# Patient Record
Sex: Female | Born: 1945 | Race: Black or African American | Hispanic: No | Marital: Married | Smoking: Former smoker
Health system: Southern US, Community
[De-identification: ages and names within clinical notes are randomized; demographics above are authoritative.]

## PROBLEM LIST (undated history)

## (undated) DIAGNOSIS — R17 Unspecified jaundice: Secondary | ICD-10-CM

## (undated) DIAGNOSIS — C250 Malignant neoplasm of head of pancreas: Secondary | ICD-10-CM

## (undated) DIAGNOSIS — C801 Malignant (primary) neoplasm, unspecified: Secondary | ICD-10-CM

## (undated) DIAGNOSIS — I10 Essential (primary) hypertension: Secondary | ICD-10-CM

## (undated) DIAGNOSIS — K219 Gastro-esophageal reflux disease without esophagitis: Secondary | ICD-10-CM

## (undated) DIAGNOSIS — D6481 Anemia due to antineoplastic chemotherapy: Secondary | ICD-10-CM

## (undated) DIAGNOSIS — E039 Hypothyroidism, unspecified: Secondary | ICD-10-CM

## (undated) DIAGNOSIS — Z972 Presence of dental prosthetic device (complete) (partial): Secondary | ICD-10-CM

## (undated) DIAGNOSIS — K831 Obstruction of bile duct: Secondary | ICD-10-CM

## (undated) DIAGNOSIS — Z4659 Encounter for fitting and adjustment of other gastrointestinal appliance and device: Secondary | ICD-10-CM

## (undated) HISTORY — DX: Obstruction of bile duct: K83.1

## (undated) HISTORY — DX: Malignant neoplasm of head of pancreas: C25.0

## (undated) HISTORY — DX: Unspecified jaundice: R17

## (undated) HISTORY — PX: CHOLECYSTECTOMY: SHX55

## (undated) HISTORY — DX: Malignant (primary) neoplasm, unspecified: C80.1

## (undated) HISTORY — DX: Gastro-esophageal reflux disease without esophagitis: K21.9

## (undated) HISTORY — DX: Encounter for fitting and adjustment of other gastrointestinal appliance and device: Z46.59

---
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
## 2008-02-10 ENCOUNTER — Ambulatory Visit: Payer: Self-pay | Admitting: Oral Surgery

## 2008-04-03 ENCOUNTER — Ambulatory Visit: Payer: Self-pay | Admitting: Internal Medicine

## 2008-11-17 ENCOUNTER — Emergency Department: Payer: Self-pay | Admitting: Unknown Physician Specialty

## 2008-11-23 ENCOUNTER — Ambulatory Visit: Payer: Self-pay | Admitting: Internal Medicine

## 2009-01-04 ENCOUNTER — Ambulatory Visit: Payer: Self-pay | Admitting: Urology

## 2009-01-08 ENCOUNTER — Ambulatory Visit: Payer: Self-pay | Admitting: Internal Medicine

## 2009-04-02 ENCOUNTER — Ambulatory Visit: Payer: Self-pay | Admitting: Gastroenterology

## 2009-04-13 ENCOUNTER — Ambulatory Visit: Payer: Self-pay | Admitting: Internal Medicine

## 2010-05-29 ENCOUNTER — Ambulatory Visit: Payer: Self-pay | Admitting: Internal Medicine

## 2010-12-06 ENCOUNTER — Ambulatory Visit: Payer: Self-pay | Admitting: Gastroenterology

## 2010-12-10 LAB — PATHOLOGY REPORT

## 2011-02-03 ENCOUNTER — Ambulatory Visit: Payer: Self-pay | Admitting: Internal Medicine

## 2011-06-18 ENCOUNTER — Ambulatory Visit: Payer: Self-pay | Admitting: Internal Medicine

## 2012-06-22 ENCOUNTER — Ambulatory Visit: Payer: Self-pay | Admitting: Internal Medicine

## 2013-06-27 ENCOUNTER — Ambulatory Visit: Payer: Self-pay

## 2014-07-14 ENCOUNTER — Ambulatory Visit: Payer: Self-pay | Admitting: Internal Medicine

## 2014-11-03 DIAGNOSIS — Z6834 Body mass index (BMI) 34.0-34.9, adult: Secondary | ICD-10-CM | POA: Diagnosis not present

## 2014-11-03 DIAGNOSIS — I10 Essential (primary) hypertension: Secondary | ICD-10-CM | POA: Diagnosis not present

## 2014-11-03 DIAGNOSIS — E6609 Other obesity due to excess calories: Secondary | ICD-10-CM | POA: Diagnosis not present

## 2014-12-01 DIAGNOSIS — Z6834 Body mass index (BMI) 34.0-34.9, adult: Secondary | ICD-10-CM | POA: Diagnosis not present

## 2014-12-01 DIAGNOSIS — E6609 Other obesity due to excess calories: Secondary | ICD-10-CM | POA: Diagnosis not present

## 2014-12-01 DIAGNOSIS — I10 Essential (primary) hypertension: Secondary | ICD-10-CM | POA: Diagnosis not present

## 2015-05-02 DIAGNOSIS — I10 Essential (primary) hypertension: Secondary | ICD-10-CM | POA: Diagnosis not present

## 2015-05-02 DIAGNOSIS — Z0001 Encounter for general adult medical examination with abnormal findings: Secondary | ICD-10-CM | POA: Diagnosis not present

## 2015-05-02 DIAGNOSIS — K219 Gastro-esophageal reflux disease without esophagitis: Secondary | ICD-10-CM | POA: Diagnosis not present

## 2015-05-02 DIAGNOSIS — E039 Hypothyroidism, unspecified: Secondary | ICD-10-CM | POA: Diagnosis not present

## 2015-05-25 DIAGNOSIS — I10 Essential (primary) hypertension: Secondary | ICD-10-CM | POA: Diagnosis not present

## 2015-05-25 DIAGNOSIS — Z0001 Encounter for general adult medical examination with abnormal findings: Secondary | ICD-10-CM | POA: Diagnosis not present

## 2015-05-25 DIAGNOSIS — E782 Mixed hyperlipidemia: Secondary | ICD-10-CM | POA: Diagnosis not present

## 2015-05-25 DIAGNOSIS — E559 Vitamin D deficiency, unspecified: Secondary | ICD-10-CM | POA: Diagnosis not present

## 2015-06-25 DIAGNOSIS — E890 Postprocedural hypothyroidism: Secondary | ICD-10-CM | POA: Diagnosis not present

## 2015-06-25 DIAGNOSIS — E0500 Thyrotoxicosis with diffuse goiter without thyrotoxic crisis or storm: Secondary | ICD-10-CM | POA: Diagnosis not present

## 2015-06-25 DIAGNOSIS — I10 Essential (primary) hypertension: Secondary | ICD-10-CM | POA: Diagnosis not present

## 2015-07-27 DIAGNOSIS — Z1231 Encounter for screening mammogram for malignant neoplasm of breast: Secondary | ICD-10-CM | POA: Diagnosis not present

## 2015-07-27 DIAGNOSIS — E2839 Other primary ovarian failure: Secondary | ICD-10-CM | POA: Diagnosis not present

## 2015-10-19 DIAGNOSIS — E0521 Thyrotoxicosis with toxic multinodular goiter with thyrotoxic crisis or storm: Secondary | ICD-10-CM | POA: Diagnosis not present

## 2015-10-19 DIAGNOSIS — E890 Postprocedural hypothyroidism: Secondary | ICD-10-CM | POA: Diagnosis not present

## 2015-10-26 DIAGNOSIS — E039 Hypothyroidism, unspecified: Secondary | ICD-10-CM | POA: Diagnosis not present

## 2015-10-26 DIAGNOSIS — E0521 Thyrotoxicosis with toxic multinodular goiter with thyrotoxic crisis or storm: Secondary | ICD-10-CM | POA: Diagnosis not present

## 2015-10-26 DIAGNOSIS — K219 Gastro-esophageal reflux disease without esophagitis: Secondary | ICD-10-CM | POA: Diagnosis not present

## 2015-10-26 DIAGNOSIS — I10 Essential (primary) hypertension: Secondary | ICD-10-CM | POA: Diagnosis not present

## 2015-10-26 DIAGNOSIS — D34 Benign neoplasm of thyroid gland: Secondary | ICD-10-CM | POA: Diagnosis not present

## 2015-10-26 DIAGNOSIS — E0500 Thyrotoxicosis with diffuse goiter without thyrotoxic crisis or storm: Secondary | ICD-10-CM | POA: Diagnosis not present

## 2015-10-26 DIAGNOSIS — Z0001 Encounter for general adult medical examination with abnormal findings: Secondary | ICD-10-CM | POA: Diagnosis not present

## 2016-04-05 DIAGNOSIS — H2513 Age-related nuclear cataract, bilateral: Secondary | ICD-10-CM | POA: Diagnosis not present

## 2016-04-05 DIAGNOSIS — H43391 Other vitreous opacities, right eye: Secondary | ICD-10-CM | POA: Diagnosis not present

## 2016-04-05 DIAGNOSIS — H524 Presbyopia: Secondary | ICD-10-CM | POA: Diagnosis not present

## 2016-04-05 DIAGNOSIS — H43812 Vitreous degeneration, left eye: Secondary | ICD-10-CM | POA: Diagnosis not present

## 2016-06-06 DIAGNOSIS — E039 Hypothyroidism, unspecified: Secondary | ICD-10-CM | POA: Diagnosis not present

## 2016-06-06 DIAGNOSIS — I10 Essential (primary) hypertension: Secondary | ICD-10-CM | POA: Diagnosis not present

## 2016-06-06 DIAGNOSIS — K219 Gastro-esophageal reflux disease without esophagitis: Secondary | ICD-10-CM | POA: Diagnosis not present

## 2016-06-10 ENCOUNTER — Other Ambulatory Visit: Payer: Self-pay | Admitting: Nurse Practitioner

## 2016-06-10 DIAGNOSIS — Z1231 Encounter for screening mammogram for malignant neoplasm of breast: Secondary | ICD-10-CM

## 2016-07-01 DIAGNOSIS — E559 Vitamin D deficiency, unspecified: Secondary | ICD-10-CM | POA: Diagnosis not present

## 2016-07-01 DIAGNOSIS — I10 Essential (primary) hypertension: Secondary | ICD-10-CM | POA: Diagnosis not present

## 2016-07-01 DIAGNOSIS — Z0001 Encounter for general adult medical examination with abnormal findings: Secondary | ICD-10-CM | POA: Diagnosis not present

## 2016-07-29 ENCOUNTER — Ambulatory Visit
Admission: RE | Admit: 2016-07-29 | Discharge: 2016-07-29 | Disposition: A | Discharge status: Home / Self Care | Payer: Medicare Other | Source: Ambulatory Visit | Attending: Nurse Practitioner | Admitting: Nurse Practitioner

## 2016-07-29 ENCOUNTER — Encounter: Payer: Self-pay | Admitting: Radiology

## 2016-07-29 DIAGNOSIS — N6489 Other specified disorders of breast: Secondary | ICD-10-CM | POA: Insufficient documentation

## 2016-07-29 DIAGNOSIS — Z1231 Encounter for screening mammogram for malignant neoplasm of breast: Secondary | ICD-10-CM

## 2016-08-13 DIAGNOSIS — Z86010 Personal history of colonic polyps: Secondary | ICD-10-CM | POA: Diagnosis not present

## 2016-10-17 DIAGNOSIS — I10 Essential (primary) hypertension: Secondary | ICD-10-CM | POA: Diagnosis not present

## 2016-10-17 DIAGNOSIS — E0521 Thyrotoxicosis with toxic multinodular goiter with thyrotoxic crisis or storm: Secondary | ICD-10-CM | POA: Diagnosis not present

## 2016-10-17 DIAGNOSIS — E559 Vitamin D deficiency, unspecified: Secondary | ICD-10-CM | POA: Diagnosis not present

## 2016-10-17 DIAGNOSIS — D34 Benign neoplasm of thyroid gland: Secondary | ICD-10-CM | POA: Diagnosis not present

## 2016-10-17 DIAGNOSIS — E042 Nontoxic multinodular goiter: Secondary | ICD-10-CM | POA: Diagnosis not present

## 2016-10-17 DIAGNOSIS — E890 Postprocedural hypothyroidism: Secondary | ICD-10-CM | POA: Diagnosis not present

## 2016-10-30 DIAGNOSIS — D34 Benign neoplasm of thyroid gland: Secondary | ICD-10-CM | POA: Diagnosis not present

## 2016-10-30 DIAGNOSIS — I10 Essential (primary) hypertension: Secondary | ICD-10-CM | POA: Diagnosis not present

## 2016-10-30 DIAGNOSIS — E890 Postprocedural hypothyroidism: Secondary | ICD-10-CM | POA: Diagnosis not present

## 2016-11-20 DIAGNOSIS — E039 Hypothyroidism, unspecified: Secondary | ICD-10-CM | POA: Diagnosis not present

## 2016-11-20 DIAGNOSIS — N390 Urinary tract infection, site not specified: Secondary | ICD-10-CM | POA: Diagnosis not present

## 2016-11-20 DIAGNOSIS — I10 Essential (primary) hypertension: Secondary | ICD-10-CM | POA: Diagnosis not present

## 2016-11-20 DIAGNOSIS — Z0001 Encounter for general adult medical examination with abnormal findings: Secondary | ICD-10-CM | POA: Diagnosis not present

## 2016-11-20 DIAGNOSIS — K219 Gastro-esophageal reflux disease without esophagitis: Secondary | ICD-10-CM | POA: Diagnosis not present

## 2016-12-01 ENCOUNTER — Encounter: Payer: Self-pay | Admitting: *Deleted

## 2016-12-02 ENCOUNTER — Ambulatory Visit
Admission: RE | Admit: 2016-12-02 | Discharge: 2016-12-02 | Disposition: A | Discharge status: Home / Self Care | Payer: Medicare Other | Source: Ambulatory Visit | Attending: Gastroenterology | Admitting: Gastroenterology

## 2016-12-02 ENCOUNTER — Encounter: Payer: Self-pay | Admitting: *Deleted

## 2016-12-02 ENCOUNTER — Ambulatory Visit: Payer: Medicare Other | Admitting: Anesthesiology

## 2016-12-02 ENCOUNTER — Encounter
Admission: RE | Disposition: A | Discharge status: Home / Self Care | Payer: Self-pay | Source: Ambulatory Visit | Attending: Gastroenterology

## 2016-12-02 DIAGNOSIS — D123 Benign neoplasm of transverse colon: Secondary | ICD-10-CM | POA: Diagnosis not present

## 2016-12-02 DIAGNOSIS — E039 Hypothyroidism, unspecified: Secondary | ICD-10-CM | POA: Diagnosis not present

## 2016-12-02 DIAGNOSIS — K640 First degree hemorrhoids: Secondary | ICD-10-CM | POA: Diagnosis not present

## 2016-12-02 DIAGNOSIS — Z7982 Long term (current) use of aspirin: Secondary | ICD-10-CM | POA: Diagnosis not present

## 2016-12-02 DIAGNOSIS — D124 Benign neoplasm of descending colon: Secondary | ICD-10-CM | POA: Diagnosis not present

## 2016-12-02 DIAGNOSIS — Z1211 Encounter for screening for malignant neoplasm of colon: Secondary | ICD-10-CM | POA: Insufficient documentation

## 2016-12-02 DIAGNOSIS — K5730 Diverticulosis of large intestine without perforation or abscess without bleeding: Secondary | ICD-10-CM | POA: Insufficient documentation

## 2016-12-02 DIAGNOSIS — Z79899 Other long term (current) drug therapy: Secondary | ICD-10-CM | POA: Insufficient documentation

## 2016-12-02 DIAGNOSIS — Z86010 Personal history of colonic polyps: Secondary | ICD-10-CM | POA: Diagnosis not present

## 2016-12-02 DIAGNOSIS — I10 Essential (primary) hypertension: Secondary | ICD-10-CM | POA: Diagnosis not present

## 2016-12-02 DIAGNOSIS — K648 Other hemorrhoids: Secondary | ICD-10-CM | POA: Diagnosis not present

## 2016-12-02 DIAGNOSIS — K635 Polyp of colon: Secondary | ICD-10-CM | POA: Diagnosis not present

## 2016-12-02 DIAGNOSIS — K5790 Diverticulosis of intestine, part unspecified, without perforation or abscess without bleeding: Secondary | ICD-10-CM | POA: Diagnosis not present

## 2016-12-02 HISTORY — DX: Hypothyroidism, unspecified: E03.9

## 2016-12-02 HISTORY — PX: COLONOSCOPY: SHX5424

## 2016-12-02 HISTORY — DX: Essential (primary) hypertension: I10

## 2016-12-02 SURGERY — COLONOSCOPY
Anesthesia: General

## 2016-12-02 MED ORDER — SODIUM CHLORIDE 0.9 % IV SOLN
INTRAVENOUS | Status: DC
  Administered 2016-12-02: 09:00:00 via INTRAVENOUS
  Administered 2016-12-02: 1000 mL via INTRAVENOUS

## 2016-12-02 MED ORDER — PROPOFOL 500 MG/50ML IV EMUL
INTRAVENOUS | Status: AC
  Filled 2016-12-02: qty 50

## 2016-12-02 MED ORDER — MIDAZOLAM HCL 5 MG/5ML IJ SOLN
INTRAMUSCULAR | Status: DC | PRN
  Administered 2016-12-02: 1 mg via INTRAVENOUS

## 2016-12-02 MED ORDER — PROPOFOL 500 MG/50ML IV EMUL
INTRAVENOUS | Status: DC | PRN
  Administered 2016-12-02: 140 ug/kg/min via INTRAVENOUS

## 2016-12-02 MED ORDER — PROPOFOL 10 MG/ML IV BOLUS
INTRAVENOUS | Status: AC
  Filled 2016-12-02: qty 20

## 2016-12-02 MED ORDER — FENTANYL CITRATE (PF) 100 MCG/2ML IJ SOLN
INTRAMUSCULAR | Status: AC
  Filled 2016-12-02: qty 2

## 2016-12-02 MED ORDER — FENTANYL CITRATE (PF) 100 MCG/2ML IJ SOLN
INTRAMUSCULAR | Status: DC | PRN
  Administered 2016-12-02: 50 ug via INTRAVENOUS

## 2016-12-02 MED ORDER — SODIUM CHLORIDE 0.9 % IV SOLN: INTRAVENOUS | Status: DC

## 2016-12-02 MED ORDER — LIDOCAINE 2% (20 MG/ML) 5 ML SYRINGE
INTRAMUSCULAR | Status: DC | PRN
  Administered 2016-12-02: 40 mg via INTRAVENOUS

## 2016-12-02 MED ORDER — PROPOFOL 10 MG/ML IV BOLUS
INTRAVENOUS | Status: DC | PRN
  Administered 2016-12-02: 100 mg via INTRAVENOUS

## 2016-12-02 MED ORDER — MIDAZOLAM HCL 2 MG/2ML IJ SOLN
INTRAMUSCULAR | Status: AC
  Filled 2016-12-02: qty 2

## 2016-12-02 NOTE — Op Note (Signed)
[facility] Gastroenterology Patient Name: Lynn Taylor Procedure Date: 12/02/2016 9:37 AM MRN: [id number] Account #: [id number] Date of Birth: [birth year] Admit Type: Outpatient Age: 71 Room: [facility] ENDO ROOM 3 Gender: Female Note Status: Finalized Procedure:            Colonoscopy Indications:          Personal history of colonic polyps Providers:            Lollie Sails, MD Referring MD:         Lavera Guise, MD (Referring MD) Medicines:            Monitored Anesthesia Care Complications:        No immediate complications. Procedure:            Pre-Anesthesia Assessment:                       - ASA Grade Assessment: III - A patient with severe                        systemic disease.                       After obtaining informed consent, the colonoscope was                        passed under direct vision. Throughout the procedure,                        the patient's blood pressure, pulse, and oxygen                        saturations were monitored continuously. The Olympus                        PCF-H180AL colonoscope ( S#: [id number] ) was introduced                        through the anus and advanced to the the cecum,                        identified by appendiceal orifice and ileocecal valve.                        The colonoscopy was performed without difficulty. The                        patient tolerated the procedure well. The quality of                        the bowel preparation was good. Findings:      A 2 mm polyp was found in the splenic flexure. The polyp was sessile.       The polyp was removed with a cold biopsy forceps. Resection and       retrieval were complete.      A 5 mm polyp was found in the descending colon. The polyp was sessile.       The polyp was removed with a cold snare. Resection and retrieval were       complete.      Multiple small-mouthed diverticula were found in the sigmoid colon,  descending colon,  transverse colon and ascending colon.      Non-bleeding internal hemorrhoids were found during retroflexion. The       hemorrhoids were small and Grade I (internal hemorrhoids that do not       prolapse).      The digital rectal exam was normal. Impression:           - One 2 mm polyp at the splenic flexure, removed with a                        cold biopsy forceps. Resected and retrieved.                       - One 5 mm polyp in the descending colon, removed with                        a cold snare. Resected and retrieved.                       - Diverticulosis in the sigmoid colon, in the                        descending colon, in the transverse colon and in the                        ascending colon.                       - Non-bleeding internal hemorrhoids. Recommendation:       - Await pathology results.                       - Telephone GI clinic for pathology results in 1 week. Procedure Code(s):    --- Professional ---                       [phone], Colonoscopy, flexible; with removal of tumor(s),                        polyp(s), or other lesion(s) by snare technique                       45380, 107, Colonoscopy, flexible; with biopsy, single                        or multiple Diagnosis Code(s):    --- Professional ---                       D12.3, Benign neoplasm of transverse colon (hepatic                        flexure or splenic flexure)                       D12.4, Benign neoplasm of descending colon                       K64.0, First degree hemorrhoids                       Z86.010, Personal history of colonic polyps  K57.30, Diverticulosis of large intestine without                        perforation or abscess without bleeding CPT copyright 2016 American Medical Association. All rights reserved. The codes documented in this report are preliminary and upon coder review may  be revised to meet current compliance requirements. Lollie Sails,  MD 12/02/2016 10:18:07 AM This report has been signed electronically. Number of Addenda: 0 Note Initiated On: 12/02/2016 9:37 AM Scope Withdrawal Time: 0 hours 14 minutes 30 seconds  Total Procedure Duration: 0 hours 24 minutes 32 seconds       [facility]

## 2016-12-02 NOTE — Anesthesia Preprocedure Evaluation (Signed)
Anesthesia Evaluation  Patient identified by MRN, date of birth, ID band Patient awake    Reviewed: Allergy & Precautions, H&P , NPO status , Patient's Chart, lab work & pertinent test results  History of Anesthesia Complications Negative for: history of anesthetic complications  Airway Mallampati: III  TM Distance: >3 FB Neck ROM: limited    Dental  (+) Poor Dentition, Missing, Upper Dentures, Lower Dentures   Pulmonary neg pulmonary ROS, neg shortness of breath,           Cardiovascular Exercise Tolerance: Good hypertension, (-) angina(-) Past MI and (-) DOE      Neuro/Psych negative neurological ROS  negative psych ROS   GI/Hepatic negative GI ROS, Neg liver ROS, neg GERD  ,  Endo/Other  Hypothyroidism   Renal/GU negative Renal ROS  negative genitourinary   Musculoskeletal   Abdominal   Peds  Hematology negative hematology ROS (+)   Anesthesia Other Findings Past Medical History: No date: Hypertension No date: Hypothyroidism  Past Surgical History: No date: CHOLECYSTECTOMY  BMI    Body Mass Index:  33.30 kg/m      Reproductive/Obstetrics negative OB ROS                             Anesthesia Physical Anesthesia Plan  ASA: III  Anesthesia Plan: General   Post-op Pain Management:    Induction: Intravenous  PONV Risk Score and Plan: 3 and Ondansetron, Dexamethasone, Propofol and Midazolam  Airway Management Planned: Natural Airway and Nasal Cannula  Additional Equipment:   Intra-op Plan:   Post-operative Plan:   Informed Consent: I have reviewed the patients History and Physical, chart, labs and discussed the procedure including the risks, benefits and alternatives for the proposed anesthesia with the patient or authorized representative who has indicated his/her understanding and acceptance.   Dental Advisory Given  Plan Discussed with: Anesthesiologist, CRNA  and Surgeon  Anesthesia Plan Comments: (Patient consented for risks of anesthesia including but not limited to:  - adverse reactions to medications - damage to teeth, lips or other oral mucosa - sore throat or hoarseness - Damage to heart, brain, lungs or loss of life  Patient voiced understanding.)        Anesthesia Quick Evaluation

## 2016-12-02 NOTE — H&P (Signed)
Outpatient short stay form Pre-procedure 12/02/2016 8:34 AM Lollie Sails MD  Primary Physician: Dr Clayborn Bigness  Reason for visit:  Colonoscopy  History of present illness:  Patient is a 71 year old female presenting today as above. She has personal history of adenomatous colon polyps with her last procedure being done about 5 years ago. She does take a daily aspirin 81 mg however has held that for about a week. She takes no other aspirin products or blood thinning agents. She tolerated her prep although she did throw up a little bit of it at the end of it. She states she is going clear this morning.    Current Facility-Administered Medications:  .  0.9 %  sodium chloride infusion, , Intravenous, Continuous, Lollie Sails, MD .  0.9 %  sodium chloride infusion, , Intravenous, Continuous, Lollie Sails, MD  Prescriptions Prior to Admission  Medication Sig Dispense Refill Last Dose  . aspirin EC 81 MG tablet Take 81 mg by mouth daily.   Past Week at Unknown time  . Cholecalciferol 1000 units capsule Take 1,000 Units by mouth daily.     . hydrochlorothiazide (MICROZIDE) 12.5 MG capsule Take 12.5 mg by mouth daily.   12/02/2016 at 0700  . levothyroxine (SYNTHROID, LEVOTHROID) 100 MCG tablet Take 100 mcg by mouth daily before breakfast.   12/02/2016 at 0700  . losartan (COZAAR) 25 MG tablet Take 25 mg by mouth daily.   12/02/2016 at 0700  . omeprazole (PRILOSEC) 20 MG capsule Take 20 mg by mouth daily.        No Known Allergies   Past Medical History:  Diagnosis Date  . Hypertension   . Hypothyroidism     Review of systems:      Physical Exam    Heart and lungs: Regular rate and rhythm without rub or gallop, lungs are bilaterally clear.    HEENT: Normocephalic atraumatic eyes are anicteric    Other:     Pertinant exam for procedure: Soft nontender nondistended bowel sounds positive normoactive.    Planned proceedures: Colonoscopy and indicated procedures. I have  discussed the risks benefits and complications of procedures to include not limited to bleeding, infection, perforation and the risk of sedation and the patient wishes to proceed.    Lollie Sails, MD Gastroenterology 12/02/2016  8:34 AM

## 2016-12-02 NOTE — Anesthesia Post-op Follow-up Note (Cosign Needed)
Anesthesia QCDR form completed.        

## 2016-12-02 NOTE — Transfer of Care (Signed)
Immediate Anesthesia Transfer of Care Note  Patient: Lynn Taylor  Procedure(s) Performed: Procedure(s): COLONOSCOPY (N/A)  Patient Location: PACU and Endoscopy Unit  Anesthesia Type:General  Level of Consciousness: sedated  Airway & Oxygen Therapy: Patient Spontanous Breathing and Patient connected to nasal cannula oxygen  Post-op Assessment: Report given to RN and Post -op Vital signs reviewed and stable  Post vital signs: Reviewed and stable  Last Vitals:  Vitals:   12/02/16 0813  BP: 137/60  Pulse: 72  Resp: 18  Temp: 36.3 C    Last Pain:  Vitals:   12/02/16 0813  TempSrc: Tympanic         Complications: No apparent anesthesia complications

## 2016-12-03 ENCOUNTER — Encounter: Payer: Self-pay | Admitting: Gastroenterology

## 2016-12-03 LAB — SURGICAL PATHOLOGY

## 2016-12-03 NOTE — Anesthesia Postprocedure Evaluation (Signed)
Anesthesia Post Note  Patient: Lynn Taylor  Procedure(s) Performed: Procedure(s) (LRB): COLONOSCOPY (N/A)  Patient location during evaluation: Endoscopy Anesthesia Type: General Level of consciousness: awake and alert Pain management: pain level controlled Vital Signs Assessment: post-procedure vital signs reviewed and stable Respiratory status: spontaneous breathing, nonlabored ventilation, respiratory function stable and patient connected to nasal cannula oxygen Cardiovascular status: blood pressure returned to baseline and stable Postop Assessment: no signs of nausea or vomiting Anesthetic complications: no     Last Vitals:  Vitals:   12/02/16 1042 12/02/16 1050  BP: 111/74 125/68  Pulse: 70 67  Resp: 14 12  Temp:      Last Pain:  Vitals:   12/03/16 0746  TempSrc:   PainSc: 0-No pain                 Precious Haws Amato [place]

## 2017-05-20 ENCOUNTER — Ambulatory Visit (INDEPENDENT_AMBULATORY_CARE_PROVIDER_SITE_OTHER): Payer: Medicare Other | Admitting: Nurse Practitioner

## 2017-05-20 ENCOUNTER — Encounter: Payer: Self-pay | Admitting: Nurse Practitioner

## 2017-05-20 VITALS — BP 132/80 | HR 64 | Resp 16 | Ht 63.0 in | Wt 190.2 lb

## 2017-05-20 DIAGNOSIS — E039 Hypothyroidism, unspecified: Secondary | ICD-10-CM | POA: Diagnosis not present

## 2017-05-20 DIAGNOSIS — I10 Essential (primary) hypertension: Secondary | ICD-10-CM

## 2017-05-20 DIAGNOSIS — Z1159 Encounter for screening for other viral diseases: Secondary | ICD-10-CM

## 2017-05-20 DIAGNOSIS — Z1231 Encounter for screening mammogram for malignant neoplasm of breast: Secondary | ICD-10-CM

## 2017-05-20 DIAGNOSIS — E559 Vitamin D deficiency, unspecified: Secondary | ICD-10-CM | POA: Insufficient documentation

## 2017-05-20 DIAGNOSIS — Z1239 Encounter for other screening for malignant neoplasm of breast: Secondary | ICD-10-CM

## 2017-05-20 MED ORDER — LOSARTAN POTASSIUM-HCTZ 50-12.5 MG PO TABS: 1.0000 | ORAL_TABLET | Freq: Every day | ORAL | 5 refills | Status: DC

## 2017-05-20 MED ORDER — LEVOTHYROXINE SODIUM 100 MCG PO TABS: 100.0000 ug | ORAL_TABLET | Freq: Every day | ORAL | 5 refills | Status: DC

## 2017-05-20 MED ORDER — LOSARTAN POTASSIUM 25 MG PO TABS: 25.0000 mg | ORAL_TABLET | Freq: Every day | ORAL | 5 refills | Status: DC

## 2017-05-20 NOTE — Progress Notes (Signed)
Subjective:     Patient ID: Lynn Taylor, female   DOB: 06-19-1945, 71 y.o.   MRN: [id number]    Assessment: See encounter diagnosis Discussion: normal blood pressure Cardiovascular risk factors: advanced age (older than 67 for men, 67 for women)   Plan: Continue current medications. Patient Education: Reviewed risks of hypertension and principles of  treatment. Counseling time: counseling time more than 50% of visit: 15 minutes.      Review of Systems  Constitutional: Negative for activity change, appetite change, fatigue, fever and unexpected weight change.  HENT: Negative.   Respiratory: Negative.   Cardiovascular: Negative.   Gastrointestinal: Negative.   Endocrine: Negative.   Musculoskeletal: Negative.   Skin: Negative.   Allergic/Immunologic: Negative.   Neurological: Negative.   Hematological: Negative.   Psychiatric/Behavioral: Negative.        Objective:   Physical Exam  Constitutional: She is oriented to person, place, and time. She appears well-developed.  HENT:  Head: Normocephalic and atraumatic.  Neck: Neck supple. No tracheal deviation present. No thyromegaly present.  Cardiovascular: Normal rate and regular rhythm.  Pulmonary/Chest: Breath sounds normal. No respiratory distress. She has no wheezes. She has no rales. She exhibits no tenderness.  Abdominal: Soft. Bowel sounds are normal.  Musculoskeletal: Normal range of motion.  Neurological: She is alert and oriented to person, place, and time.  Skin: Skin is warm and dry.  Psychiatric: She has a normal mood and affect.       Assessment:     Hypertension Hypothyroid Vitamin d deficiency    Plan:   Continue meds as prescribed Check routine, fasting labs, include Vitamin d, and Hep C screening Mammogram

## 2017-06-09 DIAGNOSIS — Z9221 Personal history of antineoplastic chemotherapy: Secondary | ICD-10-CM

## 2017-06-09 HISTORY — DX: Personal history of antineoplastic chemotherapy: Z92.21

## 2017-07-01 ENCOUNTER — Other Ambulatory Visit: Payer: Self-pay

## 2017-07-01 DIAGNOSIS — I10 Essential (primary) hypertension: Secondary | ICD-10-CM

## 2017-07-01 MED ORDER — LOSARTAN POTASSIUM-HCTZ 50-12.5 MG PO TABS: 1.0000 | ORAL_TABLET | Freq: Every day | ORAL | 1 refills | Status: DC

## 2017-07-15 ENCOUNTER — Other Ambulatory Visit: Payer: Self-pay | Admitting: Internal Medicine

## 2017-07-15 DIAGNOSIS — Z1231 Encounter for screening mammogram for malignant neoplasm of breast: Secondary | ICD-10-CM

## 2017-07-31 ENCOUNTER — Ambulatory Visit
Admission: RE | Admit: 2017-07-31 | Discharge: 2017-07-31 | Disposition: A | Discharge status: Home / Self Care | Payer: Medicare Other | Source: Ambulatory Visit | Attending: Internal Medicine | Admitting: Internal Medicine

## 2017-07-31 DIAGNOSIS — Z1231 Encounter for screening mammogram for malignant neoplasm of breast: Secondary | ICD-10-CM | POA: Insufficient documentation

## 2017-07-31 DIAGNOSIS — R928 Other abnormal and inconclusive findings on diagnostic imaging of breast: Secondary | ICD-10-CM | POA: Insufficient documentation

## 2017-08-06 ENCOUNTER — Other Ambulatory Visit: Payer: Self-pay | Admitting: Internal Medicine

## 2017-08-06 DIAGNOSIS — R928 Other abnormal and inconclusive findings on diagnostic imaging of breast: Secondary | ICD-10-CM

## 2017-08-06 DIAGNOSIS — N6310 Unspecified lump in the right breast, unspecified quadrant: Secondary | ICD-10-CM

## 2017-08-10 ENCOUNTER — Ambulatory Visit
Admission: RE | Admit: 2017-08-10 | Discharge: 2017-08-10 | Disposition: A | Discharge status: Home / Self Care | Payer: Medicare Other | Source: Ambulatory Visit | Attending: Internal Medicine | Admitting: Internal Medicine

## 2017-08-10 DIAGNOSIS — R928 Other abnormal and inconclusive findings on diagnostic imaging of breast: Secondary | ICD-10-CM | POA: Insufficient documentation

## 2017-08-10 DIAGNOSIS — N6001 Solitary cyst of right breast: Secondary | ICD-10-CM | POA: Diagnosis not present

## 2017-08-10 DIAGNOSIS — N6310 Unspecified lump in the right breast, unspecified quadrant: Secondary | ICD-10-CM | POA: Diagnosis not present

## 2017-10-26 DIAGNOSIS — E890 Postprocedural hypothyroidism: Secondary | ICD-10-CM | POA: Diagnosis not present

## 2017-10-26 DIAGNOSIS — E785 Hyperlipidemia, unspecified: Secondary | ICD-10-CM | POA: Diagnosis not present

## 2017-10-26 DIAGNOSIS — E559 Vitamin D deficiency, unspecified: Secondary | ICD-10-CM | POA: Diagnosis not present

## 2017-10-26 DIAGNOSIS — E0521 Thyrotoxicosis with toxic multinodular goiter with thyrotoxic crisis or storm: Secondary | ICD-10-CM | POA: Diagnosis not present

## 2017-10-26 DIAGNOSIS — I10 Essential (primary) hypertension: Secondary | ICD-10-CM | POA: Diagnosis not present

## 2017-10-26 DIAGNOSIS — E042 Nontoxic multinodular goiter: Secondary | ICD-10-CM | POA: Diagnosis not present

## 2017-11-03 DIAGNOSIS — E890 Postprocedural hypothyroidism: Secondary | ICD-10-CM | POA: Diagnosis not present

## 2017-11-03 DIAGNOSIS — E559 Vitamin D deficiency, unspecified: Secondary | ICD-10-CM | POA: Diagnosis not present

## 2017-11-03 DIAGNOSIS — I10 Essential (primary) hypertension: Secondary | ICD-10-CM | POA: Diagnosis not present

## 2017-11-03 DIAGNOSIS — E785 Hyperlipidemia, unspecified: Secondary | ICD-10-CM | POA: Diagnosis not present

## 2017-11-12 ENCOUNTER — Other Ambulatory Visit: Payer: Self-pay | Admitting: Nurse Practitioner

## 2017-11-12 DIAGNOSIS — Z0001 Encounter for general adult medical examination with abnormal findings: Secondary | ICD-10-CM | POA: Diagnosis not present

## 2017-11-12 DIAGNOSIS — I10 Essential (primary) hypertension: Secondary | ICD-10-CM | POA: Diagnosis not present

## 2017-11-12 DIAGNOSIS — E559 Vitamin D deficiency, unspecified: Secondary | ICD-10-CM | POA: Diagnosis not present

## 2017-11-12 DIAGNOSIS — E782 Mixed hyperlipidemia: Secondary | ICD-10-CM | POA: Diagnosis not present

## 2017-11-13 LAB — COMPREHENSIVE METABOLIC PANEL
ALT: 10 IU/L (ref 0–32)
AST: 12 IU/L (ref 0–40)
Albumin/Globulin Ratio: 1.4 (ref 1.2–2.2)
Albumin: 4.5 g/dL (ref 3.5–4.8)
Alkaline Phosphatase: 48 IU/L (ref 39–117)
BUN/Creatinine Ratio: 26 (ref 12–28)
BUN: 25 mg/dL (ref 8–27)
Bilirubin Total: 0.6 mg/dL (ref 0.0–1.2)
CO2: 25 mmol/L (ref 20–29)
Calcium: 10 mg/dL (ref 8.7–10.3)
Chloride: 97 mmol/L (ref 96–106)
Creatinine, Ser: 0.95 mg/dL (ref 0.57–1.00)
GFR calc Af Amer: 70 mL/min/{1.73_m2} (ref >59)
GFR calc non Af Amer: 60 mL/min/{1.73_m2} (ref >59)
Globulin, Total: 3.2 g/dL (ref 1.5–4.5)
Glucose: 97 mg/dL (ref 65–99)
Potassium: 3.2 mmol/L — ABNORMAL LOW (ref 3.5–5.2)
Sodium: 137 mmol/L (ref 134–144)
Total Protein: 7.7 g/dL (ref 6.0–8.5)

## 2017-11-13 LAB — CBC
Hematocrit: 38.5 % (ref 34.0–46.6)
Hemoglobin: 13 g/dL (ref 11.1–15.9)
MCH: 28.4 pg (ref 26.6–33.0)
MCHC: 33.8 g/dL (ref 31.5–35.7)
MCV: 84 fL (ref 79–97)
Platelets: 228 10*3/uL (ref 150–450)
RBC: 4.57 x10E6/uL (ref 3.77–5.28)
RDW: 14.9 % (ref 12.3–15.4)
WBC: 6.8 10*3/uL (ref 3.4–10.8)

## 2017-11-13 LAB — TSH: TSH: 1.62 u[IU]/mL (ref 0.450–4.500)

## 2017-11-13 LAB — HEPATITIS C ANTIBODY (REFLEX): HCV Ab: <0.1 s/co ratio (ref 0.0–0.9)

## 2017-11-13 LAB — VITAMIN D 25 HYDROXY (VIT D DEFICIENCY, FRACTURES): Vit D, 25-Hydroxy: 40.4 ng/mL (ref 30.0–100.0)

## 2017-11-13 LAB — LIPID PANEL W/O CHOL/HDL RATIO
Cholesterol, Total: 205 mg/dL — ABNORMAL HIGH (ref 100–199)
HDL: 75 mg/dL (ref >39)
LDL Calculated: 114 mg/dL — ABNORMAL HIGH (ref 0–99)
Triglycerides: 81 mg/dL (ref 0–149)
VLDL Cholesterol Cal: 16 mg/dL (ref 5–40)

## 2017-11-13 LAB — HCV COMMENT:

## 2017-11-13 LAB — T4, FREE: Free T4: 1.47 ng/dL (ref 0.82–1.77)

## 2017-11-19 ENCOUNTER — Ambulatory Visit (INDEPENDENT_AMBULATORY_CARE_PROVIDER_SITE_OTHER): Payer: Medicare Other | Admitting: Nurse Practitioner

## 2017-11-19 ENCOUNTER — Encounter: Payer: Self-pay | Admitting: Nurse Practitioner

## 2017-11-19 VITALS — BP 136/67 | HR 68 | Resp 16 | Ht 63.3 in | Wt 194.0 lb

## 2017-11-19 DIAGNOSIS — E039 Hypothyroidism, unspecified: Secondary | ICD-10-CM | POA: Diagnosis not present

## 2017-11-19 DIAGNOSIS — I10 Essential (primary) hypertension: Secondary | ICD-10-CM

## 2017-11-19 MED ORDER — LEVOTHYROXINE SODIUM 100 MCG PO TABS: 100.0000 ug | ORAL_TABLET | Freq: Every day | ORAL | 4 refills | Status: DC

## 2017-11-19 MED ORDER — LOSARTAN POTASSIUM-HCTZ 50-12.5 MG PO TABS: 1.0000 | ORAL_TABLET | Freq: Every day | ORAL | 4 refills | Status: DC

## 2017-11-19 MED ORDER — LOSARTAN POTASSIUM 25 MG PO TABS: 25.0000 mg | ORAL_TABLET | Freq: Every day | ORAL | 4 refills | Status: DC

## 2017-11-19 NOTE — Progress Notes (Signed)
[facility] [place], [place] [address]  Internal MEDICINE  Office Visit Note  Patient Name: Lynn Taylor  [id number]  [id number]  Date of Service: 11/19/2017  Chief Complaint  Patient presents with  . Hypertension    5month follow up    Hypertension  This is a chronic problem. The current episode started more than 1 year ago. The problem is unchanged. The problem is controlled. Pertinent negatives include no chest pain, headaches, neck pain, palpitations or shortness of breath. Agents associated with hypertension include thyroid hormones. Risk factors for coronary artery disease include post-menopausal state. Past treatments include ACE inhibitors and diuretics. There are no compliance problems.     Pt is here for routine follow up.    Current Medication: Outpatient Encounter Medications as of 11/19/2017  Medication Sig  . aspirin EC 81 MG tablet Take 81 mg by mouth daily.  . Cholecalciferol 1000 units capsule Take 1,000 Units by mouth daily.  Marland Kitchen levothyroxine (SYNTHROID, LEVOTHROID) 100 MCG tablet Take 1 tablet (100 mcg total) by mouth daily before breakfast.  . losartan (COZAAR) 25 MG tablet Take 1 tablet (25 mg total) by mouth daily.  Marland Kitchen losartan-hydrochlorothiazide (HYZAAR) 50-12.5 MG tablet Take 1 tablet by mouth daily.  . [DISCONTINUED] levothyroxine (SYNTHROID, LEVOTHROID) 100 MCG tablet Take 1 tablet (100 mcg total) by mouth daily before breakfast.  . [DISCONTINUED] losartan (COZAAR) 25 MG tablet Take 1 tablet (25 mg total) by mouth daily.  . [DISCONTINUED] losartan-hydrochlorothiazide (HYZAAR) 50-12.5 MG tablet Take 1 tablet by mouth daily.  Marland Kitchen omeprazole (PRILOSEC) 20 MG capsule Take 20 mg by mouth daily.  . [DISCONTINUED] hydrochlorothiazide (MICROZIDE) 12.5 MG capsule Take 12.5 mg by mouth daily.   No facility-administered encounter medications on file as of 11/19/2017.     Surgical History: Past Surgical History:  Procedure Laterality Date   . CHOLECYSTECTOMY    . COLONOSCOPY N/A 12/02/2016   Procedure: COLONOSCOPY;  Surgeon: Lollie Sails, MD;  Location: [facility] ENDOSCOPY;  Service: Endoscopy;  Laterality: N/A;    Medical History: Past Medical History:  Diagnosis Date  . GERD (gastroesophageal reflux disease)   . Hypertension   . Hypothyroidism     Family History: Family History  Problem Relation Age of Onset  . Diabetes Mellitus I Other   . Alcoholism Other   . Hypertension Other   . Hyperlipidemia Other   . Coronary artery disease Other   . Stroke Other   . Osteoarthritis Other   . Migraines Other   . Breast cancer Neg Hx     Social History   Socioeconomic History  . Marital status: Married    Spouse name: Not on file  . Number of children: Not on file  . Years of education: Not on file  . Highest education level: Not on file  Occupational History  . Not on file  Social Needs  . Financial resource strain: Not on file  . Food insecurity:    Worry: Not on file    Inability: Not on file  . Transportation needs:    Medical: Not on file    Non-medical: Not on file  Tobacco Use  . Smoking status: Never Smoker  . Smokeless tobacco: Never Used  Substance and Sexual Activity  . Alcohol use: Yes    Comment: social drinker  . Drug use: No  . Sexual activity: Not on file  Lifestyle  . Physical activity:    Days per week: Not on file  Minutes per session: Not on file  . Stress: Not on file  Relationships  . Social connections:    Talks on phone: Not on file    Gets together: Not on file    Attends religious service: Not on file    Active member of club or organization: Not on file    Attends meetings of clubs or organizations: Not on file    Relationship status: Not on file  . Intimate partner violence:    Fear of current or ex partner: Not on file    Emotionally abused: Not on file    Physically abused: Not on file    Forced sexual activity: Not on file  Other Topics Concern  . Not on  file  Social History Narrative  . Not on file      Review of Systems  Constitutional: Negative for activity change, chills, fatigue and unexpected weight change.  HENT: Negative for congestion, postnasal drip, rhinorrhea, sneezing and sore throat.   Eyes: Negative.  Negative for redness.  Respiratory: Negative for cough, chest tightness, shortness of breath and wheezing.   Cardiovascular: Negative for chest pain and palpitations.  Gastrointestinal: Negative for abdominal pain, constipation, diarrhea, nausea and vomiting.  Endocrine:       Well controlled hypothyroid  Genitourinary: Negative.  Negative for dysuria and frequency.  Musculoskeletal: Negative for arthralgias, back pain, joint swelling and neck pain.  Skin: Negative for rash.  Allergic/Immunologic: Negative for environmental allergies.  Neurological: Negative for dizziness, tremors, numbness and headaches.  Hematological: Negative for adenopathy. Does not bruise/bleed easily.  Psychiatric/Behavioral: Negative for behavioral problems (Depression), sleep disturbance and suicidal ideas. The patient is not nervous/anxious.     Today's Vitals   11/19/17 0848  BP: 136/67  Pulse: 68  Resp: 16  SpO2: 96%  Weight: 194 lb (88 kg)  Height: 5' 3.3" (1.608 m)    Physical Exam  Constitutional: She is oriented to person, place, and time. She appears well-developed and well-nourished. No distress.  HENT:  Head: Normocephalic and atraumatic.  Mouth/Throat: Oropharynx is clear and moist. No oropharyngeal exudate.  Eyes: Pupils are equal, round, and reactive to light. EOM are normal.  Neck: Normal range of motion. Neck supple. No JVD present. Carotid bruit is not present. No tracheal deviation present. No thyromegaly present.  Cardiovascular: Normal rate, regular rhythm and normal heart sounds. Exam reveals no gallop and no friction rub.  No murmur heard. Pulmonary/Chest: Effort normal and breath sounds normal. No respiratory  distress. She has no wheezes. She has no rales. She exhibits no tenderness.  Abdominal: Soft. Bowel sounds are normal. There is no tenderness.  Musculoskeletal: Normal range of motion.  Lymphadenopathy:    She has no cervical adenopathy.  Neurological: She is alert and oriented to person, place, and time. No cranial nerve deficit.  Skin: Skin is warm and dry. She is not diaphoretic.  Psychiatric: She has a normal mood and affect. Her behavior is normal. Judgment and thought content normal.  Nursing note and vitals reviewed.  Assessment/Plan: 1. HTN (hypertension), benign Well controlled. Continue BP medication as prescribed. Refills sent to pharmacy today - losartan (COZAAR) 25 MG tablet; Take 1 tablet (25 mg total) by mouth daily.  Dispense: 90 tablet; Refill: 4 - losartan-hydrochlorothiazide (HYZAAR) 50-12.5 MG tablet; Take 1 tablet by mouth daily.  Dispense: 90 tablet; Refill: 4  2. Acquired hypothyroidism Thyroid panel WN. Continue levothryolxine at 146mcg daily. New rx sent to her pharmacy.  - levothyroxine (SYNTHROID, LEVOTHROID) 100  MCG tablet; Take 1 tablet (100 mcg total) by mouth daily before breakfast.  Dispense: 90 tablet; Refill: 4  General Counseling: Nivedita verbalizes understanding of the findings of todays visit and agrees with plan of treatment. I have discussed any further diagnostic evaluation that may be needed or ordered today. We also reviewed her medications today. she has been encouraged to call the office with any questions or concerns that should arise related to todays visit.    Counseling:   Hypertension Counseling:   The following hypertensive lifestyle modification were recommended and discussed:  1. Limiting alcohol intake to less than 1 oz/day of ethanol:(24 oz of beer or 8 oz of wine or 2 oz of 100-proof whiskey). 2. Take baby ASA 81 mg daily. 3. Importance of regular aerobic exercise and losing weight. 4. Reduce dietary saturated fat and cholesterol  intake for overall cardiovascular health. 5. Maintaining adequate dietary potassium, calcium, and magnesium intake. 6. Regular monitoring of the blood pressure. 7. Reduce sodium intake to less than 100 mmol/day (less than 2.3 gm of sodium or less than 6 gm of sodium choride)   This patient was seen by Leretha Pol, FNP- C in Collaboration with Dr Lavera Guise as a part of collaborative care agreement  Meds ordered this encounter  Medications  . levothyroxine (SYNTHROID, LEVOTHROID) 100 MCG tablet    Sig: Take 1 tablet (100 mcg total) by mouth daily before breakfast.    Dispense:  90 tablet    Refill:  4    Order Specific Question:   Supervising Provider    Answer:   Lavera Guise [place]  . losartan (COZAAR) 25 MG tablet    Sig: Take 1 tablet (25 mg total) by mouth daily.    Dispense:  90 tablet    Refill:  4    Order Specific Question:   Supervising Provider    Answer:   Lavera Guise [[id number]]  . losartan-hydrochlorothiazide (HYZAAR) 50-12.5 MG tablet    Sig: Take 1 tablet by mouth daily.    Dispense:  90 tablet    Refill:  4    Order Specific Question:   Supervising Provider    Answer:   Lavera Guise [[id number]]    Time spent: 51 Minutes      Dr Lavera Guise Internal medicine

## 2017-12-16 ENCOUNTER — Other Ambulatory Visit: Payer: Self-pay | Admitting: Nurse Practitioner

## 2017-12-16 DIAGNOSIS — I10 Essential (primary) hypertension: Secondary | ICD-10-CM

## 2017-12-16 MED ORDER — OLMESARTAN MEDOXOMIL-HCTZ 20-12.5 MG PO TABS: 1.0000 | ORAL_TABLET | Freq: Every day | ORAL | 5 refills | Status: DC

## 2017-12-16 NOTE — Progress Notes (Signed)
  Due to drug shortage, changed losartan/hctz to olmesartan/HCTZ 20/12.5mg  and sent new prescription to her pharamcy.

## 2018-04-09 ENCOUNTER — Ambulatory Visit (INDEPENDENT_AMBULATORY_CARE_PROVIDER_SITE_OTHER): Payer: Medicare Other | Admitting: Adult Health

## 2018-04-09 ENCOUNTER — Encounter: Payer: Self-pay | Admitting: Adult Health

## 2018-04-09 VITALS — BP 152/74 | HR 73 | Temp 97.6°F | Resp 16 | Ht 63.0 in | Wt 190.0 lb

## 2018-04-09 DIAGNOSIS — R5383 Other fatigue: Secondary | ICD-10-CM | POA: Diagnosis not present

## 2018-04-09 DIAGNOSIS — N390 Urinary tract infection, site not specified: Secondary | ICD-10-CM | POA: Diagnosis not present

## 2018-04-09 DIAGNOSIS — R5381 Other malaise: Secondary | ICD-10-CM | POA: Diagnosis not present

## 2018-04-09 DIAGNOSIS — E559 Vitamin D deficiency, unspecified: Secondary | ICD-10-CM | POA: Diagnosis not present

## 2018-04-09 DIAGNOSIS — L299 Pruritus, unspecified: Secondary | ICD-10-CM | POA: Diagnosis not present

## 2018-04-09 DIAGNOSIS — I10 Essential (primary) hypertension: Secondary | ICD-10-CM | POA: Diagnosis not present

## 2018-04-09 DIAGNOSIS — R300 Dysuria: Secondary | ICD-10-CM | POA: Diagnosis not present

## 2018-04-09 DIAGNOSIS — E611 Iron deficiency: Secondary | ICD-10-CM | POA: Diagnosis not present

## 2018-04-09 DIAGNOSIS — R319 Hematuria, unspecified: Secondary | ICD-10-CM

## 2018-04-09 DIAGNOSIS — R630 Anorexia: Secondary | ICD-10-CM

## 2018-04-09 LAB — POCT URINALYSIS DIPSTICK
Glucose, UA: NEGATIVE
Nitrite, UA: NEGATIVE
Protein, UA: POSITIVE — AB
Spec Grav, UA: 1.025 (ref 1.010–1.025)
Urobilinogen, UA: 0.2 E.U./dL
pH, UA: 6 (ref 5.0–8.0)

## 2018-04-09 MED ORDER — NITROFURANTOIN MONOHYD MACRO 100 MG PO CAPS: 100.0000 mg | ORAL_CAPSULE | Freq: Two times a day (BID) | ORAL | 0 refills | Status: DC

## 2018-04-09 MED ORDER — HYDROXYZINE HCL 25 MG PO TABS: 12.5000 mg | ORAL_TABLET | Freq: Every evening | ORAL | 0 refills | Status: DC | PRN

## 2018-04-09 NOTE — Progress Notes (Signed)
[facility] [place], [place] [address]  Internal MEDICINE  Office Visit Note  Patient Name: Lynn Taylor  [id number]  [id number]  Date of Service: 04/09/2018  Chief Complaint  Patient presents with  . Hypertension    bp high stools have been disclorored   . Fatigue  . Pruritis  . Anorexia     HPI Pt is here for a sick visit.  Patient is here complaining of fatigue, anorexia, whole body pruritus, and high blood pressure. Patient also is complaining some vague symptoms of discomfort.  She is unable to describe these symptoms.  A urine dip was performed at this visit, see results sheet.  She does report a decrease in her urine production.  She denies ever having symptoms like these before.  She reports this fatigue has been getting worse over the last few weeks, and over the last few days she has noticed the itching, decrease in appetite, and intermittent high blood pressure.  She denies any fever, shortness of breath, chest pain, palpitations or other symptoms.    Current Medication:  Outpatient Encounter Medications as of 04/09/2018  Medication Sig  . aspirin EC 81 MG tablet Take 81 mg by mouth daily.  . Cholecalciferol 1000 units capsule Take 1,000 Units by mouth daily.  Marland Kitchen levothyroxine (SYNTHROID, LEVOTHROID) 100 MCG tablet Take 1 tablet (100 mcg total) by mouth daily before breakfast.  . losartan (COZAAR) 25 MG tablet Take 1 tablet (25 mg total) by mouth daily.  Marland Kitchen olmesartan-hydrochlorothiazide (BENICAR HCT) 20-12.5 MG tablet Take 1 tablet by mouth daily.  . hydrOXYzine (ATARAX/VISTARIL) 25 MG tablet Take 0.5 tablets (12.5 mg total) by mouth at bedtime as needed.  . nitrofurantoin, macrocrystal-monohydrate, (MACROBID) 100 MG capsule Take 1 capsule (100 mg total) by mouth 2 (two) times daily.  . [DISCONTINUED] omeprazole (PRILOSEC) 20 MG capsule Take 20 mg by mouth daily.   No facility-administered encounter medications on file as of 04/09/2018.        Medical History: Past Medical History:  Diagnosis Date  . GERD (gastroesophageal reflux disease)   . Hypertension   . Hypothyroidism      Vital Signs: BP (!) 152/74 (BP Location: Left Arm, Patient Position: Sitting, Cuff Size: Normal)   Pulse 73   Temp 97.6 F (36.4 C) (Oral)   Resp 16   Ht 5\' 3"  (1.6 m)   Wt 190 lb (86.2 kg)   SpO2 98%   BMI 33.66 kg/m    Review of Systems  Constitutional: Positive for appetite change and fatigue. Negative for chills and unexpected weight change.  HENT: Negative for congestion, rhinorrhea, sneezing and sore throat.   Eyes: Negative for photophobia, pain and redness.  Respiratory: Negative for cough, chest tightness and shortness of breath.   Cardiovascular: Negative for chest pain and palpitations.  Gastrointestinal: Negative for abdominal pain, constipation, diarrhea, nausea and vomiting.  Endocrine: Negative.   Genitourinary: Positive for decreased urine volume and dysuria. Negative for frequency.  Musculoskeletal: Negative for arthralgias, back pain, joint swelling and neck pain.  Skin: Negative for rash.       Itching all over body. No rash  Allergic/Immunologic: Negative.   Neurological: Negative for tremors and numbness.  Hematological: Negative for adenopathy. Does not bruise/bleed easily.  Psychiatric/Behavioral: Negative for behavioral problems and sleep disturbance. The patient is not nervous/anxious.     Physical Exam  Constitutional: She is oriented to person, place, and time. She appears well-developed and well-nourished. No distress.  HENT:  Head: Normocephalic and atraumatic.  Mouth/Throat: Oropharynx is clear and moist. No oropharyngeal exudate.  Eyes: Pupils are equal, round, and reactive to light. EOM are normal.  Neck: Normal range of motion. Neck supple. No JVD present. No tracheal deviation present. No thyromegaly present.  Cardiovascular: Normal rate, regular rhythm and normal heart sounds. Exam reveals  no gallop and no friction rub.  No murmur heard. Pulmonary/Chest: Effort normal and breath sounds normal. No respiratory distress. She has no wheezes. She has no rales. She exhibits no tenderness.  Abdominal: Soft. There is no tenderness. There is no guarding.  Musculoskeletal: Normal range of motion. She exhibits no tenderness.  No CVA tenderness  Lymphadenopathy:    She has no cervical adenopathy.  Neurological: She is alert and oriented to person, place, and time. No cranial nerve deficit.  Skin: Skin is warm and dry. She is not diaphoretic.  Psychiatric: She has a normal mood and affect. Her behavior is normal. Judgment and thought content normal.  Nursing note and vitals reviewed.  Assessment/Plan: 1. Urinary tract infection with hematuria, site unspecified Patient's urine will be sent for urine culture, will begin treatment with Macrobid and change if urine culture indicates otherwise. - CULTURE, URINE COMPREHENSIVE - Comprehensive metabolic panel - nitrofurantoin, macrocrystal-monohydrate, (MACROBID) 100 MG capsule; Take 1 capsule (100 mg total) by mouth 2 (two) times daily.  Dispense: 20 capsule; Refill: 0  2. Itching Patient has pruritus without any signs of rash or irritation/inflamation - hydrOXYzine (ATARAX/VISTARIL) 25 MG tablet; Take 0.5 tablets (12.5 mg total) by mouth at bedtime as needed.  Dispense: 15 tablet; Refill: 0  3. Dysuria - POCT Urinalysis Dipstick  4. HTN (hypertension), benign Patient's blood pressure slightly elevated at this time.  We will continue to monitor in the future, most likely elevated today due to infectious process. - Comprehensive metabolic panel  5. Anorexia Decreased appetite this time could be due to urinary tract infection.  Spoke with patient at length about this, she agrees to start treating her UTI with antibiotics and see if her anorexia improves.  6. Fatigue, unspecified type Fatigue labs ordered to evaluate patient's iron,  electrolytes and vitamin levels. - Fe+TIBC+Fer - B12 and Folate Panel - Vitamin D 1,25 dihydroxy - Comprehensive metabolic panel  General Counseling: [name] verbalizes understanding of the findings of todays visit and agrees with plan of treatment. I have discussed any further diagnostic evaluation that may be needed or ordered today. We also reviewed her medications today. she has been encouraged to call the office with any questions or concerns that should arise related to todays visit.   Orders Placed This Encounter  Procedures  . CULTURE, URINE COMPREHENSIVE  . Fe+TIBC+Fer  . B12 and Folate Panel  . Vitamin D 1,25 dihydroxy  . Comprehensive metabolic panel  . POCT Urinalysis Dipstick    Meds ordered this encounter  Medications  . hydrOXYzine (ATARAX/VISTARIL) 25 MG tablet    Sig: Take 0.5 tablets (12.5 mg total) by mouth at bedtime as needed.    Dispense:  15 tablet    Refill:  0  . nitrofurantoin, macrocrystal-monohydrate, (MACROBID) 100 MG capsule    Sig: Take 1 capsule (100 mg total) by mouth 2 (two) times daily.    Dispense:  20 capsule    Refill:  0    Time spent: 30 Minutes  This patient was seen by Orson Gear AGNP-C in Collaboration with Dr Lavera Guise as a part of collaborative care agreement.  Kendell Bane AGNP-C  Internal Medicine

## 2018-04-09 NOTE — Patient Instructions (Signed)

## 2018-04-11 ENCOUNTER — Other Ambulatory Visit: Payer: Self-pay

## 2018-04-11 ENCOUNTER — Observation Stay
Admission: EM | Admit: 2018-04-11 | Discharge: 2018-04-12 | Disposition: A | Discharge status: Home / Self Care | Payer: Medicare Other | Attending: Internal Medicine | Admitting: Internal Medicine

## 2018-04-11 ENCOUNTER — Emergency Department: Payer: Medicare Other

## 2018-04-11 DIAGNOSIS — R0789 Other chest pain: Secondary | ICD-10-CM | POA: Diagnosis not present

## 2018-04-11 DIAGNOSIS — Z79890 Hormone replacement therapy (postmenopausal): Secondary | ICD-10-CM | POA: Diagnosis not present

## 2018-04-11 DIAGNOSIS — R079 Chest pain, unspecified: Secondary | ICD-10-CM | POA: Diagnosis present

## 2018-04-11 DIAGNOSIS — N3000 Acute cystitis without hematuria: Secondary | ICD-10-CM | POA: Diagnosis not present

## 2018-04-11 DIAGNOSIS — Z87891 Personal history of nicotine dependence: Secondary | ICD-10-CM | POA: Insufficient documentation

## 2018-04-11 DIAGNOSIS — K219 Gastro-esophageal reflux disease without esophagitis: Secondary | ICD-10-CM | POA: Diagnosis not present

## 2018-04-11 DIAGNOSIS — Z79899 Other long term (current) drug therapy: Secondary | ICD-10-CM | POA: Diagnosis not present

## 2018-04-11 DIAGNOSIS — I10 Essential (primary) hypertension: Secondary | ICD-10-CM | POA: Diagnosis not present

## 2018-04-11 DIAGNOSIS — Z7982 Long term (current) use of aspirin: Secondary | ICD-10-CM | POA: Diagnosis not present

## 2018-04-11 DIAGNOSIS — E039 Hypothyroidism, unspecified: Secondary | ICD-10-CM | POA: Insufficient documentation

## 2018-04-11 DIAGNOSIS — E782 Mixed hyperlipidemia: Secondary | ICD-10-CM | POA: Diagnosis not present

## 2018-04-11 DIAGNOSIS — E876 Hypokalemia: Secondary | ICD-10-CM | POA: Insufficient documentation

## 2018-04-11 DIAGNOSIS — R0602 Shortness of breath: Secondary | ICD-10-CM | POA: Diagnosis not present

## 2018-04-11 LAB — URINALYSIS, COMPLETE (UACMP) WITH MICROSCOPIC
Bilirubin Urine: NEGATIVE
Glucose, UA: NEGATIVE mg/dL
Ketones, ur: 5 mg/dL — AB
Nitrite: NEGATIVE
Protein, ur: NEGATIVE mg/dL
Specific Gravity, Urine: 1.012 (ref 1.005–1.030)
pH: 5 (ref 5.0–8.0)

## 2018-04-11 LAB — CBC
HCT: 38.7 % (ref 36.0–46.0)
Hemoglobin: 13.2 g/dL (ref 12.0–15.0)
MCH: 27.9 pg (ref 26.0–34.0)
MCHC: 34.1 g/dL (ref 30.0–36.0)
MCV: 81.8 fL (ref 80.0–100.0)
Platelets: 246 10*3/uL (ref 150–400)
RBC: 4.73 MIL/uL (ref 3.87–5.11)
RDW: 15.7 % — ABNORMAL HIGH (ref 11.5–15.5)
WBC: 7.6 10*3/uL (ref 4.0–10.5)
nRBC: 0 % (ref 0.0–0.2)

## 2018-04-11 LAB — BASIC METABOLIC PANEL
Anion gap: 15 (ref 5–15)
BUN: 13 mg/dL (ref 8–23)
CO2: 27 mmol/L (ref 22–32)
Calcium: 10.6 mg/dL — ABNORMAL HIGH (ref 8.9–10.3)
Chloride: 93 mmol/L — ABNORMAL LOW (ref 98–111)
Creatinine, Ser: 1.02 mg/dL — ABNORMAL HIGH (ref 0.44–1.00)
GFR calc Af Amer: >60 mL/min (ref >60)
GFR calc non Af Amer: 54 mL/min — ABNORMAL LOW (ref >60)
Glucose, Bld: 122 mg/dL — ABNORMAL HIGH (ref 70–99)
Potassium: 2.5 mmol/L — CL (ref 3.5–5.1)
Sodium: 135 mmol/L (ref 135–145)

## 2018-04-11 LAB — TROPONIN I
Troponin I: <0.03 ng/mL (ref <0.03)
Troponin I: <0.03 ng/mL (ref <0.03)

## 2018-04-11 MED ORDER — POTASSIUM CHLORIDE 10 MEQ/100ML IV SOLN
10.0000 meq | INTRAVENOUS | Status: AC
  Filled 2018-04-11 (×2): qty 100

## 2018-04-11 MED ORDER — POTASSIUM CHLORIDE CRYS ER 20 MEQ PO TBCR
40.0000 meq | EXTENDED_RELEASE_TABLET | Freq: Once | ORAL | Status: AC
  Administered 2018-04-11: 40 meq via ORAL
  Filled 2018-04-11: qty 2

## 2018-04-11 MED ORDER — SODIUM CHLORIDE 0.9 % IV SOLN
1.0000 g | Freq: Once | INTRAVENOUS | Status: AC
  Administered 2018-04-12: 1 g via INTRAVENOUS
  Filled 2018-04-11: qty 1

## 2018-04-11 MED ORDER — ONDANSETRON HCL 4 MG/2ML IJ SOLN
4.0000 mg | Freq: Once | INTRAMUSCULAR | Status: AC
  Administered 2018-04-11: 4 mg via INTRAVENOUS

## 2018-04-11 MED ORDER — POTASSIUM CHLORIDE 10 MEQ/100ML IV SOLN
10.0000 meq | INTRAVENOUS | Status: AC
  Administered 2018-04-11 – 2018-04-12 (×2): 10 meq via INTRAVENOUS
  Filled 2018-04-11: qty 100

## 2018-04-11 MED ORDER — ASPIRIN 81 MG PO CHEW
324.0000 mg | CHEWABLE_TABLET | Freq: Once | ORAL | Status: AC
  Administered 2018-04-11: 324 mg via ORAL
  Filled 2018-04-11: qty 4

## 2018-04-11 MED ORDER — ONDANSETRON HCL 4 MG/2ML IJ SOLN
INTRAMUSCULAR | Status: AC
  Filled 2018-04-11: qty 2

## 2018-04-11 NOTE — ED Notes (Signed)
Pt ambulatory to BR at this time with steady gait and in NAD.

## 2018-04-11 NOTE — ED Provider Notes (Signed)
[facility] Emergency Department Provider Note  ____________________________________________  Time seen: Approximately 9:42 PM  I have reviewed the triage vital signs and the nursing notes.   HISTORY  Chief Complaint Shortness of Breath    HPI Lynn Taylor is a 72 y.o. female with a history of HTN presenting with chest pain, shortness of breath, nausea.  The patient reports that for the past week, she has had an exertional chest pain and shortness of breath.  She describes a central chest heaviness associated shortness of breath that occurs only with exertion and resolves with rest.  She has not had any diaphoresis, palpitations, lightheadedness or syncope.  She has had some nausea without vomiting.  She was seen by her PMD 3 days ago, and found to have UTI for which she has been taking antibiotics; she was asymptomatic except for abnormal looking urine, which has improved.  No recent illness.  Patient has not had any risk stratification studies; she had a stress test many years ago, and has never undergone cardiac catheterization.  SH: Denies cocaine or tobacco  Past Medical History:  Diagnosis Date  . GERD (gastroesophageal reflux disease)   . Hypertension   . Hypothyroidism     Patient Active Problem List   Diagnosis Date Noted  . HTN (hypertension), benign 05/20/2017  . Acquired hypothyroidism 05/20/2017  . Vitamin D deficiency, unspecified 05/20/2017    Past Surgical History:  Procedure Laterality Date  . CHOLECYSTECTOMY    . COLONOSCOPY N/A 12/02/2016   Procedure: COLONOSCOPY;  Surgeon: Lollie Sails, MD;  Location: [facility] ENDOSCOPY;  Service: Endoscopy;  Laterality: N/A;    Current Outpatient Rx  . Order #: [id number] Class: Historical Med  . Order #: [id number] Class: Historical Med  . Order #: [id number] Class: Normal  . Order #: [id number] Class: Normal  . Order #: [id number] Class: Normal  . Order #: [id number] Class: Normal  . Order #:  [id number] Class: Normal    Allergies Patient has no known allergies.  Family History  Problem Relation Age of Onset  . Diabetes Mellitus I Other   . Alcoholism Other   . Hypertension Other   . Hyperlipidemia Other   . Coronary artery disease Other   . Stroke Other   . Osteoarthritis Other   . Migraines Other   . Breast cancer Neg Hx     Social History Social History   Tobacco Use  . Smoking status: Never Smoker  . Smokeless tobacco: Never Used  Substance Use Topics  . Alcohol use: Yes    Comment: social drinker  . Drug use: No    Review of Systems Constitutional: No fever/chills.  No lightheadedness or syncope.  Positive general malaise. Eyes: No visual changes. ENT: No sore throat. No congestion or rhinorrhea. Cardiovascular: Positive chest pain. Denies palpitations. Respiratory: Positive shortness of breath.  No cough. Gastrointestinal: No abdominal pain.  Positive nausea, no vomiting.  No diarrhea.  No constipation. Genitourinary: Negative for dysuria. Musculoskeletal: Negative for back pain.  No lower extreme any swelling or calf pain. Skin: Negative for rash. Neurological: Negative for headaches. No focal numbness, tingling or weakness.     ____________________________________________   PHYSICAL EXAM:  VITAL SIGNS: ED Triage Vitals  Enc Vitals Group     BP 04/11/18 1724 (!) 155/64     Pulse Rate 04/11/18 1724 70     Resp 04/11/18 1724 16     Temp 04/11/18 1724 98.3 F (36.8 C)     Temp Source 04/11/18 1724 Oral  SpO2 04/11/18 1724 99 %     Weight 04/11/18 1723 190 lb (86.2 kg)     Height 04/11/18 1723 5\' 3"  (1.6 m)     Head Circumference --      Peak Flow --      Pain Score 04/11/18 1723 5     Pain Loc --      Pain Edu? --      Excl. in [place]? --     Constitutional: Alert and oriented. Answers questions appropriately. Eyes: Conjunctivae are normal.  EOMI. No scleral icterus. Head: Atraumatic. Nose: No congestion/rhinnorhea. Mouth/Throat:  Mucous membranes are moist.  Neck: No stridor.  Supple.  No JVD.  No meningismus. Cardiovascular: Normal rate, regular rhythm. No murmurs, rubs or gallops.  Respiratory: Normal respiratory effort.  No accessory muscle use or retractions. Lungs CTAB.  No wheezes, rales or ronchi. Gastrointestinal: Soft, nontender and nondistended.  No guarding or rebound.  No peritoneal signs. Musculoskeletal: No LE edema. No ttp in the calves or palpable cords.  Negative Homan's sign. Neurologic:  A&Ox3.  Speech is clear.  Face and smile are symmetric.  EOMI.  Moves all extremities well. Skin:  Skin is warm, dry and intact. No rash noted. Psychiatric: Mood and affect are normal.   ____________________________________________   LABS (all labs ordered are listed, but only abnormal results are displayed)  Labs Reviewed  BASIC METABOLIC PANEL - Abnormal; Notable for the following components:      Result Value   Potassium 2.5 (*)    Chloride 93 (*)    Glucose, Bld 122 (*)    Creatinine, Ser 1.02 (*)    Calcium 10.6 (*)    GFR calc non Af Amer 54 (*)    All other components within normal limits  CBC - Abnormal; Notable for the following components:   RDW 15.7 (*)    All other components within normal limits  TROPONIN I  URINALYSIS, COMPLETE (UACMP) WITH MICROSCOPIC  TROPONIN I   ____________________________________________  EKG  ED ECG REPORT I, Anne-Caroline Mariea Clonts, the attending physician, personally viewed and interpreted this ECG.   Date: 04/11/2018  EKG Time: 524  Rate: 73  Rhythm: normal sinus rhythm  Axis: normal  Intervals:none  ST&T Change: No STEMI  ____________________________________________  RADIOLOGY  Dg Chest 2 View  Result Date: 04/11/2018 CLINICAL DATA:  Chest discomfort and shortness of breath for 1 week EXAM: CHEST - 2 VIEW COMPARISON:  None. FINDINGS: Cardiac shadows within normal limits. The lungs are well aerated bilaterally. No focal infiltrate or sizable effusion  is seen. No acute bony abnormality is noted. IMPRESSION: No active cardiopulmonary disease. Electronically Signed   By: Inez Catalina M.D.   On: 04/11/2018 17:53    ____________________________________________   PROCEDURES  Procedure(s) performed: None  Procedures  Critical Care performed: No ____________________________________________   INITIAL IMPRESSION / ASSESSMENT AND PLAN / ED COURSE  Pertinent labs & imaging results that were available during my care of the patient were reviewed by me and considered in my medical decision making (see chart for details).  72 y.o. female with a history of hypertension presenting with exertional chest pain and shortness of breath, nausea without vomiting.  Overall, the patient is well-appearing and has a normal cardiopulmonary examination.  Her EKG does not show ischemic changes or arrhythmia.  Her first troponin is negative.  However, I am concerned about the history that she is given and her cardiac risk factors.  She will receive aspirin, and be admitted to  the hospital for full cardiac evaluation.  PE and aortic pathology are very unlikely.  We will also rule out UTI.  At this time, the patient will be admitted to the hospitalist.  ____________________________________________  FINAL CLINICAL IMPRESSION(S) / ED DIAGNOSES  Final diagnoses:  Exertional chest pain  Exertional shortness of breath  Hypokalemia         NEW MEDICATIONS STARTED DURING THIS VISIT:  New Prescriptions   No medications on file      Eula Listen, MD 04/11/18 2146

## 2018-04-11 NOTE — H&P (Signed)
[facility] [place] NAME: Lynn Taylor    MR#:  [id number]  DATE OF BIRTH:  16-Aug-1945  DATE OF ADMISSION:  04/11/2018  PRIMARY CARE PHYSICIAN: Lavera Guise, MD   REQUESTING/REFERRING PHYSICIAN: Mariea Clonts, MD  CHIEF COMPLAINT:   Chief Complaint  Patient presents with  . Shortness of Breath    HISTORY OF PRESENT ILLNESS:  Lynn Taylor  is a 72 y.o. female who presents with chief complaint as above.  Patient states that for the last week she has been having intermittent chest pain with exertion.  Chest pain is associated with lightheadedness, nausea, shortness of breath.  Her symptoms are relieved with rest.  Today was her worst episode and she came to the ED for evaluation.  Work-up initially is largely within normal limits, but given strong concern for the symptoms she is having hospitalist were called for admission and further evaluation  PAST MEDICAL HISTORY:   Past Medical History:  Diagnosis Date  . GERD (gastroesophageal reflux disease)   . Hypertension   . Hypothyroidism      PAST SURGICAL HISTORY:   Past Surgical History:  Procedure Laterality Date  . CHOLECYSTECTOMY    . COLONOSCOPY N/A 12/02/2016   Procedure: COLONOSCOPY;  Surgeon: Lollie Sails, MD;  Location: [facility] ENDOSCOPY;  Service: Endoscopy;  Laterality: N/A;     SOCIAL HISTORY:   Social History   Tobacco Use  . Smoking status: Never Smoker  . Smokeless tobacco: Never Used  Substance Use Topics  . Alcohol use: Yes    Comment: social drinker     FAMILY HISTORY:   Family History  Problem Relation Age of Onset  . Diabetes Mellitus I Other   . Alcoholism Other   . Hypertension Other   . Hyperlipidemia Other   . Coronary artery disease Other   . Stroke Other   . Osteoarthritis Other   . Migraines Other   . Breast cancer Neg Hx      DRUG ALLERGIES:  No Known Allergies  MEDICATIONS AT HOME:   Prior to Admission medications    Medication Sig Start Date End Date Taking? Authorizing Provider  aspirin EC 81 MG tablet Take 81 mg by mouth daily.    [provider]  Cholecalciferol 1000 units capsule Take 1,000 Units by mouth daily.    [provider]  hydrOXYzine (ATARAX/VISTARIL) 25 MG tablet Take 0.5 tablets (12.5 mg total) by mouth at bedtime as needed. 04/09/18   Kendell Bane, NP  levothyroxine (SYNTHROID, LEVOTHROID) 100 MCG tablet Take 1 tablet (100 mcg total) by mouth daily before breakfast. 11/19/17   Ronnell Freshwater, NP  losartan (COZAAR) 25 MG tablet Take 1 tablet (25 mg total) by mouth daily. 11/19/17   Ronnell Freshwater, NP  nitrofurantoin, macrocrystal-monohydrate, (MACROBID) 100 MG capsule Take 1 capsule (100 mg total) by mouth 2 (two) times daily. 04/09/18   Kendell Bane, NP  olmesartan-hydrochlorothiazide (BENICAR HCT) 20-12.5 MG tablet Take 1 tablet by mouth daily. 12/16/17   Ronnell Freshwater, NP    REVIEW OF SYSTEMS:  Review of Systems  Constitutional: Negative for chills, fever, malaise/fatigue and weight loss.  HENT: Negative for ear pain, hearing loss and tinnitus.   Eyes: Negative for blurred vision, double vision, pain and redness.  Respiratory: Positive for shortness of breath. Negative for cough and hemoptysis.   Cardiovascular: Positive for chest pain. Negative for palpitations, orthopnea and leg swelling.  Gastrointestinal: Negative for abdominal  pain, constipation, diarrhea, nausea and vomiting.  Genitourinary: Negative for dysuria, frequency and hematuria.  Musculoskeletal: Negative for back pain, joint pain and neck pain.  Skin:       No acne, rash, or lesions  Neurological: Negative for dizziness, tremors, focal weakness and weakness.  Endo/Heme/Allergies: Negative for polydipsia. Does not bruise/bleed easily.  Psychiatric/Behavioral: Negative for depression. The patient is not nervous/anxious and does not have insomnia.      VITAL SIGNS:   Vitals:    04/11/18 1723 04/11/18 1724 04/11/18 2130  BP:  (!) 155/64 (!) 143/71  Pulse:  70 65  Resp:  16 12  Temp:  98.3 F (36.8 C)   TempSrc:  Oral   SpO2:  99% 100%  Weight: 86.2 kg    Height: 5\' 3"  (1.6 m)     Wt Readings from Last 3 Encounters:  04/11/18 86.2 kg  04/09/18 86.2 kg  11/19/17 88 kg    PHYSICAL EXAMINATION:  Physical Exam  Vitals reviewed. Constitutional: She is oriented to person, place, and time. She appears well-developed and well-nourished. No distress.  HENT:  Head: Normocephalic and atraumatic.  Mouth/Throat: Oropharynx is clear and moist.  Eyes: Pupils are equal, round, and reactive to light. Conjunctivae and EOM are normal. No scleral icterus.  Neck: Normal range of motion. Neck supple. No JVD present. No thyromegaly present.  Cardiovascular: Normal rate, regular rhythm and intact distal pulses. Exam reveals no gallop and no friction rub.  No murmur heard. Respiratory: Effort normal and breath sounds normal. No respiratory distress. She has no wheezes. She has no rales.  GI: Soft. Bowel sounds are normal. She exhibits no distension. There is no tenderness.  Musculoskeletal: Normal range of motion. She exhibits no edema.  No arthritis, no gout  Lymphadenopathy:    She has no cervical adenopathy.  Neurological: She is alert and oriented to person, place, and time. No cranial nerve deficit.  No dysarthria, no aphasia  Skin: Skin is warm and dry. No rash noted. No erythema.  Psychiatric: She has a normal mood and affect. Her behavior is normal. Judgment and thought content normal.    LABORATORY PANEL:   CBC Recent Labs  Lab 04/11/18 1725  WBC 7.6  HGB 13.2  HCT 38.7  PLT 246   ------------------------------------------------------------------------------------------------------------------  Chemistries  Recent Labs  Lab 04/09/18 1440 04/11/18 1725  NA 139 135  K 3.0* 2.5*  CL 96 93*  CO2 21 27  GLUCOSE 119* 122*  BUN 12 13  CREATININE 1.01*  1.02*  CALCIUM 10.1 10.6*  AST 456*  --   ALT 711*  --   ALKPHOS 339*  --   BILITOT 6.7*  --    ------------------------------------------------------------------------------------------------------------------  Cardiac Enzymes Recent Labs  Lab 04/11/18 1725  TROPONINI <0.03   ------------------------------------------------------------------------------------------------------------------  RADIOLOGY:  Dg Chest 2 View  Result Date: 04/11/2018 CLINICAL DATA:  Chest discomfort and shortness of breath for 1 week EXAM: CHEST - 2 VIEW COMPARISON:  None. FINDINGS: Cardiac shadows within normal limits. The lungs are well aerated bilaterally. No focal infiltrate or sizable effusion is seen. No acute bony abnormality is noted. IMPRESSION: No active cardiopulmonary disease. Electronically Signed   By: Inez Catalina M.D.   On: 04/11/2018 17:53    EKG:   Orders placed or performed during the hospital encounter of 04/11/18  . ED EKG within 10 minutes  . ED EKG within 10 minutes  . EKG 12-Lead  . EKG 12-Lead    IMPRESSION AND PLAN:  Principal Problem:   Chest pain -patient is currently symptom-free, initial troponin within normal limits, EKG does not show any signs of acute severe ischemia.  We will trend her cardiac enzymes, get an echocardiogram and a cardiology consult Active Problems:   HTN (hypertension), benign -home dose antihypertensives   Acquired hypothyroidism -home dose thyroid replacement   GERD (gastroesophageal reflux disease) -Home dose PPI  Chart review performed and case discussed with ED provider. Labs, imaging and/or ECG reviewed by provider and discussed with patient/family. Management plans discussed with the patient and/or family.  DVT PROPHYLAXIS: SubQ lovenox   GI PROPHYLAXIS:  PPI   ADMISSION STATUS: Observation  CODE STATUS: Full  TOTAL TIME TAKING CARE OF THIS PATIENT: 40 minutes.   Jannifer Franklin, Makensey Rego FIELDING 04/11/2018, 10:01 PM  [facility]  Office  [phone]  CC: Primary care physician; Lavera Guise, MD  Note:  This document was prepared using Dragon voice recognition software and may include unintentional dictation errors.

## 2018-04-11 NOTE — ED Notes (Signed)
Admitting md at bedside

## 2018-04-11 NOTE — ED Notes (Signed)
ED TO INPATIENT HANDOFF REPORT  Name/Age/Gender Lynn Taylor 72 y.o. female  Code Status   Home/SNF/Other Home  Chief Complaint SOB  Level of Care/Admitting Diagnosis ED Disposition    ED Disposition Condition [place]: [place] [100120]  Level of Care: Telemetry [5]  Diagnosis: Chest pain [132440]  Admitting Physician: Lance Coon [[id number]]  Attending Physician: Lance Coon [[id number]]  Bed request comments: 2a  PT Class (Do Not Modify): Observation [104]  PT Acc Code (Do Not Modify): Observation [10022]       Medical History Past Medical History:  Diagnosis Date  . GERD (gastroesophageal reflux disease)   . Hypertension   . Hypothyroidism     Allergies No Known Allergies  IV Location/Drains/Wounds Patient Lines/Drains/Airways Status   Active Line/Drains/Airways    Name:   Placement date:   Placement time:   Site:   Days:   Peripheral IV 04/11/18 Right Wrist   04/11/18    2149    Wrist   less than 1   Peripheral IV 04/11/18 Left Hand   04/11/18    2223    Hand   less than 1   Airway   12/02/16    0818     495          Labs/Imaging Results for orders placed or performed during the hospital encounter of 04/11/18 (from the past 48 hour(s))  Basic metabolic panel     Status: Abnormal   Collection Time: 04/11/18  5:25 PM  Result Value Ref Range   Sodium 135 135 - 145 mmol/L   Potassium 2.5 (LL) 3.5 - 5.1 mmol/L    Comment: CRITICAL RESULT CALLED TO, READ BACK BY AND VERIFIED WITH SHANNON MARTIN AT 1808 04/11/18.PMH   Chloride 93 (L) 98 - 111 mmol/L   CO2 27 22 - 32 mmol/L   Glucose, Bld 122 (H) 70 - 99 mg/dL   BUN 13 8 - 23 mg/dL   Creatinine, Ser 1.02 (H) 0.44 - 1.00 mg/dL   Calcium 10.6 (H) 8.9 - 10.3 mg/dL   GFR calc non Af Amer 54 (L) >60 mL/min   GFR calc Af Amer >60 >60 mL/min    Comment: (NOTE) The eGFR has been calculated using the CKD EPI equation. This calculation has not been validated in  all clinical situations. eGFR's persistently <60 mL/min signify possible Chronic Kidney Disease.    Anion gap 15 5 - 15    Comment: Performed at [facility], [address] [place], [place] [address]  CBC     Status: Abnormal   Collection Time: 04/11/18  5:25 PM  Result Value Ref Range   WBC 7.6 4.0 - 10.5 K/uL   RBC 4.73 3.87 - 5.11 MIL/uL   Hemoglobin 13.2 12.0 - 15.0 g/dL   HCT 38.7 36.0 - 46.0 %   MCV 81.8 80.0 - 100.0 fL   MCH 27.9 26.0 - 34.0 pg   MCHC 34.1 30.0 - 36.0 g/dL   RDW 15.7 (H) 11.5 - 15.5 %   Platelets 246 150 - 400 K/uL   nRBC 0.0 0.0 - 0.2 %    Comment: Performed at [facility], [address] [place], [place] [address]  Troponin I     Status: None   Collection Time: 04/11/18  5:25 PM  Result Value Ref Range   Troponin I <0.03 <0.03 ng/mL    Comment: Performed at [facility], [address] [place],  [place] [address]  Urinalysis, Complete w Microscopic     Status: Abnormal   Collection Time: 04/11/18  9:50 PM  Result Value Ref Range   Color, Urine AMBER (A) YELLOW    Comment: BIOCHEMICALS MAY BE AFFECTED BY COLOR   APPearance HAZY (A) CLEAR   Specific Gravity, Urine 1.012 1.005 - 1.030   pH 5.0 5.0 - 8.0   Glucose, UA NEGATIVE NEGATIVE mg/dL   Hgb urine dipstick SMALL (A) NEGATIVE   Bilirubin Urine NEGATIVE NEGATIVE   Ketones, ur 5 (A) NEGATIVE mg/dL   Protein, ur NEGATIVE NEGATIVE mg/dL   Nitrite NEGATIVE NEGATIVE   Leukocytes, UA SMALL (A) NEGATIVE   RBC / HPF 0-5 0 - 5 RBC/hpf   WBC, UA 6-10 0 - 5 WBC/hpf   Bacteria, UA RARE (A) NONE SEEN   Squamous Epithelial / LPF 6-10 0 - 5   Mucus PRESENT    Hyaline Casts, UA PRESENT     Comment: Performed at [facility], [address] [place], [place] [address]  Troponin I     Status: None   Collection Time: 04/11/18  9:50 PM  Result Value Ref Range   Troponin I <0.03 <0.03 ng/mL    Comment: Performed at [facility], [address]  [place], [place] [address]   Dg Chest 2 View  Result Date: 04/11/2018 CLINICAL DATA:  Chest discomfort and shortness of breath for 1 week EXAM: CHEST - 2 VIEW COMPARISON:  None. FINDINGS: Cardiac shadows within normal limits. The lungs are well aerated bilaterally. No focal infiltrate or sizable effusion is seen. No acute bony abnormality is noted. IMPRESSION: No active cardiopulmonary disease. Electronically Signed   By: Inez Catalina M.D.   On: 04/11/2018 17:53    Pending Labs [employer] (From admission, onward)    Start     Ordered   Signed and Held  CBC  (enoxaparin (LOVENOX)    CrCl >/= 30 ml/min)  Once,   R    Comments:  Baseline for enoxaparin therapy IF NOT ALREADY DRAWN.  Notify MD if PLT < 100 K.    Signed and Held   Signed and Held  Creatinine, serum  (enoxaparin (LOVENOX)    CrCl >/= 30 ml/min)  Once,   R    Comments:  Baseline for enoxaparin therapy IF NOT ALREADY DRAWN.    Signed and Held   Signed and Held  Creatinine, serum  (enoxaparin (LOVENOX)    CrCl >/= 30 ml/min)  Weekly,   R    Comments:  while on enoxaparin therapy    Signed and Held   Signed and Held  Troponin I  Now then every 6 hours,   R     Signed and Held   Signed and Held  Basic metabolic panel  Tomorrow morning,   R     Signed and Held   Signed and Held  CBC  Tomorrow morning,   R     Signed and Held          Vitals/Pain Today's Vitals   04/11/18 1723 04/11/18 1724 04/11/18 2130  BP:  (!) 155/64 (!) 143/71  Pulse:  70 65  Resp:  16 12  Temp:  98.3 F (36.8 C)   TempSrc:  Oral   SpO2:  99% 100%  Weight: 86.2 kg    Height: '5\' 3"'$  (1.6 m)    PainSc: 5       Isolation Precautions No active isolations  Medications Medications  potassium chloride 10 mEq in 100 mL IVPB (has no administration in time range)  potassium chloride 10 mEq in 100 mL IVPB (10 mEq Intravenous New Bag/Given 04/11/18 2232)  potassium chloride SA (K-DUR,KLOR-CON) CR tablet 40 mEq (40 mEq Oral Given 04/11/18 2148)   aspirin chewable tablet 324 mg (324 mg Oral Given 04/11/18 2147)  ondansetron (ZOFRAN) injection 4 mg (4 mg Intravenous Given 04/11/18 2248)    Mobility Ambulatory

## 2018-04-11 NOTE — ED Triage Notes (Signed)
SOB and chest discomfort x 1 week. Nausea, no vomiting. Saw PCP, dx with UTI. States they did lab work.   A&O, in wheelchair. Denies COPD, CHF. Denies weight gain or leg swelling.   Speaking in clear, complete sentences. No distress noted.

## 2018-04-12 ENCOUNTER — Observation Stay: Payer: Medicare Other

## 2018-04-12 ENCOUNTER — Encounter: Payer: Self-pay | Admitting: *Deleted

## 2018-04-12 ENCOUNTER — Telehealth: Payer: Self-pay

## 2018-04-12 ENCOUNTER — Other Ambulatory Visit: Payer: Self-pay

## 2018-04-12 ENCOUNTER — Observation Stay
Admit: 2018-04-12 | Discharge: 2018-04-12 | Disposition: A | Discharge status: Home / Self Care | Payer: Medicare Other | Attending: Internal Medicine | Admitting: Internal Medicine

## 2018-04-12 ENCOUNTER — Other Ambulatory Visit: Payer: Self-pay | Admitting: Adult Health

## 2018-04-12 DIAGNOSIS — I200 Unstable angina: Secondary | ICD-10-CM | POA: Diagnosis not present

## 2018-04-12 DIAGNOSIS — R079 Chest pain, unspecified: Secondary | ICD-10-CM | POA: Diagnosis not present

## 2018-04-12 DIAGNOSIS — E039 Hypothyroidism, unspecified: Secondary | ICD-10-CM | POA: Diagnosis not present

## 2018-04-12 DIAGNOSIS — I10 Essential (primary) hypertension: Secondary | ICD-10-CM | POA: Diagnosis not present

## 2018-04-12 DIAGNOSIS — R7989 Other specified abnormal findings of blood chemistry: Secondary | ICD-10-CM

## 2018-04-12 LAB — MAGNESIUM: Magnesium: 2 mg/dL (ref 1.7–2.4)

## 2018-04-12 LAB — CBC
HCT: 35 % — ABNORMAL LOW (ref 36.0–46.0)
Hemoglobin: 12.1 g/dL (ref 12.0–15.0)
MCH: 28 pg (ref 26.0–34.0)
MCHC: 34.6 g/dL (ref 30.0–36.0)
MCV: 81 fL (ref 80.0–100.0)
Platelets: 240 10*3/uL (ref 150–400)
RBC: 4.32 MIL/uL (ref 3.87–5.11)
RDW: 15.8 % — ABNORMAL HIGH (ref 11.5–15.5)
WBC: 7.1 10*3/uL (ref 4.0–10.5)
nRBC: 0 % (ref 0.0–0.2)

## 2018-04-12 LAB — NM MYOCAR MULTI W/SPECT W/WALL MOTION / EF
Estimated workload: 7 METS
Exercise duration (min): 6 min
Exercise duration (sec): 0 s
LV dias vol: 28 mL (ref 46–106)
LV sys vol: 1 mL
MPHR: 148 {beats}/min
Peak HR: 157 {beats}/min
Percent HR: 106 %
Rest HR: 67 {beats}/min
SDS: 1
SRS: 3
SSS: 2
TID: 0.73

## 2018-04-12 LAB — TROPONIN I
Troponin I: <0.03 ng/mL (ref <0.03)
Troponin I: <0.03 ng/mL (ref <0.03)
Troponin I: <0.03 ng/mL (ref <0.03)

## 2018-04-12 LAB — BASIC METABOLIC PANEL
Anion gap: 10 (ref 5–15)
BUN: 13 mg/dL (ref 8–23)
CO2: 28 mmol/L (ref 22–32)
Calcium: 9.8 mg/dL (ref 8.9–10.3)
Chloride: 97 mmol/L — ABNORMAL LOW (ref 98–111)
Creatinine, Ser: 0.86 mg/dL (ref 0.44–1.00)
GFR calc Af Amer: >60 mL/min (ref >60)
GFR calc non Af Amer: >60 mL/min (ref >60)
Glucose, Bld: 106 mg/dL — ABNORMAL HIGH (ref 70–99)
Potassium: 2.9 mmol/L — ABNORMAL LOW (ref 3.5–5.1)
Sodium: 135 mmol/L (ref 135–145)

## 2018-04-12 LAB — CULTURE, URINE COMPREHENSIVE

## 2018-04-12 MED ORDER — HYDROXYZINE HCL 25 MG PO TABS: 12.5000 mg | ORAL_TABLET | Freq: Every evening | ORAL | Status: DC | PRN

## 2018-04-12 MED ORDER — OXYCODONE HCL 5 MG PO TABS: 5.0000 mg | ORAL_TABLET | ORAL | Status: DC | PRN

## 2018-04-12 MED ORDER — ENOXAPARIN SODIUM 40 MG/0.4ML ~~LOC~~ SOLN: 40.0000 mg | SUBCUTANEOUS | Status: DC

## 2018-04-12 MED ORDER — POTASSIUM CHLORIDE CRYS ER 20 MEQ PO TBCR
40.0000 meq | EXTENDED_RELEASE_TABLET | ORAL | Status: AC
  Administered 2018-04-12 (×2): 40 meq via ORAL
  Filled 2018-04-12 (×2): qty 2

## 2018-04-12 MED ORDER — ACETAMINOPHEN 325 MG PO TABS: 650.0000 mg | ORAL_TABLET | Freq: Four times a day (QID) | ORAL | Status: DC | PRN

## 2018-04-12 MED ORDER — IRBESARTAN 150 MG PO TABS
150.0000 mg | ORAL_TABLET | Freq: Every day | ORAL | Status: DC
  Administered 2018-04-12: 150 mg via ORAL
  Filled 2018-04-12: qty 1

## 2018-04-12 MED ORDER — OLMESARTAN MEDOXOMIL-HCTZ 20-12.5 MG PO TABS: 1.0000 | ORAL_TABLET | Freq: Every day | ORAL | Status: DC

## 2018-04-12 MED ORDER — HYDROCHLOROTHIAZIDE 12.5 MG PO CAPS
12.5000 mg | ORAL_CAPSULE | Freq: Every day | ORAL | Status: DC
  Administered 2018-04-12: 12.5 mg via ORAL
  Filled 2018-04-12: qty 1

## 2018-04-12 MED ORDER — NITROGLYCERIN 0.4 MG SL SUBL: 0.4000 mg | SUBLINGUAL_TABLET | SUBLINGUAL | Status: DC | PRN

## 2018-04-12 MED ORDER — POTASSIUM CHLORIDE 10 MEQ/100ML IV SOLN
10.0000 meq | INTRAVENOUS | Status: AC
  Administered 2018-04-12 (×2): 10 meq via INTRAVENOUS
  Filled 2018-04-12: qty 100

## 2018-04-12 MED ORDER — NITROFURANTOIN MONOHYD MACRO 100 MG PO CAPS
100.0000 mg | ORAL_CAPSULE | Freq: Two times a day (BID) | ORAL | Status: DC
  Administered 2018-04-12 (×2): 100 mg via ORAL
  Filled 2018-04-12 (×3): qty 1

## 2018-04-12 MED ORDER — ASPIRIN EC 81 MG PO TBEC
81.0000 mg | DELAYED_RELEASE_TABLET | Freq: Every day | ORAL | Status: DC
  Administered 2018-04-12: 81 mg via ORAL
  Filled 2018-04-12: qty 1

## 2018-04-12 MED ORDER — LEVOTHYROXINE SODIUM 100 MCG PO TABS
100.0000 ug | ORAL_TABLET | Freq: Every day | ORAL | Status: DC
  Administered 2018-04-12: 100 ug via ORAL
  Filled 2018-04-12: qty 1

## 2018-04-12 MED ORDER — ACETAMINOPHEN 650 MG RE SUPP: 650.0000 mg | Freq: Four times a day (QID) | RECTAL | Status: DC | PRN

## 2018-04-12 MED ORDER — TECHNETIUM TC 99M TETROFOSMIN IV KIT
11.0400 | PACK | Freq: Once | INTRAVENOUS | Status: AC | PRN
  Administered 2018-04-12: 11.04 via INTRAVENOUS

## 2018-04-12 MED ORDER — POTASSIUM CHLORIDE CRYS ER 20 MEQ PO TBCR: 40.0000 meq | EXTENDED_RELEASE_TABLET | ORAL | Status: DC

## 2018-04-12 MED ORDER — TECHNETIUM TC 99M TETROFOSMIN IV KIT
29.1100 | PACK | Freq: Once | INTRAVENOUS | Status: AC | PRN
  Administered 2018-04-12: 29.11 via INTRAVENOUS

## 2018-04-12 MED ORDER — SODIUM CHLORIDE 0.9 % IV SOLN
INTRAVENOUS | Status: DC | PRN
  Administered 2018-04-12 (×2): 1000 mL via INTRAVENOUS

## 2018-04-12 MED ORDER — ONDANSETRON HCL 4 MG PO TABS: 4.0000 mg | ORAL_TABLET | Freq: Four times a day (QID) | ORAL | Status: DC | PRN

## 2018-04-12 MED ORDER — ONDANSETRON HCL 4 MG/2ML IJ SOLN: 4.0000 mg | Freq: Four times a day (QID) | INTRAMUSCULAR | Status: DC | PRN

## 2018-04-12 NOTE — Consult Note (Signed)
PHARMACY ELECTROLYTE NOTE  Patient is a 72 year old female who presents with chest pain. K on admission was 2.6. Was given 40 MEQ via IV overnight K up to 2.9 Add on Mg ordered- follow up level KCL 40 MEQ x 2  Pt is to d/c today- she should have BMP rechecked w/ PCP  Ramond Dial, Pharm.D, BCPS Clinical Pharmacist

## 2018-04-12 NOTE — Consult Note (Signed)
[facility] Consultation Note  Patient ID: Lynn Taylor, MRN: [id number], DOB/AGE: [id number] 72 y.o. Admit date: 04/11/2018   Date of Consult: 04/12/2018 Primary Physician: Lavera Guise, MD Primary Cardiologist: None  Chief Complaint:  Chief Complaint  Patient presents with  . Shortness of Breath   Reason for Consult: Chest pain  HPI: 72 y.o. female with known essential hypertension mixed hyperlipidemia and remote history of tobacco abuse having episodes of chest discomfort dizziness and weakness occurring off and on depending on her activity level.  She has had this in the last week or 2 increasing in frequency and intensity especially when bending over.  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  She did not have further issues on Sunday and therefore came to the emergency room.  At that time she had full relief of her symptoms spontaneously.  Troponin levels have been normal.  The patient has had an EKG showing normal sinus rhythm with nonspecific ST changes.  Currently she is hemodynamically stable with no further symptoms today  Past Medical History:  Diagnosis Date  . GERD (gastroesophageal reflux disease)   . Hypertension   . Hypothyroidism       Surgical History:  Past Surgical History:  Procedure Laterality Date  . CHOLECYSTECTOMY    . COLONOSCOPY N/A 12/02/2016   Procedure: COLONOSCOPY;  Surgeon: Lollie Sails, MD;  [facility] ENDOSCOPY;  Service: Endoscopy;  Laterality: N/A;     Home Meds: Prior to Admission medications   Medication Sig Start Date End Date Taking? Authorizing Provider  aspirin EC 81 MG tablet Take 81 mg by mouth daily.   Yes [provider]  Cholecalciferol 1000 units capsule Take 1,000 Units by mouth daily.   Yes [provider]  hydrOXYzine (ATARAX/VISTARIL) 25 MG tablet Take 0.5 tablets (12.5 mg total) by mouth at bedtime as needed. 04/09/18   Yes Scarboro, Audie Clear, NP  levothyroxine (SYNTHROID, LEVOTHROID) 100 MCG tablet Take 1 tablet (100 mcg total) by mouth daily before breakfast. 11/19/17  Yes Boscia, Heather E, NP  losartan (COZAAR) 25 MG tablet Take 1 tablet (25 mg total) by mouth daily. 11/19/17  Yes Boscia, Greer Ee, NP  nitrofurantoin, macrocrystal-monohydrate, (MACROBID) 100 MG capsule Take 1 capsule (100 mg total) by mouth 2 (two) times daily. 04/09/18  Yes Scarboro, Audie Clear, NP  olmesartan-hydrochlorothiazide (BENICAR HCT) 20-12.5 MG tablet Take 1 tablet by mouth daily. 12/16/17  Yes Ronnell Freshwater, NP    Inpatient Medications:  . aspirin EC  81 mg Oral Daily  . enoxaparin (LOVENOX) injection  40 mg Subcutaneous Q24H  . irbesartan  150 mg Oral Daily   And  . hydrochlorothiazide  12.5 mg Oral Daily  . levothyroxine  100 mcg Oral QAC breakfast  . nitrofurantoin (macrocrystal-monohydrate)  100 mg Oral BID   . sodium chloride Stopped (04/12/18 0459)    Allergies: No Known Allergies  Social History   Socioeconomic History  . Marital status: Married    Spouse name: Not on file  . Number of children: Not on file  . Years of education: Not on file  . Highest education level: Not on file  Occupational History  . Not on file  Social Needs  . Financial resource strain: Not on file  . Food insecurity:    Worry: Not on file    Inability: Not on file  . Transportation needs:  Medical: Not on file    Non-medical: Not on file  Tobacco Use  . Smoking status: Never Smoker  . Smokeless tobacco: Never Used  Substance and Sexual Activity  . Alcohol use: Yes    Comment: social drinker  . Drug use: No  . Sexual activity: Not on file  Lifestyle  . Physical activity:    Days per week: Not on file    Minutes per session: Not on file  . Stress: Not on file  Relationships  . Social connections:    Talks on phone: Not on file    Gets together: Not on file    Attends religious service: Not on file    Active member of  club or organization: Not on file    Attends meetings of clubs or organizations: Not on file    Relationship status: Not on file  . Intimate partner violence:    Fear of current or ex partner: Not on file    Emotionally abused: Not on file    Physically abused: Not on file    Forced sexual activity: Not on file  Other Topics Concern  . Not on file  Social History Narrative  . Not on file     Family History  Problem Relation Age of Onset  . Diabetes Mellitus I Other   . Alcoholism Other   . Hypertension Other   . Hyperlipidemia Other   . Coronary artery disease Other   . Stroke Other   . Osteoarthritis Other   . Migraines Other   . Breast cancer Neg Hx      Review of Systems Positive for chest pain nausea Negative for: General:  chills, fever, night sweats or weight changes.  Cardiovascular: PND orthopnea syncope dizziness  Dermatological skin lesions rashes Respiratory: Cough congestion Urologic: Frequent urination urination at night and hematuria Abdominal: negative for   vomiting, diarrhea, bright red blood per rectum, melena, or hematemesis Neurologic: negative for visual changes, and/or hearing changes  All other systems reviewed and are otherwise negative except as noted above.  Labs: Recent Labs    04/11/18 1725 04/11/18 2150 04/12/18 0417  TROPONINI <0.03 <0.03 <0.03   Lab Results  Component Value Date   WBC 7.1 04/12/2018   HGB 12.1 04/12/2018   HCT 35.0 (L) 04/12/2018   MCV 81.0 04/12/2018   PLT 240 04/12/2018    Recent Labs  Lab 04/09/18 1440  04/12/18 0417  NA 139   < > 135  K 3.0*   < > 2.9*  CL 96   < > 97*  CO2 21   < > 28  BUN 12   < > 13  CREATININE 1.01*   < > 0.86  CALCIUM 10.1   < > 9.8  PROT 7.6  --   --   BILITOT 6.7*  --   --   ALKPHOS 339*  --   --   ALT 711*  --   --   AST 456*  --   --   GLUCOSE 119*   < > 106*   < > = values in this interval not displayed.   Lab Results  Component Value Date   CHOL 205 (H)  11/12/2017   HDL 75 11/12/2017   LDLCALC 114 (H) 11/12/2017   TRIG 81 11/12/2017   No results found for: DDIMER  Radiology/Studies:  Dg Chest 2 View  Result Date: 04/11/2018 CLINICAL DATA:  Chest discomfort and shortness of breath for 1 week EXAM: CHEST -  2 VIEW COMPARISON:  None. FINDINGS: Cardiac shadows within normal limits. The lungs are well aerated bilaterally. No focal infiltrate or sizable effusion is seen. No acute bony abnormality is noted. IMPRESSION: No active cardiopulmonary disease. Electronically Signed   By: Inez Catalina M.D.   On: 04/11/2018 17:53    EKG: Normal sinus rhythm with nonspecific ST changes  Weights: Filed Weights   04/11/18 1723 04/12/18 0015  Weight: 86.2 kg 84.4 kg     Physical Exam: Blood pressure 130/60, pulse 71, temperature 98.6 F (37 C), temperature source Oral, resp. rate 18, height 5\' 3"  (1.6 m), weight 84.4 kg, SpO2 96 %. Body mass index is 32.97 kg/m. General: Well developed, well nourished, in no acute distress. Head eyes ears nose throat: Normocephalic, atraumatic, sclera non-icteric, no xanthomas, nares are without discharge. No apparent thyromegaly and/or mass  Lungs: Normal respiratory effort.  no wheezes, no rales, no rhonchi.  Heart: RRR with normal S1 S2. no murmur gallop, no rub, PMI is normal size and placement, carotid upstroke normal without bruit, jugular venous pressure is normal Abdomen: Soft, non-tender, non-distended with normoactive bowel sounds. No hepatomegaly. No rebound/guarding. No obvious abdominal masses. Abdominal aorta is normal size without bruit Extremities: No edema. no cyanosis, no clubbing, no ulcers  Peripheral : 2+ bilateral upper extremity pulses, 2+ bilateral femoral pulses, 2+ bilateral dorsal pedal pulse Neuro: Alert and oriented. No facial asymmetry. No focal deficit. Moves all extremities spontaneously. Musculoskeletal: Normal muscle tone without kyphosis Psych:  Responds to questions appropriately  with a normal affect.    Assessment: 72 year old female with essential hypertension mixed hyperlipidemia and remote tobacco abuse with atypical symptoms of anginal concerns needing further evaluation and treatment options without current evidence of myocardial infarction or acute coronary syndrome  Plan: 1.  Continue aspirin 2.  Continue antihypertensive medication management following for any significant side effects of those medications 3.  Treadmill EKG stress test with Myoview finding for any exercise intolerance or symptoms of true angina or myocardial ischemia 4.  Further diagnostic testing and treatment options after above  Signed, Corey Skains M.D. [facility] 04/12/2018, 7:49 AM

## 2018-04-12 NOTE — Care Management Note (Signed)
Case Management Note  Patient Details  Name: Lynn Taylor MRN: [id number] Date of Birth: Oct 19, 1945  Subjective/Objective:      Patient placed in observation with chest pain.  Troponins are negative.  Patient presents from home and independent in all adls. No issues accessing medical care, obtaining medications, maintaining housing, utilities and food.   No discharge needs identified at present time.                  Action/Plan:   Expected Discharge Date:  04/12/18               Expected Discharge Plan:  Home/Self Care  In-House Referral:     Discharge planning Services  CM Consult  Post Acute Care Choice:    Choice offered to:     DME Arranged:    DME Agency:     HH Arranged:    HH Agency:     Status of Service:  Completed, signed off  If discussed at [employer] of Stay Meetings, dates discussed:    Additional Comments:  Elza Rafter, RN 04/12/2018, 4:42 PM

## 2018-04-12 NOTE — Telephone Encounter (Signed)
Pt advised for labs

## 2018-04-12 NOTE — Discharge Summary (Signed)
[place] [facility] [place] NAME: Lynn Taylor    MR#:  [id number]  DATE OF BIRTH:  11-05-1945  DATE OF ADMISSION:  04/11/2018 ADMITTING PHYSICIAN: Lance Coon, MD  DATE OF DISCHARGE: 04/12/2018  PRIMARY CARE PHYSICIAN: Lavera Guise, MD    ADMISSION DIAGNOSIS:  Hypokalemia [E87.6] Exertional chest pain [R07.9] Acute cystitis without hematuria [N30.00] Exertional shortness of breath [R06.02]  DISCHARGE DIAGNOSIS:  Principal Problem:   Chest pain Active Problems:   HTN (hypertension), benign   Acquired hypothyroidism   GERD (gastroesophageal reflux disease)   SECONDARY DIAGNOSIS:   Past Medical History:  Diagnosis Date  . GERD (gastroesophageal reflux disease)   . Hypertension   . Hypothyroidism     HOSPITAL COURSE:   72 year old female with a history of essential hypertension who presented with chest pain  1.  Chest pain: Patient has ruled out for ACS underwent cardiac stress test which was negative.  2.  Essential hypertension: Continue Avapro/HCTZ  3.  Hypothyroidism: Continue Synthroid  4.  Recent diagnosis UTI: Patient was on Macrobid 5.  Hypokalemia: This was repleted by pharamcy  DISCHARGE CONDITIONS AND DIET:   Stable hert healthy diet  CONSULTS OBTAINED:  Treatment Team:  Corey Skains, MD  DRUG ALLERGIES:  No Known Allergies  DISCHARGE MEDICATIONS:   Allergies as of 04/12/2018   No Known Allergies     Medication List    TAKE these medications   aspirin EC 81 MG tablet Take 81 mg by mouth daily.   Cholecalciferol 1000 units capsule Take 1,000 Units by mouth daily.   hydrOXYzine 25 MG tablet Commonly known as:  ATARAX/VISTARIL Take 0.5 tablets (12.5 mg total) by mouth at bedtime as needed.   levothyroxine 100 MCG tablet Commonly known as:  SYNTHROID, LEVOTHROID Take 1 tablet (100 mcg total) by mouth daily before breakfast.   losartan 25 MG tablet Commonly known as:  COZAAR Take 1  tablet (25 mg total) by mouth daily.   nitrofurantoin (macrocrystal-monohydrate) 100 MG capsule Commonly known as:  MACROBID Take 1 capsule (100 mg total) by mouth 2 (two) times daily.   olmesartan-hydrochlorothiazide 20-12.5 MG tablet Commonly known as:  BENICAR HCT Take 1 tablet by mouth daily.         Today   CHIEF COMPLAINT:  No chest pain overnight   VITAL SIGNS:  Blood pressure (!) 124/56, pulse 67, temperature 98.2 F (36.8 C), temperature source Oral, resp. rate 16, height 5\' 3"  (1.6 m), weight 84.4 kg, SpO2 95 %.   REVIEW OF SYSTEMS:  Review of Systems  Constitutional: Negative.  Negative for chills, fever and malaise/fatigue.  HENT: Negative.  Negative for ear discharge, ear pain, hearing loss, nosebleeds and sore throat.   Eyes: Negative.  Negative for blurred vision and pain.  Respiratory: Negative.  Negative for cough, hemoptysis, shortness of breath and wheezing.   Cardiovascular: Negative.  Negative for chest pain, palpitations and leg swelling.  Gastrointestinal: Negative.  Negative for abdominal pain, blood in stool, diarrhea, nausea and vomiting.  Genitourinary: Negative.  Negative for dysuria.  Musculoskeletal: Negative.  Negative for back pain.  Skin: Negative.   Neurological: Negative for dizziness, tremors, speech change, focal weakness, seizures and headaches.  Endo/Heme/Allergies: Negative.  Does not bruise/bleed easily.  Psychiatric/Behavioral: Negative.  Negative for depression, hallucinations and suicidal ideas.     PHYSICAL EXAMINATION:  GENERAL:  72 y.o.-year-old patient lying in the bed with no acute distress.  NECK:  Supple, no jugular venous  distention. No thyroid enlargement, no tenderness.  LUNGS: Normal breath sounds bilaterally, no wheezing, rales,rhonchi  No use of accessory muscles of respiration.  CARDIOVASCULAR: S1, S2 normal. No murmurs, rubs, or gallops.  ABDOMEN: Soft, non-tender, non-distended. Bowel sounds present. No  organomegaly or mass.  EXTREMITIES: No pedal edema, cyanosis, or clubbing.  PSYCHIATRIC: The patient is alert and oriented x 3.  SKIN: No obvious rash, lesion, or ulcer.   DATA REVIEW:   CBC Recent Labs  Lab 04/12/18 0417  WBC 7.1  HGB 12.1  HCT 35.0*  PLT 240    Chemistries  Recent Labs  Lab 04/09/18 1440  04/12/18 0417  NA 139   < > 135  K 3.0*   < > 2.9*  CL 96   < > 97*  CO2 21   < > 28  GLUCOSE 119*   < > 106*  BUN 12   < > 13  CREATININE 1.01*   < > 0.86  CALCIUM 10.1   < > 9.8  AST 456*  --   --   ALT 711*  --   --   ALKPHOS 339*  --   --   BILITOT 6.7*  --   --    < > = values in this interval not displayed.    Cardiac Enzymes Recent Labs  Lab 04/11/18 2150 04/12/18 0417 04/12/18 0937  TROPONINI <0.03 <0.03 <0.03    Microbiology Results  @[email or web address] @  RADIOLOGY:  Dg Chest 2 View  Result Date: 04/11/2018 CLINICAL DATA:  Chest discomfort and shortness of breath for 1 week EXAM: CHEST - 2 VIEW COMPARISON:  None. FINDINGS: Cardiac shadows within normal limits. The lungs are well aerated bilaterally. No focal infiltrate or sizable effusion is seen. No acute bony abnormality is noted. IMPRESSION: No active cardiopulmonary disease. Electronically Signed   By: Inez Catalina M.D.   On: 04/11/2018 17:53      Allergies as of 04/12/2018   No Known Allergies     Medication List    TAKE these medications   aspirin EC 81 MG tablet Take 81 mg by mouth daily.   Cholecalciferol 1000 units capsule Take 1,000 Units by mouth daily.   hydrOXYzine 25 MG tablet Commonly known as:  ATARAX/VISTARIL Take 0.5 tablets (12.5 mg total) by mouth at bedtime as needed.   levothyroxine 100 MCG tablet Commonly known as:  SYNTHROID, LEVOTHROID Take 1 tablet (100 mcg total) by mouth daily before breakfast.   losartan 25 MG tablet Commonly known as:  COZAAR Take 1 tablet (25 mg total) by mouth daily.   nitrofurantoin (macrocrystal-monohydrate) 100 MG  capsule Commonly known as:  MACROBID Take 1 capsule (100 mg total) by mouth 2 (two) times daily.   olmesartan-hydrochlorothiazide 20-12.5 MG tablet Commonly known as:  BENICAR HCT Take 1 tablet by mouth daily.           Management plans discussed with the patient and she is in agreement. Stable for discharge   Patient should follow up with pcp  CODE STATUS:     Code Status Orders  (From admission, onward)         Start     Ordered   04/12/18 0016  Full code  Continuous     04/12/18 0015        Code Status History    This patient has a current code status but no historical code status.      TOTAL TIME TAKING CARE OF THIS PATIENT: 38 minutes.  Note: This dictation was prepared with Dragon dictation along with smaller phrase technology. Any transcriptional errors that result from this process are unintentional.  Lux Skilton M.D on 04/12/2018 at 11:17 AM  Between 7am to 6pm - Pager - [phone] After 6pm go to [email or web address] - password [email or web address] [facility]  Office  [phone]  CC: Primary care physician; Lavera Guise, MD

## 2018-04-12 NOTE — Progress Notes (Signed)
Discharge instructions and follow ups explained to pt/ verbalized an understanding / iv and tele removed / will transport off unit when ride arrives

## 2018-04-12 NOTE — Progress Notes (Signed)
[facility] NAME: Lynn Taylor    MR#:  [id number]  DATE OF BIRTH:  04-01-1946  SUBJECTIVE:   Patient presented with chest pain.  No chest pain overnight.  REVIEW OF SYSTEMS:    Review of Systems  Constitutional: Negative for fever, chills weight loss HENT: Negative for ear pain, nosebleeds, congestion, facial swelling, rhinorrhea, neck pain, neck stiffness and ear discharge.   Respiratory: Negative for cough, shortness of breath, wheezing  Cardiovascular: Negative for chest pain, palpitations and leg swelling.  Gastrointestinal: Negative for heartburn, abdominal pain, vomiting, diarrhea or consitpation Genitourinary: Negative for dysuria, urgency, frequency, hematuria Musculoskeletal: Negative for back pain or joint pain Neurological: Negative for dizziness, seizures, syncope, focal weakness,  numbness and headaches.  Hematological: Does not bruise/bleed easily.  Psychiatric/Behavioral: Negative for hallucinations, confusion, dysphoric mood    Tolerating Diet: npo      DRUG ALLERGIES:  No Known Allergies  VITALS:  Blood pressure (!) 124/56, pulse 67, temperature 98.2 F (36.8 C), temperature source Oral, resp. rate 16, height 5\' 3"  (1.6 m), weight 84.4 kg, SpO2 95 %.  PHYSICAL EXAMINATION:  Constitutional: Appears well-developed and well-nourished. No distress. HENT: Normocephalic. Marland Kitchen Oropharynx is clear and moist.  Eyes: Conjunctivae and EOM are normal. PERRLA, no scleral icterus.  Neck: Normal ROM. Neck supple. No JVD. No tracheal deviation. CVS: RRR, S1/S2 +, no murmurs, no gallops, no carotid bruit.  Pulmonary: Effort and breath sounds normal, no stridor, rhonchi, wheezes, rales.  Abdominal: Soft. BS +,  no distension, tenderness, rebound or guarding.  Musculoskeletal: Normal range of motion. No edema and no tenderness.  Neuro: Alert. CN 2-12 grossly intact. No focal deficits. Skin: Skin is warm and dry. No rash  noted. Psychiatric: Normal mood and affect.      LABORATORY PANEL:   CBC Recent Labs  Lab 04/12/18 0417  WBC 7.1  HGB 12.1  HCT 35.0*  PLT 240   ------------------------------------------------------------------------------------------------------------------  Chemistries  Recent Labs  Lab 04/09/18 1440  04/12/18 0417  NA 139   < > 135  K 3.0*   < > 2.9*  CL 96   < > 97*  CO2 21   < > 28  GLUCOSE 119*   < > 106*  BUN 12   < > 13  CREATININE 1.01*   < > 0.86  CALCIUM 10.1   < > 9.8  AST 456*  --   --   ALT 711*  --   --   ALKPHOS 339*  --   --   BILITOT 6.7*  --   --    < > = values in this interval not displayed.   ------------------------------------------------------------------------------------------------------------------  Cardiac Enzymes Recent Labs  Lab 04/11/18 2150 04/12/18 0417 04/12/18 0937  TROPONINI <0.03 <0.03 <0.03   ------------------------------------------------------------------------------------------------------------------  RADIOLOGY:  Dg Chest 2 View  Result Date: 04/11/2018 CLINICAL DATA:  Chest discomfort and shortness of breath for 1 week EXAM: CHEST - 2 VIEW COMPARISON:  None. FINDINGS: Cardiac shadows within normal limits. The lungs are well aerated bilaterally. No focal infiltrate or sizable effusion is seen. No acute bony abnormality is noted. IMPRESSION: No active cardiopulmonary disease. Electronically Signed   By: Inez Catalina M.D.   On: 04/11/2018 17:53     ASSESSMENT AND PLAN:    72 year old female with a history of essential hypertension who presented with chest pain  1.  Chest pain: Patient has ruled out for ACS and is plan for stress  test.  If the stress test is negative patient may be discharged home.  Patient was eval by cardiology this morning.    2.  Essential hypertension: Continue Avapro/HCTZ  3.  Hypothyroidism: Continue Synthroid  4.  Recent diagnosis UTI: Patient was on Macrobid 5.  Hypokalemia:  Replete and check MG    Management plans discussed with the patient and she is in agreement.  CODE STATUS: full  TOTAL TIME TAKING CARE OF THIS PATIENT: 30 minutes.     POSSIBLE D/C today if stress test negative,    Albert Devaul M.D on 04/12/2018 at 11:13 AM  Between 7am to 6pm - Pager - [phone] After 6pm go to [email or web address] - password [email or web address] [facility]  Office  [phone]  CC: Primary care physician; Lavera Guise, MD  Note: This dictation was prepared with Dragon dictation along with smaller phrase technology. Any transcriptional errors that result from this process are unintentional.

## 2018-04-12 NOTE — Plan of Care (Signed)
  Problem: Clinical Measurements: Goal: Ability to maintain clinical measurements within normal limits will improve Outcome: Progressing Note:  Potassium went from 2.5 --> 2.9 after receiving PO & IV replacement Goal: Diagnostic test results will improve Outcome: Progressing   Problem: Activity: Goal: Risk for activity intolerance will decrease Outcome: Progressing Note:  Up in room independently. Husband at bedside, tolerating well   Problem: Coping: Goal: Level of anxiety will decrease Outcome: Progressing   Problem: Elimination: Goal: Will not experience complications related to urinary retention Outcome: Progressing Note:  Urine still very dark, but pt states it has been that way for a few days with her UTI   Problem: Pain Managment: Goal: General experience of comfort will improve Outcome: Progressing Note:  No complaints of pain this shift   Problem: Safety: Goal: Ability to remain free from injury will improve Outcome: Progressing   Problem: Skin Integrity: Goal: Risk for impaired skin integrity will decrease Outcome: Progressing   Problem: Education: Goal: Knowledge of General Education information will improve Description Including pain rating scale, medication(s)/side effects and non-pharmacologic comfort measures Outcome: Completed/Met   Problem: Clinical Measurements: Goal: Respiratory complications will improve Outcome: Not Applicable

## 2018-04-12 NOTE — Telephone Encounter (Signed)
Pt husband advised that her iron is very high need referral for hematology consult  And beth already done referral

## 2018-04-12 NOTE — Progress Notes (Signed)
*  PRELIMINARY RESULTS* Echocardiogram 2D Echocardiogram has been performed.  Sherrie Sport 04/12/2018, 2:36 PM

## 2018-04-12 NOTE — Telephone Encounter (Signed)
-----   Message from Kendell Bane, NP sent at 04/12/2018  1:13 PM EST ----- Regarding: Hematology consult Pt's Iron is very high, please tell her I have put in a consult for her to see hematology.

## 2018-04-12 NOTE — Progress Notes (Signed)
Pt's Ferritin level is severely increased. Will send to Hematology.

## 2018-04-13 LAB — URINE CULTURE

## 2018-04-13 LAB — ECHOCARDIOGRAM COMPLETE
Height: 63 in
Weight: 2977.6 oz

## 2018-04-14 ENCOUNTER — Encounter: Payer: Self-pay | Admitting: *Deleted

## 2018-04-14 ENCOUNTER — Other Ambulatory Visit: Payer: Self-pay

## 2018-04-14 ENCOUNTER — Encounter: Payer: Self-pay | Admitting: Oncology

## 2018-04-14 ENCOUNTER — Inpatient Hospital Stay: Payer: Medicare Other | Attending: Oncology | Admitting: Oncology

## 2018-04-14 VITALS — BP 129/76 | HR 74 | Temp 96.4°F | Resp 18 | Ht 63.5 in | Wt 185.4 lb

## 2018-04-14 DIAGNOSIS — Z7982 Long term (current) use of aspirin: Secondary | ICD-10-CM

## 2018-04-14 DIAGNOSIS — R5383 Other fatigue: Secondary | ICD-10-CM | POA: Diagnosis not present

## 2018-04-14 DIAGNOSIS — K831 Obstruction of bile duct: Secondary | ICD-10-CM | POA: Insufficient documentation

## 2018-04-14 DIAGNOSIS — I10 Essential (primary) hypertension: Secondary | ICD-10-CM | POA: Diagnosis not present

## 2018-04-14 DIAGNOSIS — R17 Unspecified jaundice: Secondary | ICD-10-CM | POA: Diagnosis not present

## 2018-04-14 DIAGNOSIS — K219 Gastro-esophageal reflux disease without esophagitis: Secondary | ICD-10-CM

## 2018-04-14 DIAGNOSIS — R531 Weakness: Secondary | ICD-10-CM | POA: Insufficient documentation

## 2018-04-14 DIAGNOSIS — R110 Nausea: Secondary | ICD-10-CM | POA: Insufficient documentation

## 2018-04-14 DIAGNOSIS — R7989 Other specified abnormal findings of blood chemistry: Secondary | ICD-10-CM

## 2018-04-14 DIAGNOSIS — C250 Malignant neoplasm of head of pancreas: Secondary | ICD-10-CM | POA: Diagnosis not present

## 2018-04-14 DIAGNOSIS — Z87891 Personal history of nicotine dependence: Secondary | ICD-10-CM

## 2018-04-14 DIAGNOSIS — E039 Hypothyroidism, unspecified: Secondary | ICD-10-CM | POA: Diagnosis not present

## 2018-04-14 DIAGNOSIS — Z87440 Personal history of urinary (tract) infections: Secondary | ICD-10-CM

## 2018-04-14 DIAGNOSIS — E871 Hypo-osmolality and hyponatremia: Secondary | ICD-10-CM | POA: Diagnosis not present

## 2018-04-14 DIAGNOSIS — Z79899 Other long term (current) drug therapy: Secondary | ICD-10-CM | POA: Diagnosis not present

## 2018-04-14 NOTE — Progress Notes (Signed)
Patient here for initial visit. Currently on antibiotic for UTI. Patient states that she has an unpleasant smell that makes her nauseated and prevent her from eating.

## 2018-04-15 LAB — COMPREHENSIVE METABOLIC PANEL
ALT: 711 IU/L (ref 0–32)
AST: 456 IU/L — ABNORMAL HIGH (ref 0–40)
Albumin/Globulin Ratio: 1.3 (ref 1.2–2.2)
Albumin: 4.3 g/dL (ref 3.5–4.8)
Alkaline Phosphatase: 339 IU/L — ABNORMAL HIGH (ref 39–117)
BUN/Creatinine Ratio: 12 (ref 12–28)
BUN: 12 mg/dL (ref 8–27)
Bilirubin Total: 6.7 mg/dL — ABNORMAL HIGH (ref 0.0–1.2)
CO2: 21 mmol/L (ref 20–29)
Calcium: 10.1 mg/dL (ref 8.7–10.3)
Chloride: 96 mmol/L (ref 96–106)
Creatinine, Ser: 1.01 mg/dL — ABNORMAL HIGH (ref 0.57–1.00)
GFR calc Af Amer: 64 mL/min/{1.73_m2} (ref >59)
GFR calc non Af Amer: 56 mL/min/{1.73_m2} — ABNORMAL LOW (ref >59)
Globulin, Total: 3.3 g/dL (ref 1.5–4.5)
Glucose: 119 mg/dL — ABNORMAL HIGH (ref 65–99)
Potassium: 3 mmol/L — ABNORMAL LOW (ref 3.5–5.2)
Sodium: 139 mmol/L (ref 134–144)
Total Protein: 7.6 g/dL (ref 6.0–8.5)

## 2018-04-15 LAB — URINE CULTURE

## 2018-04-15 LAB — VITAMIN D 1,25 DIHYDROXY
Vitamin D 1, 25 (OH)2 Total: 68 pg/mL — ABNORMAL HIGH
Vitamin D2 1, 25 (OH)2: <10 pg/mL
Vitamin D3 1, 25 (OH)2: 64 pg/mL

## 2018-04-15 LAB — IRON,TIBC AND FERRITIN PANEL
Ferritin: 1877 ng/mL — ABNORMAL HIGH (ref 15–150)
Iron Saturation: 29 % (ref 15–55)
Iron: 97 ug/dL (ref 27–139)
Total Iron Binding Capacity: 340 ug/dL (ref 250–450)
UIBC: 243 ug/dL (ref 118–369)

## 2018-04-15 LAB — B12 AND FOLATE PANEL
Folate: 14.3 ng/mL (ref >3.0)
Vitamin B-12: 1564 pg/mL — ABNORMAL HIGH (ref 232–1245)

## 2018-04-16 NOTE — Progress Notes (Signed)
Hematology/Oncology Consult note [facility] Telephone:([phone] [phone]   Patient Care Team: Lavera Guise, MD as PCP - General (Internal Medicine)  REFERRING PROVIDER: Evie Lacks CHIEF COMPLAINTS/REASON FOR VISIT:  Evaluation of elevated ferritin  HISTORY OF PRESENTING ILLNESS:  Lynn Taylor is a  72 y.o.  female with PMH listed below who was referred to me for evaluation of evaluation of elevated ferritin. Patient recently had lab work-up done on 04/09/2018 which showed ferritin level 1877, iron 97, TIBC 340, iron saturation 29, 04/11/2018 CBC showed hemoglobin 13.2, MCV 81, WBC 7.6, platelet counts 246,000. Patient was referred to hematology for further evaluation of elevated ferritin.  Patient reports recent hospitalization from 04/11/2018 to 04/12/2018.  She presented emergency room with complaints of chest pain.  Ruled out ACS.  Patient also had a recent UTI infection and has been on Macrobid. Denies any family history of hereditary hemochromatosis. Denies shortness of breath Denies joint pain. Reports feeling well today. Review of Systems  Constitutional: Negative for chills, fever, malaise/fatigue and weight loss.  HENT: Negative for nosebleeds and sore throat.   Eyes: Negative for double vision, photophobia and redness.  Respiratory: Negative for cough, shortness of breath and wheezing.   Cardiovascular: Negative for chest pain, palpitations and orthopnea.  Gastrointestinal: Negative for abdominal pain, blood in stool, nausea and vomiting.  Genitourinary: Negative for dysuria.  Musculoskeletal: Negative for back pain, myalgias and neck pain.  Skin: Negative for itching and rash.  Neurological: Negative for dizziness, tingling and tremors.  Endo/Heme/Allergies: Negative for environmental allergies. Does not bruise/bleed easily.  Psychiatric/Behavioral: Negative for depression.    MEDICAL HISTORY:  Past Medical History:    Diagnosis Date  . GERD (gastroesophageal reflux disease)   . Hypertension   . Hypothyroidism     SURGICAL HISTORY: Past Surgical History:  Procedure Laterality Date  . CHOLECYSTECTOMY    . COLONOSCOPY N/A 12/02/2016   Procedure: COLONOSCOPY;  Surgeon: Lollie Sails, MD;  Location: [facility] ENDOSCOPY;  Service: Endoscopy;  Laterality: N/A;    SOCIAL HISTORY: Social History   Socioeconomic History  . Marital status: Married    Spouse name: Not on file  . Number of children: Not on file  . Years of education: Not on file  . Highest education level: Not on file  Occupational History  . Not on file  Social Needs  . Financial resource strain: Not on file  . Food insecurity:    Worry: Not on file    Inability: Not on file  . Transportation needs:    Medical: Not on file    Non-medical: Not on file  Tobacco Use  . Smoking status: Former Smoker    Last attempt to quit: 04/14/1988    Years since quitting: 30.0  . Smokeless tobacco: Never Used  Substance and Sexual Activity  . Alcohol use: Yes    Comment: social drinker  . Drug use: No  . Sexual activity: Not on file  Lifestyle  . Physical activity:    Days per week: Not on file    Minutes per session: Not on file  . Stress: Not on file  Relationships  . Social connections:    Talks on phone: Not on file    Gets together: Not on file    Attends religious service: Not on file    Active member of club or organization: Not on file    Attends meetings of clubs or organizations: Not on file    Relationship status:  Not on file  . Intimate partner violence:    Fear of current or ex partner: Not on file    Emotionally abused: Not on file    Physically abused: Not on file    Forced sexual activity: Not on file  Other Topics Concern  . Not on file  Social History Narrative  . Not on file    FAMILY HISTORY: Family History  Problem Relation Age of Onset  . Diabetes Mellitus I Other   . Alcoholism Other   .  Hypertension Other   . Hyperlipidemia Other   . Coronary artery disease Other   . Stroke Other   . Osteoarthritis Other   . Migraines Other   . Heart Problems Mother   . Stroke Sister   . CAD Brother   . Stroke Brother   . Breast cancer Neg Hx     ALLERGIES:  has No Known Allergies.  MEDICATIONS:  Current Outpatient Medications  Medication Sig Dispense Refill  . aspirin EC 81 MG tablet Take 81 mg by mouth daily.    . Cholecalciferol 1000 units capsule Take 1,000 Units by mouth daily.    . hydrOXYzine (ATARAX/VISTARIL) 25 MG tablet Take 0.5 tablets (12.5 mg total) by mouth at bedtime as needed. 15 tablet 0  . levothyroxine (SYNTHROID, LEVOTHROID) 100 MCG tablet Take 1 tablet (100 mcg total) by mouth daily before breakfast. 90 tablet 4  . losartan (COZAAR) 25 MG tablet Take 1 tablet (25 mg total) by mouth daily. 90 tablet 4  . nitrofurantoin, macrocrystal-monohydrate, (MACROBID) 100 MG capsule Take 1 capsule (100 mg total) by mouth 2 (two) times daily. 20 capsule 0  . olmesartan-hydrochlorothiazide (BENICAR HCT) 20-12.5 MG tablet Take 1 tablet by mouth daily. 30 tablet 5  . Simethicone (GAS-X EXTRA STRENGTH) 125 MG CAPS Take 125 mg by mouth daily as needed.     No current facility-administered medications for this visit.      PHYSICAL EXAMINATION: ECOG PERFORMANCE STATUS: 0 - Asymptomatic Vitals:   04/14/18 1525  BP: 129/76  Pulse: 74  Resp: 18  Temp: (!) 96.4 F (35.8 C)   Filed Weights   04/14/18 1525  Weight: 185 lb 6.4 oz (84.1 kg)    Physical Exam  Constitutional: She is oriented to person, place, and time. No distress.  HENT:  Head: Normocephalic and atraumatic.  Mouth/Throat: Oropharynx is clear and moist.  Eyes: Pupils are equal, round, and reactive to light. EOM are normal. No scleral icterus.  Neck: Normal range of motion. Neck supple.  Cardiovascular: Normal rate, regular rhythm and normal heart sounds.  Pulmonary/Chest: Effort normal. No respiratory  distress. She has no wheezes.  Abdominal: Soft. Bowel sounds are normal. She exhibits no distension and no mass. There is no tenderness.  Musculoskeletal: Normal range of motion. She exhibits no edema or deformity.  Neurological: She is alert and oriented to person, place, and time. No cranial nerve deficit. Coordination normal.  Skin: Skin is warm and dry. No rash noted. No erythema.  Psychiatric: She has a normal mood and affect. Her behavior is normal. Thought content normal.     LABORATORY DATA:  I have reviewed the data as listed Lab Results  Component Value Date   WBC 7.1 04/12/2018   HGB 12.1 04/12/2018   HCT 35.0 (L) 04/12/2018   MCV 81.0 04/12/2018   PLT 240 04/12/2018   Recent Labs    11/12/17 0837 04/09/18 1440 04/11/18 1725 04/12/18 0417  NA 137 139 135 135  K 3.2* 3.0* 2.5* 2.9*  CL 97 96 93* 97*  CO2 25 21 27 28   GLUCOSE 97 119* 122* 106*  BUN 25 12 13 13   CREATININE 0.95 1.01* 1.02* 0.86  CALCIUM 10.0 10.1 10.6* 9.8  GFRNONAA 60 56* 54* >60  GFRAA 70 64 >60 >60  PROT 7.7 7.6  --   --   ALBUMIN 4.5 4.3  --   --   AST 12 456*  --   --   ALT 10 711*  --   --   ALKPHOS 48 339*  --   --   BILITOT 0.6 6.7*  --   --    Iron/TIBC/Ferritin/ %Sat    Component Value Date/Time   IRON 97 04/09/2018 1440   TIBC 340 04/09/2018 1440   FERRITIN 1,877 (H) 04/09/2018 1440   IRONPCTSAT 29 04/09/2018 1440        ASSESSMENT & PLAN:  1. Elevated ferritin    Labs reviewed and discussed with patient. Elevated ferritin can be secondary to acute inflammation/infection, chronic liver disease, alcohol use. Iron saturation 29, in proportion related to ferritin level.  Normal liver function Given patient's recent UTI, I recommend patient to wait couple of weeks before repeating labs. We will check ferritin, iron, TIBC, check hemochromatosis DNA PCR.  Obtain ultrasound right upper quadrant.  Orders Placed This Encounter  Procedures  . US Abdomen Limited    Standing  Status:   Future    Standing Expiration Date:   04/14/2019    Order Specific Question:   Reason for Exam (SYMPTOM  OR DIAGNOSIS REQUIRED)    Answer:   high ferritin.    Order Specific Question:   Preferred imaging location?    Answer:   [facility]  . Iron and TIBC    Standing Status:   Future    Standing Expiration Date:   04/15/2019  . Ferritin    Standing Status:   Future    Standing Expiration Date:   04/15/2019  . Hemochromatosis DNA-PCR(c282y,h63d)    Standing Status:   Future    Standing Expiration Date:   04/14/2019    All questions were answered. The patient knows to call the clinic with any problems questions or concerns.  Return of visit: 5 weeks Thank you for this kind referral and the opportunity to participate in the care of this patient. A copy of today's note is routed to referring provider  Total face to face encounter time for this patient visit was 45 min. >50% of the time was  spent in counseling and coordination of care.    Earlie Server, MD, PhD Hematology Oncology [facility] at [facility] Pager- [phone] 04/16/2018

## 2018-04-20 ENCOUNTER — Encounter: Payer: Self-pay | Admitting: Adult Health

## 2018-04-20 ENCOUNTER — Ambulatory Visit (INDEPENDENT_AMBULATORY_CARE_PROVIDER_SITE_OTHER): Payer: Medicare Other | Admitting: Adult Health

## 2018-04-20 VITALS — BP 126/70 | HR 85 | Resp 16 | Ht 63.0 in | Wt 186.0 lb

## 2018-04-20 DIAGNOSIS — R17 Unspecified jaundice: Secondary | ICD-10-CM

## 2018-04-20 DIAGNOSIS — R5383 Other fatigue: Secondary | ICD-10-CM | POA: Diagnosis not present

## 2018-04-20 DIAGNOSIS — I10 Essential (primary) hypertension: Secondary | ICD-10-CM

## 2018-04-20 DIAGNOSIS — R7989 Other specified abnormal findings of blood chemistry: Secondary | ICD-10-CM | POA: Diagnosis not present

## 2018-04-20 NOTE — Patient Instructions (Signed)

## 2018-04-20 NOTE — Progress Notes (Signed)
[facility] [place], [place] [address]  Internal MEDICINE  Office Visit Note  Patient Name: Lynn Taylor  [id number]  [id number]  Date of Service: 04/20/2018  Chief Complaint  Patient presents with  . Hospitalization Follow-up    potassium was real low     HPI Patient is here for follow-up from hospital admission  Patient was having chest pain shortness of breath she went to [facility] emergency department.  She was later admitted to the hospital was found that her potassium level was 2.9.  She was given p.o. and IV potassium replacement as well as a cardiac work-up that was found to be negative.  While she was in the hospital lab work resulted from our last visit that showed her ferritin level 1877.  Hematology consult was placed and patient has seen hematology.  They are working her up she has an abdominal ultrasound scheduled and she will see them again in a few weeks.  Today however she comes in continue to complain of all over body pruritus as well as eye itching.  It is noted that her sclera are yellow.    Current Medication: Outpatient Encounter Medications as of 04/20/2018  Medication Sig  . aspirin EC 81 MG tablet Take 81 mg by mouth daily.  . Cholecalciferol 1000 units capsule Take 1,000 Units by mouth daily.  . hydrOXYzine (ATARAX/VISTARIL) 25 MG tablet Take 0.5 tablets (12.5 mg total) by mouth at bedtime as needed.  Marland Kitchen levothyroxine (SYNTHROID, LEVOTHROID) 100 MCG tablet Take 1 tablet (100 mcg total) by mouth daily before breakfast.  . losartan (COZAAR) 25 MG tablet Take 1 tablet (25 mg total) by mouth daily.  Marland Kitchen olmesartan-hydrochlorothiazide (BENICAR HCT) 20-12.5 MG tablet Take 1 tablet by mouth daily.  . [DISCONTINUED] nitrofurantoin, macrocrystal-monohydrate, (MACROBID) 100 MG capsule Take 1 capsule (100 mg total) by mouth 2 (two) times daily. (Patient not taking: Reported on 04/20/2018)  . [DISCONTINUED] Simethicone (GAS-X EXTRA STRENGTH) 125 MG  CAPS Take 125 mg by mouth daily as needed.   No facility-administered encounter medications on file as of 04/20/2018.     Surgical History: Past Surgical History:  Procedure Laterality Date  . CHOLECYSTECTOMY    . COLONOSCOPY N/A 12/02/2016   Procedure: COLONOSCOPY;  Surgeon: Lollie Sails, MD;  Location: [facility] ENDOSCOPY;  Service: Endoscopy;  Laterality: N/A;    Medical History: Past Medical History:  Diagnosis Date  . GERD (gastroesophageal reflux disease)   . Hypertension   . Hypothyroidism     Family History: Family History  Problem Relation Age of Onset  . Diabetes Mellitus I Other   . Alcoholism Other   . Hypertension Other   . Hyperlipidemia Other   . Coronary artery disease Other   . Stroke Other   . Osteoarthritis Other   . Migraines Other   . Heart Problems Mother   . Stroke Sister   . CAD Brother   . Stroke Brother   . Breast cancer Neg Hx     Social History   Socioeconomic History  . Marital status: Married    Spouse name: Not on file  . Number of children: Not on file  . Years of education: Not on file  . Highest education level: Not on file  Occupational History  . Not on file  Social Needs  . Financial resource strain: Not on file  . Food insecurity:    Worry: Not on file    Inability: Not on file  . Transportation needs:  Medical: Not on file    Non-medical: Not on file  Tobacco Use  . Smoking status: Former Smoker    Last attempt to quit: 04/14/1988    Years since quitting: 30.0  . Smokeless tobacco: Never Used  Substance and Sexual Activity  . Alcohol use: Yes    Comment: social drinker  . Drug use: No  . Sexual activity: Not on file  Lifestyle  . Physical activity:    Days per week: Not on file    Minutes per session: Not on file  . Stress: Not on file  Relationships  . Social connections:    Talks on phone: Not on file    Gets together: Not on file    Attends religious service: Not on file    Active member of club or  organization: Not on file    Attends meetings of clubs or organizations: Not on file    Relationship status: Not on file  . Intimate partner violence:    Fear of current or ex partner: Not on file    Emotionally abused: Not on file    Physically abused: Not on file    Forced sexual activity: Not on file  Other Topics Concern  . Not on file  Social History Narrative  . Not on file      Review of Systems  Constitutional: Positive for fatigue.  Eyes: Positive for itching.    Vital Signs: BP 126/70 (BP Location: Left Arm, Patient Position: Sitting, Cuff Size: Normal)   Pulse 85   Resp 16   Ht 5\' 3"  (1.6 m)   Wt 186 lb (84.4 kg)   SpO2 98%   BMI 32.95 kg/m    Physical Exam  Constitutional: She is oriented to person, place, and time. She appears well-developed and well-nourished. No distress.  HENT:  Head: Normocephalic and atraumatic.  Mouth/Throat: Oropharynx is clear and moist. No oropharyngeal exudate.  Eyes: Pupils are equal, round, and reactive to light. EOM are normal.  Yellow Sclera  Neck: Normal range of motion. Neck supple. No JVD present. No tracheal deviation present. No thyromegaly present.  Cardiovascular: Normal rate, regular rhythm and normal heart sounds. Exam reveals no gallop and no friction rub.  No murmur heard. Pulmonary/Chest: Effort normal and breath sounds normal. No respiratory distress. She has no wheezes. She has no rales. She exhibits no tenderness.  Abdominal: Soft. There is no tenderness. There is no guarding.  Musculoskeletal: Normal range of motion.  Lymphadenopathy:    She has no cervical adenopathy.  Neurological: She is alert and oriented to person, place, and time. No cranial nerve deficit.  Skin: Skin is warm and dry. She is not diaphoretic.  Psychiatric: She has a normal mood and affect. Her behavior is normal. Judgment and thought content normal.  Nursing note and vitals reviewed.   Assessment/Plan:  1. Jaundice Patient has  scleral jaundice, will order liver labs as well as hepatitis panels to evaluate for autoimmune issues. - Comprehensive metabolic panel - Hepatic function panel - Hepatitis A antibody, IgM - Hepatitis B Core Antibody, IgM - HCV Ab w Reflex to Quant PCR - Mitochondrial antibodies - ANA w/Reflex  2. High serum ferritin Patient is being followed by hematology and had abdominal ultrasound scheduled in the next week.  3. Fatigue, unspecified type Patient continues to report fatigue, she is awaiting full work-up from hematology in hopes that fatigue will resolve.  4. HTN (hypertension), benign Patient blood pressure is stable at this time continue  medications as prescribed. General Counseling: Aylene verbalizes understanding of the findings of todays visit and agrees with plan of treatment. I have discussed any further diagnostic evaluation that may be needed or ordered today. We also reviewed her medications today. she has been encouraged to call the office with any questions or concerns that should arise related to todays visit.    Orders Placed This Encounter  Procedures  . Comprehensive metabolic panel  . Hepatic function panel  . Hepatitis A antibody, IgM  . Hepatitis B Core Antibody, IgM  . HCV Ab w Reflex to Quant PCR  . Mitochondrial antibodies  . ANA w/Reflex    No orders of the defined types were placed in this encounter.   Time spent: 30 Minutes   This patient was seen by Orson Gear AGNP-C in Collaboration with Dr Lavera Guise as a part of collaborative care agreement     Kendell Bane AGNP-C Internal medicine

## 2018-04-21 DIAGNOSIS — R17 Unspecified jaundice: Secondary | ICD-10-CM | POA: Diagnosis not present

## 2018-04-22 ENCOUNTER — Telehealth: Payer: Self-pay | Admitting: Adult Health

## 2018-04-22 LAB — COMPREHENSIVE METABOLIC PANEL
ALT: 121 IU/L — ABNORMAL HIGH (ref 0–32)
AST: 61 IU/L — ABNORMAL HIGH (ref 0–40)
Albumin/Globulin Ratio: 1 — ABNORMAL LOW (ref 1.2–2.2)
Albumin: 3.9 g/dL (ref 3.5–4.8)
Alkaline Phosphatase: 275 IU/L — ABNORMAL HIGH (ref 39–117)
BUN/Creatinine Ratio: 21 (ref 12–28)
BUN: 23 mg/dL (ref 8–27)
Bilirubin Total: 11.7 mg/dL — ABNORMAL HIGH (ref 0.0–1.2)
CO2: 22 mmol/L (ref 20–29)
Calcium: 10.6 mg/dL — ABNORMAL HIGH (ref 8.7–10.3)
Chloride: 100 mmol/L (ref 96–106)
Creatinine, Ser: 1.11 mg/dL — ABNORMAL HIGH (ref 0.57–1.00)
GFR calc Af Amer: 57 mL/min/{1.73_m2} — ABNORMAL LOW (ref >59)
GFR calc non Af Amer: 50 mL/min/{1.73_m2} — ABNORMAL LOW (ref >59)
Globulin, Total: 3.8 g/dL (ref 1.5–4.5)
Glucose: 95 mg/dL (ref 65–99)
Potassium: 3.8 mmol/L (ref 3.5–5.2)
Sodium: 137 mmol/L (ref 134–144)
Total Protein: 7.7 g/dL (ref 6.0–8.5)

## 2018-04-22 LAB — ANA W/REFLEX: Anti Nuclear Antibody(ANA): NEGATIVE

## 2018-04-22 LAB — HCV AB W REFLEX TO QUANT PCR: HCV Ab: <0.1 s/co ratio (ref 0.0–0.9)

## 2018-04-22 LAB — MITOCHONDRIAL ANTIBODIES: Mitochondrial Ab: <20 Units (ref 0.0–20.0)

## 2018-04-22 LAB — HEPATITIS B CORE ANTIBODY, IGM: Hep B C IgM: NEGATIVE

## 2018-04-22 LAB — HCV INTERPRETATION

## 2018-04-22 LAB — HEPATITIS A ANTIBODY, IGM: Hep A IgM: NEGATIVE

## 2018-04-22 LAB — HEPATIC FUNCTION PANEL: Bilirubin, Direct: 8.84 mg/dL — ABNORMAL HIGH (ref 0.00–0.40)

## 2018-04-23 NOTE — Telephone Encounter (Signed)
Patient was called and informed of lab results and to follow up with hematologist and keep appointment for ultrasound.

## 2018-04-27 ENCOUNTER — Other Ambulatory Visit: Payer: Self-pay | Admitting: Oncology

## 2018-04-27 ENCOUNTER — Telehealth: Payer: Self-pay | Admitting: *Deleted

## 2018-04-27 DIAGNOSIS — R7989 Other specified abnormal findings of blood chemistry: Secondary | ICD-10-CM

## 2018-04-27 NOTE — Telephone Encounter (Signed)
[place] Department called asking if the order for US Abdomen Complete is what you want or with diagnosis provided, you may want to order US abdomen Limited RUQ. Please return her call Verdis Frederickson [phone]

## 2018-04-27 NOTE — Telephone Encounter (Signed)
Orders changed.   Thanks

## 2018-04-28 ENCOUNTER — Telehealth: Payer: Self-pay | Admitting: *Deleted

## 2018-04-28 ENCOUNTER — Other Ambulatory Visit: Payer: Self-pay

## 2018-04-28 ENCOUNTER — Ambulatory Visit: Payer: Medicare Other

## 2018-04-28 ENCOUNTER — Inpatient Hospital Stay
Admission: EM | Admit: 2018-04-28 | Discharge: 2018-05-02 | DRG: 446 | Disposition: A | Discharge status: Home / Self Care | Payer: Medicare Other | Attending: Family Medicine | Admitting: Family Medicine

## 2018-04-28 ENCOUNTER — Ambulatory Visit
Admission: RE | Admit: 2018-04-28 | Discharge: 2018-04-28 | Disposition: A | Discharge status: Home / Self Care | Payer: Medicare Other | Source: Ambulatory Visit | Attending: Oncology | Admitting: Oncology

## 2018-04-28 DIAGNOSIS — D490 Neoplasm of unspecified behavior of digestive system: Secondary | ICD-10-CM | POA: Diagnosis present

## 2018-04-28 DIAGNOSIS — K838 Other specified diseases of biliary tract: Secondary | ICD-10-CM

## 2018-04-28 DIAGNOSIS — I10 Essential (primary) hypertension: Secondary | ICD-10-CM | POA: Diagnosis present

## 2018-04-28 DIAGNOSIS — K831 Obstruction of bile duct: Secondary | ICD-10-CM | POA: Diagnosis not present

## 2018-04-28 DIAGNOSIS — E039 Hypothyroidism, unspecified: Secondary | ICD-10-CM | POA: Diagnosis not present

## 2018-04-28 DIAGNOSIS — Z8249 Family history of ischemic heart disease and other diseases of the circulatory system: Secondary | ICD-10-CM | POA: Diagnosis not present

## 2018-04-28 DIAGNOSIS — Z9049 Acquired absence of other specified parts of digestive tract: Secondary | ICD-10-CM | POA: Diagnosis not present

## 2018-04-28 DIAGNOSIS — L298 Other pruritus: Secondary | ICD-10-CM | POA: Diagnosis not present

## 2018-04-28 DIAGNOSIS — R17 Unspecified jaundice: Secondary | ICD-10-CM | POA: Diagnosis not present

## 2018-04-28 DIAGNOSIS — Z7982 Long term (current) use of aspirin: Secondary | ICD-10-CM | POA: Diagnosis not present

## 2018-04-28 DIAGNOSIS — B359 Dermatophytosis, unspecified: Secondary | ICD-10-CM | POA: Diagnosis present

## 2018-04-28 DIAGNOSIS — Z833 Family history of diabetes mellitus: Secondary | ICD-10-CM | POA: Diagnosis not present

## 2018-04-28 DIAGNOSIS — K219 Gastro-esophageal reflux disease without esophagitis: Secondary | ICD-10-CM | POA: Diagnosis present

## 2018-04-28 DIAGNOSIS — Z6832 Body mass index (BMI) 32.0-32.9, adult: Secondary | ICD-10-CM | POA: Diagnosis not present

## 2018-04-28 DIAGNOSIS — Z79890 Hormone replacement therapy (postmenopausal): Secondary | ICD-10-CM

## 2018-04-28 DIAGNOSIS — Z87891 Personal history of nicotine dependence: Secondary | ICD-10-CM | POA: Diagnosis not present

## 2018-04-28 DIAGNOSIS — Z823 Family history of stroke: Secondary | ICD-10-CM

## 2018-04-28 DIAGNOSIS — E876 Hypokalemia: Secondary | ICD-10-CM | POA: Diagnosis not present

## 2018-04-28 DIAGNOSIS — E669 Obesity, unspecified: Secondary | ICD-10-CM | POA: Diagnosis present

## 2018-04-28 DIAGNOSIS — R7989 Other specified abnormal findings of blood chemistry: Secondary | ICD-10-CM

## 2018-04-28 DIAGNOSIS — C240 Malignant neoplasm of extrahepatic bile duct: Secondary | ICD-10-CM | POA: Diagnosis not present

## 2018-04-28 DIAGNOSIS — K8050 Calculus of bile duct without cholangitis or cholecystitis without obstruction: Secondary | ICD-10-CM | POA: Diagnosis not present

## 2018-04-28 DIAGNOSIS — Z79899 Other long term (current) drug therapy: Secondary | ICD-10-CM | POA: Diagnosis not present

## 2018-04-28 DIAGNOSIS — R933 Abnormal findings on diagnostic imaging of other parts of digestive tract: Secondary | ICD-10-CM | POA: Diagnosis not present

## 2018-04-28 DIAGNOSIS — R945 Abnormal results of liver function studies: Secondary | ICD-10-CM

## 2018-04-28 DIAGNOSIS — D649 Anemia, unspecified: Secondary | ICD-10-CM | POA: Diagnosis present

## 2018-04-28 DIAGNOSIS — R935 Abnormal findings on diagnostic imaging of other abdominal regions, including retroperitoneum: Secondary | ICD-10-CM | POA: Diagnosis not present

## 2018-04-28 HISTORY — DX: Other disorders of bilirubin metabolism: E80.6

## 2018-04-28 LAB — CBC WITH DIFFERENTIAL/PLATELET
Abs Immature Granulocytes: 0.05 10*3/uL (ref 0.00–0.07)
Basophils Absolute: 0.1 10*3/uL (ref 0.0–0.1)
Basophils Relative: 1 %
Eosinophils Absolute: 0.1 10*3/uL (ref 0.0–0.5)
Eosinophils Relative: 1 %
HCT: 31.7 % — ABNORMAL LOW (ref 36.0–46.0)
Hemoglobin: 11.4 g/dL — ABNORMAL LOW (ref 12.0–15.0)
Immature Granulocytes: 1 %
Lymphocytes Relative: 18 %
Lymphs Abs: 1.9 10*3/uL (ref 0.7–4.0)
MCH: 27.7 pg (ref 26.0–34.0)
MCHC: 36 g/dL (ref 30.0–36.0)
MCV: 76.9 fL — ABNORMAL LOW (ref 80.0–100.0)
Monocytes Absolute: 0.8 10*3/uL (ref 0.1–1.0)
Monocytes Relative: 8 %
Neutro Abs: 7.4 10*3/uL (ref 1.7–7.7)
Neutrophils Relative %: 71 %
Platelets: 255 10*3/uL (ref 150–400)
RBC: 4.12 MIL/uL (ref 3.87–5.11)
RDW: 18.4 % — ABNORMAL HIGH (ref 11.5–15.5)
WBC: 10.3 10*3/uL (ref 4.0–10.5)
nRBC: 0 % (ref 0.0–0.2)

## 2018-04-28 LAB — COMPREHENSIVE METABOLIC PANEL
ALT: 54 U/L — ABNORMAL HIGH (ref 0–44)
AST: 54 U/L — ABNORMAL HIGH (ref 15–41)
Albumin: 3 g/dL — ABNORMAL LOW (ref 3.5–5.0)
Alkaline Phosphatase: 259 U/L — ABNORMAL HIGH (ref 38–126)
Anion gap: 13 (ref 5–15)
BUN: 11 mg/dL (ref 8–23)
CO2: 21 mmol/L — ABNORMAL LOW (ref 22–32)
Calcium: 9.8 mg/dL (ref 8.9–10.3)
Chloride: 101 mmol/L (ref 98–111)
Creatinine, Ser: 0.64 mg/dL (ref 0.44–1.00)
GFR calc Af Amer: >60 mL/min (ref >60)
GFR calc non Af Amer: >60 mL/min (ref >60)
Glucose, Bld: 120 mg/dL — ABNORMAL HIGH (ref 70–99)
Potassium: 3 mmol/L — ABNORMAL LOW (ref 3.5–5.1)
Sodium: 135 mmol/L (ref 135–145)
Total Bilirubin: 16.7 mg/dL — ABNORMAL HIGH (ref 0.3–1.2)
Total Protein: 7.9 g/dL (ref 6.5–8.1)

## 2018-04-28 LAB — PROTIME-INR
INR: 0.98
Prothrombin Time: 12.9 seconds (ref 11.4–15.2)

## 2018-04-28 MED ORDER — ASPIRIN EC 81 MG PO TBEC
81.0000 mg | DELAYED_RELEASE_TABLET | Freq: Every day | ORAL | Status: DC
  Administered 2018-04-29: 81 mg via ORAL
  Filled 2018-04-28: qty 1

## 2018-04-28 MED ORDER — POTASSIUM CHLORIDE 20 MEQ PO PACK
40.0000 meq | PACK | ORAL | Status: AC
  Administered 2018-04-28: 40 meq via ORAL
  Filled 2018-04-28: qty 2

## 2018-04-28 MED ORDER — HEPARIN SODIUM (PORCINE) 5000 UNIT/ML IJ SOLN
5000.0000 [IU] | Freq: Three times a day (TID) | INTRAMUSCULAR | Status: AC
  Administered 2018-04-28: 23:00:00 5000 [IU] via SUBCUTANEOUS
  Filled 2018-04-28: qty 1

## 2018-04-28 MED ORDER — LEVOTHYROXINE SODIUM 100 MCG PO TABS
100.0000 ug | ORAL_TABLET | Freq: Every day | ORAL | Status: DC
  Administered 2018-04-29 – 2018-05-02 (×3): 100 ug via ORAL
  Filled 2018-04-28 (×3): qty 1

## 2018-04-28 MED ORDER — VITAMIN D 25 MCG (1000 UNIT) PO TABS
1000.0000 [IU] | ORAL_TABLET | Freq: Every day | ORAL | Status: DC
  Administered 2018-04-30 – 2018-05-02 (×3): 1000 [IU] via ORAL
  Filled 2018-04-28 (×4): qty 1

## 2018-04-28 MED ORDER — POTASSIUM CHLORIDE IN NACL 40-0.9 MEQ/L-% IV SOLN
INTRAVENOUS | Status: AC
  Administered 2018-04-28: 75 mL/h via INTRAVENOUS
  Filled 2018-04-28: qty 1000

## 2018-04-28 MED ORDER — HYDROXYZINE HCL 25 MG PO TABS
12.5000 mg | ORAL_TABLET | Freq: Every evening | ORAL | Status: DC | PRN
  Filled 2018-04-28: qty 1

## 2018-04-28 MED ORDER — SODIUM CHLORIDE 0.9 % IV SOLN: INTRAVENOUS | Status: DC

## 2018-04-28 MED ORDER — DOCUSATE SODIUM 100 MG PO CAPS: 100.0000 mg | ORAL_CAPSULE | Freq: Two times a day (BID) | ORAL | Status: DC | PRN

## 2018-04-28 MED ORDER — LOSARTAN POTASSIUM 25 MG PO TABS
25.0000 mg | ORAL_TABLET | Freq: Every day | ORAL | Status: DC
  Filled 2018-04-28: qty 1

## 2018-04-28 NOTE — H&P (Signed)
[facility] NAME: Lynn Taylor    MR#:  [id number]  DATE OF BIRTH:  06/17/1945  DATE OF ADMISSION:  04/28/2018  PRIMARY CARE PHYSICIAN: Lavera Guise, MD   REQUESTING/REFERRING PHYSICIAN: Clearnce Hasten  CHIEF COMPLAINT:   Chief Complaint  Patient presents with  . Abnormal Lab    HISTORY OF PRESENT ILLNESS: Lynn Taylor  is a 72 y.o. female with a known history of GERD, hypertension, hypothyroidism-was admitted to hospital 2 weeks ago with chest pain and stress test was negative and she was discharged home She was referred to hematology clinic for elevated ferritin. Dr. Tasia Catchings had seen the patient in the clinic and ordered some work-up and call for follow-up yesterday.  She had also ordered repeat labs which showed significant elevation in bilirubin level.  Patient did not had any complaints except for some mild nausea and her urine turning significantly yellowish-brown while her stool turning very light and clay colored. She was noted to have hyperbilirubinemia in the clinic and was ordered to have right upper quadrant ultrasound which showed some dilated intra-and extrahepatic duct so oncologist called and advised to go to emergency room. ER physician spoke to GI and they suggested to admit and possible plan for ERCP tomorrow.  PAST MEDICAL HISTORY:   Past Medical History:  Diagnosis Date  . GERD (gastroesophageal reflux disease)   . Hypertension   . Hypothyroidism     PAST SURGICAL HISTORY:  Past Surgical History:  Procedure Laterality Date  . CHOLECYSTECTOMY    . COLONOSCOPY N/A 12/02/2016   Procedure: COLONOSCOPY;  Surgeon: Lollie Sails, MD;  Location: [facility] ENDOSCOPY;  Service: Endoscopy;  Laterality: N/A;    SOCIAL HISTORY:  Social History   Tobacco Use  . Smoking status: Former Smoker    Last attempt to quit: 04/14/1988    Years since quitting: 30.0  . Smokeless tobacco: Never Used  Substance Use Topics  . Alcohol  use: Yes    Comment: social drinker    FAMILY HISTORY:  Family History  Problem Relation Age of Onset  . Diabetes Mellitus I Other   . Alcoholism Other   . Hypertension Other   . Hyperlipidemia Other   . Coronary artery disease Other   . Stroke Other   . Osteoarthritis Other   . Migraines Other   . Heart Problems Mother   . Stroke Sister   . CAD Brother   . Stroke Brother   . Breast cancer Neg Hx     DRUG ALLERGIES: No Known Allergies  REVIEW OF SYSTEMS:   CONSTITUTIONAL: No fever, have fatigue or weakness.  EYES: No blurred or double vision.  EARS, NOSE, AND THROAT: No tinnitus or ear pain.  RESPIRATORY: No cough, shortness of breath, wheezing or hemoptysis.  CARDIOVASCULAR: No chest pain, orthopnea, edema.  GASTROINTESTINAL: Have nausea, no vomiting, diarrhea or abdominal pain.  GENITOURINARY: No dysuria, hematuria.  ENDOCRINE: No polyuria, nocturia,  HEMATOLOGY: No anemia, easy bruising or bleeding SKIN: No rash or lesion. MUSCULOSKELETAL: No joint pain or arthritis.   NEUROLOGIC: No tingling, numbness, weakness.  PSYCHIATRY: No anxiety or depression.   MEDICATIONS AT HOME:  Prior to Admission medications   Medication Sig Start Date End Date Taking? Authorizing Provider  aspirin EC 81 MG tablet Take 81 mg by mouth daily.   Yes [provider]  cholecalciferol (VITAMIN D3) 25 MCG (1000 UT) tablet Take 1,000 Units by mouth daily.   Yes [provider]  hydrOXYzine (ATARAX/VISTARIL) 25 MG tablet Take 0.5 tablets (12.5 mg total) by mouth at bedtime as needed. Patient taking differently: Take 12.5 mg by mouth at bedtime as needed for anxiety or itching.  04/09/18  Yes Scarboro, Audie Clear, NP  levothyroxine (SYNTHROID, LEVOTHROID) 100 MCG tablet Take 1 tablet (100 mcg total) by mouth daily before breakfast. 11/19/17  Yes Boscia, Heather E, NP  losartan (COZAAR) 25 MG tablet Take 1 tablet (25 mg total) by mouth daily. Patient not taking: Reported on  04/28/2018 11/19/17   Ronnell Freshwater, NP  olmesartan-hydrochlorothiazide (BENICAR HCT) 20-12.5 MG tablet Take 1 tablet by mouth daily. Patient not taking: Reported on 04/28/2018 12/16/17   Ronnell Freshwater, NP      PHYSICAL EXAMINATION:   VITAL SIGNS: Blood pressure 112/84, pulse 78, temperature 98.1 F (36.7 C), temperature source Oral, resp. rate 15, height 5\' 3"  (1.6 m), weight 81.6 kg, SpO2 99 %.  GENERAL:  72 y.o.-year-old patient lying in the bed with no acute distress.  EYES: Pupils equal, round, reactive to light and accommodation.  Positive for scleral icterus. Extraocular muscles intact.  HEENT: Head atraumatic, normocephalic. Oropharynx and nasopharynx clear.  NECK:  Supple, no jugular venous distention. No thyroid enlargement, no tenderness.  LUNGS: Normal breath sounds bilaterally, no wheezing, rales,rhonchi or crepitation. No use of accessory muscles of respiration.  CARDIOVASCULAR: S1, S2 normal. No murmurs, rubs, or gallops.  ABDOMEN: Soft, nontender, nondistended. Bowel sounds present. No organomegaly or mass.  EXTREMITIES: No pedal edema, cyanosis, or clubbing.  NEUROLOGIC: Cranial nerves II through XII are intact. Muscle strength 5/5 in all extremities. Sensation intact. Gait not checked.  PSYCHIATRIC: The patient is alert and oriented x 3.  SKIN: No obvious rash, lesion, or ulcer.   LABORATORY PANEL:   CBC Recent Labs  Lab 04/28/18 1147  WBC 10.3  HGB 11.4*  HCT 31.7*  PLT 255  MCV 76.9*  MCH 27.7  MCHC 36.0  RDW 18.4*  LYMPHSABS 1.9  MONOABS 0.8  EOSABS 0.1  BASOSABS 0.1   ------------------------------------------------------------------------------------------------------------------  Chemistries  Recent Labs  Lab 04/28/18 1147  NA 135  K 3.0*  CL 101  CO2 21*  GLUCOSE 120*  BUN 11  CREATININE 0.64  CALCIUM 9.8  AST 54*  ALT 54*  ALKPHOS 259*  BILITOT 16.7*    ------------------------------------------------------------------------------------------------------------------ estimated creatinine clearance is 64.3 mL/min (by C-G formula based on SCr of 0.64 mg/dL). ------------------------------------------------------------------------------------------------------------------ No results for input(s): TSH, T4TOTAL, T3FREE, THYROIDAB in the last 72 hours.  Invalid input(s): FREET3   Coagulation profile No results for input(s): INR, PROTIME in the last 168 hours. ------------------------------------------------------------------------------------------------------------------- No results for input(s): DDIMER in the last 72 hours. -------------------------------------------------------------------------------------------------------------------  Cardiac Enzymes No results for input(s): CKMB, TROPONINI, MYOGLOBIN in the last 168 hours.  Invalid input(s): CK ------------------------------------------------------------------------------------------------------------------ Invalid input(s): POCBNP  ---------------------------------------------------------------------------------------------------------------  Urinalysis    Component Value Date/Time   COLORURINE AMBER (A) 04/11/2018 2150   APPEARANCEUR HAZY (A) 04/11/2018 2150   LABSPEC 1.012 04/11/2018 2150   PHURINE 5.0 04/11/2018 2150   GLUCOSEU NEGATIVE 04/11/2018 2150   HGBUR SMALL (A) 04/11/2018 2150   BILIRUBINUR NEGATIVE 04/11/2018 2150   BILIRUBINUR large 04/09/2018 1041   KETONESUR 5 (A) 04/11/2018 2150   PROTEINUR NEGATIVE 04/11/2018 2150   UROBILINOGEN 0.2 04/09/2018 1041   NITRITE NEGATIVE 04/11/2018 2150   LEUKOCYTESUR SMALL (A) 04/11/2018 2150     RADIOLOGY: US Abdomen Limited Ruq  Result Date: 04/28/2018 CLINICAL DATA:  72 year old female with elevated ferritin. History of remote  cholecystectomy. EXAM: ULTRASOUND ABDOMEN LIMITED RIGHT UPPER QUADRANT COMPARISON:   None. FINDINGS: Gallbladder: Not visualized compatible with cholecystectomy. Common bile duct: Diameter: 18 mm. Intrahepatic biliary dilatation identified. The LOWER CBD is not well visualized. Liver: Echogenicity is within normal limits. No focal abnormalities are present. Portal vein is patent on color Doppler imaging with normal direction of blood flow towards the liver. IMPRESSION: 1. CBD and intrahepatic biliary dilatation, cause not identified on this study. GI consultation and ERCP/MRCP recommended. 2. Liver otherwise unremarkable. 3. Status post cholecystectomy. Electronically Signed   By: Margarette Canada M.D.   On: 04/28/2018 10:04    EKG: Orders placed or performed during the hospital encounter of 04/11/18  . ED EKG within 10 minutes  . ED EKG within 10 minutes  . EKG 12-Lead  . EKG 12-Lead  . EKG    IMPRESSION AND PLAN:  *Hyperbilirubinemia Possible choledocholithiasis Patient's hemoglobin is stable.  Some microcytosis I will keep n.p.o. from tomorrow morning for possible ERCP. Contacted GI physician and he is aware. Symptomatic treatment for itching and nausea. Check coagulation studies with altered liver function.  *Hypokalemia Replaced with IV fluids.   All the records are reviewed and case discussed with ED provider. Management plans discussed with the patient, family and they are in agreement.  CODE STATUS: Full code. Code Status History    Date Active Date Inactive Code Status Order ID Comments User Context   04/12/2018 0015 04/12/2018 2037 Full Code [id number]  Lance Coon, MD Inpatient     Discussed with patient and daughter in the room.  TOTAL TIME TAKING CARE OF THIS PATIENT: 45 minutes.    Vaughan Basta M.D on 04/28/2018   Between 7am to 6pm - Pager - [phone]  After 6pm go to [email or web address] - password [email or web address] [facility]  Office  [phone]  CC: Primary care physician; Lavera Guise, MD   Note: This dictation was  prepared with Dragon dictation along with smaller phrase technology. Any transcriptional errors that result from this process are unintentional.

## 2018-04-28 NOTE — Consult Note (Signed)
[facility] GI Inpatient Consult Note   Kathline Magic, M.D.  Reason for Consult: Hyperbilirubinemia, Pruritis, abnormal CT scan   Attending Requesting Consult:  Dr. Marthann Schiller  Outpatient Primary Physician: Clayborn Bigness, M.D.  History of Present Illness: Lynn Taylor is a 72 y.o. female with history of hypertension and GERD presenting on request by her hematologist, Dr. Tasia Catchings for marked hyperbilirubinemia of around 16.  The patient has noted about 11 pound weight loss in the last 3 weeks accompanied by symptoms of anorexia and jaundice with intense pruritus affecting sleep quality.  Patient is status post remote cholecystectomy.  Abdominal ultrasound showed intra-and extra hepatic biliary ductal dilatation without any obvious intraductal calculus, stricture etc. noted.  Patient has been referred by her primary care physician to hematology for an elevated serum ferritin of well over 1000, however, review of the iron studies revealed no evidence of iron overload within normal iron saturation of 29%.  Iron levels were also normal. Outpatient work-up thus far revealed a normal viral hepatitis profile as well as a negative serum ANA and is negative antimitochondrial antibody test.  I do not see that a anti-smooth muscle antibody has been ordered, however, to rule out autoimmune hepatitis although that is not considered a cholestatic liver disease as would be suspected in this case.  Past Medical History:  Past Medical History:  Diagnosis Date  . GERD (gastroesophageal reflux disease)   . Hypertension   . Hypothyroidism     Problem List: Patient Active Problem List   Diagnosis Date Noted  . Hyperbilirubinemia 04/28/2018  . Chest pain 04/11/2018  . GERD (gastroesophageal reflux disease) 04/11/2018  . HTN (hypertension), benign 05/20/2017  . Acquired hypothyroidism 05/20/2017  . Vitamin D deficiency, unspecified 05/20/2017    Past Surgical History: Past Surgical History:  Procedure  Laterality Date  . CHOLECYSTECTOMY    . COLONOSCOPY N/A 12/02/2016   Procedure: COLONOSCOPY;  Surgeon: Lollie Sails, MD;  Location: [facility] ENDOSCOPY;  Service: Endoscopy;  Laterality: N/A;    Allergies: No Known Allergies  Home Medications: Medications Prior to Admission  Medication Sig Dispense Refill Last Dose  . aspirin EC 81 MG tablet Take 81 mg by mouth daily.   04/28/2018 at am  . cholecalciferol (VITAMIN D3) 25 MCG (1000 UT) tablet Take 1,000 Units by mouth daily.   Past Week at Unknown time  . hydrOXYzine (ATARAX/VISTARIL) 25 MG tablet Take 0.5 tablets (12.5 mg total) by mouth at bedtime as needed. (Patient taking differently: Take 12.5 mg by mouth at bedtime as needed for anxiety or itching. ) 15 tablet 0  at unknown  . levothyroxine (SYNTHROID, LEVOTHROID) 100 MCG tablet Take 1 tablet (100 mcg total) by mouth daily before breakfast. 90 tablet 4 04/28/2018 at am  . losartan (COZAAR) 25 MG tablet Take 1 tablet (25 mg total) by mouth daily. (Patient not taking: Reported on 04/28/2018) 90 tablet 4 Not Taking at Unknown time  . olmesartan-hydrochlorothiazide (BENICAR HCT) 20-12.5 MG tablet Take 1 tablet by mouth daily. (Patient not taking: Reported on 04/28/2018) 30 tablet 5 Not Taking at Unknown time   Home medication reconciliation was completed with the patient.   Scheduled Inpatient Medications:   . [START ON 04/29/2018] aspirin EC  81 mg Oral Daily  . cholecalciferol  1,000 Units Oral Daily  . heparin  5,000 Units Subcutaneous Q8H  . [START ON 04/29/2018] levothyroxine  100 mcg Oral Q0600  . losartan  25 mg Oral Daily    Continuous Inpatient Infusions:   .  0.9 % NaCl with KCl 40 mEq / L      PRN Inpatient Medications:  docusate sodium, hydrOXYzine  Family History: family history includes Alcoholism in her other; CAD in her brother; Coronary artery disease in her other; Diabetes Mellitus I in her other; Heart Problems in her mother; Hyperlipidemia in her other;  Hypertension in her other; Migraines in her other; Osteoarthritis in her other; Stroke in her brother, other, and sister.   GI Family History: Negative  Social History:   reports that she quit smoking about 30 years ago. She has never used smokeless tobacco. She reports that she drinks alcohol. She reports that she does not use drugs. The patient denies ETOH, tobacco, or drug use.    Review of Systems: Review of Systems - History obtained from the patient General ROS: positive for  - weight loss negative for - fever Psychological ROS: negative ENT ROS: negative Allergy and Immunology ROS: negative for - hives or nasal congestion Hematological and Lymphatic ROS: positive for - weight loss negative for - bleeding problems Respiratory ROS: no cough, shortness of breath, or wheezing Cardiovascular ROS: no chest pain or dyspnea on exertion Genito-Urinary ROS: no dysuria, trouble voiding, or hematuria  Physical Examination: BP (!) 149/64 (BP Location: Right Arm)   Pulse 84   Temp 98.6 F (37 C) (Oral)   Resp 17   Ht 5\' 3"  (1.6 m)   Wt 83.8 kg   SpO2 99%   BMI 32.72 kg/m  Physical Exam  Data: Lab Results  Component Value Date   WBC 10.3 04/28/2018   HGB 11.4 (L) 04/28/2018   HCT 31.7 (L) 04/28/2018   MCV 76.9 (L) 04/28/2018   PLT 255 04/28/2018   Recent Labs  Lab 04/28/18 1147  HGB 11.4*   Lab Results  Component Value Date   NA 135 04/28/2018   K 3.0 (L) 04/28/2018   CL 101 04/28/2018   CO2 21 (L) 04/28/2018   BUN 11 04/28/2018   CREATININE 0.64 04/28/2018   Lab Results  Component Value Date   ALT 54 (H) 04/28/2018   AST 54 (H) 04/28/2018   ALKPHOS 259 (H) 04/28/2018   BILITOT 16.7 (H) 04/28/2018   No results for input(s): APTT, INR, PTT in the last 168 hours. CBC Latest Ref Rng & Units 04/28/2018 04/12/2018 04/11/2018  WBC 4.0 - 10.5 K/uL 10.3 7.1 7.6  Hemoglobin 12.0 - 15.0 g/dL 11.4(L) 12.1 13.2  Hematocrit 36.0 - 46.0 % 31.7(L) 35.0(L) 38.7  Platelets  150 - 400 K/uL 255 240 246    STUDIES: US Abdomen Limited Ruq  Result Date: 04/28/2018 CLINICAL DATA:  72 year old female with elevated ferritin. History of remote cholecystectomy. EXAM: ULTRASOUND ABDOMEN LIMITED RIGHT UPPER QUADRANT COMPARISON:  None. FINDINGS: Gallbladder: Not visualized compatible with cholecystectomy. Common bile duct: Diameter: 18 mm. Intrahepatic biliary dilatation identified. The LOWER CBD is not well visualized. Liver: Echogenicity is within normal limits. No focal abnormalities are present. Portal vein is patent on color Doppler imaging with normal direction of blood flow towards the liver. IMPRESSION: 1. CBD and intrahepatic biliary dilatation, cause not identified on this study. GI consultation and ERCP/MRCP recommended. 2. Liver otherwise unremarkable. 3. Status post cholecystectomy. Electronically Signed   By: Margarette Canada M.D.   On: 04/28/2018 10:04   @IMAGES @  Assessment: 1.  Pruritus-secondary to intra-and possible extrahepatic cholestasis 2.  Hyperbilirubinemia with elevated alkaline phosphatase of 259 and painless jaundice- highly suggest extrahepatic biliary ductal obstruction although direct and indirect fractions of  bilirubinemia have not been reported.  Constellation of symptoms is suspicious for malignancy 3.  Weight loss   Recommendations: 1.  MRI of the abdomen with MRCP 2.  If endoscopically treatable cause of extrahepatic cholestasis can be identified, will advise ERCP. 3.  Fractionate bilirubin 4.  Order anti-smooth muscle antibody. 5.  Further recommendations to follow.  Thank you for the consult. Please call with questions or concerns.  Olean Ree, "Lanny Hurst MD [facility] [place], [place] [address] [phone]  04/28/2018 8:40 PM

## 2018-04-28 NOTE — ED Provider Notes (Signed)
[facility] Emergency Department Provider Note  ____________________________________________   First MD Initiated Contact with Patient 04/28/18 1525     (approximate)  I have reviewed the triage vital signs and the nursing notes.   HISTORY  Chief Complaint Abnormal Lab   HPI Lynn Taylor is a 72 y.o. female with a history of a recently diagnosed high ferritin level and was steadily increasing bilirubin who was presented to the emergency department at the recommendation of her oncologist, Dr. Tasia Catchings.  The patient says that her eyes are becoming more more yellow and she is very itchy.  She denies any pain.  She was also sent to the emergency department for an ultrasound of the right upper quadrant.  Status post cholecystectomy.    Past Medical History:  Diagnosis Date  . GERD (gastroesophageal reflux disease)   . Hypertension   . Hypothyroidism     Patient Active Problem List   Diagnosis Date Noted  . Chest pain 04/11/2018  . GERD (gastroesophageal reflux disease) 04/11/2018  . HTN (hypertension), benign 05/20/2017  . Acquired hypothyroidism 05/20/2017  . Vitamin D deficiency, unspecified 05/20/2017    Past Surgical History:  Procedure Laterality Date  . CHOLECYSTECTOMY    . COLONOSCOPY N/A 12/02/2016   Procedure: COLONOSCOPY;  Surgeon: Lollie Sails, MD;  Location: [facility] ENDOSCOPY;  Service: Endoscopy;  Laterality: N/A;    Prior to Admission medications   Medication Sig Start Date End Date Taking? Authorizing Provider  aspirin EC 81 MG tablet Take 81 mg by mouth daily.    [provider]  Cholecalciferol 1000 units capsule Take 1,000 Units by mouth daily.    [provider]  hydrOXYzine (ATARAX/VISTARIL) 25 MG tablet Take 0.5 tablets (12.5 mg total) by mouth at bedtime as needed. 04/09/18   Kendell Bane, NP  levothyroxine (SYNTHROID, LEVOTHROID) 100 MCG tablet Take 1 tablet (100 mcg total) by mouth daily before breakfast.  11/19/17   Ronnell Freshwater, NP  losartan (COZAAR) 25 MG tablet Take 1 tablet (25 mg total) by mouth daily. 11/19/17   Ronnell Freshwater, NP  olmesartan-hydrochlorothiazide (BENICAR HCT) 20-12.5 MG tablet Take 1 tablet by mouth daily. 12/16/17   Ronnell Freshwater, NP    Allergies Patient has no known allergies.  Family History  Problem Relation Age of Onset  . Diabetes Mellitus I Other   . Alcoholism Other   . Hypertension Other   . Hyperlipidemia Other   . Coronary artery disease Other   . Stroke Other   . Osteoarthritis Other   . Migraines Other   . Heart Problems Mother   . Stroke Sister   . CAD Brother   . Stroke Brother   . Breast cancer Neg Hx     Social History Social History   Tobacco Use  . Smoking status: Former Smoker    Last attempt to quit: 04/14/1988    Years since quitting: 30.0  . Smokeless tobacco: Never Used  Substance Use Topics  . Alcohol use: Yes    Comment: social drinker  . Drug use: No    Review of Systems  Constitutional: No fever/chills Eyes: No visual changes.  Scleral icterus ENT: No sore throat. Cardiovascular: Denies chest pain. Respiratory: Denies shortness of breath. Gastrointestinal: No abdominal pain.  No nausea, no vomiting.  No diarrhea.  No constipation. Genitourinary: Negative for dysuria. Musculoskeletal: Negative for back pain. Skin: No rash. Neurological: Negative for headaches, focal weakness or numbness.   ____________________________________________  PHYSICAL EXAM:  VITAL SIGNS: ED Triage Vitals  Enc Vitals Group     BP 04/28/18 1141 (!) 156/65     Pulse Rate 04/28/18 1141 86     Resp 04/28/18 1141 16     Temp 04/28/18 1141 98.1 F (36.7 C)     Temp Source 04/28/18 1141 Oral     SpO2 04/28/18 1141 99 %     Weight 04/28/18 1142 180 lb (81.6 kg)     Height 04/28/18 1142 5\' 3"  (1.6 m)     Head Circumference --      Peak Flow --      Pain Score 04/28/18 1142 0     Pain Loc --      Pain Edu? --      Excl.  in [place]? --     Constitutional: Alert and oriented. Well appearing and in no acute distress. Eyes: Conjunctivae are normal.  Scleral icterus Head: Atraumatic. Nose: No congestion/rhinnorhea. Mouth/Throat: Mucous membranes are moist.  Neck: No stridor.   Cardiovascular: Normal rate, regular rhythm. Grossly normal heart sounds.  Respiratory: Normal respiratory effort.  No retractions. Lungs CTAB. Gastrointestinal: Soft and nontender. No distention. Musculoskeletal: No lower extremity tenderness nor edema.  No joint effusions. Neurologic:  Normal speech and language. No gross focal neurologic deficits are appreciated. Skin:  Skin is warm, dry and intact. No rash noted. Psychiatric: Mood and affect are normal. Speech and behavior are normal.  ____________________________________________   LABS (all labs ordered are listed, but only abnormal results are displayed)  Labs Reviewed  COMPREHENSIVE METABOLIC PANEL - Abnormal; Notable for the following components:      Result Value   Potassium 3.0 (*)    CO2 21 (*)    Glucose, Bld 120 (*)    Albumin 3.0 (*)    AST 54 (*)    ALT 54 (*)    Alkaline Phosphatase 259 (*)    Total Bilirubin 16.7 (*)    All other components within normal limits  CBC WITH DIFFERENTIAL/PLATELET - Abnormal; Notable for the following components:   Hemoglobin 11.4 (*)    HCT 31.7 (*)    MCV 76.9 (*)    RDW 18.4 (*)    All other components within normal limits   ____________________________________________  EKG   ____________________________________________  RADIOLOGY  Dilated CBD and intrahepatic ducts.  No cause found for the biliary obstruction on ultrasound.  Recommend ERCP versus MRCP. ____________________________________________   PROCEDURES  Procedure(s) performed:   Procedures  Critical Care performed:   ____________________________________________   INITIAL IMPRESSION / ASSESSMENT AND PLAN / ED COURSE  Pertinent labs & imaging  results that were available during my care of the patient were reviewed by me and considered in my medical decision making (see chart for details).  DDX: Biliary obstruction, common bile duct stone, biliary stricture, pancreatic mass, symptomatic hyperbilirubinemia As part of my medical decision making, I reviewed the following data within the [facility] Notes from prior outpatient visits  ----------------------------------------- 4:31 PM on 04/28/2018 -----------------------------------------  Discussed the case with Dr. Alice Reichert of gastroenterology who recommends admission to the hospital.  Admitting service will be the hospitalists.  Discussed with Dr. Verdell Carmine.  Patient aware of diagnosis as well as treatment and willing to comply. ____________________________________________   FINAL CLINICAL IMPRESSION(S) / ED DIAGNOSES  Hyperbilirubinemia.  Jaundice  NEW MEDICATIONS STARTED DURING THIS VISIT:  New Prescriptions   No medications on file     Note:  This document was prepared  using Systems analyst and may include unintentional dictation errors.     Orbie Pyo, MD 04/28/18 [phone]

## 2018-04-28 NOTE — ED Notes (Signed)
Brittany RN, aware of bed assigned  

## 2018-04-28 NOTE — Telephone Encounter (Signed)
Final Result   CLINICAL DATA: 72 year old female with elevated ferritin. History of remote cholecystectomy.  EXAM: ULTRASOUND ABDOMEN LIMITED RIGHT UPPER QUADRANT  COMPARISON: None.  FINDINGS: Gallbladder:  Not visualized compatible with cholecystectomy.  Common bile duct:  Diameter: 18 mm. Intrahepatic biliary dilatation identified. The LOWER CBD is not well visualized.  Liver:  Echogenicity is within normal limits. No focal abnormalities are present. Portal vein is patent on color Doppler imaging with normal direction of blood flow towards the liver.  IMPRESSION: 1. CBD and intrahepatic biliary dilatation, cause not identified on this study. GI consultation and ERCP/MRCP recommended. 2. Liver otherwise unremarkable. 3. Status post cholecystectomy.   Electronically Signed By: Margarette Canada M.D. On: 04/28/2018 10:04

## 2018-04-28 NOTE — ED Notes (Signed)
Attempted to call report. Stated nurse would call back at [phone].

## 2018-04-28 NOTE — ED Triage Notes (Signed)
Pt had [place] this AM of liver ordered by Dr. Tasia Catchings (hematolgoist). Pt states sent over for abnormal result. Also had increased bilirubin.   A&O, ambulatory.

## 2018-04-29 ENCOUNTER — Inpatient Hospital Stay: Payer: Medicare Other

## 2018-04-29 LAB — BASIC METABOLIC PANEL
Anion gap: 5 (ref 5–15)
BUN: 9 mg/dL (ref 8–23)
CO2: 23 mmol/L (ref 22–32)
Calcium: 9.2 mg/dL (ref 8.9–10.3)
Chloride: 108 mmol/L (ref 98–111)
Creatinine, Ser: 0.49 mg/dL (ref 0.44–1.00)
GFR calc Af Amer: >60 mL/min (ref >60)
GFR calc non Af Amer: >60 mL/min (ref >60)
Glucose, Bld: 101 mg/dL — ABNORMAL HIGH (ref 70–99)
Potassium: 3.3 mmol/L — ABNORMAL LOW (ref 3.5–5.1)
Sodium: 136 mmol/L (ref 135–145)

## 2018-04-29 LAB — BILIRUBIN, TOTAL: Total Bilirubin: 13.7 mg/dL — ABNORMAL HIGH (ref 0.3–1.2)

## 2018-04-29 LAB — BILIRUBIN, DIRECT: Bilirubin, Direct: 8.2 mg/dL — ABNORMAL HIGH (ref 0.0–0.2)

## 2018-04-29 LAB — CBC
HCT: 27.1 % — ABNORMAL LOW (ref 36.0–46.0)
Hemoglobin: 9.9 g/dL — ABNORMAL LOW (ref 12.0–15.0)
MCH: 27.7 pg (ref 26.0–34.0)
MCHC: 36.5 g/dL — ABNORMAL HIGH (ref 30.0–36.0)
MCV: 75.7 fL — ABNORMAL LOW (ref 80.0–100.0)
Platelets: 232 10*3/uL (ref 150–400)
RBC: 3.58 MIL/uL — ABNORMAL LOW (ref 3.87–5.11)
RDW: 17.7 % — ABNORMAL HIGH (ref 11.5–15.5)
WBC: 8.2 10*3/uL (ref 4.0–10.5)
nRBC: 0 % (ref 0.0–0.2)

## 2018-04-29 MED ORDER — POTASSIUM CHLORIDE 10 MEQ/100ML IV SOLN: 10.0000 meq | Freq: Once | INTRAVENOUS | Status: DC

## 2018-04-29 MED ORDER — POTASSIUM CHLORIDE 10 MEQ/100ML IV SOLN
10.0000 meq | INTRAVENOUS | Status: AC
  Administered 2018-04-29: 16:00:00 10 meq via INTRAVENOUS
  Filled 2018-04-29: qty 100

## 2018-04-29 MED ORDER — GADOBUTROL 1 MMOL/ML IV SOLN
8.0000 mL | Freq: Once | INTRAVENOUS | Status: AC | PRN
  Administered 2018-04-29: 14:00:00 8 mL via INTRAVENOUS

## 2018-04-29 MED ORDER — POTASSIUM CHLORIDE 20 MEQ PO PACK
20.0000 meq | PACK | ORAL | Status: AC
  Administered 2018-04-29: 20 meq via ORAL
  Filled 2018-04-29: qty 1

## 2018-04-29 MED ORDER — GADOBENATE DIMEGLUMINE 529 MG/ML IV SOLN: 8.0000 mL | Freq: Once | INTRAVENOUS | Status: DC | PRN

## 2018-04-29 NOTE — Plan of Care (Signed)
  Problem: Health Behavior/Discharge Planning: Goal: Ability to manage health-related needs will improve Outcome: Progressing   Problem: Clinical Measurements: Goal: Ability to maintain clinical measurements within normal limits will improve Outcome: Progressing Goal: Will remain free from infection Outcome: Progressing   Problem: Activity: Goal: Risk for activity intolerance will decrease Outcome: Progressing   Problem: Nutrition: Goal: Adequate nutrition will be maintained Outcome: Progressing   Problem: Coping: Goal: Level of anxiety will decrease Outcome: Progressing   Problem: Elimination: Goal: Will not experience complications related to bowel motility Outcome: Progressing   Problem: Pain Managment: Goal: General experience of comfort will improve Outcome: Progressing   Problem: Safety: Goal: Ability to remain free from injury will improve Outcome: Progressing   Problem: Skin Integrity: Goal: Risk for impaired skin integrity will decrease Outcome: Progressing   

## 2018-04-29 NOTE — Progress Notes (Deleted)
[facility] Inpatient Progress Note  Subjective: Patient seen for f/u nausea and vomiting. Feels better. Wants EGD today. Less pain.  Objective: Vital signs in last 24 hours: Temp:  [98.1 F (36.7 C)-98.6 F (37 C)] 98.1 F (36.7 C) (11/21 1508) Pulse Rate:  [84-90] 85 (11/21 1508) Resp:  [14-21] 18 (11/21 1508) BP: (125-149)/(56-71) 135/60 (11/21 1508) SpO2:  [97 %-100 %] 100 % (11/21 1508) Weight:  [83.8 kg] 83.8 kg (11/20 2006) Blood pressure 135/60, pulse 85, temperature 98.1 F (36.7 C), temperature source Oral, resp. rate 18, height 5\' 3"  (1.6 m), weight 83.8 kg, SpO2 100 %.    Intake/Output from previous day: 11/20 0701 - 11/21 0700 In: 475.6 [I.V.:475.6] Out: -   Intake/Output this shift: No intake/output data recorded.   General appearance:  A&O x 3, NAD Resp: CTA Cardio:  RR nl S1, S2 GI:  Soft, mildly tender diffusely, No masses or rebound. BS+ Extremities: No edema.    Lab Results: Results for orders placed or performed during the hospital encounter of 04/28/18 (from the past 24 hour(s))  Protime-INR     Status: None   Collection Time: 04/28/18  9:25 PM  Result Value Ref Range   Prothrombin Time 12.9 11.4 - 15.2 seconds   INR 7.89   Basic metabolic panel     Status: Abnormal   Collection Time: 04/29/18  6:12 AM  Result Value Ref Range   Sodium 136 135 - 145 mmol/L   Potassium 3.3 (L) 3.5 - 5.1 mmol/L   Chloride 108 98 - 111 mmol/L   CO2 23 22 - 32 mmol/L   Glucose, Bld 101 (H) 70 - 99 mg/dL   BUN 9 8 - 23 mg/dL   Creatinine, Ser 0.49 0.44 - 1.00 mg/dL   Calcium 9.2 8.9 - 10.3 mg/dL   GFR calc non Af Amer >60 >60 mL/min   GFR calc Af Amer >60 >60 mL/min   Anion gap 5 5 - 15  CBC     Status: Abnormal   Collection Time: 04/29/18  6:12 AM  Result Value Ref Range   WBC 8.2 4.0 - 10.5 K/uL   RBC 3.58 (L) 3.87 - 5.11 MIL/uL   Hemoglobin 9.9 (L) 12.0 - 15.0 g/dL   HCT 27.1 (L) 36.0 - 46.0 %   MCV 75.7 (L) 80.0 - 100.0 fL   MCH  27.7 26.0 - 34.0 pg   MCHC 36.5 (H) 30.0 - 36.0 g/dL   RDW 17.7 (H) 11.5 - 15.5 %   Platelets 232 150 - 400 K/uL   nRBC 0.0 0.0 - 0.2 %  Bilirubin, direct     Status: Abnormal   Collection Time: 04/29/18  6:12 AM  Result Value Ref Range   Bilirubin, Direct 8.2 (H) 0.0 - 0.2 mg/dL  Bilirubin, total     Status: Abnormal   Collection Time: 04/29/18  6:12 AM  Result Value Ref Range   Total Bilirubin 13.7 (H) 0.3 - 1.2 mg/dL     Recent Labs    04/28/18 1147 04/29/18 0612  WBC 10.3 8.2  HGB 11.4* 9.9*  HCT 31.7* 27.1*  PLT 255 232   BMET Recent Labs    04/28/18 1147 04/29/18 0612  NA 135 136  K 3.0* 3.3*  CL 101 108  CO2 21* 23  GLUCOSE 120* 101*  BUN 11 9  CREATININE 0.64 0.49  CALCIUM 9.8 9.2   LFT Recent Labs    04/28/18 1147 04/29/18 0612  PROT 7.9  --  ALBUMIN 3.0*  --   AST 54*  --   ALT 54*  --   ALKPHOS 259*  --   BILITOT 16.7* 13.7*  BILIDIR  --  8.2*   PT/INR Recent Labs    04/28/18 2125  LABPROT 12.9  INR 0.98   Hepatitis Panel No results for input(s): HEPBSAG, HCVAB, HEPAIGM, HEPBIGM in the last 72 hours. C-Diff No results for input(s): CDIFFTOX in the last 72 hours. No results for input(s): CDIFFPCR in the last 72 hours.   Studies/Results: Mr 3d Recon At Scanner  Result Date: 04/29/2018 CLINICAL DATA:  Elevated bilirubin level. Mild nausea. History gastroesophageal reflux disease. EXAM: MRI ABDOMEN WITHOUT AND WITH CONTRAST (INCLUDING MRCP) TECHNIQUE: Multiplanar multisequence MR imaging of the abdomen was performed both before and after the administration of intravenous contrast. Heavily T2-weighted images of the biliary and pancreatic ducts were obtained, and three-dimensional MRCP images were rendered by post processing. CONTRAST:  8 cc Gadavist COMPARISON:  Ultrasound 04/28/2018 FINDINGS: Portions of exam are minimally motion degraded. Lower chest: Normal heart size without pericardial or pleural effusion. Hepatobiliary: No suspicious  liver lesion. A probable cyst in the lateral segment left liver lobe measures 6 mm. Cholecystectomy. Marked intra and extrahepatic biliary duct dilatation. The common duct measures on the order of 2.3 cm on image 15/6. Is obstructed in the region of the pancreatic head (approximately 3.4 cm cephalad to the ampulla on 31/15). Pancreas: Although there is no border deforming pancreatic lesion identified, there is subtle precontrast T1 hypointensity (image 50/21) and delayed relative hyperenhancement (image 51/30) within the pancreatic head/uncinate process. This measures on the order of 2.2 x 2.5 cm. This area contacts the adjacent superior mesenteric vein, including on image 52/30. No narrowing. No acute pancreatitis. Suspicion of pancreas divisum, with the prominent dorsal duct entering the duodenum on image 38/15 Spleen:  Normal in size, without focal abnormality. Adrenals/Urinary Tract: Normal adrenal glands. Normal kidneys, without hydronephrosis. Stomach/Bowel: Normal stomach and abdominal bowel loops. Vascular/Lymphatic: Aortic atherosclerosis. No arterial encasement. The celiac is mildly ectatic. No abdominal adenopathy. Other:  No ascites.  No evidence of omental or peritoneal disease. Musculoskeletal: Mild right hemidiaphragm elevation. IMPRESSION: 1. Marked biliary duct dilatation, secondary to obstruction in the region of the pancreatic head. 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 Extrahepatic cholangiocarcinoma could look similar but is felt less likely. Consider ERCP with tissue sampling. 2. Minimally motion degraded exam. 3. No abdominal adenopathy, liver metastasis, or gross vascular involvement. If the patient is diagnosed with pancreatic carcinoma and is felt to be a surgical candidate, consider dedicated outpatient pancreatic protocol CT to allow more thorough evaluation of the peripancreatic vasculature. 4. Suspicion of pancreas divisum. 5.  Aortic  Atherosclerosis (ICD10-I70.0). Electronically Signed   By: Abigail Miyamoto M.D.   On: 04/29/2018 14:54   Mr Abdomen Mrcp Moise Boring Contast  Result Date: 04/29/2018 CLINICAL DATA:  Elevated bilirubin level. Mild nausea. History gastroesophageal reflux disease. EXAM: MRI ABDOMEN WITHOUT AND WITH CONTRAST (INCLUDING MRCP) TECHNIQUE: Multiplanar multisequence MR imaging of the abdomen was performed both before and after the administration of intravenous contrast. Heavily T2-weighted images of the biliary and pancreatic ducts were obtained, and three-dimensional MRCP images were rendered by post processing. CONTRAST:  8 cc Gadavist COMPARISON:  Ultrasound 04/28/2018 FINDINGS: Portions of exam are minimally motion degraded. Lower chest: Normal heart size without pericardial or pleural effusion. Hepatobiliary: No suspicious liver lesion. A probable cyst in the lateral segment left liver lobe measures 6 mm.  Cholecystectomy. Marked intra and extrahepatic biliary duct dilatation. The common duct measures on the order of 2.3 cm on image 15/6. Is obstructed in the region of the pancreatic head (approximately 3.4 cm cephalad to the ampulla on 31/15). Pancreas: Although there is no border deforming pancreatic lesion identified, there is subtle precontrast T1 hypointensity (image 50/21) and delayed relative hyperenhancement (image 51/30) within the pancreatic head/uncinate process. This measures on the order of 2.2 x 2.5 cm. This area contacts the adjacent superior mesenteric vein, including on image 52/30. No narrowing. No acute pancreatitis. Suspicion of pancreas divisum, with the prominent dorsal duct entering the duodenum on image 38/15 Spleen:  Normal in size, without focal abnormality. Adrenals/Urinary Tract: Normal adrenal glands. Normal kidneys, without hydronephrosis. Stomach/Bowel: Normal stomach and abdominal bowel loops. Vascular/Lymphatic: Aortic atherosclerosis. No arterial encasement. The celiac is mildly ectatic. No  abdominal adenopathy. Other:  No ascites.  No evidence of omental or peritoneal disease. Musculoskeletal: Mild right hemidiaphragm elevation. IMPRESSION: 1. Marked biliary duct dilatation, secondary to obstruction in the region of the pancreatic head. 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 Extrahepatic cholangiocarcinoma could look similar but is felt less likely. Consider ERCP with tissue sampling. 2. Minimally motion degraded exam. 3. No abdominal adenopathy, liver metastasis, or gross vascular involvement. If the patient is diagnosed with pancreatic carcinoma and is felt to be a surgical candidate, consider dedicated outpatient pancreatic protocol CT to allow more thorough evaluation of the peripancreatic vasculature. 4. Suspicion of pancreas divisum. 5.  Aortic Atherosclerosis (ICD10-I70.0). Electronically Signed   By: Abigail Miyamoto M.D.   On: 04/29/2018 14:54   US Abdomen Limited Ruq  Result Date: 04/28/2018 CLINICAL DATA:  72 year old female with elevated ferritin. History of remote cholecystectomy. EXAM: ULTRASOUND ABDOMEN LIMITED RIGHT UPPER QUADRANT COMPARISON:  None. FINDINGS: Gallbladder: Not visualized compatible with cholecystectomy. Common bile duct: Diameter: 18 mm. Intrahepatic biliary dilatation identified. The LOWER CBD is not well visualized. Liver: Echogenicity is within normal limits. No focal abnormalities are present. Portal vein is patent on color Doppler imaging with normal direction of blood flow towards the liver. IMPRESSION: 1. CBD and intrahepatic biliary dilatation, cause not identified on this study. GI consultation and ERCP/MRCP recommended. 2. Liver otherwise unremarkable. 3. Status post cholecystectomy. Electronically Signed   By: Margarette Canada M.D.   On: 04/28/2018 10:04    Scheduled Inpatient Medications:   . aspirin EC  81 mg Oral Daily  . cholecalciferol  1,000 Units Oral Daily  . levothyroxine  100 mcg Oral Q0600     Continuous Inpatient Infusions:    PRN Inpatient Medications:  docusate sodium, hydrOXYzine    Assessment:  1. Epigastric pain 2. Nausea with vomiting. 3. Hx Peptic ulcer disease.  Plan:  1. Continue current medications. 2. EGD. The patient understands the nature of the planned procedure, indications, risks, alternatives and potential complications including but not limited to bleeding, infection, perforation, damage to internal organs and possible oversedation/side effects from anesthesia. The patient agrees and gives consent to proceed.  Please refer to procedure notes for findings, recommendations and patient disposition/instructions.  Jaydee Ingman K. Alice Reichert, M.D. 04/29/2018, 6:42 PM

## 2018-04-29 NOTE — Progress Notes (Signed)
[place] [facility]                                                                                                                                                                                  Patient Demographics   Lynn Taylor, is a 72 y.o. female, DOB - Jul 30, 1945, HER:[id number]  Admit date - 04/28/2018   Admitting Physician Vaughan Basta, MD  Outpatient Primary MD for the patient is Lavera Guise, MD   LOS - 1  Subjective: Patient had itching last night   Review of Systems:   CONSTITUTIONAL: No documented fever. No fatigue, weakness. No weight gain, no weight loss.  EYES: No blurry or double vision.  ENT: No tinnitus. No postnasal drip. No redness of the oropharynx.  RESPIRATORY: No cough, no wheeze, no hemoptysis. No dyspnea.  CARDIOVASCULAR: No chest pain. No orthopnea. No palpitations. No syncope.  GASTROINTESTINAL: No nausea, no vomiting or diarrhea. No abdominal pain. No melena or hematochezia.  GENITOURINARY: No dysuria or hematuria.  ENDOCRINE: No polyuria or nocturia. No heat or cold intolerance.  HEMATOLOGY: No anemia. No bruising. No bleeding.  INTEGUMENTARY: No rashes. No lesions.  Positive itching MUSCULOSKELETAL: No arthritis. No swelling. No gout.  NEUROLOGIC: No numbness, tingling, or ataxia. No seizure-type activity.  PSYCHIATRIC: No anxiety. No insomnia. No ADD.    Vitals:   Vitals:   04/28/18 1900 04/28/18 1930 04/28/18 2006 04/29/18 0423  BP: (!) 146/71 (!) 142/64 (!) 149/64 (!) 125/56  Pulse: 90 86 84 87  Resp: 16 14 17  (!) 21  Temp:   98.6 F (37 C) 98.6 F (37 C)  TempSrc:   Oral Oral  SpO2: 97% 97% 99% 99%  Weight:   83.8 kg   Height:   5\' 3"  (1.6 m)     Wt Readings from Last 3 Encounters:  04/28/18 83.8 kg  04/20/18 84.4 kg  04/14/18 84.1 kg     Intake/Output Summary (Last 24 hours) at 04/29/2018 1303 Last data filed at 04/29/2018 0330 Gross per 24 hour  Intake 475.6 ml  Output -  Net  475.6 ml    Physical Exam:   GENERAL: Pleasant-appearing in no apparent distress.  HEAD, EYES, EARS, NOSE AND THROAT: Atraumatic, normocephalic. Extraocular muscles are intact. Pupils equal and reactive to light. Sclerae anicteric. No conjunctival injection. No oro-pharyngeal erythema.  NECK: Supple. There is no jugular venous distention. No bruits, no lymphadenopathy, no thyromegaly.  HEART: Regular rate and rhythm,. No murmurs, no rubs, no clicks.  LUNGS: Clear to auscultation bilaterally. No rales or rhonchi. No wheezes.  ABDOMEN: Soft, flat, nontender, nondistended. Has good bowel sounds. No hepatosplenomegaly appreciated.  EXTREMITIES: No  evidence of any cyanosis, clubbing, or peripheral edema.  +2 pedal and radial pulses bilaterally.  NEUROLOGIC: The patient is alert, awake, and oriented x3 with no focal motor or sensory deficits appreciated bilaterally.  SKIN: Moist and warm with no rashes appreciated.  Psych: Not anxious, depressed LN: No inguinal LN enlargement    Antibiotics   Anti-infectives (From admission, onward)   None      Medications   Scheduled Meds: . aspirin EC  81 mg Oral Daily  . cholecalciferol  1,000 Units Oral Daily  . heparin  5,000 Units Subcutaneous Q8H  . levothyroxine  100 mcg Oral Q0600   Continuous Infusions: . potassium chloride     PRN Meds:.docusate sodium, hydrOXYzine   Data Review:   Micro Results No results found for this or any previous visit (from the past 240 hour(s)).  Radiology Reports Dg Chest 2 View  Result Date: 04/11/2018 CLINICAL DATA:  Chest discomfort and shortness of breath for 1 week EXAM: CHEST - 2 VIEW COMPARISON:  None. FINDINGS: Cardiac shadows within normal limits. The lungs are well aerated bilaterally. No focal infiltrate or sizable effusion is seen. No acute bony abnormality is noted. IMPRESSION: No active cardiopulmonary disease. Electronically Signed   By: Inez Catalina M.D.   On: 04/11/2018 17:53   Nm  Myocar Multi W/spect W/wall Motion / Ef  Result Date: 04/12/2018  The study is normal.  This is a low risk study.  The left ventricular ejection fraction is normal (55-65%).  Blood pressure demonstrated a normal response to exercise.  There was no ST segment deviation noted during stress.    US Abdomen Limited Ruq  Result Date: 04/28/2018 CLINICAL DATA:  72 year old female with elevated ferritin. History of remote cholecystectomy. EXAM: ULTRASOUND ABDOMEN LIMITED RIGHT UPPER QUADRANT COMPARISON:  None. FINDINGS: Gallbladder: Not visualized compatible with cholecystectomy. Common bile duct: Diameter: 18 mm. Intrahepatic biliary dilatation identified. The LOWER CBD is not well visualized. Liver: Echogenicity is within normal limits. No focal abnormalities are present. Portal vein is patent on color Doppler imaging with normal direction of blood flow towards the liver. IMPRESSION: 1. CBD and intrahepatic biliary dilatation, cause not identified on this study. GI consultation and ERCP/MRCP recommended. 2. Liver otherwise unremarkable. 3. Status post cholecystectomy. Electronically Signed   By: Margarette Canada M.D.   On: 04/28/2018 10:04     CBC Recent Labs  Lab 04/28/18 1147 04/29/18 0612  WBC 10.3 8.2  HGB 11.4* 9.9*  HCT 31.7* 27.1*  PLT 255 232  MCV 76.9* 75.7*  MCH 27.7 27.7  MCHC 36.0 36.5*  RDW 18.4* 17.7*  LYMPHSABS 1.9  --   MONOABS 0.8  --   EOSABS 0.1  --   BASOSABS 0.1  --     Chemistries  Recent Labs  Lab 04/28/18 1147 04/29/18 0612  NA 135 136  K 3.0* 3.3*  CL 101 108  CO2 21* 23  GLUCOSE 120* 101*  BUN 11 9  CREATININE 0.64 0.49  CALCIUM 9.8 9.2  AST 54*  --   ALT 54*  --   ALKPHOS 259*  --   BILITOT 16.7* 13.7*   ------------------------------------------------------------------------------------------------------------------ estimated creatinine clearance is 65.2 mL/min (by C-G formula based on SCr of 0.49  mg/dL). ------------------------------------------------------------------------------------------------------------------ No results for input(s): HGBA1C in the last 72 hours. ------------------------------------------------------------------------------------------------------------------ No results for input(s): CHOL, HDL, LDLCALC, TRIG, CHOLHDL, LDLDIRECT in the last 72 hours. ------------------------------------------------------------------------------------------------------------------ No results for input(s): TSH, T4TOTAL, T3FREE, THYROIDAB in the last 72 hours.  Invalid input(s):  FREET3 ------------------------------------------------------------------------------------------------------------------ No results for input(s): VITAMINB12, FOLATE, FERRITIN, TIBC, IRON, RETICCTPCT in the last 72 hours.  Coagulation profile Recent Labs  Lab 04/28/18 2125  INR 0.98    No results for input(s): DDIMER in the last 72 hours.  Cardiac Enzymes No results for input(s): CKMB, TROPONINI, MYOGLOBIN in the last 168 hours.  Invalid input(s): CK ------------------------------------------------------------------------------------------------------------------ Invalid input(s): [place]  Patient 72 year old being admitted with hyperbilirubinemia   *Hyperbilirubinemia MRCP pending appreciate GI input further ERCP based on MRCP result  *Hypokalemia We will give further potassium  *Anemia appears to be dilutional we will check anemia panel  *Hypothyroidism continue home medications with Synthroid  *Essential hypertension monitor blood pressure for now     Code Status Orders  (From admission, onward)         Start     Ordered   04/28/18 2027  Full code  Continuous     04/28/18 2026        Code Status History    Date Active Date Inactive Code Status Order ID Comments User Context   04/12/2018 0015 04/12/2018 2037 Full Code [id number]  Lance Coon, MD  Inpatient           Consults  none   DVT Prophylaxis  Lovenox  Lab Results  Component Value Date   PLT 232 04/29/2018     Time Spent in minutes 35-minute  Greater than 50% of time spent in care coordination and counseling patient regarding the condition and plan of care.   Dustin Flock M.D on 04/29/2018 at 1:03 PM  Between 7am to 6pm - Pager - [phone]  After 6pm go to [email or web address] - Proofreader  [employer]   Office  [phone]

## 2018-04-29 NOTE — Progress Notes (Signed)
[facility] Gastroenterology Inpatient Progress Note  Subjective: Patient seen for follow-up of painless jaundice.  MRI completed earlier this afternoon revealed questionable mass near the uncinate process and head of the pancreas.  Possible pancreas disease and was also noted.  Patient continues to have intense pruritus without abdominal pain or fever.  Objective: Vital signs in last 24 hours: Temp:  [98.1 F (36.7 C)-98.6 F (37 C)] 98.1 F (36.7 C) (11/21 1508) Pulse Rate:  [84-90] 85 (11/21 1508) Resp:  [14-21] 18 (11/21 1508) BP: (125-149)/(56-71) 135/60 (11/21 1508) SpO2:  [97 %-100 %] 100 % (11/21 1508) Weight:  [83.8 kg] 83.8 kg (11/20 2006) Blood pressure 135/60, pulse 85, temperature 98.1 F (36.7 C), temperature source Oral, resp. rate 18, height 5\' 3"  (1.6 m), weight 83.8 kg, SpO2 100 %.    Intake/Output from previous day: 11/20 0701 - 11/21 0700 In: 475.6 [I.V.:475.6] Out: -   Intake/Output this shift: No intake/output data recorded.   General appearance: Alert, ill-appearing in no distress Resp: Clear to auscultation Cardio: Regular rate no gallop GI: Soft, mildly distended nontender.  No obvious mass could be palpated.  Bowel sounds positive Extremities: Edema   Lab Results: Results for orders placed or performed during the hospital encounter of 04/28/18 (from the past 24 hour(s))  Protime-INR     Status: None   Collection Time: 04/28/18  9:25 PM  Result Value Ref Range   Prothrombin Time 12.9 11.4 - 15.2 seconds   INR 1.10   Basic metabolic panel     Status: Abnormal   Collection Time: 04/29/18  6:12 AM  Result Value Ref Range   Sodium 136 135 - 145 mmol/L   Potassium 3.3 (L) 3.5 - 5.1 mmol/L   Chloride 108 98 - 111 mmol/L   CO2 23 22 - 32 mmol/L   Glucose, Bld 101 (H) 70 - 99 mg/dL   BUN 9 8 - 23 mg/dL   Creatinine, Ser 0.49 0.44 - 1.00 mg/dL   Calcium 9.2 8.9 - 10.3 mg/dL   GFR calc non Af Amer >60 >60 mL/min   GFR calc Af Amer >60 >60  mL/min   Anion gap 5 5 - 15  CBC     Status: Abnormal   Collection Time: 04/29/18  6:12 AM  Result Value Ref Range   WBC 8.2 4.0 - 10.5 K/uL   RBC 3.58 (L) 3.87 - 5.11 MIL/uL   Hemoglobin 9.9 (L) 12.0 - 15.0 g/dL   HCT 27.1 (L) 36.0 - 46.0 %   MCV 75.7 (L) 80.0 - 100.0 fL   MCH 27.7 26.0 - 34.0 pg   MCHC 36.5 (H) 30.0 - 36.0 g/dL   RDW 17.7 (H) 11.5 - 15.5 %   Platelets 232 150 - 400 K/uL   nRBC 0.0 0.0 - 0.2 %  Bilirubin, direct     Status: Abnormal   Collection Time: 04/29/18  6:12 AM  Result Value Ref Range   Bilirubin, Direct 8.2 (H) 0.0 - 0.2 mg/dL  Bilirubin, total     Status: Abnormal   Collection Time: 04/29/18  6:12 AM  Result Value Ref Range   Total Bilirubin 13.7 (H) 0.3 - 1.2 mg/dL     Recent Labs    04/28/18 1147 04/29/18 0612  WBC 10.3 8.2  HGB 11.4* 9.9*  HCT 31.7* 27.1*  PLT 255 232   BMET Recent Labs    04/28/18 1147 04/29/18 0612  NA 135 136  K 3.0* 3.3*  CL 101 108  CO2 21* 23  GLUCOSE 120* 101*  BUN 11 9  CREATININE 0.64 0.49  CALCIUM 9.8 9.2   LFT Recent Labs    04/28/18 1147 04/29/18 0612  PROT 7.9  --   ALBUMIN 3.0*  --   AST 54*  --   ALT 54*  --   ALKPHOS 259*  --   BILITOT 16.7* 13.7*  BILIDIR  --  8.2*   PT/INR Recent Labs    04/28/18 2125  LABPROT 12.9  INR 0.98   Hepatitis Panel No results for input(s): HEPBSAG, HCVAB, HEPAIGM, HEPBIGM in the last 72 hours. C-Diff No results for input(s): CDIFFTOX in the last 72 hours. No results for input(s): CDIFFPCR in the last 72 hours.   Studies/Results: Mr 3d Recon At Scanner  Result Date: 04/29/2018 CLINICAL DATA:  Elevated bilirubin level. Mild nausea. History gastroesophageal reflux disease. EXAM: MRI ABDOMEN WITHOUT AND WITH CONTRAST (INCLUDING MRCP) TECHNIQUE: Multiplanar multisequence MR imaging of the abdomen was performed both before and after the administration of intravenous contrast. Heavily T2-weighted images of the biliary and pancreatic ducts were obtained,  and three-dimensional MRCP images were rendered by post processing. CONTRAST:  8 cc Gadavist COMPARISON:  Ultrasound 04/28/2018 FINDINGS: Portions of exam are minimally motion degraded. Lower chest: Normal heart size without pericardial or pleural effusion. Hepatobiliary: No suspicious liver lesion. A probable cyst in the lateral segment left liver lobe measures 6 mm. Cholecystectomy. Marked intra and extrahepatic biliary duct dilatation. The common duct measures on the order of 2.3 cm on image 15/6. Is obstructed in the region of the pancreatic head (approximately 3.4 cm cephalad to the ampulla on 31/15). Pancreas: Although there is no border deforming pancreatic lesion identified, there is subtle precontrast T1 hypointensity (image 50/21) and delayed relative hyperenhancement (image 51/30) within the pancreatic head/uncinate process. This measures on the order of 2.2 x 2.5 cm. This area contacts the adjacent superior mesenteric vein, including on image 52/30. No narrowing. No acute pancreatitis. Suspicion of pancreas divisum, with the prominent dorsal duct entering the duodenum on image 38/15 Spleen:  Normal in size, without focal abnormality. Adrenals/Urinary Tract: Normal adrenal glands. Normal kidneys, without hydronephrosis. Stomach/Bowel: Normal stomach and abdominal bowel loops. Vascular/Lymphatic: Aortic atherosclerosis. No arterial encasement. The celiac is mildly ectatic. No abdominal adenopathy. Other:  No ascites.  No evidence of omental or peritoneal disease. Musculoskeletal: Mild right hemidiaphragm elevation. IMPRESSION: 1. Marked biliary duct dilatation, secondary to obstruction in the region of the pancreatic head. 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 Extrahepatic cholangiocarcinoma could look similar but is felt less likely. Consider ERCP with tissue sampling. 2. Minimally motion degraded exam. 3. No abdominal adenopathy, liver metastasis, or  gross vascular involvement. If the patient is diagnosed with pancreatic carcinoma and is felt to be a surgical candidate, consider dedicated outpatient pancreatic protocol CT to allow more thorough evaluation of the peripancreatic vasculature. 4. Suspicion of pancreas divisum. 5.  Aortic Atherosclerosis (ICD10-I70.0). Electronically Signed   By: Abigail Miyamoto M.D.   On: 04/29/2018 14:54   Mr Abdomen Mrcp Moise Boring Contast  Result Date: 04/29/2018 CLINICAL DATA:  Elevated bilirubin level. Mild nausea. History gastroesophageal reflux disease. EXAM: MRI ABDOMEN WITHOUT AND WITH CONTRAST (INCLUDING MRCP) TECHNIQUE: Multiplanar multisequence MR imaging of the abdomen was performed both before and after the administration of intravenous contrast. Heavily T2-weighted images of the biliary and pancreatic ducts were obtained, and three-dimensional MRCP images were rendered by post processing. CONTRAST:  8 cc Gadavist COMPARISON:  Ultrasound 04/28/2018 FINDINGS: Portions of exam are minimally motion degraded. Lower chest: Normal heart size without pericardial or pleural effusion. Hepatobiliary: No suspicious liver lesion. A probable cyst in the lateral segment left liver lobe measures 6 mm. Cholecystectomy. Marked intra and extrahepatic biliary duct dilatation. The common duct measures on the order of 2.3 cm on image 15/6. Is obstructed in the region of the pancreatic head (approximately 3.4 cm cephalad to the ampulla on 31/15). Pancreas: Although there is no border deforming pancreatic lesion identified, there is subtle precontrast T1 hypointensity (image 50/21) and delayed relative hyperenhancement (image 51/30) within the pancreatic head/uncinate process. This measures on the order of 2.2 x 2.5 cm. This area contacts the adjacent superior mesenteric vein, including on image 52/30. No narrowing. No acute pancreatitis. Suspicion of pancreas divisum, with the prominent dorsal duct entering the duodenum on image 38/15 Spleen:   Normal in size, without focal abnormality. Adrenals/Urinary Tract: Normal adrenal glands. Normal kidneys, without hydronephrosis. Stomach/Bowel: Normal stomach and abdominal bowel loops. Vascular/Lymphatic: Aortic atherosclerosis. No arterial encasement. The celiac is mildly ectatic. No abdominal adenopathy. Other:  No ascites.  No evidence of omental or peritoneal disease. Musculoskeletal: Mild right hemidiaphragm elevation. IMPRESSION: 1. Marked biliary duct dilatation, secondary to obstruction in the region of the pancreatic head. 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 Extrahepatic cholangiocarcinoma could look similar but is felt less likely. Consider ERCP with tissue sampling. 2. Minimally motion degraded exam. 3. No abdominal adenopathy, liver metastasis, or gross vascular involvement. If the patient is diagnosed with pancreatic carcinoma and is felt to be a surgical candidate, consider dedicated outpatient pancreatic protocol CT to allow more thorough evaluation of the peripancreatic vasculature. 4. Suspicion of pancreas divisum. 5.  Aortic Atherosclerosis (ICD10-I70.0). Electronically Signed   By: Abigail Miyamoto M.D.   On: 04/29/2018 14:54   US Abdomen Limited Ruq  Result Date: 04/28/2018 CLINICAL DATA:  72 year old female with elevated ferritin. History of remote cholecystectomy. EXAM: ULTRASOUND ABDOMEN LIMITED RIGHT UPPER QUADRANT COMPARISON:  None. FINDINGS: Gallbladder: Not visualized compatible with cholecystectomy. Common bile duct: Diameter: 18 mm. Intrahepatic biliary dilatation identified. The LOWER CBD is not well visualized. Liver: Echogenicity is within normal limits. No focal abnormalities are present. Portal vein is patent on color Doppler imaging with normal direction of blood flow towards the liver. IMPRESSION: 1. CBD and intrahepatic biliary dilatation, cause not identified on this study. GI consultation and ERCP/MRCP recommended. 2.  Liver otherwise unremarkable. 3. Status post cholecystectomy. Electronically Signed   By: Margarette Canada M.D.   On: 04/28/2018 10:04    Scheduled Inpatient Medications:   . aspirin EC  81 mg Oral Daily  . cholecalciferol  1,000 Units Oral Daily  . levothyroxine  100 mcg Oral Q0600    Continuous Inpatient Infusions:    PRN Inpatient Medications:  docusate sodium, hydrOXYzine    Assessment:  1.  Painless, obstructive jaundice- I discussed the case with the patient and told her my concerns about possible malignancy.  Patient understands my concerns and agrees with biliary intervention for possible stent placement.  Plan:  1.  Check serum CEA and CA-19-9 level 2.  Continue to hold all anticoagulation. 3.  ERCP tomorrow with Dr. Lucilla Lame.The patient understands the nature of the planned procedure, indications, risks, alternatives and potential complications including but not limited to bleeding, infection, perforation, damage to internal organs and possible oversedation/side effects from anesthesia. Patient also understands risks of pancreatitis 3-10%. The patient agrees and gives consent to  proceed.  Please refer to procedure notes for findings, recommendations and patient disposition/instructions. Case discussed with biliary interventionalist Dr. Allen Norris who is aware of the details of the patient's clinical status.  Gurney Balthazor K. Alice Reichert, M.D. 04/29/2018, 6:45 PM

## 2018-04-30 ENCOUNTER — Inpatient Hospital Stay: Payer: Medicare Other | Admitting: Certified Registered Nurse Anesthetist

## 2018-04-30 ENCOUNTER — Encounter
Admission: EM | Disposition: A | Discharge status: Home / Self Care | Payer: Self-pay | Source: Home / Self Care | Attending: Internal Medicine

## 2018-04-30 ENCOUNTER — Encounter: Payer: Self-pay | Admitting: Certified Registered Nurse Anesthetist

## 2018-04-30 ENCOUNTER — Inpatient Hospital Stay: Payer: Medicare Other

## 2018-04-30 DIAGNOSIS — D490 Neoplasm of unspecified behavior of digestive system: Secondary | ICD-10-CM

## 2018-04-30 DIAGNOSIS — R17 Unspecified jaundice: Secondary | ICD-10-CM

## 2018-04-30 DIAGNOSIS — K831 Obstruction of bile duct: Secondary | ICD-10-CM

## 2018-04-30 HISTORY — PX: ERCP: SHX5425

## 2018-04-30 LAB — ANTI-SMOOTH MUSCLE ANTIBODY, IGG: F-Actin IgG: 18 Units (ref 0–19)

## 2018-04-30 LAB — CBC
HCT: 27.2 % — ABNORMAL LOW (ref 36.0–46.0)
Hemoglobin: 10 g/dL — ABNORMAL LOW (ref 12.0–15.0)
MCH: 27.9 pg (ref 26.0–34.0)
MCHC: 36.8 g/dL — ABNORMAL HIGH (ref 30.0–36.0)
MCV: 75.8 fL — ABNORMAL LOW (ref 80.0–100.0)
Platelets: 244 10*3/uL (ref 150–400)
RBC: 3.59 MIL/uL — ABNORMAL LOW (ref 3.87–5.11)
RDW: 18.4 % — ABNORMAL HIGH (ref 11.5–15.5)
WBC: 8.3 10*3/uL (ref 4.0–10.5)
nRBC: 0 % (ref 0.0–0.2)

## 2018-04-30 LAB — COMPREHENSIVE METABOLIC PANEL
ALT: 46 U/L — ABNORMAL HIGH (ref 0–44)
AST: 53 U/L — ABNORMAL HIGH (ref 15–41)
Albumin: 2.6 g/dL — ABNORMAL LOW (ref 3.5–5.0)
Alkaline Phosphatase: 221 U/L — ABNORMAL HIGH (ref 38–126)
Anion gap: 6 (ref 5–15)
BUN: 10 mg/dL (ref 8–23)
CO2: 23 mmol/L (ref 22–32)
Calcium: 9.1 mg/dL (ref 8.9–10.3)
Chloride: 104 mmol/L (ref 98–111)
Creatinine, Ser: 0.43 mg/dL — ABNORMAL LOW (ref 0.44–1.00)
GFR calc Af Amer: >60 mL/min (ref >60)
GFR calc non Af Amer: >60 mL/min (ref >60)
Glucose, Bld: 110 mg/dL — ABNORMAL HIGH (ref 70–99)
Potassium: 4 mmol/L (ref 3.5–5.1)
Sodium: 133 mmol/L — ABNORMAL LOW (ref 135–145)
Total Bilirubin: 14.6 mg/dL — ABNORMAL HIGH (ref 0.3–1.2)
Total Protein: 6.7 g/dL (ref 6.5–8.1)

## 2018-04-30 SURGERY — ERCP, WITH INTERVENTION IF INDICATED
Anesthesia: General

## 2018-04-30 MED ORDER — ONDANSETRON HCL 4 MG/2ML IJ SOLN
INTRAMUSCULAR | Status: DC | PRN
  Administered 2018-04-30: 4 mg via INTRAVENOUS

## 2018-04-30 MED ORDER — FENTANYL CITRATE (PF) 100 MCG/2ML IJ SOLN
INTRAMUSCULAR | Status: AC
  Filled 2018-04-30: qty 2

## 2018-04-30 MED ORDER — PROPOFOL 500 MG/50ML IV EMUL
INTRAVENOUS | Status: DC | PRN
  Administered 2018-04-30: 90 ug/kg/min via INTRAVENOUS

## 2018-04-30 MED ORDER — INDOMETHACIN 50 MG RE SUPP
RECTAL | Status: AC
  Filled 2018-04-30: qty 2

## 2018-04-30 MED ORDER — LIDOCAINE HCL (CARDIAC) PF 100 MG/5ML IV SOSY
PREFILLED_SYRINGE | INTRAVENOUS | Status: DC | PRN
  Administered 2018-04-30: 50 mg via INTRAVENOUS

## 2018-04-30 MED ORDER — INDOMETHACIN 50 MG RE SUPP
100.0000 mg | Freq: Once | RECTAL | Status: AC
  Administered 2018-04-30: 100 mg via RECTAL

## 2018-04-30 MED ORDER — SODIUM CHLORIDE 0.9 % IV SOLN
INTRAVENOUS | Status: DC
  Administered 2018-04-30: 09:00:00 via INTRAVENOUS

## 2018-04-30 MED ORDER — CHOLESTYRAMINE 4 G PO PACK
4.0000 g | PACK | Freq: Three times a day (TID) | ORAL | Status: DC
  Administered 2018-04-30 – 2018-05-02 (×5): 4 g via ORAL
  Filled 2018-04-30 (×8): qty 1

## 2018-04-30 MED ORDER — FENTANYL CITRATE (PF) 100 MCG/2ML IJ SOLN
INTRAMUSCULAR | Status: DC | PRN
  Administered 2018-04-30 (×4): 25 ug via INTRAVENOUS

## 2018-04-30 MED ORDER — PROPOFOL 10 MG/ML IV BOLUS
INTRAVENOUS | Status: DC | PRN
  Administered 2018-04-30: 20 mg via INTRAVENOUS
  Administered 2018-04-30: 10 mg via INTRAVENOUS
  Administered 2018-04-30 (×2): 20 mg via INTRAVENOUS
  Administered 2018-04-30: 10 mg via INTRAVENOUS
  Administered 2018-04-30: 20 mg via INTRAVENOUS

## 2018-04-30 NOTE — Anesthesia Post-op Follow-up Note (Signed)
Anesthesia QCDR form completed.        

## 2018-04-30 NOTE — Progress Notes (Signed)
[place] at [facility]                                                                                                                                                                                  Patient Demographics   Lynn Taylor, is a 72 y.o. female, DOB - 09/23/1945, ZWC:[id number]  Admit date - 04/28/2018   Admitting Physician Vaughan Basta, MD  Outpatient Primary MD for the patient is Lavera Guise, MD   LOS - 2  Subjective: Patient underwent ERCP and had a stent placed   Review of Systems:   CONSTITUTIONAL: No documented fever. No fatigue, weakness. No weight gain, no weight loss.  EYES: No blurry or double vision.  ENT: No tinnitus. No postnasal drip. No redness of the oropharynx.  RESPIRATORY: No cough, no wheeze, no hemoptysis. No dyspnea.  CARDIOVASCULAR: No chest pain. No orthopnea. No palpitations. No syncope.  GASTROINTESTINAL: No nausea, no vomiting or diarrhea. No abdominal pain. No melena or hematochezia.  GENITOURINARY: No dysuria or hematuria.  ENDOCRINE: No polyuria or nocturia. No heat or cold intolerance.  HEMATOLOGY: No anemia. No bruising. No bleeding.  INTEGUMENTARY: No rashes. No lesions.  Positive itching MUSCULOSKELETAL: No arthritis. No swelling. No gout.  NEUROLOGIC: No numbness, tingling, or ataxia. No seizure-type activity.  PSYCHIATRIC: No anxiety. No insomnia. No ADD.    Vitals:   Vitals:   04/30/18 1250 04/30/18 1256 04/30/18 1300 04/30/18 1315  BP: 123/60 128/60 128/60 137/65  Pulse: 73 70 70 68  Resp: 11 13 12 20   Temp:    98.1 F (36.7 C)  TempSrc:    Oral  SpO2: 100% 94% 100% 100%  Weight:      Height:        Wt Readings from Last 3 Encounters:  04/28/18 83.8 kg  04/20/18 84.4 kg  04/14/18 84.1 kg     Intake/Output Summary (Last 24 hours) at 04/30/2018 1414 Last data filed at 04/30/2018 1202 Gross per 24 hour  Intake 500 ml  Output -  Net 500 ml    Physical Exam:   GENERAL:  Pleasant-appearing in no apparent distress.  HEAD, EYES, EARS, NOSE AND THROAT: Atraumatic, normocephalic. Extraocular muscles are intact. Pupils equal and reactive to light. Sclerae anicteric. No conjunctival injection. No oro-pharyngeal erythema.  NECK: Supple. There is no jugular venous distention. No bruits, no lymphadenopathy, no thyromegaly.  HEART: Regular rate and rhythm,. No murmurs, no rubs, no clicks.  LUNGS: Clear to auscultation bilaterally. No rales or rhonchi. No wheezes.  ABDOMEN: Soft, flat, nontender, nondistended. Has good bowel sounds. No hepatosplenomegaly appreciated.  EXTREMITIES: No evidence of any cyanosis, clubbing, or peripheral edema.  +  2 pedal and radial pulses bilaterally.  NEUROLOGIC: The patient is alert, awake, and oriented x3 with no focal motor or sensory deficits appreciated bilaterally.  SKIN: Moist and warm with no rashes appreciated.  Psych: Not anxious, depressed LN: No inguinal LN enlargement    Antibiotics   Anti-infectives (From admission, onward)   None      Medications   Scheduled Meds: . cholecalciferol  1,000 Units Oral Daily  . indomethacin      . levothyroxine  100 mcg Oral Q0600   Continuous Infusions:  PRN Meds:.docusate sodium, hydrOXYzine   Data Review:   Micro Results No results found for this or any previous visit (from the past 240 hour(s)).  Radiology Reports Dg Chest 2 View  Result Date: 04/11/2018 CLINICAL DATA:  Chest discomfort and shortness of breath for 1 week EXAM: CHEST - 2 VIEW COMPARISON:  None. FINDINGS: Cardiac shadows within normal limits. The lungs are well aerated bilaterally. No focal infiltrate or sizable effusion is seen. No acute bony abnormality is noted. IMPRESSION: No active cardiopulmonary disease. Electronically Signed   By: Inez Catalina M.D.   On: 04/11/2018 17:53   Mr 3d Recon At Scanner  Result Date: 04/29/2018 CLINICAL DATA:  Elevated bilirubin level. Mild nausea. History gastroesophageal  reflux disease. EXAM: MRI ABDOMEN WITHOUT AND WITH CONTRAST (INCLUDING MRCP) TECHNIQUE: Multiplanar multisequence MR imaging of the abdomen was performed both before and after the administration of intravenous contrast. Heavily T2-weighted images of the biliary and pancreatic ducts were obtained, and three-dimensional MRCP images were rendered by post processing. CONTRAST:  8 cc Gadavist COMPARISON:  Ultrasound 04/28/2018 FINDINGS: Portions of exam are minimally motion degraded. Lower chest: Normal heart size without pericardial or pleural effusion. Hepatobiliary: No suspicious liver lesion. A probable cyst in the lateral segment left liver lobe measures 6 mm. Cholecystectomy. Marked intra and extrahepatic biliary duct dilatation. The common duct measures on the order of 2.3 cm on image 15/6. Is obstructed in the region of the pancreatic head (approximately 3.4 cm cephalad to the ampulla on 31/15). Pancreas: Although there is no border deforming pancreatic lesion identified, there is subtle precontrast T1 hypointensity (image 50/21) and delayed relative hyperenhancement (image 51/30) within the pancreatic head/uncinate process. This measures on the order of 2.2 x 2.5 cm. This area contacts the adjacent superior mesenteric vein, including on image 52/30. No narrowing. No acute pancreatitis. Suspicion of pancreas divisum, with the prominent dorsal duct entering the duodenum on image 38/15 Spleen:  Normal in size, without focal abnormality. Adrenals/Urinary Tract: Normal adrenal glands. Normal kidneys, without hydronephrosis. Stomach/Bowel: Normal stomach and abdominal bowel loops. Vascular/Lymphatic: Aortic atherosclerosis. No arterial encasement. The celiac is mildly ectatic. No abdominal adenopathy. Other:  No ascites.  No evidence of omental or peritoneal disease. Musculoskeletal: Mild right hemidiaphragm elevation. IMPRESSION: 1. Marked biliary duct dilatation, secondary to obstruction in the region of the  pancreatic head. 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 Extrahepatic cholangiocarcinoma could look similar but is felt less likely. Consider ERCP with tissue sampling. 2. Minimally motion degraded exam. 3. No abdominal adenopathy, liver metastasis, or gross vascular involvement. If the patient is diagnosed with pancreatic carcinoma and is felt to be a surgical candidate, consider dedicated outpatient pancreatic protocol CT to allow more thorough evaluation of the peripancreatic vasculature. 4. Suspicion of pancreas divisum. 5.  Aortic Atherosclerosis (ICD10-I70.0). Electronically Signed   By: Abigail Miyamoto M.D.   On: 04/29/2018 14:54   Nm Myocar Multi W/spect W/wall Motion /  Ef  Result Date: 04/12/2018  The study is normal.  This is a low risk study.  The left ventricular ejection fraction is normal (55-65%).  Blood pressure demonstrated a normal response to exercise.  There was no ST segment deviation noted during stress.    Dg C-arm 1-60 Min-no Report  Result Date: 04/30/2018 Fluoroscopy was utilized by the requesting physician.  No radiographic interpretation.   Mr Abdomen Mrcp Moise Boring Contast  Result Date: 04/29/2018 CLINICAL DATA:  Elevated bilirubin level. Mild nausea. History gastroesophageal reflux disease. EXAM: MRI ABDOMEN WITHOUT AND WITH CONTRAST (INCLUDING MRCP) TECHNIQUE: Multiplanar multisequence MR imaging of the abdomen was performed both before and after the administration of intravenous contrast. Heavily T2-weighted images of the biliary and pancreatic ducts were obtained, and three-dimensional MRCP images were rendered by post processing. CONTRAST:  8 cc Gadavist COMPARISON:  Ultrasound 04/28/2018 FINDINGS: Portions of exam are minimally motion degraded. Lower chest: Normal heart size without pericardial or pleural effusion. Hepatobiliary: No suspicious liver lesion. A probable cyst in the lateral segment left liver lobe measures  6 mm. Cholecystectomy. Marked intra and extrahepatic biliary duct dilatation. The common duct measures on the order of 2.3 cm on image 15/6. Is obstructed in the region of the pancreatic head (approximately 3.4 cm cephalad to the ampulla on 31/15). Pancreas: Although there is no border deforming pancreatic lesion identified, there is subtle precontrast T1 hypointensity (image 50/21) and delayed relative hyperenhancement (image 51/30) within the pancreatic head/uncinate process. This measures on the order of 2.2 x 2.5 cm. This area contacts the adjacent superior mesenteric vein, including on image 52/30. No narrowing. No acute pancreatitis. Suspicion of pancreas divisum, with the prominent dorsal duct entering the duodenum on image 38/15 Spleen:  Normal in size, without focal abnormality. Adrenals/Urinary Tract: Normal adrenal glands. Normal kidneys, without hydronephrosis. Stomach/Bowel: Normal stomach and abdominal bowel loops. Vascular/Lymphatic: Aortic atherosclerosis. No arterial encasement. The celiac is mildly ectatic. No abdominal adenopathy. Other:  No ascites.  No evidence of omental or peritoneal disease. Musculoskeletal: Mild right hemidiaphragm elevation. IMPRESSION: 1. Marked biliary duct dilatation, secondary to obstruction in the region of the pancreatic head. 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 Extrahepatic cholangiocarcinoma could look similar but is felt less likely. Consider ERCP with tissue sampling. 2. Minimally motion degraded exam. 3. No abdominal adenopathy, liver metastasis, or gross vascular involvement. If the patient is diagnosed with pancreatic carcinoma and is felt to be a surgical candidate, consider dedicated outpatient pancreatic protocol CT to allow more thorough evaluation of the peripancreatic vasculature. 4. Suspicion of pancreas divisum. 5.  Aortic Atherosclerosis (ICD10-I70.0). Electronically Signed   By: Abigail Miyamoto M.D.    On: 04/29/2018 14:54   US Abdomen Limited Ruq  Result Date: 04/28/2018 CLINICAL DATA:  72 year old female with elevated ferritin. History of remote cholecystectomy. EXAM: ULTRASOUND ABDOMEN LIMITED RIGHT UPPER QUADRANT COMPARISON:  None. FINDINGS: Gallbladder: Not visualized compatible with cholecystectomy. Common bile duct: Diameter: 18 mm. Intrahepatic biliary dilatation identified. The LOWER CBD is not well visualized. Liver: Echogenicity is within normal limits. No focal abnormalities are present. Portal vein is patent on color Doppler imaging with normal direction of blood flow towards the liver. IMPRESSION: 1. CBD and intrahepatic biliary dilatation, cause not identified on this study. GI consultation and ERCP/MRCP recommended. 2. Liver otherwise unremarkable. 3. Status post cholecystectomy. Electronically Signed   By: Margarette Canada M.D.   On: 04/28/2018 10:04     CBC Recent Labs  Lab 04/28/18 1147 04/29/18  3903 04/30/18 0408  WBC 10.3 8.2 8.3  HGB 11.4* 9.9* 10.0*  HCT 31.7* 27.1* 27.2*  PLT 255 232 244  MCV 76.9* 75.7* 75.8*  MCH 27.7 27.7 27.9  MCHC 36.0 36.5* 36.8*  RDW 18.4* 17.7* 18.4*  LYMPHSABS 1.9  --   --   MONOABS 0.8  --   --   EOSABS 0.1  --   --   BASOSABS 0.1  --   --     Chemistries  Recent Labs  Lab 04/28/18 1147 04/29/18 0612 04/30/18 0408  NA 135 136 133*  K 3.0* 3.3* 4.0  CL 101 108 104  CO2 21* 23 23  GLUCOSE 120* 101* 110*  BUN 11 9 10   CREATININE 0.64 0.49 0.43*  CALCIUM 9.8 9.2 9.1  AST 54*  --  53*  ALT 54*  --  46*  ALKPHOS 259*  --  221*  BILITOT 16.7* 13.7* 14.6*   ------------------------------------------------------------------------------------------------------------------ estimated creatinine clearance is 65.2 mL/min (A) (by C-G formula based on SCr of 0.43 mg/dL (L)). ------------------------------------------------------------------------------------------------------------------ No results for input(s): HGBA1C in the last 72  hours. ------------------------------------------------------------------------------------------------------------------ No results for input(s): CHOL, HDL, LDLCALC, TRIG, CHOLHDL, LDLDIRECT in the last 72 hours. ------------------------------------------------------------------------------------------------------------------ No results for input(s): TSH, T4TOTAL, T3FREE, THYROIDAB in the last 72 hours.  Invalid input(s): FREET3 ------------------------------------------------------------------------------------------------------------------ No results for input(s): VITAMINB12, FOLATE, FERRITIN, TIBC, IRON, RETICCTPCT in the last 72 hours.  Coagulation profile Recent Labs  Lab 04/28/18 2125  INR 0.98    No results for input(s): DDIMER in the last 72 hours.  Cardiac Enzymes No results for input(s): CKMB, TROPONINI, MYOGLOBIN in the last 168 hours.  Invalid input(s): CK ------------------------------------------------------------------------------------------------------------------ Invalid input(s): [place]  Patient 72 year old being admitted with hyperbilirubinemia   *Hyperbilirubinemia Status post MRCP and ERCP with a stent placement CA-19-9 levels pending Patient will need a follow-up ERCP in 3 months to remove the stent Further work-up pending results of cytology from ERCP today Start cholstyrimine  *Hypokalemia Replaced  *Anemia appears to be dilutional we will check anemia panel  *Hypothyroidism continue home medications with Synthroid  *Essential hypertension monitor blood pressure for now     Code Status Orders  (From admission, onward)         Start     Ordered   04/28/18 2027  Full code  Continuous     04/28/18 2026        Code Status History    Date Active Date Inactive Code Status Order ID Comments User Context   04/12/2018 0015 04/12/2018 2037 Full Code [id number]  Lance Coon, MD Inpatient           Consults   none   DVT Prophylaxis  Lovenox  Lab Results  Component Value Date   PLT 244 04/30/2018     Time Spent in minutes 35-minute  Greater than 50% of time spent in care coordination and counseling patient regarding the condition and plan of care.   Dustin Flock M.D on 04/30/2018 at 2:14 PM  Between 7am to 6pm - Pager - [phone]  After 6pm go to [email or web address] - Proofreader  [employer]   Office  [phone]

## 2018-04-30 NOTE — Progress Notes (Signed)
Advanced care plan.  Purpose of the Encounter: CODE STATUS  Parties in Attendance: Patient herself  Patient's Decision Capacity: Intact  Subjective/Patient's story:  Patient is 72 year old with history of hypothyroidism, hypertension, GERD presenting with jaundice.  Objective/Medical story Discussed with the patient regarding her desires for cardiac and pulmonary resuscitation   Goals of care determination: Patient would like everything to be done    CODE STATUS: Full code   Time spent discussing advanced care planning: 16 minutes

## 2018-04-30 NOTE — Transfer of Care (Signed)
Immediate Anesthesia Transfer of Care Note  Patient: Lynn Taylor  Procedure(s) Performed: ENDOSCOPIC RETROGRADE CHOLANGIOPANCREATOGRAPHY (ERCP) (N/A )  Patient Location: PACU  Anesthesia Type:MAC  Level of Consciousness: awake, alert  and oriented  Airway & Oxygen Therapy: Patient Spontanous Breathing and Patient connected to nasal cannula oxygen  Post-op Assessment: Report given to RN and Post -op Vital signs reviewed and stable  Post vital signs: Reviewed and stable  Last Vitals:  Vitals Value Taken Time  BP 116/62 04/30/2018 12:30 PM  Temp    Pulse 75 04/30/2018 12:31 PM  Resp 12 04/30/2018 12:31 PM  SpO2 100 % 04/30/2018 12:31 PM  Vitals shown include unvalidated device data.  Last Pain:  Vitals:   04/30/18 1230  TempSrc: Tympanic  PainSc:          Complications: No apparent anesthesia complications

## 2018-04-30 NOTE — Op Note (Addendum)
[facility] Patient Name: Lynn Taylor Procedure Date: 04/30/2018 11:43 AM MRN: [id number] Account #: [id number] Date of Birth: 12-20-1945 Admit Type: Inpatient Age: 72 Room: [facility] ENDO ROOM 4 Gender: Female Note Status: Finalized Procedure:            ERCP Indications:          Jaundice, Tumor of the head of pancreas Providers:            Lucilla Lame MD, MD Referring MD:         Lavera Guise, MD (Referring MD) Medicines:            Propofol per Anesthesia Complications:        No immediate complications. Procedure:            Pre-Anesthesia Assessment:                       - Prior to the procedure, a History and Physical was                        performed, and patient medications and allergies were                        reviewed. The patient's tolerance of previous                        anesthesia was also reviewed. The risks and benefits of                        the procedure and the sedation options and risks were                        discussed with the patient. All questions were                        answered, and informed consent was obtained. Prior                        Anticoagulants: The patient has taken no previous                        anticoagulant or antiplatelet agents. ASA Grade                        Assessment: II - A patient with mild systemic disease.                        After reviewing the risks and benefits, the patient was                        deemed in satisfactory condition to undergo the                        procedure.                       After obtaining informed consent, the scope was passed                        under direct vision. Throughout the procedure, the  patient's blood pressure, pulse, and oxygen saturations                        were monitored continuously. The Duodenoscope was                        introduced through the mouth, and used to inject         contrast into and used to inject contrast into the bile                        duct. The ERCP was accomplished without difficulty. The                        patient tolerated the procedure well. Findings:      A scout film of the abdomen was obtained. Surgical clips, consistent       with previous cholecystectomy, were seen in the area of the cystic duct.       The esophagus was successfully intubated under direct vision. The scope       was advanced to a normal major papilla in the descending duodenum       without detailed examination of the pharynx, larynx and associated       structures, and upper GI tract. The upper GI tract was grossly normal.       The bile duct was deeply cannulated with the short-nosed traction       sphincterotome. Contrast was injected. I personally interpreted the bile       duct images. There was brisk flow of contrast through the ducts. Image       quality was excellent. Contrast extended to the main bile duct. The       lower third of the main bile duct contained a single segmental stenosis       20 mm in length. The common hepatic duct was diffusely dilated. A wire       was passed into the biliary tree. A 6 mm biliary sphincterotomy was made       with a traction (standard) sphincterotome using ERBE electrocautery.       There was no post-sphincterotomy bleeding. One 10 Fr by 5 cm plastic       stent with a single external flap and a single internal flap was placed       4 cm into the common bile duct. Clear fluid flowed through the stent.       The stent was in good position. There was some retrograde flow of       contrast into the pancreatic duct. Cells for cytology were obtained by       brushing in the lower third of the main bile duct. Impression:           - A single segmental biliary stricture was found in the                        lower third of the main bile duct.                       - The common hepatic duct was dilated.                        - A biliary sphincterotomy was performed.                       -  One plastic stent was placed into the common bile                        duct.                       - Cells for cytology obtained in the lower third of the                        main duct. Recommendation:       - Clear liquid diet.                       - Continue present medications.                       - Watch for pancreatitis, bleeding, perforation, and                        cholangitis.                       - Repeat ERCP in 3 months to exchange stent.                       - Await cytology results. Procedure Code(s):    --- Professional ---                       [phone], Endoscopic retrograde cholangiopancreatography                        (ERCP); with placement of endoscopic stent into biliary                        or pancreatic duct, including pre- and post-dilation                        and guide wire passage, when performed, including                        sphincterotomy, when performed, each stent                       [id number], Endoscopic catheterization of the biliary ductal                        system, radiological supervision and interpretation Diagnosis Code(s):    --- Professional ---                       R17, Unspecified jaundice                       D49.0, Neoplasm of unspecified behavior of digestive                        system                       K83.1, Obstruction of bile duct CPT copyright 2018 American Medical Association. All rights reserved. The codes documented in this report are preliminary and upon coder review may  be revised to meet current compliance requirements. Lucilla Lame MD, MD 04/30/2018 12:27:25 PM This report  has been signed electronically. Number of Addenda: 0 Note Initiated On: 04/30/2018 11:43 AM      [facility]

## 2018-04-30 NOTE — Care Management Important Message (Signed)
Copy of Medicare IM left with family in patient's room (out for procedure).

## 2018-04-30 NOTE — Anesthesia Postprocedure Evaluation (Signed)
Anesthesia Post Note  Patient: Lynn Taylor  Procedure(s) Performed: ENDOSCOPIC RETROGRADE CHOLANGIOPANCREATOGRAPHY (ERCP) (N/A )  Patient location during evaluation: Endoscopy Anesthesia Type: General Level of consciousness: awake and alert Pain management: pain level controlled Vital Signs Assessment: post-procedure vital signs reviewed and stable Respiratory status: spontaneous breathing, nonlabored ventilation, respiratory function stable and patient connected to nasal cannula oxygen Cardiovascular status: blood pressure returned to baseline and stable Postop Assessment: no apparent nausea or vomiting Anesthetic complications: no     Last Vitals:  Vitals:   04/30/18 1300 04/30/18 1315  BP: 128/60 137/65  Pulse: 70 68  Resp: 12 20  Temp:  36.7 C  SpO2: 100% 100%    Last Pain:  Vitals:   04/30/18 1315  TempSrc: Oral  PainSc:                  Jerelle Virden S

## 2018-04-30 NOTE — Anesthesia Preprocedure Evaluation (Signed)
Anesthesia Evaluation  Patient identified by MRN, date of birth, ID band Patient awake    Reviewed: Allergy & Precautions, NPO status , Patient's Chart, lab work & pertinent test results  History of Anesthesia Complications Negative for: history of anesthetic complications  Airway Mallampati: II  TM Distance: >3 FB Neck ROM: Full    Dental no notable dental hx.    Pulmonary neg sleep apnea, neg COPD, former smoker,    breath sounds clear to auscultation- rhonchi (-) wheezing      Cardiovascular hypertension, Pt. on medications (-) CAD, (-) Past MI, (-) Cardiac Stents and (-) CABG  Rhythm:Regular Rate:Normal - Systolic murmurs and - Diastolic murmurs    Neuro/Psych negative neurological ROS  negative psych ROS   GI/Hepatic Neg liver ROS, GERD  ,  Endo/Other  neg diabetesHypothyroidism   Renal/GU negative Renal ROS     Musculoskeletal negative musculoskeletal ROS (+)   Abdominal (+) + obese,   Peds  Hematology negative hematology ROS (+)   Anesthesia Other Findings Past Medical History: No date: GERD (gastroesophageal reflux disease) No date: Hypertension No date: Hypothyroidism   Reproductive/Obstetrics                             Anesthesia Physical Anesthesia Plan  ASA: II  Anesthesia Plan: General   Post-op Pain Management:    Induction: Intravenous  PONV Risk Score and Plan: 2 and Propofol infusion  Airway Management Planned: Natural Airway  Additional Equipment:   Intra-op Plan:   Post-operative Plan:   Informed Consent: I have reviewed the patients History and Physical, chart, labs and discussed the procedure including the risks, benefits and alternatives for the proposed anesthesia with the patient or authorized representative who has indicated his/her understanding and acceptance.   Dental advisory given  Plan Discussed with: CRNA and  Anesthesiologist  Anesthesia Plan Comments:         Anesthesia Quick Evaluation

## 2018-05-01 LAB — COMPREHENSIVE METABOLIC PANEL
ALT: 41 U/L (ref 0–44)
AST: 50 U/L — ABNORMAL HIGH (ref 15–41)
Albumin: 2.5 g/dL — ABNORMAL LOW (ref 3.5–5.0)
Alkaline Phosphatase: 223 U/L — ABNORMAL HIGH (ref 38–126)
Anion gap: 6 (ref 5–15)
BUN: 10 mg/dL (ref 8–23)
CO2: 25 mmol/L (ref 22–32)
Calcium: 9.1 mg/dL (ref 8.9–10.3)
Chloride: 102 mmol/L (ref 98–111)
Creatinine, Ser: 0.69 mg/dL (ref 0.44–1.00)
GFR calc Af Amer: >60 mL/min (ref >60)
GFR calc non Af Amer: >60 mL/min (ref >60)
Glucose, Bld: 101 mg/dL — ABNORMAL HIGH (ref 70–99)
Potassium: 3.7 mmol/L (ref 3.5–5.1)
Sodium: 133 mmol/L — ABNORMAL LOW (ref 135–145)
Total Bilirubin: 14.3 mg/dL — ABNORMAL HIGH (ref 0.3–1.2)
Total Protein: 6.7 g/dL (ref 6.5–8.1)

## 2018-05-01 LAB — CBC
HCT: 29.4 % — ABNORMAL LOW (ref 36.0–46.0)
Hemoglobin: 10.7 g/dL — ABNORMAL LOW (ref 12.0–15.0)
MCH: 27.7 pg (ref 26.0–34.0)
MCHC: 36.4 g/dL — ABNORMAL HIGH (ref 30.0–36.0)
MCV: 76.2 fL — ABNORMAL LOW (ref 80.0–100.0)
Platelets: 258 10*3/uL (ref 150–400)
RBC: 3.86 MIL/uL — ABNORMAL LOW (ref 3.87–5.11)
RDW: 19 % — ABNORMAL HIGH (ref 11.5–15.5)
WBC: 10.2 10*3/uL (ref 4.0–10.5)
nRBC: 0 % (ref 0.0–0.2)

## 2018-05-01 LAB — CA 19-9 (SERIAL): CA 19-9: 1173 U/mL — ABNORMAL HIGH (ref 0–35)

## 2018-05-01 LAB — LIPASE, BLOOD: Lipase: 46 U/L (ref 11–51)

## 2018-05-01 LAB — CEA: CEA: 3.9 ng/mL (ref 0.0–4.7)

## 2018-05-01 NOTE — Progress Notes (Signed)
Family Meeting Note  Advance Directive:yes  Today a meeting took place with the Patient.  Patient is able to participate   The following clinical team members were present during this meeting:MD  The following were discussed:Patient's diagnosis: Hyperbilirubinemia, Patient's progosis: Unable to determine and Goals for treatment: Full Code  Additional follow-up to be provided: prn  Time spent during discussion:20 minutes  Gorden Harms, MD

## 2018-05-01 NOTE — Progress Notes (Signed)
Jonathon Bellows , MD [address], [place], [place], [place], [address] [address], [place], [place], [place], [address] Phone: [phone]  Fax: [phone]   KITT MINARDI is being followed for painless jaundice  Subjective: No complaints    Objective: Vital signs in last 24 hours: Vitals:   04/30/18 1300 04/30/18 1315 04/30/18 2007 05/01/18 0352  BP: 128/60 137/65 137/70 116/60  Pulse: 70 68 74 79  Resp: 12 20 14 18   Temp:  98.1 F (36.7 C) 98 F (36.7 C) 97.7 F (36.5 C)  TempSrc:  Oral Oral Oral  SpO2: 100% 100% 95% 100%  Weight:      Height:       Weight change:   Intake/Output Summary (Last 24 hours) at 05/01/2018 1107 Last data filed at 04/30/2018 1859 Gross per 24 hour  Intake 620 ml  Output -  Net 620 ml     Exam: Heart:: Regular rate and rhythm, S1S2 present or without murmur or extra heart sounds Lungs: normal, clear to auscultation and clear to auscultation and percussion Abdomen: soft, nontender, normal bowel sounds   Lab Results: @LABTEST2 @ Micro Results: No results found for this or any previous visit (from the past 240 hour(s)). Studies/Results: Mr 3d Recon At Scanner  Result Date: 04/29/2018 CLINICAL DATA:  Elevated bilirubin level. Mild nausea. History gastroesophageal reflux disease. EXAM: MRI ABDOMEN WITHOUT AND WITH CONTRAST (INCLUDING MRCP) TECHNIQUE: Multiplanar multisequence MR imaging of the abdomen was performed both before and after the administration of intravenous contrast. Heavily T2-weighted images of the biliary and pancreatic ducts were obtained, and three-dimensional MRCP images were rendered by post processing. CONTRAST:  8 cc Gadavist COMPARISON:  Ultrasound 04/28/2018 FINDINGS: Portions of exam are minimally motion degraded. Lower chest: Normal heart size without pericardial or pleural effusion. Hepatobiliary: No suspicious liver lesion. A probable cyst in the lateral segment left liver lobe measures 6 mm.  Cholecystectomy. Marked intra and extrahepatic biliary duct dilatation. The common duct measures on the order of 2.3 cm on image 15/6. Is obstructed in the region of the pancreatic head (approximately 3.4 cm cephalad to the ampulla on 31/15). Pancreas: Although there is no border deforming pancreatic lesion identified, there is subtle precontrast T1 hypointensity (image 50/21) and delayed relative hyperenhancement (image 51/30) within the pancreatic head/uncinate process. This measures on the order of 2.2 x 2.5 cm. This area contacts the adjacent superior mesenteric vein, including on image 52/30. No narrowing. No acute pancreatitis. Suspicion of pancreas divisum, with the prominent dorsal duct entering the duodenum on image 38/15 Spleen:  Normal in size, without focal abnormality. Adrenals/Urinary Tract: Normal adrenal glands. Normal kidneys, without hydronephrosis. Stomach/Bowel: Normal stomach and abdominal bowel loops. Vascular/Lymphatic: Aortic atherosclerosis. No arterial encasement. The celiac is mildly ectatic. No abdominal adenopathy. Other:  No ascites.  No evidence of omental or peritoneal disease. Musculoskeletal: Mild right hemidiaphragm elevation. IMPRESSION: 1. Marked biliary duct dilatation, secondary to obstruction in the region of the pancreatic head. 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 Extrahepatic cholangiocarcinoma could look similar but is felt less likely. Consider ERCP with tissue sampling. 2. Minimally motion degraded exam. 3. No abdominal adenopathy, liver metastasis, or gross vascular involvement. If the patient is diagnosed with pancreatic carcinoma and is felt to be a surgical candidate, consider dedicated outpatient pancreatic protocol CT to allow more thorough evaluation of the peripancreatic vasculature. 4. Suspicion of pancreas divisum. 5.  Aortic Atherosclerosis (ICD10-I70.0). Electronically Signed   By: Adria Devon.D.  On:  04/29/2018 14:54   Dg C-arm 1-60 Min-no Report  Result Date: 04/30/2018 Fluoroscopy was utilized by the requesting physician.  No radiographic interpretation.   Mr Abdomen Mrcp Moise Boring Contast  Result Date: 04/29/2018 CLINICAL DATA:  Elevated bilirubin level. Mild nausea. History gastroesophageal reflux disease. EXAM: MRI ABDOMEN WITHOUT AND WITH CONTRAST (INCLUDING MRCP) TECHNIQUE: Multiplanar multisequence MR imaging of the abdomen was performed both before and after the administration of intravenous contrast. Heavily T2-weighted images of the biliary and pancreatic ducts were obtained, and three-dimensional MRCP images were rendered by post processing. CONTRAST:  8 cc Gadavist COMPARISON:  Ultrasound 04/28/2018 FINDINGS: Portions of exam are minimally motion degraded. Lower chest: Normal heart size without pericardial or pleural effusion. Hepatobiliary: No suspicious liver lesion. A probable cyst in the lateral segment left liver lobe measures 6 mm. Cholecystectomy. Marked intra and extrahepatic biliary duct dilatation. The common duct measures on the order of 2.3 cm on image 15/6. Is obstructed in the region of the pancreatic head (approximately 3.4 cm cephalad to the ampulla on 31/15). Pancreas: Although there is no border deforming pancreatic lesion identified, there is subtle precontrast T1 hypointensity (image 50/21) and delayed relative hyperenhancement (image 51/30) within the pancreatic head/uncinate process. This measures on the order of 2.2 x 2.5 cm. This area contacts the adjacent superior mesenteric vein, including on image 52/30. No narrowing. No acute pancreatitis. Suspicion of pancreas divisum, with the prominent dorsal duct entering the duodenum on image 38/15 Spleen:  Normal in size, without focal abnormality. Adrenals/Urinary Tract: Normal adrenal glands. Normal kidneys, without hydronephrosis. Stomach/Bowel: Normal stomach and abdominal bowel loops. Vascular/Lymphatic: Aortic  atherosclerosis. No arterial encasement. The celiac is mildly ectatic. No abdominal adenopathy. Other:  No ascites.  No evidence of omental or peritoneal disease. Musculoskeletal: Mild right hemidiaphragm elevation. IMPRESSION: 1. Marked biliary duct dilatation, secondary to obstruction in the region of the pancreatic head. 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 Extrahepatic cholangiocarcinoma could look similar but is felt less likely. Consider ERCP with tissue sampling. 2. Minimally motion degraded exam. 3. No abdominal adenopathy, liver metastasis, or gross vascular involvement. If the patient is diagnosed with pancreatic carcinoma and is felt to be a surgical candidate, consider dedicated outpatient pancreatic protocol CT to allow more thorough evaluation of the peripancreatic vasculature. 4. Suspicion of pancreas divisum. 5.  Aortic Atherosclerosis (ICD10-I70.0). Electronically Signed   By: Abigail Miyamoto M.D.   On: 04/29/2018 14:54   Medications: I have reviewed the patient's current medications. Scheduled Meds: . cholecalciferol  1,000 Units Oral Daily  . cholestyramine  4 g Oral TID  . levothyroxine  100 mcg Oral Q0600   Continuous Infusions: PRN Meds:.docusate sodium, hydrOXYzine   Assessment: Principal Problem:   Hyperbilirubinemia Active Problems:   Jaundice   Pancreas neoplasm   Biliary tract obstruction  Lessie Dings 72 y.o. female with s/p ERCP for jaundice and tumor in head of pancreas :  CBD stent placed . T bilirubin still elevated.   Plan: 1. Home tomorrow once we see bilirubin trending down.    LOS: 3 days   Jonathon Bellows, MD 05/01/2018, 11:07 AM

## 2018-05-01 NOTE — Progress Notes (Signed)
[place] at [facility]                                                                                                                                                                                  Patient Demographics   Lynn Taylor, is a 72 y.o. female, DOB - 1945-10-15, WVP:[id number]  Admit date - 04/28/2018   Admitting Physician Vaughan Basta, MD  Outpatient Primary MD for the patient is Lavera Guise, MD   LOS - 3  Subjective: Patient without complaint, husband at the bedside   Review of Systems:   CONSTITUTIONAL: No documented fever. No fatigue, weakness. No weight gain, no weight loss.  EYES: No blurry or double vision.  ENT: No tinnitus. No postnasal drip. No redness of the oropharynx.  RESPIRATORY: No cough, no wheeze, no hemoptysis. No dyspnea.  CARDIOVASCULAR: No chest pain. No orthopnea. No palpitations. No syncope.  GASTROINTESTINAL: No nausea, no vomiting or diarrhea. No abdominal pain. No melena or hematochezia.  GENITOURINARY: No dysuria or hematuria.  ENDOCRINE: No polyuria or nocturia. No heat or cold intolerance.  HEMATOLOGY: No anemia. No bruising. No bleeding.  INTEGUMENTARY: No rashes. No lesions.  Positive itching MUSCULOSKELETAL: No arthritis. No swelling. No gout.  NEUROLOGIC: No numbness, tingling, or ataxia. No seizure-type activity.  PSYCHIATRIC: No anxiety. No insomnia. No ADD.    Vitals:   Vitals:   04/30/18 1300 04/30/18 1315 04/30/18 2007 05/01/18 0352  BP: 128/60 137/65 137/70 116/60  Pulse: 70 68 74 79  Resp: 12 20 14 18   Temp:  98.1 F (36.7 C) 98 F (36.7 C) 97.7 F (36.5 C)  TempSrc:  Oral Oral Oral  SpO2: 100% 100% 95% 100%  Weight:      Height:        Wt Readings from Last 3 Encounters:  04/28/18 83.8 kg  04/20/18 84.4 kg  04/14/18 84.1 kg     Intake/Output Summary (Last 24 hours) at 05/01/2018 1145 Last data filed at 04/30/2018 1859 Gross per 24 hour  Intake 620 ml  Output -   Net 620 ml    Physical Exam:   GENERAL: Pleasant-appearing in no apparent distress.  HEAD, EYES, EARS, NOSE AND THROAT: Atraumatic, normocephalic. Extraocular muscles are intact. Pupils equal and reactive to light. Sclerae anicteric. No conjunctival injection. No oro-pharyngeal erythema.  NECK: Supple. There is no jugular venous distention. No bruits, no lymphadenopathy, no thyromegaly.  HEART: Regular rate and rhythm,. No murmurs, no rubs, no clicks.  LUNGS: Clear to auscultation bilaterally. No rales or rhonchi. No wheezes.  ABDOMEN: Soft, flat, nontender, nondistended. Has good bowel sounds. No hepatosplenomegaly appreciated.  EXTREMITIES: No evidence of any cyanosis,  clubbing, or peripheral edema.  +2 pedal and radial pulses bilaterally.  NEUROLOGIC: The patient is alert, awake, and oriented x3 with no focal motor or sensory deficits appreciated bilaterally.  SKIN: Moist and warm with no rashes appreciated.  Psych: Not anxious, depressed LN: No inguinal LN enlargement    Antibiotics   Anti-infectives (From admission, onward)   None      Medications   Scheduled Meds: . cholecalciferol  1,000 Units Oral Daily  . cholestyramine  4 g Oral TID  . levothyroxine  100 mcg Oral Q0600   Continuous Infusions:  PRN Meds:.docusate sodium, hydrOXYzine   Data Review:   Micro Results No results found for this or any previous visit (from the past 240 hour(s)).  Radiology Reports Dg Chest 2 View  Result Date: 04/11/2018 CLINICAL DATA:  Chest discomfort and shortness of breath for 1 week EXAM: CHEST - 2 VIEW COMPARISON:  None. FINDINGS: Cardiac shadows within normal limits. The lungs are well aerated bilaterally. No focal infiltrate or sizable effusion is seen. No acute bony abnormality is noted. IMPRESSION: No active cardiopulmonary disease. Electronically Signed   By: Inez Catalina M.D.   On: 04/11/2018 17:53   Mr 3d Recon At Scanner  Result Date: 04/29/2018 CLINICAL DATA:   Elevated bilirubin level. Mild nausea. History gastroesophageal reflux disease. EXAM: MRI ABDOMEN WITHOUT AND WITH CONTRAST (INCLUDING MRCP) TECHNIQUE: Multiplanar multisequence MR imaging of the abdomen was performed both before and after the administration of intravenous contrast. Heavily T2-weighted images of the biliary and pancreatic ducts were obtained, and three-dimensional MRCP images were rendered by post processing. CONTRAST:  8 cc Gadavist COMPARISON:  Ultrasound 04/28/2018 FINDINGS: Portions of exam are minimally motion degraded. Lower chest: Normal heart size without pericardial or pleural effusion. Hepatobiliary: No suspicious liver lesion. A probable cyst in the lateral segment left liver lobe measures 6 mm. Cholecystectomy. Marked intra and extrahepatic biliary duct dilatation. The common duct measures on the order of 2.3 cm on image 15/6. Is obstructed in the region of the pancreatic head (approximately 3.4 cm cephalad to the ampulla on 31/15). Pancreas: Although there is no border deforming pancreatic lesion identified, there is subtle precontrast T1 hypointensity (image 50/21) and delayed relative hyperenhancement (image 51/30) within the pancreatic head/uncinate process. This measures on the order of 2.2 x 2.5 cm. This area contacts the adjacent superior mesenteric vein, including on image 52/30. No narrowing. No acute pancreatitis. Suspicion of pancreas divisum, with the prominent dorsal duct entering the duodenum on image 38/15 Spleen:  Normal in size, without focal abnormality. Adrenals/Urinary Tract: Normal adrenal glands. Normal kidneys, without hydronephrosis. Stomach/Bowel: Normal stomach and abdominal bowel loops. Vascular/Lymphatic: Aortic atherosclerosis. No arterial encasement. The celiac is mildly ectatic. No abdominal adenopathy. Other:  No ascites.  No evidence of omental or peritoneal disease. Musculoskeletal: Mild right hemidiaphragm elevation. IMPRESSION: 1. Marked biliary duct  dilatation, secondary to obstruction in the region of the pancreatic head. 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 Extrahepatic cholangiocarcinoma could look similar but is felt less likely. Consider ERCP with tissue sampling. 2. Minimally motion degraded exam. 3. No abdominal adenopathy, liver metastasis, or gross vascular involvement. If the patient is diagnosed with pancreatic carcinoma and is felt to be a surgical candidate, consider dedicated outpatient pancreatic protocol CT to allow more thorough evaluation of the peripancreatic vasculature. 4. Suspicion of pancreas divisum. 5.  Aortic Atherosclerosis (ICD10-I70.0). Electronically Signed   By: Abigail Miyamoto M.D.   On: 04/29/2018 14:54   Nm  Myocar Multi W/spect W/wall Motion / Ef  Result Date: 04/12/2018  The study is normal.  This is a low risk study.  The left ventricular ejection fraction is normal (55-65%).  Blood pressure demonstrated a normal response to exercise.  There was no ST segment deviation noted during stress.    Dg C-arm 1-60 Min-no Report  Result Date: 04/30/2018 Fluoroscopy was utilized by the requesting physician.  No radiographic interpretation.   Mr Abdomen Mrcp Moise Boring Contast  Result Date: 04/29/2018 CLINICAL DATA:  Elevated bilirubin level. Mild nausea. History gastroesophageal reflux disease. EXAM: MRI ABDOMEN WITHOUT AND WITH CONTRAST (INCLUDING MRCP) TECHNIQUE: Multiplanar multisequence MR imaging of the abdomen was performed both before and after the administration of intravenous contrast. Heavily T2-weighted images of the biliary and pancreatic ducts were obtained, and three-dimensional MRCP images were rendered by post processing. CONTRAST:  8 cc Gadavist COMPARISON:  Ultrasound 04/28/2018 FINDINGS: Portions of exam are minimally motion degraded. Lower chest: Normal heart size without pericardial or pleural effusion. Hepatobiliary: No suspicious liver lesion. A  probable cyst in the lateral segment left liver lobe measures 6 mm. Cholecystectomy. Marked intra and extrahepatic biliary duct dilatation. The common duct measures on the order of 2.3 cm on image 15/6. Is obstructed in the region of the pancreatic head (approximately 3.4 cm cephalad to the ampulla on 31/15). Pancreas: Although there is no border deforming pancreatic lesion identified, there is subtle precontrast T1 hypointensity (image 50/21) and delayed relative hyperenhancement (image 51/30) within the pancreatic head/uncinate process. This measures on the order of 2.2 x 2.5 cm. This area contacts the adjacent superior mesenteric vein, including on image 52/30. No narrowing. No acute pancreatitis. Suspicion of pancreas divisum, with the prominent dorsal duct entering the duodenum on image 38/15 Spleen:  Normal in size, without focal abnormality. Adrenals/Urinary Tract: Normal adrenal glands. Normal kidneys, without hydronephrosis. Stomach/Bowel: Normal stomach and abdominal bowel loops. Vascular/Lymphatic: Aortic atherosclerosis. No arterial encasement. The celiac is mildly ectatic. No abdominal adenopathy. Other:  No ascites.  No evidence of omental or peritoneal disease. Musculoskeletal: Mild right hemidiaphragm elevation. IMPRESSION: 1. Marked biliary duct dilatation, secondary to obstruction in the region of the pancreatic head. 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 Extrahepatic cholangiocarcinoma could look similar but is felt less likely. Consider ERCP with tissue sampling. 2. Minimally motion degraded exam. 3. No abdominal adenopathy, liver metastasis, or gross vascular involvement. If the patient is diagnosed with pancreatic carcinoma and is felt to be a surgical candidate, consider dedicated outpatient pancreatic protocol CT to allow more thorough evaluation of the peripancreatic vasculature. 4. Suspicion of pancreas divisum. 5.  Aortic Atherosclerosis  (ICD10-I70.0). Electronically Signed   By: Abigail Miyamoto M.D.   On: 04/29/2018 14:54   US Abdomen Limited Ruq  Result Date: 04/28/2018 CLINICAL DATA:  72 year old female with elevated ferritin. History of remote cholecystectomy. EXAM: ULTRASOUND ABDOMEN LIMITED RIGHT UPPER QUADRANT COMPARISON:  None. FINDINGS: Gallbladder: Not visualized compatible with cholecystectomy. Common bile duct: Diameter: 18 mm. Intrahepatic biliary dilatation identified. The LOWER CBD is not well visualized. Liver: Echogenicity is within normal limits. No focal abnormalities are present. Portal vein is patent on color Doppler imaging with normal direction of blood flow towards the liver. IMPRESSION: 1. CBD and intrahepatic biliary dilatation, cause not identified on this study. GI consultation and ERCP/MRCP recommended. 2. Liver otherwise unremarkable. 3. Status post cholecystectomy. Electronically Signed   By: Margarette Canada M.D.   On: 04/28/2018 10:04     CBC Recent  Labs  Lab 04/28/18 1147 04/29/18 0612 04/30/18 0408 05/01/18 0404  WBC 10.3 8.2 8.3 10.2  HGB 11.4* 9.9* 10.0* 10.7*  HCT 31.7* 27.1* 27.2* 29.4*  PLT 255 232 244 258  MCV 76.9* 75.7* 75.8* 76.2*  MCH 27.7 27.7 27.9 27.7  MCHC 36.0 36.5* 36.8* 36.4*  RDW 18.4* 17.7* 18.4* 19.0*  LYMPHSABS 1.9  --   --   --   MONOABS 0.8  --   --   --   EOSABS 0.1  --   --   --   BASOSABS 0.1  --   --   --     Chemistries  Recent Labs  Lab 04/28/18 1147 04/29/18 0612 04/30/18 0408 05/01/18 0404  NA 135 136 133* 133*  K 3.0* 3.3* 4.0 3.7  CL 101 108 104 102  CO2 21* 23 23 25   GLUCOSE 120* 101* 110* 101*  BUN 11 9 10 10   CREATININE 0.64 0.49 0.43* 0.69  CALCIUM 9.8 9.2 9.1 9.1  AST 54*  --  53* 50*  ALT 54*  --  46* 41  ALKPHOS 259*  --  221* 223*  BILITOT 16.7* 13.7* 14.6* 14.3*   ------------------------------------------------------------------------------------------------------------------ estimated creatinine clearance is 65.2 mL/min (by C-G  formula based on SCr of 0.69 mg/dL). ------------------------------------------------------------------------------------------------------------------ No results for input(s): HGBA1C in the last 72 hours. ------------------------------------------------------------------------------------------------------------------ No results for input(s): CHOL, HDL, LDLCALC, TRIG, CHOLHDL, LDLDIRECT in the last 72 hours. ------------------------------------------------------------------------------------------------------------------ No results for input(s): TSH, T4TOTAL, T3FREE, THYROIDAB in the last 72 hours.  Invalid input(s): FREET3 ------------------------------------------------------------------------------------------------------------------ No results for input(s): VITAMINB12, FOLATE, FERRITIN, TIBC, IRON, RETICCTPCT in the last 72 hours.  Coagulation profile Recent Labs  Lab 04/28/18 2125  INR 0.98    No results for input(s): DDIMER in the last 72 hours.  Cardiac Enzymes No results for input(s): CKMB, TROPONINI, MYOGLOBIN in the last 168 hours.  Invalid input(s): CK ------------------------------------------------------------------------------------------------------------------ Invalid input(s): [place]  Patient 72 year old being admitted with hyperbilirubinemia   *Hyperbilirubinemia Improving Status post MRCP and ERCP with a stent placement April 30, 2018 CA-19-9 levels pending Patient will need a follow-up ERCP in 3 months to remove the stent - Dr. Allen Norris Follow-up on cytology  Continue cholstyrimine  *Hypokalemia Repleted  *Anemia  Stable   *Hypothyroidism continue synthroid  *Essential hypertension  Tinea home regiment     Code Status Orders  (From admission, onward)         Start     Ordered   04/28/18 2027  Full code  Continuous     04/28/18 2026        Code Status History    Date Active Date Inactive Code Status Order ID  Comments User Context   04/12/2018 0015 04/12/2018 2037 Full Code [id number]  Lance Coon, MD Inpatient           Consults  none   DVT Prophylaxis  Lovenox  Lab Results  Component Value Date   PLT 258 05/01/2018     Time Spent in minutes 35-minute  Greater than 50% of time spent in care coordination and counseling patient regarding the condition and plan of care.   Avel Peace Cristina Mattern M.D on 05/01/2018 at 11:45 AM  Between 7am to 6pm - Pager - [phone]  After 6pm go to [email or web address] - Proofreader  [employer]   Office  [phone]

## 2018-05-02 LAB — COMPREHENSIVE METABOLIC PANEL
ALT: 40 U/L (ref 0–44)
AST: 52 U/L — ABNORMAL HIGH (ref 15–41)
Albumin: 2.5 g/dL — ABNORMAL LOW (ref 3.5–5.0)
Alkaline Phosphatase: 218 U/L — ABNORMAL HIGH (ref 38–126)
Anion gap: 8 (ref 5–15)
BUN: 8 mg/dL (ref 8–23)
CO2: 24 mmol/L (ref 22–32)
Calcium: 9.4 mg/dL (ref 8.9–10.3)
Chloride: 101 mmol/L (ref 98–111)
Creatinine, Ser: 0.74 mg/dL (ref 0.44–1.00)
GFR calc Af Amer: >60 mL/min (ref >60)
GFR calc non Af Amer: >60 mL/min (ref >60)
Glucose, Bld: 101 mg/dL — ABNORMAL HIGH (ref 70–99)
Potassium: 3.6 mmol/L (ref 3.5–5.1)
Sodium: 133 mmol/L — ABNORMAL LOW (ref 135–145)
Total Bilirubin: 13.8 mg/dL — ABNORMAL HIGH (ref 0.3–1.2)
Total Protein: 6.7 g/dL (ref 6.5–8.1)

## 2018-05-02 MED ORDER — CHOLESTYRAMINE 4 G PO PACK: 4.0000 g | PACK | Freq: Three times a day (TID) | ORAL | 0 refills | Status: DC

## 2018-05-02 NOTE — Discharge Summary (Addendum)
Lynn Taylor NAME: Lynn Taylor    MR#:  [id number]  DATE OF BIRTH:  04-26-1946  DATE OF ADMISSION:  04/28/2018 ADMITTING PHYSICIAN: Vaughan Basta, MD  DATE OF DISCHARGE: No discharge date for patient encounter.  PRIMARY CARE PHYSICIAN: Lavera Guise, MD    ADMISSION DIAGNOSIS:  Hyperbilirubinemia [E80.6] Jaundice [R17]  DISCHARGE DIAGNOSIS:  Principal Problem:   Hyperbilirubinemia Active Problems:   Jaundice   Pancreas neoplasm   Biliary tract obstruction   SECONDARY DIAGNOSIS:   Past Medical History:  Diagnosis Date  . GERD (gastroesophageal reflux disease)   . Hypertension   . Hypothyroidism     HOSPITAL COURSE:  Patient 72 year old being admitted with hyperbilirubinemia  *Hyperbilirubinemia Improving Status post MRCP and ERCP with a stent placement April 30, 2018 by gastroenterology [id number] over 1100 -we will make arrangements for follow-up with oncology status post discharge for further work-up/management Patient will need a follow-up ERCP in 3 months to remove the stent - Dr. Allen Norris, and discussion with Dr. Anna/gastroenterology-we will follow-up in 1 week status post discharge to ensure improvement of hyperbilirubinemia Cytology pending at discharge  continue cholstyrimine  *Hypokalemia Repleted  *Anemia  Stable   *Hypothyroidism continue synthroid  *Essential hypertension  Tinea home regiment   DISCHARGE CONDITIONS:   stable  CONSULTS OBTAINED:  Treatment Team:  Efrain Sella, MD  DRUG ALLERGIES:  No Known Allergies  DISCHARGE MEDICATIONS:   Allergies as of 05/02/2018   No Known Allergies     Medication List    STOP taking these medications   losartan 25 MG tablet Commonly known as:  COZAAR     TAKE these medications   aspirin EC 81 MG tablet Take 81 mg by mouth daily.   cholecalciferol 25 MCG (1000 UT) tablet Commonly known as:  VITAMIN D3 Take  1,000 Units by mouth daily.   cholestyramine 4 g packet Commonly known as:  QUESTRAN Take 1 packet (4 g total) by mouth 3 (three) times daily.   hydrOXYzine 25 MG tablet Commonly known as:  ATARAX/VISTARIL Take 0.5 tablets (12.5 mg total) by mouth at bedtime as needed. What changed:  reasons to take this   levothyroxine 100 MCG tablet Commonly known as:  SYNTHROID, LEVOTHROID Take 1 tablet (100 mcg total) by mouth daily before breakfast.   olmesartan-hydrochlorothiazide 20-12.5 MG tablet Commonly known as:  BENICAR HCT Take 1 tablet by mouth daily.        DISCHARGE INSTRUCTIONS:    If you experience worsening of your admission symptoms, develop shortness of breath, life threatening emergency, suicidal or homicidal thoughts you must seek medical attention immediately by calling 911 or calling your MD immediately  if symptoms less severe.  You Must read complete instructions/literature along with all the possible adverse reactions/side effects for all the Medicines you take and that have been prescribed to you. Take any new Medicines after you have completely understood and accept all the possible adverse reactions/side effects.   Please note  You were cared for by a hospitalist during your hospital stay. If you have any questions about your discharge medications or the care you received while you were in the hospital after you are discharged, you can call the unit and asked to speak with the hospitalist on call if the hospitalist that took care of you is not available. Once you are discharged, your primary care physician will handle any further medical issues. Please note that NO REFILLS for any  discharge medications will be authorized once you are discharged, as it is imperative that you return to your primary care physician (or establish a relationship with a primary care physician if you do not have one) for your aftercare needs so that they can reassess your need for medications  and monitor your lab values.    Today   CHIEF COMPLAINT:   Chief Complaint  Patient presents with  . Abnormal Lab    HISTORY OF PRESENT ILLNESS:  72 y.o. female with a known history of GERD, hypertension, hypothyroidism-was admitted to hospital 2 weeks ago with chest pain and stress test was negative and she was discharged home She was referred to hematology clinic for elevated ferritin. Dr. Tasia Catchings had seen the patient in the clinic and ordered some work-up and call for follow-up yesterday.  She had also ordered repeat labs which showed significant elevation in bilirubin level.  Patient did not had any complaints except for some mild nausea and her urine turning significantly yellowish-brown while her stool turning very light and clay colored. She was noted to have hyperbilirubinemia in the clinic and was ordered to have right upper quadrant ultrasound which showed some dilated intra-and extrahepatic duct so oncologist called and advised to go to emergency room. ER physician spoke to GI and they suggested to admit and possible plan for ERCP tomorrow.  VITAL SIGNS:  Blood pressure 121/63, pulse 86, temperature 98.9 F (37.2 C), temperature source Oral, resp. rate 18, height 5\' 3"  (1.6 m), weight 83.8 kg, SpO2 96 %.  I/O:    Intake/Output Summary (Last 24 hours) at 05/02/2018 0957 Last data filed at 05/01/2018 1836 Gross per 24 hour  Intake 730 ml  Output -  Net 730 ml    PHYSICAL EXAMINATION:  GENERAL:  72 y.o.-year-old patient lying in the bed with no acute distress.  EYES: Pupils equal, round, reactive to light and accommodation. No scleral icterus. Extraocular muscles intact.  HEENT: Head atraumatic, normocephalic. Oropharynx and nasopharynx clear.  NECK:  Supple, no jugular venous distention. No thyroid enlargement, no tenderness.  LUNGS: Normal breath sounds bilaterally, no wheezing, rales,rhonchi or crepitation. No use of accessory muscles of respiration.  CARDIOVASCULAR:  S1, S2 normal. No murmurs, rubs, or gallops.  ABDOMEN: Soft, non-tender, non-distended. Bowel sounds present. No organomegaly or mass.  EXTREMITIES: No pedal edema, cyanosis, or clubbing.  NEUROLOGIC: Cranial nerves II through XII are intact. Muscle strength 5/5 in all extremities. Sensation intact. Gait not checked.  PSYCHIATRIC: The patient is alert and oriented x 3.  SKIN: No obvious rash, lesion, or ulcer.   DATA REVIEW:   CBC Recent Labs  Lab 05/01/18 0404  WBC 10.2  HGB 10.7*  HCT 29.4*  PLT 258    Chemistries  Recent Labs  Lab 05/02/18 0751  NA 133*  K 3.6  CL 101  CO2 24  GLUCOSE 101*  BUN 8  CREATININE 0.74  CALCIUM 9.4  AST 52*  ALT 40  ALKPHOS 218*  BILITOT 13.8*    Cardiac Enzymes No results for input(s): TROPONINI in the last 168 hours.  Microbiology Results  Results for orders placed or performed during the hospital encounter of 04/11/18  Urine culture     Status: Abnormal   Collection Time: 04/11/18  9:50 PM  Result Value Ref Range Status   Specimen Description   Final    URINE, RANDOM Performed at [facility], [address] [place], [place] [address]    Special Requests   Final  NONE Performed at [facility], [address] [place], [place] [address]    Culture MULTIPLE SPECIES PRESENT, SUGGEST RECOLLECTION (A)  Final   Report Status 04/13/2018 FINAL  Final    RADIOLOGY:  Dg C-arm 1-60 Min-no Report  Result Date: 04/30/2018 Fluoroscopy was utilized by the requesting physician.  No radiographic interpretation.    EKG:   Orders placed or performed during the hospital encounter of 04/11/18  . ED EKG within 10 minutes  . ED EKG within 10 minutes  . EKG 12-Lead  . EKG 12-Lead  . EKG      Management plans discussed with the patient, family and they are in agreement.  CODE STATUS:     Code Status Orders  (From admission, onward)         Start     Ordered   04/28/18 2027  Full code  Continuous      04/28/18 2026        Code Status History    Date Active Date Inactive Code Status Order ID Comments User Context   04/12/2018 0015 04/12/2018 2037 Full Code [id number]  Lance Coon, MD Inpatient      TOTAL TIME TAKING CARE OF THIS PATIENT: 40 minutes.    Avel Peace Dona Walby M.D on 05/02/2018 at 9:57 AM  Between 7am to 6pm - Pager - [phone]  After 6pm go to [email or web address] - password [email or web address] [facility]  Office  [phone]  CC: Primary care physician; Lavera Guise, MD   Note: This dictation was prepared with Dragon dictation along with smaller phrase technology. Any transcriptional errors that result from this process are unintentional.

## 2018-05-03 ENCOUNTER — Encounter: Payer: Self-pay | Admitting: Gastroenterology

## 2018-05-03 ENCOUNTER — Other Ambulatory Visit: Payer: Self-pay | Admitting: Pathology

## 2018-05-03 ENCOUNTER — Other Ambulatory Visit: Payer: Self-pay | Admitting: Oncology

## 2018-05-03 LAB — CYTOLOGY - NON PAP

## 2018-05-04 ENCOUNTER — Encounter: Payer: Self-pay | Admitting: Oncology

## 2018-05-04 ENCOUNTER — Telehealth: Payer: Self-pay

## 2018-05-04 ENCOUNTER — Other Ambulatory Visit: Payer: Self-pay | Admitting: *Deleted

## 2018-05-04 ENCOUNTER — Other Ambulatory Visit: Payer: Self-pay

## 2018-05-04 ENCOUNTER — Inpatient Hospital Stay: Payer: Medicare Other

## 2018-05-04 ENCOUNTER — Inpatient Hospital Stay (HOSPITAL_BASED_OUTPATIENT_CLINIC_OR_DEPARTMENT_OTHER): Payer: Medicare Other | Admitting: Oncology

## 2018-05-04 ENCOUNTER — Ambulatory Visit (INDEPENDENT_AMBULATORY_CARE_PROVIDER_SITE_OTHER): Payer: Medicare Other | Admitting: Adult Health

## 2018-05-04 ENCOUNTER — Encounter: Payer: Self-pay | Admitting: Adult Health

## 2018-05-04 VITALS — BP 148/74 | HR 87 | Resp 16 | Ht 63.0 in | Wt 188.0 lb

## 2018-05-04 VITALS — BP 139/77 | HR 75 | Temp 97.9°F | Resp 18

## 2018-05-04 VITALS — BP 151/78 | HR 69 | Temp 96.5°F | Resp 16 | Wt 187.2 lb

## 2018-05-04 DIAGNOSIS — R7989 Other specified abnormal findings of blood chemistry: Secondary | ICD-10-CM

## 2018-05-04 DIAGNOSIS — E871 Hypo-osmolality and hyponatremia: Secondary | ICD-10-CM

## 2018-05-04 DIAGNOSIS — R17 Unspecified jaundice: Secondary | ICD-10-CM

## 2018-05-04 DIAGNOSIS — K219 Gastro-esophageal reflux disease without esophagitis: Secondary | ICD-10-CM | POA: Diagnosis not present

## 2018-05-04 DIAGNOSIS — I10 Essential (primary) hypertension: Secondary | ICD-10-CM

## 2018-05-04 DIAGNOSIS — K831 Obstruction of bile duct: Secondary | ICD-10-CM

## 2018-05-04 DIAGNOSIS — Z79899 Other long term (current) drug therapy: Secondary | ICD-10-CM

## 2018-05-04 DIAGNOSIS — Z87891 Personal history of nicotine dependence: Secondary | ICD-10-CM

## 2018-05-04 DIAGNOSIS — C250 Malignant neoplasm of head of pancreas: Secondary | ICD-10-CM

## 2018-05-04 DIAGNOSIS — R110 Nausea: Secondary | ICD-10-CM | POA: Diagnosis not present

## 2018-05-04 DIAGNOSIS — Z7982 Long term (current) use of aspirin: Secondary | ICD-10-CM | POA: Diagnosis not present

## 2018-05-04 DIAGNOSIS — R5383 Other fatigue: Secondary | ICD-10-CM

## 2018-05-04 DIAGNOSIS — R531 Weakness: Secondary | ICD-10-CM | POA: Diagnosis not present

## 2018-05-04 DIAGNOSIS — Z87440 Personal history of urinary (tract) infections: Secondary | ICD-10-CM

## 2018-05-04 DIAGNOSIS — D490 Neoplasm of unspecified behavior of digestive system: Secondary | ICD-10-CM

## 2018-05-04 DIAGNOSIS — E039 Hypothyroidism, unspecified: Secondary | ICD-10-CM | POA: Diagnosis not present

## 2018-05-04 LAB — COMPREHENSIVE METABOLIC PANEL
ALT: 41 U/L (ref 0–44)
AST: 60 U/L — ABNORMAL HIGH (ref 15–41)
Albumin: 2.8 g/dL — ABNORMAL LOW (ref 3.5–5.0)
Alkaline Phosphatase: 236 U/L — ABNORMAL HIGH (ref 38–126)
Anion gap: 7 (ref 5–15)
BUN: 9 mg/dL (ref 8–23)
CO2: 24 mmol/L (ref 22–32)
Calcium: 9.7 mg/dL (ref 8.9–10.3)
Chloride: 97 mmol/L — ABNORMAL LOW (ref 98–111)
Creatinine, Ser: 0.74 mg/dL (ref 0.44–1.00)
GFR calc Af Amer: >60 mL/min (ref >60)
GFR calc non Af Amer: >60 mL/min (ref >60)
Glucose, Bld: 114 mg/dL — ABNORMAL HIGH (ref 70–99)
Potassium: 4 mmol/L (ref 3.5–5.1)
Sodium: 128 mmol/L — ABNORMAL LOW (ref 135–145)
Total Bilirubin: 11.9 mg/dL — ABNORMAL HIGH (ref 0.3–1.2)
Total Protein: 7.5 g/dL (ref 6.5–8.1)

## 2018-05-04 LAB — IRON AND TIBC
Iron: 77 ug/dL (ref 28–170)
Saturation Ratios: 24 % (ref 10.4–31.8)
TIBC: 320 ug/dL (ref 250–450)
UIBC: 243 ug/dL

## 2018-05-04 LAB — FERRITIN: Ferritin: 496 ng/mL — ABNORMAL HIGH (ref 11–307)

## 2018-05-04 MED ORDER — ONDANSETRON HCL 4 MG PO TABS: 4.0000 mg | ORAL_TABLET | Freq: Four times a day (QID) | ORAL | 3 refills | Status: DC | PRN

## 2018-05-04 MED ORDER — SODIUM CHLORIDE 0.9 % IV SOLN
Freq: Once | INTRAVENOUS | Status: AC
  Administered 2018-05-04: 14:00:00 via INTRAVENOUS
  Filled 2018-05-04: qty 250

## 2018-05-04 NOTE — Progress Notes (Signed)
Patient here today for follow up.  Patient c/o fatigue, indigestion and a certain smell that she smells that makes her nauseated.

## 2018-05-04 NOTE — Telephone Encounter (Signed)
Called patitent to notifiy her that sodium is low and Dr. Tasia Catchings would be sending Sodium tablets to her pharmacy.  She states that she has not been eating much due to nausea.  I also notified her that Dr. Allen Norris would give her a call regarding another ERCP. Patient acknowledged understanding.

## 2018-05-04 NOTE — Patient Instructions (Signed)
Jaundice, Adult Jaundice is when the skin, the whites of the eyes, and mucous membranes turn a yellowish color. It is caused by a high level of bilirubin in the blood. Bilirubin is made in the body when red blood cells break down normally. Having jaundice may mean that your liver or your body's bile system is not working right. Follow these instructions at home:  Drink enough fluid to keep your pee (urine) clear or pale yellow.  Do not drink alcohol.  Take medicines only as told by your doctor.  Keep all follow-up visits as told by your doctor. This is important.  You can use skin lotion to help with itching. Contact a doctor if:  You have a fever. Get help right away if:  Your symptoms suddenly get worse.  You have symptoms for more than 72 hours.  Your pain gets worse.  You keep throwing up (vomiting).  You become weak or confused.  You get a very bad headache.  You lose too much body fluid (dehydration). Signs that have you lost too much body fluid include: ? A very dry mouth. ? A fast, weak pulse. ? Fast breathing. ? Blue lips. This information is not intended to replace advice given to you by your health care provider. Make sure you discuss any questions you have with your health care provider. Document Released: 06/28/2010 Document Revised: 11/01/2015 Document Reviewed: 05/22/2014 Elsevier Interactive Patient Education  [employer].

## 2018-05-04 NOTE — Progress Notes (Signed)
[facility] [address] [place], [place] [address]  Internal MEDICINE  Office Visit Note  Patient Name: Lynn Taylor  [id number]  [id number]  Date of Service: 05/05/2018     Chief Complaint  Patient presents with  . abnormal labs    Hospital follow up , stent in bowel duct      HPI Pt is here for recent hospital follow up.  She reports Stent bile duct, blockage in pancreas.  The blockage in her pancreas was biopsied and found to be cancerous.  Patient is in good spirits and is thankful that this cancer appears to have been found early.  She will follow-up with oncology at [facility] for treatment plan.  Current Medication: Outpatient Encounter Medications as of 05/04/2018  Medication Sig Note  . aspirin EC 81 MG tablet Take 81 mg by mouth daily.   . cholecalciferol (VITAMIN D3) 25 MCG (1000 UT) tablet Take 1,000 Units by mouth daily.   . cholestyramine (QUESTRAN) 4 g packet Take 1 packet (4 g total) by mouth 3 (three) times daily.   . hydrOXYzine (ATARAX/VISTARIL) 25 MG tablet Take 0.5 tablets (12.5 mg total) by mouth at bedtime as needed. (Patient taking differently: Take 12.5 mg by mouth at bedtime as needed for anxiety or itching. )   . levothyroxine (SYNTHROID, LEVOTHROID) 100 MCG tablet Take 1 tablet (100 mcg total) by mouth daily before breakfast.   . olmesartan-hydrochlorothiazide (BENICAR HCT) 20-12.5 MG tablet Take 1 tablet by mouth daily. 04/28/2018: Patient stated that her doctor took her off of this medication about a week ago.   No facility-administered encounter medications on file as of 05/04/2018.     Surgical History: Past Surgical History:  Procedure Laterality Date  . CHOLECYSTECTOMY    . COLONOSCOPY N/A 12/02/2016   Procedure: COLONOSCOPY;  Surgeon: Lollie Sails, MD;  Location[facility] ENDOSCOPY;  Service: Endoscopy;  Laterality: N/A;  . ERCP N/A 04/30/2018   Procedure: ENDOSCOPIC RETROGRADE CHOLANGIOPANCREATOGRAPHY (ERCP);  Surgeon: Lucilla Lame, MD;  Location: [facility] ENDOSCOPY;  Service: Endoscopy;  Laterality: N/A;    Medical History: Past Medical History:  Diagnosis Date  . Cancer ([place])   . GERD (gastroesophageal reflux disease)   . Hypertension   . Hypothyroidism     Family History: Family History  Problem Relation Age of Onset  . Diabetes Mellitus I Other   . Alcoholism Other   . Hypertension Other   . Hyperlipidemia Other   . Coronary artery disease Other   . Stroke Other   . Osteoarthritis Other   . Migraines Other   . Heart Problems Mother   . Stroke Sister   . CAD Brother   . Stroke Brother   . Breast cancer Neg Hx     Social History   Socioeconomic History  . Marital status: Married    Spouse name: Not on file  . Number of children: Not on file  . Years of education: Not on file  . Highest education level: Not on file  Occupational History  . Not on file  Social Needs  . Financial resource strain: Not on file  . Food insecurity:    Worry: Not on file    Inability: Not on file  . Transportation needs:    Medical: Not on file    Non-medical: Not on file  Tobacco Use  . Smoking status: Former Smoker    Last attempt to quit: 04/14/1988    Years since quitting: 30.0  . Smokeless tobacco:  Never Used  Substance and Sexual Activity  . Alcohol use: Yes    Comment: social drinker  . Drug use: No  . Sexual activity: Not on file  Lifestyle  . Physical activity:    Days per week: Not on file    Minutes per session: Not on file  . Stress: Not on file  Relationships  . Social connections:    Talks on phone: Not on file    Gets together: Not on file    Attends religious service: Not on file    Active member of club or organization: Not on file    Attends meetings of clubs or organizations: Not on file    Relationship status: Not on file  . Intimate partner violence:    Fear of current or ex partner: Not on file    Emotionally abused: Not on file    Physically abused: Not on file     Forced sexual activity: Not on file  Other Topics Concern  . Not on file  Social History Narrative  . Not on file      Review of Systems  Vital Signs: BP (!) 148/74 (BP Location: Left Arm, Patient Position: Sitting, Cuff Size: Normal)   Pulse 87   Resp 16   Ht 5\' 3"  (1.6 m)   Wt 188 lb (85.3 kg)   SpO2 98%   BMI 33.30 kg/m    Physical Exam  Constitutional: She is oriented to person, place, and time. She appears well-developed and well-nourished. No distress.  HENT:  Head: Normocephalic and atraumatic.  Mouth/Throat: Oropharynx is clear and moist. No oropharyngeal exudate.  Eyes: Pupils are equal, round, and reactive to light. EOM are normal.  Neck: Normal range of motion. Neck supple. No JVD present. No tracheal deviation present. No thyromegaly present.  Cardiovascular: Normal rate, regular rhythm and normal heart sounds. Exam reveals no gallop and no friction rub.  No murmur heard. Pulmonary/Chest: Effort normal and breath sounds normal. No respiratory distress. She has no wheezes. She has no rales. She exhibits no tenderness.  Abdominal: Soft. There is no tenderness. There is no guarding.  Musculoskeletal: Normal range of motion.  Lymphadenopathy:    She has no cervical adenopathy.  Neurological: She is alert and oriented to person, place, and time. No cranial nerve deficit.  Skin: Skin is warm and dry. She is not diaphoretic.  Psychiatric: She has a normal mood and affect. Her behavior is normal. Judgment and thought content normal.  Nursing note and vitals reviewed.  Assessment/Plan: 1. Jaundice Jaundice appears to be improving in the sclera.  2. HTN (hypertension), benign Patient's blood pressure slightly elevated at today's visit.  Patient reports she has had some stress since her diagnosis.  We will continue to follow blood pressure in the future.  3. Pancreas neoplasm Neoplasm of the pancreas was found and staging is not yet available.  Patient has yet to  follow-up with oncology but should be in the coming weeks.  4. Biliary tract obstruction Patient is biliary tract was stented while she was in the hospital.  She reports her side pain and abdominal pain have much improved.  General Counseling: [place] verbalizes understanding of the findings of todays visit and agrees with plan of treatment. I have discussed any further diagnostic evaluation that may be needed or ordered today. We also reviewed her medications today. she has been encouraged to call the office with any questions or concerns that should arise related to todays visit.  No orders of the defined types were placed in this encounter.     I have reviewed all medical records from hospital follow up including radiology reports and consults from other physicians. Appropriate follow up diagnostics will be scheduled as needed. Patient/ Family understands the plan of treatment.  Time spent 30 minutes.   Orson Gear AGNP-C Internal Medicine

## 2018-05-04 NOTE — Progress Notes (Signed)
Hematology/Oncology follow-up note [facility] Telephone:([phone] [phone]   Patient Care Team: Lavera Guise, MD as PCP - General (Internal Medicine)  REFERRING PROVIDER: Evie Lacks CHIEF COMPLAINTS/REASON FOR VISIT:  Follow-up for management of pancreatic cancer.  HISTORY OF PRESENTING ILLNESS:  Lynn Taylor is a  72 y.o.  female with PMH listed below who was referred to me for evaluation of evaluation of elevated ferritin. Patient recently had lab work-up done on 04/09/2018 which showed ferritin level 1877, iron 97, TIBC 340, iron saturation 29, 04/11/2018 CBC showed hemoglobin 13.2, MCV 81, WBC 7.6, platelet counts 246,000. Patient was referred to hematology for further evaluation of elevated ferritin.  Patient reports recent hospitalization from 04/11/2018 to 04/12/2018.  She presented emergency room with complaints of chest pain.  Ruled out ACS.  Patient also had a recent UTI infection and has been on Macrobid. Denies any family history of hereditary hemochromatosis. Denies shortness of breath Denies joint pain. Reports feeling well today.  INTERVAL HISTORY Lynn Taylor is a 72 y.o. female who has above history reviewed by me today presents for follow up visit for management of newly diagnosed pancreatic cancer. Patient was last seen by me on 04/14/2018 for elevated ferritin. Ultrasound of liver was obtained which showed CBD and intrahepatic biliary dilatation Patient wa she is s advised to go to emergency room for further evaluation. Also had hyperbilirubinemia.  Patient was complaining about being jaundiced, pruritus all over. MR abdomen MRCP with and without contrast showed market biliary duct dilatation, secondary to obstruction in the region of pancreatic head.  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 No abdominal adenopathy, liver metastasis or cross vascular involvement.  patient  was evaluated by gastroenterology and status post ERCP with stenting. CA 19.9 1173 CEA 3.9 Denies Patient was accompanied by daughter today.  She reports that jaundice and pruritus have improved.  Any abdominal pain.  Reports feeling very fatigued. Decreased appetite.  Feels mild nausea when she eats.  Reports not drinking much fluid. Review of Systems  Constitutional: Positive for fatigue. Negative for appetite change, chills and fever.  HENT:   Negative for hearing loss and voice change.        Jaudice  Eyes: Negative for eye problems.  Respiratory: Negative for chest tightness and cough.   Cardiovascular: Negative for chest pain.  Gastrointestinal: Positive for nausea. Negative for abdominal distention, abdominal pain and blood in stool.  Endocrine: Negative for hot flashes.  Genitourinary: Negative for difficulty urinating and frequency.   Musculoskeletal: Negative for arthralgias.  Skin: Negative for itching and rash.  Neurological: Negative for extremity weakness.  Hematological: Negative for adenopathy.  Psychiatric/Behavioral: Negative for confusion.     MEDICAL HISTORY:  Past Medical History:  Diagnosis Date  . GERD (gastroesophageal reflux disease)   . Hypertension   . Hypothyroidism     SURGICAL HISTORY: Past Surgical History:  Procedure Laterality Date  . CHOLECYSTECTOMY    . COLONOSCOPY N/A 12/02/2016   Procedure: COLONOSCOPY;  Surgeon: Lollie Sails, MD;  Location: [facility] ENDOSCOPY;  Service: Endoscopy;  Laterality: N/A;  . ERCP N/A 04/30/2018   Procedure: ENDOSCOPIC RETROGRADE CHOLANGIOPANCREATOGRAPHY (ERCP);  Surgeon: Lucilla Lame, MD;  Location: [facility] ENDOSCOPY;  Service: Endoscopy;  Laterality: N/A;    SOCIAL HISTORY: Social History   Socioeconomic History  . Marital status: Married    Spouse name: Not on file  . Number of children: Not on file  . Years of education: Not on  file  . Highest education level: Not on file  Occupational History  . Not  on file  Social Needs  . Financial resource strain: Not on file  . Food insecurity:    Worry: Not on file    Inability: Not on file  . Transportation needs:    Medical: Not on file    Non-medical: Not on file  Tobacco Use  . Smoking status: Former Smoker    Last attempt to quit: 04/14/1988    Years since quitting: 30.0  . Smokeless tobacco: Never Used  Substance and Sexual Activity  . Alcohol use: Yes    Comment: social drinker  . Drug use: No  . Sexual activity: Not on file  Lifestyle  . Physical activity:    Days per week: Not on file    Minutes per session: Not on file  . Stress: Not on file  Relationships  . Social connections:    Talks on phone: Not on file    Gets together: Not on file    Attends religious service: Not on file    Active member of club or organization: Not on file    Attends meetings of clubs or organizations: Not on file    Relationship status: Not on file  . Intimate partner violence:    Fear of current or ex partner: Not on file    Emotionally abused: Not on file    Physically abused: Not on file    Forced sexual activity: Not on file  Other Topics Concern  . Not on file  Social History Narrative  . Not on file    FAMILY HISTORY: Family History  Problem Relation Age of Onset  . Diabetes Mellitus I Other   . Alcoholism Other   . Hypertension Other   . Hyperlipidemia Other   . Coronary artery disease Other   . Stroke Other   . Osteoarthritis Other   . Migraines Other   . Heart Problems Mother   . Stroke Sister   . CAD Brother   . Stroke Brother   . Breast cancer Neg Hx     ALLERGIES:  has No Known Allergies.  MEDICATIONS:  Current Outpatient Medications  Medication Sig Dispense Refill  . aspirin EC 81 MG tablet Take 81 mg by mouth daily.    . cholecalciferol (VITAMIN D3) 25 MCG (1000 UT) tablet Take 1,000 Units by mouth daily.    . cholestyramine (QUESTRAN) 4 g packet Take 1 packet (4 g total) by mouth 3 (three) times daily. 90  each 0  . hydrOXYzine (ATARAX/VISTARIL) 25 MG tablet Take 0.5 tablets (12.5 mg total) by mouth at bedtime as needed. (Patient taking differently: Take 12.5 mg by mouth at bedtime as needed for anxiety or itching. ) 15 tablet 0  . levothyroxine (SYNTHROID, LEVOTHROID) 100 MCG tablet Take 1 tablet (100 mcg total) by mouth daily before breakfast. 90 tablet 4  . olmesartan-hydrochlorothiazide (BENICAR HCT) 20-12.5 MG tablet Take 1 tablet by mouth daily. 30 tablet 5   No current facility-administered medications for this visit.      PHYSICAL EXAMINATION: ECOG PERFORMANCE STATUS: 0 - Asymptomatic Vitals:   05/04/18 0853  BP: (!) 151/78  Pulse: 69  Resp: 16  Temp: (!) 96.5 F (35.8 C)   Filed Weights   05/04/18 0853  Weight: 187 lb 4 oz (84.9 kg)    Physical Exam  Constitutional: She is oriented to person, place, and time. No distress.  HENT:  Head: Normocephalic and atraumatic.  Mouth/Throat: Oropharynx is clear and moist.  Eyes: Pupils are equal, round, and reactive to light. EOM are normal. Scleral icterus is present.  Neck: Normal range of motion. Neck supple.  Cardiovascular: Normal rate, regular rhythm and normal heart sounds.  Pulmonary/Chest: Effort normal. No respiratory distress. She has no wheezes.  Abdominal: Soft. Bowel sounds are normal. She exhibits no distension. There is no tenderness.  Musculoskeletal: Normal range of motion. She exhibits no edema or deformity.  Neurological: She is alert and oriented to person, place, and time. No cranial nerve deficit. Coordination normal.  Skin: Skin is warm and dry. No rash noted. No erythema.  Psychiatric: She has a normal mood and affect.     LABORATORY DATA:  I have reviewed the data as listed Lab Results  Component Value Date   WBC 10.2 05/01/2018   HGB 10.7 (L) 05/01/2018   HCT 29.4 (L) 05/01/2018   MCV 76.2 (L) 05/01/2018   PLT 258 05/01/2018   Recent Labs    04/21/18 1046  04/29/18 0612 04/30/18 0408  05/01/18 0404 05/02/18 0751  NA 137   < > 136 133* 133* 133*  K 3.8   < > 3.3* 4.0 3.7 3.6  CL 100   < > 108 104 102 101  CO2 22   < > 23 23 25 24   GLUCOSE 95   < > 101* 110* 101* 101*  BUN 23   < > 9 10 10 8   CREATININE 1.11*   < > 0.49 0.43* 0.69 0.74  CALCIUM 10.6*   < > 9.2 9.1 9.1 9.4  GFRNONAA 50*   < > >60 >60 >60 >60  GFRAA 57*   < > >60 >60 >60 >60  PROT 7.7   < >  --  6.7 6.7 6.7  ALBUMIN 3.9   < >  --  2.6* 2.5* 2.5*  AST 61*   < >  --  53* 50* 52*  ALT 121*   < >  --  46* 41 40  ALKPHOS 275*   < >  --  221* 223* 218*  BILITOT 11.7*   < > 13.7* 14.6* 14.3* 13.8*  BILIDIR 8.84*  --  8.2*  --   --   --    < > = values in this interval not displayed.   Iron/TIBC/Ferritin/ %Sat    Component Value Date/Time   IRON 97 04/09/2018 1440   TIBC 340 04/09/2018 1440   FERRITIN 1,877 (H) 04/09/2018 1440   IRONPCTSAT 29 04/09/2018 1440        ASSESSMENT & PLAN:  1. Malignant neoplasm of head of pancreas (HCC)   2. Jaundice   3. Biliary tract obstruction   4. Hyperbilirubinemia   5. Hyponatremia    I independently reviewed the patient's ultrasound, MR/MRCP abdomen, and reviewed patient's pathological reports. Biliary brushing showed positive for adenocarcinoma. CA-19-9 extremely elevated.  Pancreatic mass. Consistent with pancreatic cancer with better obstruction status post stenting. The diagnosis and care plan were discussed with patient in detail.  I would obtain CT chest Sharlee Blew /pelvis with pancreatic protocol for further evaluation to see if patient is a surgical candidate.  Patient has good performance status.  Will refer to [facility] pancreatic surgeons if CT showed resectable disease. Meanwhile she still has jaundice on physical examination.  Repeat CMP.  Labs reviewed.  Persistent hyper bilirubinemia.  Discussed with Dr. Allen Norris via secure chat, possible stent dislocation. Dr.Wohl's office will call patient for ERCP tmr.   # hyponatremia,  possibly due to poor p.o.  intake/nausea.  I will send Zofran prescription to her pharmacy.  Plan IV normal saline x1 today. We spent sufficient time to discuss many aspect of care, questions were answered to patient's satisfaction.  Orders Placed This Encounter  Procedures  . CT Abdomen Pelvis W Contrast    Standing Status:   Future    Standing Expiration Date:   05/04/2019    Order Specific Question:   ** REASON FOR EXAM (FREE TEXT)    Answer:   pancreatic cancer. pancreatic protocol    Order Specific Question:   If indicated for the ordered procedure, I authorize the administration of contrast media per Radiology protocol    Answer:   Yes    Order Specific Question:   Preferred imaging location?    Answer:   [facility]    Order Specific Question:   Is Oral Contrast requested for this exam?    Answer:   Yes, Per Radiology protocol    Order Specific Question:   Radiology Contrast Protocol - do NOT remove file path    Answer:   \\[email or web address]  . CT Chest W Contrast    Standing Status:   Future    Standing Expiration Date:   05/04/2019    Order Specific Question:   ** REASON FOR EXAM (FREE TEXT)    Answer:   staging for pancreatic cancer    Order Specific Question:   If indicated for the ordered procedure, I authorize the administration of contrast media per Radiology protocol    Answer:   Yes    Order Specific Question:   Preferred imaging location?    Answer:   [facility]    Order Specific Question:   Radiology Contrast Protocol - do NOT remove file path    Answer:   \\[email or web address]  . CBC with Differential/Platelet    Standing Status:   Future    Standing Expiration Date:   05/05/2019  . Comprehensive metabolic panel    Standing Status:   Future    Number of Occurrences:   1    Standing Expiration Date:   05/05/2019  . Retic Panel    Standing Status:   Future    Standing Expiration Date:   05/05/2019    All questions were  answered. The patient knows to call the clinic with any problems questions or concerns.  Return of visit: Total face to face encounter time for this patient visit was 45 min. >50% of the time was  spent in counseling and coordination of care.   Earlie Server, MD, PhD Hematology Oncology [facility] Pager- [phone] 05/04/2018

## 2018-05-05 ENCOUNTER — Ambulatory Visit
Admission: RE | Admit: 2018-05-05 | Discharge: 2018-05-05 | Disposition: A | Discharge status: Home / Self Care | Payer: Medicare Other | Source: Ambulatory Visit | Attending: Gastroenterology | Admitting: Gastroenterology

## 2018-05-05 ENCOUNTER — Ambulatory Visit: Payer: Medicare Other

## 2018-05-05 ENCOUNTER — Ambulatory Visit: Payer: Medicare Other | Admitting: Anesthesiology

## 2018-05-05 ENCOUNTER — Encounter: Payer: Self-pay | Admitting: Student

## 2018-05-05 ENCOUNTER — Inpatient Hospital Stay: Payer: Medicare Other

## 2018-05-05 ENCOUNTER — Other Ambulatory Visit: Payer: Self-pay

## 2018-05-05 ENCOUNTER — Encounter
Admission: RE | Disposition: A | Discharge status: Home / Self Care | Payer: Self-pay | Source: Ambulatory Visit | Attending: Gastroenterology

## 2018-05-05 DIAGNOSIS — Z79890 Hormone replacement therapy (postmenopausal): Secondary | ICD-10-CM | POA: Diagnosis not present

## 2018-05-05 DIAGNOSIS — R17 Unspecified jaundice: Secondary | ICD-10-CM

## 2018-05-05 DIAGNOSIS — Z7982 Long term (current) use of aspirin: Secondary | ICD-10-CM | POA: Insufficient documentation

## 2018-05-05 DIAGNOSIS — Z79899 Other long term (current) drug therapy: Secondary | ICD-10-CM | POA: Diagnosis not present

## 2018-05-05 DIAGNOSIS — R7989 Other specified abnormal findings of blood chemistry: Secondary | ICD-10-CM | POA: Insufficient documentation

## 2018-05-05 DIAGNOSIS — I10 Essential (primary) hypertension: Secondary | ICD-10-CM | POA: Insufficient documentation

## 2018-05-05 DIAGNOSIS — Z87891 Personal history of nicotine dependence: Secondary | ICD-10-CM | POA: Diagnosis not present

## 2018-05-05 DIAGNOSIS — E039 Hypothyroidism, unspecified: Secondary | ICD-10-CM | POA: Insufficient documentation

## 2018-05-05 DIAGNOSIS — Z4659 Encounter for fitting and adjustment of other gastrointestinal appliance and device: Secondary | ICD-10-CM | POA: Diagnosis not present

## 2018-05-05 DIAGNOSIS — K219 Gastro-esophageal reflux disease without esophagitis: Secondary | ICD-10-CM | POA: Diagnosis not present

## 2018-05-05 DIAGNOSIS — K8689 Other specified diseases of pancreas: Secondary | ICD-10-CM | POA: Insufficient documentation

## 2018-05-05 HISTORY — PX: ERCP: SHX5425

## 2018-05-05 SURGERY — ERCP, WITH INTERVENTION IF INDICATED
Anesthesia: General

## 2018-05-05 MED ORDER — LIDOCAINE HCL (CARDIAC) PF 100 MG/5ML IV SOSY
PREFILLED_SYRINGE | INTRAVENOUS | Status: DC | PRN
  Administered 2018-05-05: 80 mg via INTRAVENOUS

## 2018-05-05 MED ORDER — FENTANYL CITRATE (PF) 100 MCG/2ML IJ SOLN
INTRAMUSCULAR | Status: AC
  Filled 2018-05-05: qty 2

## 2018-05-05 MED ORDER — FENTANYL CITRATE (PF) 100 MCG/2ML IJ SOLN
INTRAMUSCULAR | Status: DC | PRN
  Administered 2018-05-05: 25 ug via INTRAVENOUS
  Administered 2018-05-05: 50 ug via INTRAVENOUS

## 2018-05-05 MED ORDER — FENTANYL CITRATE (PF) 100 MCG/2ML IJ SOLN
INTRAMUSCULAR | Status: AC
  Administered 2018-05-05: 25 ug via INTRAVENOUS
  Filled 2018-05-05: qty 2

## 2018-05-05 MED ORDER — PROPOFOL 10 MG/ML IV BOLUS
INTRAVENOUS | Status: DC | PRN
  Administered 2018-05-05: 120 mg via INTRAVENOUS
  Administered 2018-05-05: 20 mg via INTRAVENOUS
  Administered 2018-05-05: 30 mg via INTRAVENOUS

## 2018-05-05 MED ORDER — SUCCINYLCHOLINE CHLORIDE 20 MG/ML IJ SOLN
INTRAMUSCULAR | Status: DC | PRN
  Administered 2018-05-05: 100 mg via INTRAVENOUS

## 2018-05-05 MED ORDER — PHENYLEPHRINE HCL 10 MG/ML IJ SOLN
INTRAMUSCULAR | Status: DC | PRN
  Administered 2018-05-05: 200 ug via INTRAVENOUS

## 2018-05-05 MED ORDER — FENTANYL CITRATE (PF) 100 MCG/2ML IJ SOLN
25.0000 ug | INTRAMUSCULAR | Status: DC | PRN
  Administered 2018-05-05 (×2): 25 ug via INTRAVENOUS

## 2018-05-05 MED ORDER — SODIUM CHLORIDE 0.9 % IV SOLN
INTRAVENOUS | Status: DC
  Administered 2018-05-05: 11:00:00 via INTRAVENOUS

## 2018-05-05 MED ORDER — ONDANSETRON HCL 4 MG/2ML IJ SOLN
INTRAMUSCULAR | Status: DC | PRN
  Administered 2018-05-05: 4 mg via INTRAVENOUS

## 2018-05-05 NOTE — Transfer of Care (Signed)
Immediate Anesthesia Transfer of Care Note  Patient: Lynn Taylor  Procedure(s) Performed: ENDOSCOPIC RETROGRADE CHOLANGIOPANCREATOGRAPHY (ERCP) (N/A )  Patient Location: PACU  Anesthesia Type:General  Level of Consciousness: drowsy  Airway & Oxygen Therapy: Patient Spontanous Breathing and Patient connected to face mask oxygen  Post-op Assessment: Report given to RN and Post -op Vital signs reviewed and stable  Post vital signs: Reviewed and stable  Last Vitals:  Vitals Value Taken Time  BP 128/68 05/05/2018 11:53 AM  Temp 36.4 C 05/05/2018 11:53 AM  Pulse 83 05/05/2018 11:54 AM  Resp 17 05/05/2018 11:54 AM  SpO2 100 % 05/05/2018 11:54 AM  Vitals shown include unvalidated device data.  Last Pain:  Vitals:   05/05/18 1020  TempSrc: Tympanic  PainSc: 0-No pain         Complications: No apparent anesthesia complications

## 2018-05-05 NOTE — Anesthesia Post-op Follow-up Note (Signed)
Anesthesia QCDR form completed.        

## 2018-05-05 NOTE — Anesthesia Postprocedure Evaluation (Signed)
Anesthesia Post Note  Patient: Lynn Taylor  Procedure(s) Performed: ENDOSCOPIC RETROGRADE CHOLANGIOPANCREATOGRAPHY (ERCP) (N/A )  Patient location during evaluation: PACU Anesthesia Type: General Level of consciousness: awake and alert Pain management: pain level controlled Vital Signs Assessment: post-procedure vital signs reviewed and stable Respiratory status: spontaneous breathing, nonlabored ventilation, respiratory function stable and patient connected to nasal cannula oxygen Cardiovascular status: blood pressure returned to baseline and stable Postop Assessment: no apparent nausea or vomiting Anesthetic complications: no     Last Vitals:  Vitals:   05/05/18 1223 05/05/18 1231  BP: 119/60 126/66  Pulse: 75 78  Resp: 14 16  Temp:    SpO2: 93% 96%    Last Pain:  Vitals:   05/05/18 1231  TempSrc:   PainSc: [place]

## 2018-05-05 NOTE — Anesthesia Preprocedure Evaluation (Addendum)
Anesthesia Evaluation  Patient identified by MRN, date of birth, ID band Patient awake    Reviewed: Allergy & Precautions, H&P , NPO status , Patient's Chart, lab work & pertinent test results  Airway Mallampati: III       Dental  (+) Edentulous Lower, Edentulous Upper   Pulmonary former smoker,           Cardiovascular hypertension,   Echo 04/12/18: - Left ventricle: The cavity size was normal. Systolic function was normal. The estimated ejection fraction was in the range of 55% to 60%.   Neuro/Psych negative neurological ROS  negative psych ROS   GI/Hepatic Neg liver ROS, GERD  ,  Endo/Other  Hypothyroidism   Renal/GU      Musculoskeletal   Abdominal   Peds  Hematology negative hematology ROS (+)   Anesthesia Other Findings 72 yo with pancreatic mass causing CBD obstruction, s/p stent placement 04/30/18, now with persistently abnormal labs and possible stent dislocation, presenting for repeat ERCP.  +scleral icterus  Reproductive/Obstetrics negative OB ROS                           Anesthesia Physical Anesthesia Plan  ASA: III  Anesthesia Plan: General ETT   Post-op Pain Management:    Induction:   PONV Risk Score and Plan: Ondansetron and Treatment may vary due to age or medical condition  Airway Management Planned:   Additional Equipment:   Intra-op Plan:   Post-operative Plan:   Informed Consent: I have reviewed the patients History and Physical, chart, labs and discussed the procedure including the risks, benefits and alternatives for the proposed anesthesia with the patient or authorized representative who has indicated his/her understanding and acceptance.   Dental Advisory Given  Plan Discussed with: Anesthesiologist  Anesthesia Plan Comments:         Anesthesia Quick Evaluation

## 2018-05-05 NOTE — Anesthesia Procedure Notes (Signed)
Procedure Name: Intubation Date/Time: 05/05/2018 11:15 AM Performed by: Johnna Acosta, CRNA Pre-anesthesia Checklist: Patient identified, Emergency Drugs available, Suction available, Patient being monitored and Timeout performed Patient Re-evaluated:Patient Re-evaluated prior to induction Oxygen Delivery Method: Circle system utilized Preoxygenation: Pre-oxygenation with 100% oxygen Induction Type: IV induction Laryngoscope Size: Miller and 2 Grade View: Grade I Tube type: Oral Tube size: 7.0 mm Number of attempts: 1 Airway Equipment and Method: Stylet Placement Confirmation: ETT inserted through vocal cords under direct vision,  positive ETCO2 and breath sounds checked- equal and bilateral Secured at: 21 cm Tube secured with: Tape Dental Injury: Teeth and Oropharynx as per pre-operative assessment

## 2018-05-05 NOTE — Op Note (Signed)
[facility] Gastroenterology Patient Name: Lynn Taylor Procedure Date: 05/05/2018 11:09 AM MRN: [id number] Account #: [id number] Date of Birth: [birth year] Admit Type: Outpatient Age: 72 Room: [facility] ENDO ROOM 4 Gender: Female Note Status: Finalized Procedure:            ERCP Indications:          Stent change due to failure of the LFT's to come down                        and likely migration of the stent. Providers:            Lucilla Lame MD, MD Referring MD:         Forest Gleason Md, MD (Referring MD) Medicines:            General Anesthesia Complications:        No immediate complications. Procedure:            Pre-Anesthesia Assessment:                       - Prior to the procedure, a History and Physical was                        performed, and patient medications and allergies were                        reviewed. The patient's tolerance of previous                        anesthesia was also reviewed. The risks and benefits of                        the procedure and the sedation options and risks were                        discussed with the patient. All questions were                        answered, and informed consent was obtained. Prior                        Anticoagulants: The patient has taken no previous                        anticoagulant or antiplatelet agents. ASA Grade                        Assessment: II - A patient with mild systemic disease.                        After reviewing the risks and benefits, the patient was                        deemed in satisfactory condition to undergo the                        procedure.                       After obtaining informed consent, the scope was passed  under direct vision. Throughout the procedure, the                        patient's blood pressure, pulse, and oxygen saturations                        were monitored continuously. The Duodenoscope was   introduced through the mouth, and used to inject                        contrast into and used to inject contrast into the bile                        duct. The ERCP was accomplished without difficulty. The                        patient tolerated the procedure well. Findings:      A scout film of the abdomen was obtained. One stent ending in the main       bile duct was seen. One stent was removed from the biliary tree using a       snare. The bile duct was deeply cannulated with the short-nosed traction       sphincterotome. Contrast was injected. I personally interpreted the bile       duct images. There was brisk flow of contrast through the ducts. Image       quality was excellent. Contrast extended to the main bile duct. A wire       was passed into the biliary tree. One 10 Fr by 9 cm plastic stent with a       single external flap and a single internal flap was placed 7 cm into the       common bile duct. Bile flowed through the stent. The stent was in good       position. Impression:           - One stent was removed from the biliary tree.                       - One plastic stent was placed into the common bile                        duct. Recommendation:       - Discharge patient to home.                       - Resume regular diet.                       - Continue present medications. Procedure Code(s):    --- Professional ---                       [phone], Endoscopic retrograde cholangiopancreatography                        (ERCP); with removal and exchange of stent(s), biliary                        or pancreatic duct, including pre- and post-dilation  and guide wire passage, when performed, including                        sphincterotomy, when performed, each stent exchanged                       [phone], Endoscopic catheterization of the biliary ductal                        system, radiological supervision and interpretation Diagnosis Code(s):    ---  Professional ---                       Z46.59, Encounter for fitting and adjustment of other                        gastrointestinal appliance and device CPT copyright 2018 [employer]. All rights reserved. The codes documented in this report are preliminary and upon coder review may  be revised to meet current compliance requirements. Lucilla Lame MD, MD 05/05/2018 11:46:51 AM This report has been signed electronically. Number of Addenda: 0 Note Initiated On: 05/05/2018 11:09 AM      [facility]

## 2018-05-05 NOTE — H&P (Signed)
Lucilla Lame, MD [address] [place], [place] [address] Phone:[phone] Fax : [phone]  Primary Care Physician:  Kendell Bane, NP Primary Gastroenterologist:  Dr. Allen Norris  Pre-Procedure History & Physical: HPI:  Lynn Taylor is a 72 y.o. female is here for an ERCP.   Past Medical History:  Diagnosis Date  . Cancer ([place])   . GERD (gastroesophageal reflux disease)   . Hypertension   . Hypothyroidism     Past Surgical History:  Procedure Laterality Date  . CHOLECYSTECTOMY    . COLONOSCOPY N/A 12/02/2016   Procedure: COLONOSCOPY;  Surgeon: Lollie Sails, MD;  Location: [facility] ENDOSCOPY;  Service: Endoscopy;  Laterality: N/A;  . ERCP N/A 04/30/2018   Procedure: ENDOSCOPIC RETROGRADE CHOLANGIOPANCREATOGRAPHY (ERCP);  Surgeon: Lucilla Lame, MD;  Location: [facility] ENDOSCOPY;  Service: Endoscopy;  Laterality: N/A;    Prior to Admission medications   Medication Sig Start Date End Date Taking? Authorizing Provider  aspirin EC 81 MG tablet Take 81 mg by mouth daily.   Yes [provider]  cholecalciferol (VITAMIN D3) 25 MCG (1000 UT) tablet Take 1,000 Units by mouth daily.   Yes [provider]  cholestyramine (QUESTRAN) 4 g packet Take 1 packet (4 g total) by mouth 3 (three) times daily. 05/02/18  Yes Salary, Avel Peace, MD  hydrOXYzine (ATARAX/VISTARIL) 25 MG tablet Take 0.5 tablets (12.5 mg total) by mouth at bedtime as needed. Patient taking differently: Take 12.5 mg by mouth at bedtime as needed for anxiety or itching.  04/09/18  Yes Scarboro, Audie Clear, NP  levothyroxine (SYNTHROID, LEVOTHROID) 100 MCG tablet Take 1 tablet (100 mcg total) by mouth daily before breakfast. 11/19/17  Yes Boscia, Heather E, NP  olmesartan-hydrochlorothiazide (BENICAR HCT) 20-12.5 MG tablet Take 1 tablet by mouth daily. 12/16/17  Yes Boscia, Heather E, NP  ondansetron (ZOFRAN) 4 MG tablet Take 1 tablet (4 mg total) by mouth every 6 (six) hours as needed for nausea or  vomiting. 05/04/18  Yes Earlie Server, MD    Allergies as of 05/04/2018  . (No Known Allergies)    Family History  Problem Relation Age of Onset  . Diabetes Mellitus I Other   . Alcoholism Other   . Hypertension Other   . Hyperlipidemia Other   . Coronary artery disease Other   . Stroke Other   . Osteoarthritis Other   . Migraines Other   . Heart Problems Mother   . Stroke Sister   . CAD Brother   . Stroke Brother   . Breast cancer Neg Hx     Social History   Socioeconomic History  . Marital status: Married    Spouse name: Not on file  . Number of children: Not on file  . Years of education: Not on file  . Highest education level: Not on file  Occupational History  . Not on file  Social Needs  . Financial resource strain: Not on file  . Food insecurity:    Worry: Not on file    Inability: Not on file  . Transportation needs:    Medical: Not on file    Non-medical: Not on file  Tobacco Use  . Smoking status: Former Smoker    Last attempt to quit: 04/14/1988    Years since quitting: 30.0  . Smokeless tobacco: Never Used  Substance and Sexual Activity  . Alcohol use: Yes    Comment: social drinker  . Drug use: No  . Sexual activity: Not on file  Lifestyle  . Physical activity:    Days per week: Not on file    Minutes per session: Not on file  . Stress: Not on file  Relationships  . Social connections:    Talks on phone: Not on file    Gets together: Not on file    Attends religious service: Not on file    Active member of club or organization: Not on file    Attends meetings of clubs or organizations: Not on file    Relationship status: Not on file  . Intimate partner violence:    Fear of current or ex partner: Not on file    Emotionally abused: Not on file    Physically abused: Not on file    Forced sexual activity: Not on file  Other Topics Concern  . Not on file  Social History Narrative  . Not on file    Review of Systems: See HPI, otherwise  negative ROS  Physical Exam: BP (!) 167/77   Pulse 81   Temp 97.8 F (36.6 C) (Tympanic)   Resp 18   Ht 5\' 3"  (1.6 m)   Wt 85.3 kg   SpO2 100%   BMI 33.30 kg/m  General:   Alert,  pleasant and cooperative in NAD Head:  Normocephalic and atraumatic. Neck:  Supple; no masses or thyromegaly. Lungs:  Clear throughout to auscultation.    Heart:  Regular rate and rhythm. Abdomen:  Soft, nontender and nondistended. Normal bowel sounds, without guarding, and without rebound.   Neurologic:  Alert and  oriented x4;  grossly normal neurologically.  Impression/Plan: Lynn Taylor is here for an ERCP to be performed for stent change.  Risks, benefits, limitations, and alternatives regarding  colonoscopy have been reviewed with the patient.  Questions have been answered.  All parties agreeable.   Lucilla Lame, MD  05/05/2018, 10:49 AM

## 2018-05-08 ENCOUNTER — Encounter: Payer: Self-pay | Admitting: Oncology

## 2018-05-08 DIAGNOSIS — Z7189 Other specified counseling: Secondary | ICD-10-CM

## 2018-05-08 HISTORY — DX: Other specified counseling: Z71.89

## 2018-05-10 ENCOUNTER — Ambulatory Visit (INDEPENDENT_AMBULATORY_CARE_PROVIDER_SITE_OTHER): Payer: Medicare Other | Admitting: Gastroenterology

## 2018-05-10 ENCOUNTER — Encounter: Payer: Self-pay | Admitting: Gastroenterology

## 2018-05-10 VITALS — BP 153/75 | HR 78 | Ht 63.0 in | Wt 186.0 lb

## 2018-05-10 DIAGNOSIS — R17 Unspecified jaundice: Secondary | ICD-10-CM | POA: Diagnosis not present

## 2018-05-10 DIAGNOSIS — K5900 Constipation, unspecified: Secondary | ICD-10-CM

## 2018-05-10 NOTE — Progress Notes (Signed)
Jonathon Bellows MD, MRCP(U.K) [address]  [place]  [place], [place] [address]  Main: [phone]  Fax: [phone]   Primary Care Physician: Kendell Bane, NP  Primary Gastroenterologist:  Dr. Jonathon Bellows   Chief Complaint  Patient presents with  . Hospitalization Follow-up    Jaundice    HPI: Lynn Taylor is a 72 y.o. female    Summary of history : She is here today for a hospital follow up . Was admitted 04/28/18 for jaundice and tumor in head of pancreas  s/p ERCP , had to be repeated a week later due to T bilirubin not coming down from likely migrated stent.    Interval history   05/02/2018-  05/10/2018  Main issue is constipation. Very hard.    Current Outpatient Medications  Medication Sig Dispense Refill  . aspirin EC 81 MG tablet Take 81 mg by mouth daily.    . cholecalciferol (VITAMIN D3) 25 MCG (1000 UT) tablet Take 1,000 Units by mouth daily.    . cholestyramine (QUESTRAN) 4 g packet Take 1 packet (4 g total) by mouth 3 (three) times daily. 90 each 0  . hydrOXYzine (ATARAX/VISTARIL) 25 MG tablet Take 0.5 tablets (12.5 mg total) by mouth at bedtime as needed. (Patient taking differently: Take 12.5 mg by mouth at bedtime as needed for anxiety or itching. ) 15 tablet 0  . levothyroxine (SYNTHROID, LEVOTHROID) 100 MCG tablet Take 1 tablet (100 mcg total) by mouth daily before breakfast. 90 tablet 4  . ondansetron (ZOFRAN) 4 MG tablet Take 1 tablet (4 mg total) by mouth every 6 (six) hours as needed for nausea or vomiting. 30 tablet 3  . olmesartan-hydrochlorothiazide (BENICAR HCT) 20-12.5 MG tablet Take 1 tablet by mouth daily. (Patient not taking: Reported on 05/10/2018) 30 tablet 5   No current facility-administered medications for this visit.     Allergies as of 05/10/2018  . (No Known Allergies)    ROS:  General: Negative for anorexia, weight loss, fever, chills, fatigue, weakness. ENT: Negative for hoarseness, difficulty swallowing , nasal  congestion. CV: Negative for chest pain, angina, palpitations, dyspnea on exertion, peripheral edema.  Respiratory: Negative for dyspnea at rest, dyspnea on exertion, cough, sputum, wheezing.  GI: See history of present illness. GU:  Negative for dysuria, hematuria, urinary incontinence, urinary frequency, nocturnal urination.  Endo: Negative for unusual weight change.    Physical Examination:   BP (!) 153/75   Pulse 78   Ht 5\' 3"  (1.6 m)   Wt 186 lb (84.4 kg)   BMI 32.95 kg/m   General: Well-nourished, well-developed in no acute distress.  Eyes: No icterus. Conjunctivae pink. Mouth: Oropharyngeal mucosa moist and pink , no lesions erythema or exudate. Lungs: Clear to auscultation bilaterally. Non-labored. Heart: Regular rate and rhythm, no murmurs rubs or gallops.  Abdomen: Bowel sounds are normal, nontender, nondistended, no hepatosplenomegaly or masses, no abdominal bruits or hernia , no rebound or guarding.   Extremities: No lower extremity edema. No clubbing or deformities. Neuro: Alert and oriented x 3.  Grossly intact. Skin: Warm and dry, no jaundice.   Psych: Alert and cooperative, normal mood and affect.   Imaging Studies: Dg Chest 2 View  Result Date: 04/11/2018 CLINICAL DATA:  Chest discomfort and shortness of breath for 1 week EXAM: CHEST - 2 VIEW COMPARISON:  None. FINDINGS: Cardiac shadows within normal limits. The lungs are well aerated bilaterally. No focal infiltrate or sizable effusion is seen. No acute bony abnormality is  noted. IMPRESSION: No active cardiopulmonary disease. Electronically Signed   By: Inez Catalina M.D.   On: 04/11/2018 17:53   Mr 3d Recon At Scanner  Result Date: 04/29/2018 CLINICAL DATA:  Elevated bilirubin level. Mild nausea. History gastroesophageal reflux disease. EXAM: MRI ABDOMEN WITHOUT AND WITH CONTRAST (INCLUDING MRCP) TECHNIQUE: Multiplanar multisequence MR imaging of the abdomen was performed both before and after the administration  of intravenous contrast. Heavily T2-weighted images of the biliary and pancreatic ducts were obtained, and three-dimensional MRCP images were rendered by post processing. CONTRAST:  8 cc Gadavist COMPARISON:  Ultrasound 04/28/2018 FINDINGS: Portions of exam are minimally motion degraded. Lower chest: Normal heart size without pericardial or pleural effusion. Hepatobiliary: No suspicious liver lesion. A probable cyst in the lateral segment left liver lobe measures 6 mm. Cholecystectomy. Marked intra and extrahepatic biliary duct dilatation. The common duct measures on the order of 2.3 cm on image 15/6. Is obstructed in the region of the pancreatic head (approximately 3.4 cm cephalad to the ampulla on 31/15). Pancreas: Although there is no border deforming pancreatic lesion identified, there is subtle precontrast T1 hypointensity (image 50/21) and delayed relative hyperenhancement (image 51/30) within the pancreatic head/uncinate process. This measures on the order of 2.2 x 2.5 cm. This area contacts the adjacent superior mesenteric vein, including on image 52/30. No narrowing. No acute pancreatitis. Suspicion of pancreas divisum, with the prominent dorsal duct entering the duodenum on image 38/15 Spleen:  Normal in size, without focal abnormality. Adrenals/Urinary Tract: Normal adrenal glands. Normal kidneys, without hydronephrosis. Stomach/Bowel: Normal stomach and abdominal bowel loops. Vascular/Lymphatic: Aortic atherosclerosis. No arterial encasement. The celiac is mildly ectatic. No abdominal adenopathy. Other:  No ascites.  No evidence of omental or peritoneal disease. Musculoskeletal: Mild right hemidiaphragm elevation. IMPRESSION: 1. Marked biliary duct dilatation, secondary to obstruction in the region of the pancreatic head. 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 Extrahepatic cholangiocarcinoma could look similar but is felt less likely. Consider ERCP with  tissue sampling. 2. Minimally motion degraded exam. 3. No abdominal adenopathy, liver metastasis, or gross vascular involvement. If the patient is diagnosed with pancreatic carcinoma and is felt to be a surgical candidate, consider dedicated outpatient pancreatic protocol CT to allow more thorough evaluation of the peripancreatic vasculature. 4. Suspicion of pancreas divisum. 5.  Aortic Atherosclerosis (ICD10-I70.0). Electronically Signed   By: Abigail Miyamoto M.D.   On: 04/29/2018 14:54   Nm Myocar Multi W/spect W/wall Motion / Ef  Result Date: 04/12/2018  The study is normal.  This is a low risk study.  The left ventricular ejection fraction is normal (55-65%).  Blood pressure demonstrated a normal response to exercise.  There was no ST segment deviation noted during stress.    Dg C-arm 1-60 Min-no Report  Result Date: 05/05/2018 Fluoroscopy was utilized by the requesting physician.  No radiographic interpretation.   Dg C-arm 1-60 Min-no Report  Result Date: 04/30/2018 Fluoroscopy was utilized by the requesting physician.  No radiographic interpretation.   Mr Abdomen Mrcp Moise Boring Contast  Result Date: 04/29/2018 CLINICAL DATA:  Elevated bilirubin level. Mild nausea. History gastroesophageal reflux disease. EXAM: MRI ABDOMEN WITHOUT AND WITH CONTRAST (INCLUDING MRCP) TECHNIQUE: Multiplanar multisequence MR imaging of the abdomen was performed both before and after the administration of intravenous contrast. Heavily T2-weighted images of the biliary and pancreatic ducts were obtained, and three-dimensional MRCP images were rendered by post processing. CONTRAST:  8 cc Gadavist COMPARISON:  Ultrasound 04/28/2018 FINDINGS: Portions of exam are  minimally motion degraded. Lower chest: Normal heart size without pericardial or pleural effusion. Hepatobiliary: No suspicious liver lesion. A probable cyst in the lateral segment left liver lobe measures 6 mm. Cholecystectomy. Marked intra and extrahepatic  biliary duct dilatation. The common duct measures on the order of 2.3 cm on image 15/6. Is obstructed in the region of the pancreatic head (approximately 3.4 cm cephalad to the ampulla on 31/15). Pancreas: Although there is no border deforming pancreatic lesion identified, there is subtle precontrast T1 hypointensity (image 50/21) and delayed relative hyperenhancement (image 51/30) within the pancreatic head/uncinate process. This measures on the order of 2.2 x 2.5 cm. This area contacts the adjacent superior mesenteric vein, including on image 52/30. No narrowing. No acute pancreatitis. Suspicion of pancreas divisum, with the prominent dorsal duct entering the duodenum on image 38/15 Spleen:  Normal in size, without focal abnormality. Adrenals/Urinary Tract: Normal adrenal glands. Normal kidneys, without hydronephrosis. Stomach/Bowel: Normal stomach and abdominal bowel loops. Vascular/Lymphatic: Aortic atherosclerosis. No arterial encasement. The celiac is mildly ectatic. No abdominal adenopathy. Other:  No ascites.  No evidence of omental or peritoneal disease. Musculoskeletal: Mild right hemidiaphragm elevation. IMPRESSION: 1. Marked biliary duct dilatation, secondary to obstruction in the region of the pancreatic head. 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 Extrahepatic cholangiocarcinoma could look similar but is felt less likely. Consider ERCP with tissue sampling. 2. Minimally motion degraded exam. 3. No abdominal adenopathy, liver metastasis, or gross vascular involvement. If the patient is diagnosed with pancreatic carcinoma and is felt to be a surgical candidate, consider dedicated outpatient pancreatic protocol CT to allow more thorough evaluation of the peripancreatic vasculature. 4. Suspicion of pancreas divisum. 5.  Aortic Atherosclerosis (ICD10-I70.0). Electronically Signed   By: Abigail Miyamoto M.D.   On: 04/29/2018 14:54   US Abdomen Limited  Ruq  Result Date: 04/28/2018 CLINICAL DATA:  73 year old female with elevated ferritin. History of remote cholecystectomy. EXAM: ULTRASOUND ABDOMEN LIMITED RIGHT UPPER QUADRANT COMPARISON:  None. FINDINGS: Gallbladder: Not visualized compatible with cholecystectomy. Common bile duct: Diameter: 18 mm. Intrahepatic biliary dilatation identified. The LOWER CBD is not well visualized. Liver: Echogenicity is within normal limits. No focal abnormalities are present. Portal vein is patent on color Doppler imaging with normal direction of blood flow towards the liver. IMPRESSION: 1. CBD and intrahepatic biliary dilatation, cause not identified on this study. GI consultation and ERCP/MRCP recommended. 2. Liver otherwise unremarkable. 3. Status post cholecystectomy. Electronically Signed   By: Margarette Canada M.D.   On: 04/28/2018 10:04    Assessment and Plan:   Lynn Taylor is a 72 y.o. 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 Follow with Oncology. S/p ERCP and stenting . Being referred to [facility] for surgery evaluation.    Plan  1. Check LFT's to ensure trending down with bilirubin.  2. Trial of Linzess 145 mcg- advised to call if not working     Dr Jonathon Bellows  MD,MRCP [facility]) Follow up PRN

## 2018-05-11 ENCOUNTER — Encounter: Payer: Self-pay | Admitting: Gastroenterology

## 2018-05-11 LAB — HEPATIC FUNCTION PANEL
ALT: 80 IU/L — ABNORMAL HIGH (ref 0–32)
AST: 94 IU/L — ABNORMAL HIGH (ref 0–40)
Albumin: 3.8 g/dL (ref 3.5–4.8)
Alkaline Phosphatase: 200 IU/L — ABNORMAL HIGH (ref 39–117)
Bilirubin Total: 6.2 mg/dL — ABNORMAL HIGH (ref 0.0–1.2)
Bilirubin, Direct: 4.58 mg/dL — ABNORMAL HIGH (ref 0.00–0.40)
Total Protein: 7.3 g/dL (ref 6.0–8.5)

## 2018-05-12 ENCOUNTER — Telehealth: Payer: Self-pay

## 2018-05-12 ENCOUNTER — Other Ambulatory Visit: Payer: Self-pay

## 2018-05-12 DIAGNOSIS — R17 Unspecified jaundice: Secondary | ICD-10-CM

## 2018-05-12 NOTE — Telephone Encounter (Signed)
Spoke with pt and informed her of lab results and Dr. Georgeann Oppenheim instructions to repeat LFT labs in 2 weeks.

## 2018-05-12 NOTE — Telephone Encounter (Signed)
-----   Message from Jonathon Bellows, MD sent at 05/11/2018  8:16 AM EST ----- Sherald Hess inform LFT's have improved - recheck in 2 weeks

## 2018-05-13 ENCOUNTER — Other Ambulatory Visit: Payer: Medicare Other

## 2018-05-13 ENCOUNTER — Telehealth: Payer: Self-pay

## 2018-05-13 NOTE — Progress Notes (Signed)
Tumor Board Documentation  Lynn Taylor was presented by Dr Tasia Catchings at our Tumor Board on 05/13/2018, which included representatives from medical oncology, radiation oncology, surgical, radiology, pathology, navigation, internal medicine, research, pulmonology.  Lynn Taylor currently presents as a current patient with history of the following treatments: surgical intervention(s).  Additionally, we reviewed previous medical and familial history, history of present illness, and recent lab results along with all available histopathologic and imaging studies. The tumor board considered available treatment options and made the following recommendations: Additional screening Possible resection depending on results of CT  The following procedures/referrals were also placed: No orders of the defined types were placed in this encounter.   Clinical Trial Status: not discussed   Staging used: AJCC Stage Group  National site-specific guidelines NCCN were discussed with respect to the case.  Tumor board is a meeting of clinicians from various specialty areas who evaluate and discuss patients for whom a multidisciplinary approach is being considered. Final determinations in the plan of care are those of the provider(s). The responsibility for follow up of recommendations given during tumor board is that of the provider.   Today's extended care, comprehensive team conference, Lynn Taylor was not present for the discussion and was not examined.   Multidisciplinary Tumor Board is a multidisciplinary case peer review process.  Decisions discussed in the Multidisciplinary Tumor Board reflect the opinions of the specialists present at the conference without having examined the patient.  Ultimately, treatment and diagnostic decisions rest with the primary provider(s) and the patient.

## 2018-05-13 NOTE — Telephone Encounter (Signed)
Called and updated Ms. Marzetta Board with plan of care after tumor board. She will go for her CT 12/6. She will obtain her images of MRI and CT from radiology desk before she leaves. We have arranged for her to see Dr. Binnie Rail, [facility] surgeon at [facility], 12/9 at 1100. I have given her directions and location of clinic. She was instructed to call with any questions or needs. Oncology Nurse Navigator Documentation  Navigator Location: [facility] (05/13/18 1500)   )Navigator Encounter Type: Telephone (05/13/18 1500) Telephone: Lahoma Crocker Call;Patient Update (05/13/18 1500)                       Barriers/Navigation Needs: Coordination of Care (05/13/18 1500)   Interventions: Referrals;Coordination of Care (05/13/18 1500)                      Time Spent with Patient: 30 (05/13/18 1500)

## 2018-05-14 ENCOUNTER — Ambulatory Visit
Admission: RE | Admit: 2018-05-14 | Discharge: 2018-05-14 | Disposition: A | Discharge status: Home / Self Care | Payer: Medicare Other | Source: Ambulatory Visit | Attending: Oncology | Admitting: Oncology

## 2018-05-14 DIAGNOSIS — C250 Malignant neoplasm of head of pancreas: Secondary | ICD-10-CM | POA: Insufficient documentation

## 2018-05-14 DIAGNOSIS — C259 Malignant neoplasm of pancreas, unspecified: Secondary | ICD-10-CM | POA: Diagnosis not present

## 2018-05-14 DIAGNOSIS — J984 Other disorders of lung: Secondary | ICD-10-CM | POA: Diagnosis not present

## 2018-05-14 MED ORDER — IOPAMIDOL (ISOVUE-300) INJECTION 61%
100.0000 mL | Freq: Once | INTRAVENOUS | Status: AC | PRN
  Administered 2018-05-14: 100 mL via INTRAVENOUS

## 2018-05-17 DIAGNOSIS — K831 Obstruction of bile duct: Secondary | ICD-10-CM | POA: Diagnosis not present

## 2018-05-17 DIAGNOSIS — C259 Malignant neoplasm of pancreas, unspecified: Secondary | ICD-10-CM | POA: Diagnosis not present

## 2018-05-17 DIAGNOSIS — Z87891 Personal history of nicotine dependence: Secondary | ICD-10-CM | POA: Diagnosis not present

## 2018-05-17 DIAGNOSIS — C802 Malignant neoplasm associated with transplanted organ: Secondary | ICD-10-CM | POA: Diagnosis not present

## 2018-05-18 ENCOUNTER — Ambulatory Visit: Payer: Medicare Other | Admitting: Oncology

## 2018-05-20 ENCOUNTER — Encounter: Payer: Self-pay | Admitting: Nurse Practitioner

## 2018-05-20 DIAGNOSIS — K219 Gastro-esophageal reflux disease without esophagitis: Secondary | ICD-10-CM | POA: Diagnosis not present

## 2018-05-20 DIAGNOSIS — Z7189 Other specified counseling: Secondary | ICD-10-CM | POA: Diagnosis not present

## 2018-05-20 DIAGNOSIS — D490 Neoplasm of unspecified behavior of digestive system: Secondary | ICD-10-CM | POA: Diagnosis not present

## 2018-05-20 DIAGNOSIS — C259 Malignant neoplasm of pancreas, unspecified: Secondary | ICD-10-CM | POA: Diagnosis not present

## 2018-05-20 DIAGNOSIS — E039 Hypothyroidism, unspecified: Secondary | ICD-10-CM | POA: Diagnosis not present

## 2018-05-20 DIAGNOSIS — E871 Hypo-osmolality and hyponatremia: Secondary | ICD-10-CM | POA: Diagnosis not present

## 2018-05-20 DIAGNOSIS — Z01818 Encounter for other preprocedural examination: Secondary | ICD-10-CM | POA: Diagnosis not present

## 2018-05-20 DIAGNOSIS — I10 Essential (primary) hypertension: Secondary | ICD-10-CM | POA: Diagnosis not present

## 2018-05-21 ENCOUNTER — Telehealth: Payer: Self-pay | Admitting: *Deleted

## 2018-05-21 DIAGNOSIS — K8689 Other specified diseases of pancreas: Secondary | ICD-10-CM | POA: Diagnosis not present

## 2018-05-21 DIAGNOSIS — C249 Malignant neoplasm of biliary tract, unspecified: Secondary | ICD-10-CM | POA: Diagnosis not present

## 2018-05-21 NOTE — Telephone Encounter (Signed)
Patient needs see Dr. Tasia Catchings appx 2 weeks after surgery to discuss chemo. Surgery is scheduled 12/17. Per Steffanie Dunn 05/21/18 Scheduling message.  Appt was scheduled as requested. Called and made her aware of her 06/08/18 MD appt scheduled for 9:30.

## 2018-05-25 DIAGNOSIS — K5909 Other constipation: Secondary | ICD-10-CM | POA: Diagnosis not present

## 2018-05-25 DIAGNOSIS — C250 Malignant neoplasm of head of pancreas: Secondary | ICD-10-CM | POA: Diagnosis not present

## 2018-05-25 DIAGNOSIS — R918 Other nonspecific abnormal finding of lung field: Secondary | ICD-10-CM | POA: Diagnosis not present

## 2018-05-25 DIAGNOSIS — C772 Secondary and unspecified malignant neoplasm of intra-abdominal lymph nodes: Secondary | ICD-10-CM | POA: Diagnosis not present

## 2018-05-25 DIAGNOSIS — R42 Dizziness and giddiness: Secondary | ICD-10-CM | POA: Diagnosis not present

## 2018-05-25 DIAGNOSIS — E440 Moderate protein-calorie malnutrition: Secondary | ICD-10-CM | POA: Diagnosis not present

## 2018-05-25 DIAGNOSIS — Z7189 Other specified counseling: Secondary | ICD-10-CM | POA: Diagnosis not present

## 2018-05-25 DIAGNOSIS — K8689 Other specified diseases of pancreas: Secondary | ICD-10-CM | POA: Diagnosis not present

## 2018-05-25 DIAGNOSIS — R110 Nausea: Secondary | ICD-10-CM | POA: Diagnosis not present

## 2018-05-25 DIAGNOSIS — C259 Malignant neoplasm of pancreas, unspecified: Secondary | ICD-10-CM | POA: Diagnosis not present

## 2018-05-25 DIAGNOSIS — R188 Other ascites: Secondary | ICD-10-CM | POA: Diagnosis not present

## 2018-05-25 DIAGNOSIS — R112 Nausea with vomiting, unspecified: Secondary | ICD-10-CM | POA: Diagnosis not present

## 2018-05-25 DIAGNOSIS — E039 Hypothyroidism, unspecified: Secondary | ICD-10-CM | POA: Diagnosis not present

## 2018-05-25 DIAGNOSIS — Z9189 Other specified personal risk factors, not elsewhere classified: Secondary | ICD-10-CM | POA: Diagnosis not present

## 2018-05-25 DIAGNOSIS — C257 Malignant neoplasm of other parts of pancreas: Secondary | ICD-10-CM | POA: Diagnosis not present

## 2018-05-25 DIAGNOSIS — E871 Hypo-osmolality and hyponatremia: Secondary | ICD-10-CM | POA: Diagnosis not present

## 2018-05-25 DIAGNOSIS — J9811 Atelectasis: Secondary | ICD-10-CM | POA: Diagnosis not present

## 2018-05-25 DIAGNOSIS — A0472 Enterocolitis due to Clostridium difficile, not specified as recurrent: Secondary | ICD-10-CM | POA: Diagnosis not present

## 2018-05-25 DIAGNOSIS — Z87891 Personal history of nicotine dependence: Secondary | ICD-10-CM | POA: Diagnosis not present

## 2018-05-25 DIAGNOSIS — G8918 Other acute postprocedural pain: Secondary | ICD-10-CM | POA: Diagnosis not present

## 2018-05-25 DIAGNOSIS — I10 Essential (primary) hypertension: Secondary | ICD-10-CM | POA: Diagnosis not present

## 2018-05-25 DIAGNOSIS — K660 Peritoneal adhesions (postprocedural) (postinfection): Secondary | ICD-10-CM | POA: Diagnosis not present

## 2018-05-25 DIAGNOSIS — I959 Hypotension, unspecified: Secondary | ICD-10-CM | POA: Diagnosis not present

## 2018-05-25 DIAGNOSIS — Z4659 Encounter for fitting and adjustment of other gastrointestinal appliance and device: Secondary | ICD-10-CM | POA: Diagnosis not present

## 2018-05-25 DIAGNOSIS — K219 Gastro-esophageal reflux disease without esophagitis: Secondary | ICD-10-CM | POA: Diagnosis not present

## 2018-05-25 DIAGNOSIS — Z7982 Long term (current) use of aspirin: Secondary | ICD-10-CM | POA: Diagnosis not present

## 2018-05-25 DIAGNOSIS — Z90411 Acquired partial absence of pancreas: Secondary | ICD-10-CM | POA: Diagnosis not present

## 2018-05-25 DIAGNOSIS — Z79899 Other long term (current) drug therapy: Secondary | ICD-10-CM | POA: Diagnosis not present

## 2018-06-06 DIAGNOSIS — E440 Moderate protein-calorie malnutrition: Secondary | ICD-10-CM | POA: Insufficient documentation

## 2018-06-07 DIAGNOSIS — E440 Moderate protein-calorie malnutrition: Secondary | ICD-10-CM | POA: Diagnosis not present

## 2018-06-07 DIAGNOSIS — D490 Neoplasm of unspecified behavior of digestive system: Secondary | ICD-10-CM | POA: Diagnosis not present

## 2018-06-08 ENCOUNTER — Inpatient Hospital Stay: Payer: Medicare Other | Attending: Oncology | Admitting: Oncology

## 2018-06-08 ENCOUNTER — Other Ambulatory Visit: Payer: Self-pay

## 2018-06-08 ENCOUNTER — Encounter: Payer: Self-pay | Admitting: Oncology

## 2018-06-08 VITALS — BP 149/81 | HR 87 | Temp 96.5°F | Resp 18 | Wt 174.4 lb

## 2018-06-08 DIAGNOSIS — I10 Essential (primary) hypertension: Secondary | ICD-10-CM | POA: Diagnosis not present

## 2018-06-08 DIAGNOSIS — Z7189 Other specified counseling: Secondary | ICD-10-CM

## 2018-06-08 DIAGNOSIS — Z79899 Other long term (current) drug therapy: Secondary | ICD-10-CM | POA: Diagnosis not present

## 2018-06-08 DIAGNOSIS — R112 Nausea with vomiting, unspecified: Secondary | ICD-10-CM

## 2018-06-08 DIAGNOSIS — E039 Hypothyroidism, unspecified: Secondary | ICD-10-CM | POA: Insufficient documentation

## 2018-06-08 DIAGNOSIS — Z90411 Acquired partial absence of pancreas: Secondary | ICD-10-CM | POA: Insufficient documentation

## 2018-06-08 DIAGNOSIS — Z7982 Long term (current) use of aspirin: Secondary | ICD-10-CM | POA: Diagnosis not present

## 2018-06-08 DIAGNOSIS — K219 Gastro-esophageal reflux disease without esophagitis: Secondary | ICD-10-CM | POA: Diagnosis not present

## 2018-06-08 DIAGNOSIS — C250 Malignant neoplasm of head of pancreas: Secondary | ICD-10-CM | POA: Diagnosis not present

## 2018-06-08 DIAGNOSIS — R7989 Other specified abnormal findings of blood chemistry: Secondary | ICD-10-CM | POA: Diagnosis not present

## 2018-06-08 DIAGNOSIS — R5383 Other fatigue: Secondary | ICD-10-CM | POA: Insufficient documentation

## 2018-06-08 DIAGNOSIS — Z87891 Personal history of nicotine dependence: Secondary | ICD-10-CM | POA: Insufficient documentation

## 2018-06-08 HISTORY — DX: Malignant neoplasm of head of pancreas: C25.0

## 2018-06-08 MED ORDER — DEXAMETHASONE 4 MG PO TABS: 8.0000 mg | ORAL_TABLET | Freq: Every day | ORAL | 5 refills | Status: DC

## 2018-06-08 MED ORDER — LIDOCAINE-PRILOCAINE 2.5-2.5 % EX CREA: TOPICAL_CREAM | CUTANEOUS | 3 refills | Status: DC

## 2018-06-08 MED ORDER — PROCHLORPERAZINE MALEATE 10 MG PO TABS: 10.0000 mg | ORAL_TABLET | Freq: Four times a day (QID) | ORAL | 1 refills | Status: DC | PRN

## 2018-06-08 NOTE — Progress Notes (Signed)
START ON PATHWAY REGIMEN - Pancreatic     A cycle is every 14 days:     Oxaliplatin      Leucovorin      Irinotecan      5-Fluorouracil   **Always confirm dose/schedule in your pharmacy ordering system**  Patient Characteristics: Adenocarcinoma, Resectable, Adjuvant, Negative Margins (Includes Positive Lymph Nodes) Histology: Adenocarcinoma Current evidence of distant metastases<= No AJCC T Category: T2 AJCC N Category: N1 AJCC M Category: M0 AJCC 8 Stage Grouping: IIB Treatment Setting: Adjuvant Intent of Therapy: Curative Intent, Discussed with Patient

## 2018-06-08 NOTE — Progress Notes (Signed)
Patient here for follow up. She states appetite is returning.

## 2018-06-08 NOTE — Progress Notes (Signed)
Hematology/Oncology follow-up note [facility] [phone] [phone]   Patient Care Team: Lavera Guise, MD as PCP - General (Internal Medicine) Clent Jacks, RN as Registered Nurse  REFERRING PROVIDER: Evie Lacks CHIEF COMPLAINTS/REASON FOR VISIT:  Follow-up for management of pancreatic cancer.  HISTORY OF PRESENTING ILLNESS:  Lynn Taylor is a  72 y.o.  female with PMH listed below who was referred to me for evaluation of evaluation of elevated ferritin. Patient recently had lab work-up done on 04/09/2018 which showed ferritin level 1877, iron 97, TIBC 340, iron saturation 29, 04/11/2018 CBC showed hemoglobin 13.2, MCV 81, WBC 7.6, platelet counts 246,000. Patient was referred to hematology for further evaluation of elevated ferritin.  Patient reports recent hospitalization from 04/11/2018 to 04/12/2018.  She presented emergency room with complaints of chest pain.  Ruled out ACS.  Patient also had a recent UTI infection and has been on Macrobid. Denies any family history of hereditary hemochromatosis. Denies shortness of breath Denies joint pain. Reports feeling well today.  INTERVAL HISTORY Lynn Taylor is a 72 y.o. female who has above history reviewed by me today presents for follow up visit for management of newly diagnosed pancreatic cancer. Patient was last seen by me on 04/14/2018 for elevated ferritin. Ultrasound of liver was obtained which showed CBD and intrahepatic biliary dilatation Patient wa she is s advised to go to emergency room for further evaluation. Also had hyperbilirubinemia.  Patient was complaining about being jaundiced, pruritus all over. MR abdomen MRCP with and without contrast showed market biliary duct dilatation, secondary to obstruction in the region of pancreatic head.  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 No abdominal adenopathy, liver  metastasis or cross vascular involvement.  patient was evaluated by gastroenterology and status post ERCP with stenting. CA 19.9 1173 CEA 3.9 Denies Patient was accompanied by daughter today.  She reports that jaundice and pruritus have improved.  Any abdominal pain.  Reports feeling very fatigued. Decreased appetite.  Feels mild nausea when she eats.  Reports not drinking much fluid.   INTERVAL HISTORY Lynn Taylor is a 72 y.o. female who has above history reviewed by me today presents for follow up visit for management of pancreatic cancer.   During the interval, patient was referred to [facility] and s/p whipple resection.Her postop course was c/b Type A pancreatic leak and c.diff.  Pathology showed A. Hepatic artery lymph node, excision: One lymph node, negative for malignancy (0/1). B. Biliary stent, removal: Medical device, consistent with stent, gross examination only. C. 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 All margins are negative (closest margin=uncinate, 0.5 mm) Metastatic adenocarcinoma in one of thirty-two lymph nodes (1/32).  Portion of stomach and duodenum with no specific pathologic diagnosis.   Grade, 3 poorly differentiated.  Pathologic pT2 pN1  She is revering from surgery, feel better. Appetite is better.   Review of Systems  Constitutional: Positive for fatigue. Negative for appetite change, chills and fever.  HENT:   Negative for hearing loss and voice change.   Eyes: Negative for eye problems.  Respiratory: Negative for chest tightness and cough.   Cardiovascular: Negative for chest pain.  Gastrointestinal: Negative for abdominal distention, abdominal pain, blood in stool and nausea.  Endocrine: Negative for hot flashes.  Genitourinary: Negative for difficulty urinating and frequency.   Musculoskeletal: Negative for  arthralgias.  Skin: Negative for itching and  rash.  Neurological: Negative for extremity weakness.  Hematological: Negative for adenopathy.  Psychiatric/Behavioral: Negative for confusion.     MEDICAL HISTORY:  Past Medical History:  Diagnosis Date  . Cancer ([place])   . GERD (gastroesophageal reflux disease)   . Hypertension   . Hypothyroidism     SURGICAL HISTORY: Past Surgical History:  Procedure Laterality Date  . CHOLECYSTECTOMY    . COLONOSCOPY N/A 12/02/2016   Procedure: COLONOSCOPY;  Surgeon: Lollie Sails, MD;  Location: [facility] ENDOSCOPY;  Service: Endoscopy;  Laterality: N/A;  . ERCP N/A 04/30/2018   Procedure: ENDOSCOPIC RETROGRADE CHOLANGIOPANCREATOGRAPHY (ERCP);  Surgeon: Lucilla Lame, MD;  Location: [facility] ENDOSCOPY;  Service: Endoscopy;  Laterality: N/A;  . ERCP N/A 05/05/2018   Procedure: ENDOSCOPIC RETROGRADE CHOLANGIOPANCREATOGRAPHY (ERCP);  Surgeon: Lucilla Lame, MD;  Location: [facility] ENDOSCOPY;  Service: Endoscopy;  Laterality: N/A;    SOCIAL HISTORY: Social History   Socioeconomic History  . Marital status: Married    Spouse name: Not on file  . Number of children: Not on file  . Years of education: Not on file  . Highest education level: Not on file  Occupational History  . Not on file  Social Needs  . Financial resource strain: Not on file  . Food insecurity:    Worry: Not on file    Inability: Not on file  . Transportation needs:    Medical: Not on file    Non-medical: Not on file  Tobacco Use  . Smoking status: Former Smoker    Last attempt to quit: 04/14/1988    Years since quitting: 30.1  . Smokeless tobacco: Never Used  Substance and Sexual Activity  . Alcohol use: Yes    Comment: social drinker  . Drug use: No  . Sexual activity: Not on file  Lifestyle  . Physical activity:    Days per week: Not on file    Minutes per session: Not on file  . Stress: Not on file  Relationships  . Social connections:    Talks on phone: Not on file      Gets together: Not on file    Attends religious service: Not on file    Active member of club or organization: Not on file    Attends meetings of clubs or organizations: Not on file    Relationship status: Not on file  . Intimate partner violence:    Fear of current or ex partner: Not on file    Emotionally abused: Not on file    Physically abused: Not on file    Forced sexual activity: Not on file  Other Topics Concern  . Not on file  Social History Narrative  . Not on file    FAMILY HISTORY: Family History  Problem Relation Age of Onset  . Diabetes Mellitus I Other   . Alcoholism Other   . Hypertension Other   . Hyperlipidemia Other   . Coronary artery disease Other   . Stroke Other   . Osteoarthritis Other   . Migraines Other   . Heart Problems Mother   . Stroke Sister   . CAD Brother   . Stroke Brother   . Breast cancer Neg Hx     ALLERGIES:  has No Known Allergies.  MEDICATIONS:  Current Outpatient Medications  Medication Sig Dispense Refill  . acetaminophen (TYLENOL) 325 MG tablet Take 2 tablets by mouth every 6 (six) hours.    Marland Kitchen aspirin EC 81 MG tablet Take 81 mg by mouth daily.    Marland Kitchen  cholecalciferol (VITAMIN D3) 25 MCG (1000 UT) tablet Take 1,000 Units by mouth daily.    Marland Kitchen enoxaparin (LOVENOX) 40 MG/0.4ML injection Inject 40 mg into the skin daily.    Marland Kitchen levothyroxine (SYNTHROID, LEVOTHROID) 100 MCG tablet Take 1 tablet (100 mcg total) by mouth daily before breakfast. 90 tablet 4  . vancomycin (VANCOCIN) 125 MG capsule Take 125 mg by mouth 4 (four) times daily.    . cholestyramine (QUESTRAN) 4 g packet Take 1 packet (4 g total) by mouth 3 (three) times daily. (Patient not taking: Reported on 06/08/2018) 90 each 0  . hydrOXYzine (ATARAX/VISTARIL) 25 MG tablet Take 0.5 tablets (12.5 mg total) by mouth at bedtime as needed. (Patient not taking: Reported on 06/08/2018) 15 tablet 0  . olmesartan-hydrochlorothiazide (BENICAR HCT) 20-12.5 MG tablet Take 1 tablet by  mouth daily. (Patient not taking: Reported on 05/10/2018) 30 tablet 5  . ondansetron (ZOFRAN) 4 MG tablet Take 1 tablet (4 mg total) by mouth every 6 (six) hours as needed for nausea or vomiting. (Patient not taking: Reported on 06/08/2018) 30 tablet 3   No current facility-administered medications for this visit.      PHYSICAL EXAMINATION: ECOG PERFORMANCE STATUS: 1 - Symptomatic but completely ambulatory Vitals:   06/08/18 0952  BP: (!) 149/81  Pulse: 87  Resp: 18  Temp: (!) 96.5 F (35.8 C)   Filed Weights   06/08/18 0952  Weight: 174 lb 6.4 oz (79.1 kg)    Physical Exam Constitutional:      General: She is not in acute distress. HENT:     Head: Normocephalic and atraumatic.  Eyes:     General: No scleral icterus.    Pupils: Pupils are equal, round, and reactive to light.  Neck:     Musculoskeletal: Normal range of motion and neck supple.  Cardiovascular:     Rate and Rhythm: Normal rate and regular rhythm.     Heart sounds: Normal heart sounds.  Pulmonary:     Effort: Pulmonary effort is normal. No respiratory distress.     Breath sounds: No wheezing.  Abdominal:     General: Bowel sounds are normal. There is no distension.     Palpations: Abdomen is soft.     Tenderness: There is no abdominal tenderness.     Comments: JP drain tube +  Musculoskeletal: Normal range of motion.        General: No deformity.  Skin:    General: Skin is warm and dry.     Findings: No erythema or rash.  Neurological:     Mental Status: She is alert and oriented to person, place, and time.     Cranial Nerves: No cranial nerve deficit.     Coordination: Coordination normal.      LABORATORY DATA:  I have reviewed the data as listed Lab Results  Component Value Date   WBC 10.2 05/01/2018   HGB 10.7 (L) 05/01/2018   HCT 29.4 (L) 05/01/2018   MCV 76.2 (L) 05/01/2018   PLT 258 05/01/2018   Recent Labs    04/21/18 1046  04/29/18 0612  05/01/18 0404 05/02/18 0751  05/04/18 0921 05/10/18 1429  NA 137   < > 136   < > 133* 133* 128*  --   K 3.8   < > 3.3*   < > 3.7 3.6 4.0  --   CL 100   < > 108   < > 102 101 97*  --   CO2 22   < >  23   < > 25 24 24   --   GLUCOSE 95   < > 101*   < > 101* 101* 114*  --   BUN 23   < > 9   < > 10 8 9   --   CREATININE 1.11*   < > 0.49   < > 0.69 0.74 0.74  --   CALCIUM 10.6*   < > 9.2   < > 9.1 9.4 9.7  --   GFRNONAA 50*   < > >60   < > >60 >60 >60  --   GFRAA 57*   < > >60   < > >60 >60 >60  --   PROT 7.7   < >  --    < > 6.7 6.7 7.5 7.3  ALBUMIN 3.9   < >  --    < > 2.5* 2.5* 2.8* 3.8  AST 61*   < >  --    < > 50* 52* 60* 94*  ALT 121*   < >  --    < > 41 40 41 80*  ALKPHOS 275*   < >  --    < > 223* 218* 236* 200*  BILITOT 11.7*   < > 13.7*   < > 14.3* 13.8* 11.9* 6.2*  BILIDIR 8.84*  --  8.2*  --   --   --   --  4.58*   < > = values in this interval not displayed.   Iron/TIBC/Ferritin/ %Sat    Component Value Date/Time   IRON 77 05/04/2018 0831   IRON 97 04/09/2018 1440   TIBC 320 05/04/2018 0831   TIBC 340 04/09/2018 1440   FERRITIN 496 (H) 05/04/2018 0831   FERRITIN 1,877 (H) 04/09/2018 1440   IRONPCTSAT 24 05/04/2018 0831   IRONPCTSAT 29 04/09/2018 1440        ASSESSMENT & PLAN:  1. Malignant neoplasm of head of pancreas ([place])   2. Goals of care, counseling/discussion    pT2 pN1 M0, Stage IIB pancreatic cancer S/p whipple resection.  2 weeks post op.  Pathology was reviewed and discussed with patient and her husband.  recommend six months of adjuvant chemotherapy within eight weeks of surgery Obtain restaging with CT scans in 2 weeks and check post operation CA 19-9.  Plan adjuvant FOLFIRINOX Q14d for 6 months. Patient will be re-evaluated in 2 weeks to see if she recovers well to be fit enough for  FOLFIRINOX   I explained to the patient the risks and benefits of chemotherapy including all but not limited to hair loss, mouth sore, nausea, vomiting, low blood counts, diarrhea,  bleeding, and  risk of life threatening infection and even death, secondary malignancy etc.  Risk of neuropathy is associated with Oxilaplatin.Goal of care is discussed. Adjuvant chemotherapy is with curative intent.  Patient voices understanding and willing to proceed chemotherapy.   Discussed about genetic testing. Will refer to genetic counselor.   # We spent sufficient time to discuss many aspect of care, questions were answered to patient's satisfaction.  Orders Placed This Encounter  Procedures  . Ambulatory referral to Genetics    Referral Priority:   Routine    Referral Type:   Consultation    Referral Reason:   Specialty Services Required    Number of Visits Requested:   1    All questions were answered. The patient knows to call the clinic with any problems questions or concerns.  Return of visit: 2  weeks.   Earlie Server, MD, PhD Hematology Oncology [facility] Pager- [phone] 06/08/2018

## 2018-06-11 ENCOUNTER — Other Ambulatory Visit: Payer: Medicare Other

## 2018-06-14 ENCOUNTER — Ambulatory Visit: Payer: Medicare Other | Admitting: Family Medicine

## 2018-06-14 DIAGNOSIS — C250 Malignant neoplasm of head of pancreas: Secondary | ICD-10-CM | POA: Diagnosis not present

## 2018-06-16 ENCOUNTER — Encounter: Payer: Self-pay | Admitting: Family Medicine

## 2018-06-16 ENCOUNTER — Ambulatory Visit (INDEPENDENT_AMBULATORY_CARE_PROVIDER_SITE_OTHER): Payer: Medicare Other | Admitting: Family Medicine

## 2018-06-16 VITALS — BP 142/80 | HR 93 | Temp 98.0°F | Ht 62.0 in | Wt 167.6 lb

## 2018-06-16 DIAGNOSIS — C250 Malignant neoplasm of head of pancreas: Secondary | ICD-10-CM | POA: Diagnosis not present

## 2018-06-16 DIAGNOSIS — I10 Essential (primary) hypertension: Secondary | ICD-10-CM | POA: Diagnosis not present

## 2018-06-16 DIAGNOSIS — E039 Hypothyroidism, unspecified: Secondary | ICD-10-CM

## 2018-06-16 NOTE — Patient Instructions (Signed)
We will keep eye on your BP, now it is stable so continue to STAY OFF of BP medication

## 2018-06-16 NOTE — Patient Instructions (Signed)
Oxaliplatin Injection What is this medicine? OXALIPLATIN (ox AL i PLA tin) is a chemotherapy drug. It targets fast dividing cells, like cancer cells, and causes these cells to die. This medicine is used to treat cancers of the colon and rectum, and many other cancers. This medicine may be used for other purposes; ask your health care provider or pharmacist if you have questions. COMMON BRAND NAME(S): Eloxatin What should I tell my health care provider before I take this medicine? They need to know if you have any of these conditions: -kidney disease -an unusual or allergic reaction to oxaliplatin, other chemotherapy, other medicines, foods, dyes, or preservatives -pregnant or trying to get pregnant -breast-feeding How should I use this medicine? This drug is given as an infusion into a vein. It is administered in a hospital or clinic by a specially trained health care professional. Talk to your pediatrician regarding the use of this medicine in children. Special care may be needed. Overdosage: If you think you have taken too much of this medicine contact a poison control center or emergency room at once. NOTE: This medicine is only for you. Do not share this medicine with others. What if I miss a dose? It is important not to miss a dose. Call your doctor or health care professional if you are unable to keep an appointment. What may interact with this medicine? -medicines to increase blood counts like filgrastim, pegfilgrastim, sargramostim -probenecid -some antibiotics like amikacin, gentamicin, neomycin, polymyxin B, streptomycin, tobramycin -zalcitabine Talk to your doctor or health care professional before taking any of these medicines: -acetaminophen -aspirin -ibuprofen -ketoprofen -naproxen This list may not describe all possible interactions. Give your health care provider a list of all the medicines, herbs, non-prescription drugs, or dietary supplements you use. Also tell them if  you smoke, drink alcohol, or use illegal drugs. Some items may interact with your medicine. What should I watch for while using this medicine? Your condition will be monitored carefully while you are receiving this medicine. You will need important blood work done while you are taking this medicine. This medicine can make you more sensitive to cold. Do not drink cold drinks or use ice. Cover exposed skin before coming in contact with cold temperatures or cold objects. When out in cold weather wear warm clothing and cover your mouth and nose to warm the air that goes into your lungs. Tell your doctor if you get sensitive to the cold. This drug may make you feel generally unwell. This is not uncommon, as chemotherapy can affect healthy cells as well as cancer cells. Report any side effects. Continue your course of treatment even though you feel ill unless your doctor tells you to stop. In some cases, you may be given additional medicines to help with side effects. Follow all directions for their use. Call your doctor or health care professional for advice if you get a fever, chills or sore throat, or other symptoms of a cold or flu. Do not treat yourself. This drug decreases your body's ability to fight infections. Try to avoid being around people who are sick. This medicine may increase your risk to bruise or bleed. Call your doctor or health care professional if you notice any unusual bleeding. Be careful brushing and flossing your teeth or using a toothpick because you may get an infection or bleed more easily. If you have any dental work done, tell your dentist you are receiving this medicine. Avoid taking products that contain aspirin, acetaminophen, ibuprofen, naproxen,   or ketoprofen unless instructed by your doctor. These medicines may hide a fever. Do not become pregnant while taking this medicine. Women should inform their doctor if they wish to become pregnant or think they might be pregnant. There  is a potential for serious side effects to an unborn child. Talk to your health care professional or pharmacist for more information. Do not breast-feed an infant while taking this medicine. Call your doctor or health care professional if you get diarrhea. Do not treat yourself. What side effects may I notice from receiving this medicine? Side effects that you should report to your doctor or health care professional as soon as possible: -allergic reactions like skin rash, itching or hives, swelling of the face, lips, or tongue -low blood counts - This drug may decrease the number of white blood cells, red blood cells and platelets. You may be at increased risk for infections and bleeding. -signs of infection - fever or chills, cough, sore throat, pain or difficulty passing urine -signs of decreased platelets or bleeding - bruising, pinpoint red spots on the skin, black, tarry stools, nosebleeds -signs of decreased red blood cells - unusually weak or tired, fainting spells, lightheadedness -breathing problems -chest pain, pressure -cough -diarrhea -jaw tightness -mouth sores -nausea and vomiting -pain, swelling, redness or irritation at the injection site -pain, tingling, numbness in the hands or feet -problems with balance, talking, walking -redness, blistering, peeling or loosening of the skin, including inside the mouth -trouble passing urine or change in the amount of urine Side effects that usually do not require medical attention (report to your doctor or health care professional if they continue or are bothersome): -changes in vision -constipation -hair loss -loss of appetite -metallic taste in the mouth or changes in taste -stomach pain This list may not describe all possible side effects. Call your doctor for medical advice about side effects. You may report side effects to FDA at 1-800-FDA-1088. Where should I keep my medicine? This drug is given in a hospital or clinic and  will not be stored at home. NOTE: This sheet is a summary. It may not cover all possible information. If you have questions about this medicine, talk to your doctor, pharmacist, or health care provider.  2019 [employer] Standard (2007-12-21 17:22:47) Irinotecan injection What is this medicine? IRINOTECAN (ir in oh TEE kan ) is a chemotherapy drug. It is used to treat colon and rectal cancer. This medicine may be used for other purposes; ask your health care provider or pharmacist if you have questions. COMMON BRAND NAME(S): Camptosar What should I tell my health care provider before I take this medicine? They need to know if you have any of these conditions: -dehydration -diarrhea -infection (especially a virus infection such as chickenpox, cold sores, or herpes) -liver disease -low blood counts, like low white cell, platelet, or red cell counts -low levels of calcium, magnesium, or potassium in the blood -recent or ongoing radiation therapy -an unusual or allergic reaction to irinotecan, other medicines, foods, dyes, or preservatives -pregnant or trying to get pregnant -breast-feeding How should I use this medicine? This drug is given as an infusion into a vein. It is administered in a hospital or clinic by a specially trained health care professional. Talk to your pediatrician regarding the use of this medicine in children. Special care may be needed. Overdosage: If you think you have taken too much of this medicine contact a poison control center or emergency room at once. NOTE: This medicine is  only for you. Do not share this medicine with others. What if I miss a dose? It is important not to miss your dose. Call your doctor or health care professional if you are unable to keep an appointment. What may interact with this medicine? This medicine may interact with the following medications: -antiviral medicines for HIV or AIDS -certain antibiotics like rifampin or  rifabutin -certain medicines for fungal infections like itraconazole, ketoconazole, posaconazole, and voriconazole -certain medicines for seizures like carbamazepine, phenobarbital, phenotoin -clarithromycin -gemfibrozil -nefazodone -St. John's Wort This list may not describe all possible interactions. Give your health care provider a list of all the medicines, herbs, non-prescription drugs, or dietary supplements you use. Also tell them if you smoke, drink alcohol, or use illegal drugs. Some items may interact with your medicine. What should I watch for while using this medicine? Your condition will be monitored carefully while you are receiving this medicine. You will need important blood work done while you are taking this medicine. This drug may make you feel generally unwell. This is not uncommon, as chemotherapy can affect healthy cells as well as cancer cells. Report any side effects. Continue your course of treatment even though you feel ill unless your doctor tells you to stop. In some cases, you may be given additional medicines to help with side effects. Follow all directions for their use. You may get drowsy or dizzy. Do not drive, use machinery, or do anything that needs mental alertness until you know how this medicine affects you. Do not stand or sit up quickly, especially if you are an older patient. This reduces the risk of dizzy or fainting spells. Call your doctor or health care professional for advice if you get a fever, chills or sore throat, or other symptoms of a cold or flu. Do not treat yourself. This drug decreases your body's ability to fight infections. Try to avoid being around people who are sick. This medicine may increase your risk to bruise or bleed. Call your doctor or health care professional if you notice any unusual bleeding. Be careful brushing and flossing your teeth or using a toothpick because you may get an infection or bleed more easily. If you have any  dental work done, tell your dentist you are receiving this medicine. Avoid taking products that contain aspirin, acetaminophen, ibuprofen, naproxen, or ketoprofen unless instructed by your doctor. These medicines may hide a fever. Do not become pregnant while taking this medicine. Women should inform their doctor if they wish to become pregnant or think they might be pregnant. There is a potential for serious side effects to an unborn child. Talk to your health care professional or pharmacist for more information. Do not breast-feed an infant while taking this medicine. What side effects may I notice from receiving this medicine? Side effects that you should report to your doctor or health care professional as soon as possible: -allergic reactions like skin rash, itching or hives, swelling of the face, lips, or tongue -chest pain -diarrhea -flushing, runny nose, sweating during infusion -low blood counts - this medicine may decrease the number of white blood cells, red blood cells and platelets. You may be at increased risk for infections and bleeding. -nausea, vomiting -pain, swelling, warmth in the leg -signs of decreased platelets or bleeding - bruising, pinpoint red spots on the skin, black, tarry stools, blood in the urine -signs of infection - fever or chills, cough, sore throat, pain or difficulty passing urine -signs of decreased red blood  cells - unusually weak or tired, fainting spells, lightheadedness Side effects that usually do not require medical attention (report to your doctor or health care professional if they continue or are bothersome): -constipation -hair loss -headache -loss of appetite -mouth sores -stomach pain This list may not describe all possible side effects. Call your doctor for medical advice about side effects. You may report side effects to FDA at 1-800-FDA-1088. Where should I keep my medicine? This drug is given in a hospital or clinic and will not be stored  at home. NOTE: This sheet is a summary. It may not cover all possible information. If you have questions about this medicine, talk to your doctor, pharmacist, or health care provider.  2019 [employer] Standard (2017-08-11 15:02:58) Fluorouracil, 5-FU injection What is this medicine? FLUOROURACIL, 5-FU (flure oh YOOR a sil) is a chemotherapy drug. It slows the growth of cancer cells. This medicine is used to treat many types of cancer like breast cancer, colon or rectal cancer, pancreatic cancer, and stomach cancer. This medicine may be used for other purposes; ask your health care provider or pharmacist if you have questions. COMMON BRAND NAME(S): Adrucil What should I tell my health care provider before I take this medicine? They need to know if you have any of these conditions: -blood disorders -dihydropyrimidine dehydrogenase (DPD) deficiency -infection (especially a virus infection such as chickenpox, cold sores, or herpes) -kidney disease -liver disease -malnourished, poor nutrition -recent or ongoing radiation therapy -an unusual or allergic reaction to fluorouracil, other chemotherapy, other medicines, foods, dyes, or preservatives -pregnant or trying to get pregnant -breast-feeding How should I use this medicine? This drug is given as an infusion or injection into a vein. It is administered in a hospital or clinic by a specially trained health care professional. Talk to your pediatrician regarding the use of this medicine in children. Special care may be needed. Overdosage: If you think you have taken too much of this medicine contact a poison control center or emergency room at once. NOTE: This medicine is only for you. Do not share this medicine with others. What if I miss a dose? It is important not to miss your dose. Call your doctor or health care professional if you are unable to keep an appointment. What may interact with this  medicine? -allopurinol -cimetidine -dapsone -digoxin -hydroxyurea -leucovorin -levamisole -medicines for seizures like ethotoin, fosphenytoin, phenytoin -medicines to increase blood counts like filgrastim, pegfilgrastim, sargramostim -medicines that treat or prevent blood clots like warfarin, enoxaparin, and dalteparin -methotrexate -metronidazole -pyrimethamine -some other chemotherapy drugs like busulfan, cisplatin, estramustine, vinblastine -trimethoprim -trimetrexate -vaccines Talk to your doctor or health care professional before taking any of these medicines: -acetaminophen -aspirin -ibuprofen -ketoprofen -naproxen This list may not describe all possible interactions. Give your health care provider a list of all the medicines, herbs, non-prescription drugs, or dietary supplements you use. Also tell them if you smoke, drink alcohol, or use illegal drugs. Some items may interact with your medicine. What should I watch for while using this medicine? Visit your doctor for checks on your progress. This drug may make you feel generally unwell. This is not uncommon, as chemotherapy can affect healthy cells as well as cancer cells. Report any side effects. Continue your course of treatment even though you feel ill unless your doctor tells you to stop. In some cases, you may be given additional medicines to help with side effects. Follow all directions for their use. Call your doctor or health care professional for  advice if you get a fever, chills or sore throat, or other symptoms of a cold or flu. Do not treat yourself. This drug decreases your body's ability to fight infections. Try to avoid being around people who are sick. This medicine may increase your risk to bruise or bleed. Call your doctor or health care professional if you notice any unusual bleeding. Be careful brushing and flossing your teeth or using a toothpick because you may get an infection or bleed more easily. If you  have any dental work done, tell your dentist you are receiving this medicine. Avoid taking products that contain aspirin, acetaminophen, ibuprofen, naproxen, or ketoprofen unless instructed by your doctor. These medicines may hide a fever. Do not become pregnant while taking this medicine. Women should inform their doctor if they wish to become pregnant or think they might be pregnant. There is a potential for serious side effects to an unborn child. Talk to your health care professional or pharmacist for more information. Do not breast-feed an infant while taking this medicine. Men should inform their doctor if they wish to father a child. This medicine may lower sperm counts. Do not treat diarrhea with over the counter products. Contact your doctor if you have diarrhea that lasts more than 2 days or if it is severe and watery. This medicine can make you more sensitive to the sun. Keep out of the sun. If you cannot avoid being in the sun, wear protective clothing and use sunscreen. Do not use sun lamps or tanning beds/booths. What side effects may I notice from receiving this medicine? Side effects that you should report to your doctor or health care professional as soon as possible: -allergic reactions like skin rash, itching or hives, swelling of the face, lips, or tongue -low blood counts - this medicine may decrease the number of white blood cells, red blood cells and platelets. You may be at increased risk for infections and bleeding. -signs of infection - fever or chills, cough, sore throat, pain or difficulty passing urine -signs of decreased platelets or bleeding - bruising, pinpoint red spots on the skin, black, tarry stools, blood in the urine -signs of decreased red blood cells - unusually weak or tired, fainting spells, lightheadedness -breathing problems -changes in vision -chest pain -mouth sores -nausea and vomiting -pain, swelling, redness at site where injected -pain, tingling,  numbness in the hands or feet -redness, swelling, or sores on hands or feet -stomach pain -unusual bleeding Side effects that usually do not require medical attention (report to your doctor or health care professional if they continue or are bothersome): -changes in finger or toe nails -diarrhea -dry or itchy skin -hair loss -headache -loss of appetite -sensitivity of eyes to the light -stomach upset -unusually teary eyes This list may not describe all possible side effects. Call your doctor for medical advice about side effects. You may report side effects to FDA at 1-800-FDA-1088. Where should I keep my medicine? This drug is given in a hospital or clinic and will not be stored at home. NOTE: This sheet is a summary. It may not cover all possible information. If you have questions about this medicine, talk to your doctor, pharmacist, or health care provider.  2019 [employer] Standard (2007-09-29 13:53:16) Leucovorin injection What is this medicine? LEUCOVORIN (loo koe VOR in) is used to prevent or treat the harmful effects of some medicines. This medicine is used to treat anemia caused by a low amount of folic acid in the body.  It is also used with 5-fluorouracil (5-FU) to treat colon cancer. This medicine may be used for other purposes; ask your health care provider or pharmacist if you have questions. What should I tell my health care provider before I take this medicine? They need to know if you have any of these conditions: -anemia from low levels of vitamin B-12 in the blood -an unusual or allergic reaction to leucovorin, folic acid, other medicines, foods, dyes, or preservatives -pregnant or trying to get pregnant -breast-feeding How should I use this medicine? This medicine is for injection into a muscle or into a vein. It is given by a health care professional in a hospital or clinic setting. Talk to your pediatrician regarding the use of this medicine in children.  Special care may be needed. Overdosage: If you think you have taken too much of this medicine contact a poison control center or emergency room at once. NOTE: This medicine is only for you. Do not share this medicine with others. What if I miss a dose? This does not apply. What may interact with this medicine? -capecitabine -fluorouracil -phenobarbital -phenytoin -primidone -trimethoprim-sulfamethoxazole This list may not describe all possible interactions. Give your health care provider a list of all the medicines, herbs, non-prescription drugs, or dietary supplements you use. Also tell them if you smoke, drink alcohol, or use illegal drugs. Some items may interact with your medicine. What should I watch for while using this medicine? Your condition will be monitored carefully while you are receiving this medicine. This medicine may increase the side effects of 5-fluorouracil, 5-FU. Tell your doctor or health care professional if you have diarrhea or mouth sores that do not get better or that get worse. What side effects may I notice from receiving this medicine? Side effects that you should report to your doctor or health care professional as soon as possible: -allergic reactions like skin rash, itching or hives, swelling of the face, lips, or tongue -breathing problems -fever, infection -mouth sores -unusual bleeding or bruising -unusually weak or tired Side effects that usually do not require medical attention (report to your doctor or health care professional if they continue or are bothersome): -constipation or diarrhea -loss of appetite -nausea, vomiting This list may not describe all possible side effects. Call your doctor for medical advice about side effects. You may report side effects to FDA at 1-800-FDA-1088. Where should I keep my medicine? This drug is given in a hospital or clinic and will not be stored at home. NOTE: This sheet is a summary. It may not cover all  possible information. If you have questions about this medicine, talk to your doctor, pharmacist, or health care provider.  2019 [employer] Standard (2007-11-30 16:50:29)

## 2018-06-16 NOTE — Progress Notes (Signed)
Subjective:    Patient ID: Lynn Taylor, female    DOB: 1946-03-15, 73 y.o.   MRN: [id number]  HPI   Patient presents to clinic to establish with PCP.  Patient has a history of hypothyroidism and hypertension, but has not been to a PCP in a few years due to dealing with pancreatic cancer.  Patient did have a Whipple procedure at the end of December, everything went well.  She has follow-up with oncology and surgeon next week for removal of drain and then discussion will begin about her chemotherapy treatments.  Currently she is feeling well.  Appetite is normal.    Blood pressures have been stable, she is currently not taking her olmesartan/hydrochlorothiazide due to having low BP readings & being advised to hold BP meds.   Patient is tolerating levothyroxine 100 mcg dose well.   Past Medical History:  Diagnosis Date  . Cancer ([place])   . GERD (gastroesophageal reflux disease)   . Hypertension   . Hypothyroidism   . Malignant neoplasm of head of pancreas ([place]) 06/08/2018   Patient Active Problem List   Diagnosis Date Noted  . Malignant neoplasm of head of pancreas ([place]) 06/08/2018  . Goals of care, counseling/discussion 05/08/2018  . Fitting and adjustment of gastrointestinal appliance and device   . Hyponatremia 05/04/2018  . Jaundice   . Pancreas neoplasm   . Biliary tract obstruction   . Hyperbilirubinemia 04/28/2018  . Chest pain 04/11/2018  . GERD (gastroesophageal reflux disease) 04/11/2018  . HTN (hypertension), benign 05/20/2017  . Acquired hypothyroidism 05/20/2017  . Vitamin D deficiency, unspecified 05/20/2017   Social History   Tobacco Use  . Smoking status: Former Smoker    Last attempt to quit: 04/14/1988    Years since quitting: 30.1  . Smokeless tobacco: Never Used  Substance Use Topics  . Alcohol use: Yes    Comment: social drinker   Past Surgical History:  Procedure Laterality Date  . CHOLECYSTECTOMY    . COLONOSCOPY N/A 12/02/2016   Procedure:  COLONOSCOPY;  Surgeon: Lollie Sails, MD;  Location: [facility] ENDOSCOPY;  Service: Endoscopy;  Laterality: N/A;  . ERCP N/A 04/30/2018   Procedure: ENDOSCOPIC RETROGRADE CHOLANGIOPANCREATOGRAPHY (ERCP);  Surgeon: Lucilla Lame, MD;  Location: [facility] ENDOSCOPY;  Service: Endoscopy;  Laterality: N/A;  . ERCP N/A 05/05/2018   Procedure: ENDOSCOPIC RETROGRADE CHOLANGIOPANCREATOGRAPHY (ERCP);  Surgeon: Lucilla Lame, MD;  Location: [facility] ENDOSCOPY;  Service: Endoscopy;  Laterality: N/A;   Family History  Problem Relation Age of Onset  . Diabetes Mellitus I Other   . Alcoholism Other   . Hypertension Other   . Hyperlipidemia Other   . Coronary artery disease Other   . Stroke Other   . Osteoarthritis Other   . Migraines Other   . Heart Problems Mother   . Stroke Sister   . CAD Brother   . Stroke Brother   . Breast cancer Neg Hx    Review of Systems   Constitutional: Negative for chills, fatigue and fever.  HENT: Negative for congestion, ear pain, sinus pain and sore throat.   Eyes: Negative.   Respiratory: Negative for cough, shortness of breath and wheezing.   Cardiovascular: Negative for chest pain, palpitations and leg swelling.  Gastrointestinal: Negative for abdominal pain, diarrhea, nausea and vomiting.  Genitourinary: Negative for dysuria, frequency and urgency.  Musculoskeletal: Negative for arthralgias and myalgias.  Skin: Negative for color change, pallor and rash.  Neurological: Negative for syncope, light-headedness and headaches.  Psychiatric/Behavioral: The patient  is not nervous/anxious.       Objective:   Physical Exam  Constitutional: She appears well-developed and well-nourished. No distress.  HENT:  Head: Normocephalic and atraumatic.  Eyes: Pupils are equal, round, and reactive to light. EOM are normal. No scleral icterus.  Neck: Normal range of motion. Neck supple. No tracheal deviation present.  Cardiovascular: Normal rate, regular rhythm and normal heart  sounds.  Pulmonary/Chest: Effort normal and breath sounds normal. No respiratory distress. She has no wheezes. She has no rales.  Abdominal: Soft. Bowel sounds are normal. There is no tenderness. Drain from surgery in place.  Neurological: She is alert and oriented to person, place, and time.  Gait normal  Skin: Skin is warm and dry. No pallor.  Psychiatric: She has a normal mood and affect. Her behavior is normal. Thought content normal.   Nursing note and vitals reviewed.    Vitals:   06/16/18 0921  BP: (!) 142/80  Pulse: 93  Temp: 98 F (36.7 C)  SpO2: 94%   Assessment & Plan:   Acquired hypothyroidism-we will get new thyroid panel in clinic today.  Based on thyroid panel results, we will determine if any levothyroxine dose adjustments are needed.  Hypertension-currently blood pressure is stable in office.  I have advised patient to continue on holding her olmesartan/hydrochlorothiazide and we will keep a close eye on blood pressures.  If her blood pressure continues to remain at this level, she will not need to take BP medications.  If blood pressure does start to slowly increase, we can add BP medication back into her regimen.  Malignant neoplasm of head of pancreas-patient will continue her regular follow-up with oncology as planned.  Patient has upcoming blood work scheduled for next week via her oncologist.  Patient will follow-up here in approximately 3 months for management of hypothyroidism and hypertension.  Advised she can return to clinic sooner if any issues arise.

## 2018-06-17 LAB — THYROID PANEL WITH TSH
Free Thyroxine Index: 2.7 (ref 1.2–4.9)
T3 Uptake Ratio: 26 % (ref 24–39)
T4, Total: 10.3 ug/dL (ref 4.5–12.0)
TSH: 12.36 u[IU]/mL — ABNORMAL HIGH (ref 0.450–4.500)

## 2018-06-18 ENCOUNTER — Inpatient Hospital Stay: Payer: Medicare Other | Attending: Oncology

## 2018-06-18 DIAGNOSIS — I10 Essential (primary) hypertension: Secondary | ICD-10-CM | POA: Insufficient documentation

## 2018-06-18 DIAGNOSIS — Z79899 Other long term (current) drug therapy: Secondary | ICD-10-CM | POA: Insufficient documentation

## 2018-06-18 DIAGNOSIS — Z87891 Personal history of nicotine dependence: Secondary | ICD-10-CM | POA: Insufficient documentation

## 2018-06-18 DIAGNOSIS — K5900 Constipation, unspecified: Secondary | ICD-10-CM | POA: Insufficient documentation

## 2018-06-18 DIAGNOSIS — E039 Hypothyroidism, unspecified: Secondary | ICD-10-CM | POA: Insufficient documentation

## 2018-06-18 DIAGNOSIS — R110 Nausea: Secondary | ICD-10-CM | POA: Insufficient documentation

## 2018-06-18 DIAGNOSIS — E876 Hypokalemia: Secondary | ICD-10-CM | POA: Insufficient documentation

## 2018-06-18 DIAGNOSIS — C250 Malignant neoplasm of head of pancreas: Secondary | ICD-10-CM | POA: Insufficient documentation

## 2018-06-18 DIAGNOSIS — R5383 Other fatigue: Secondary | ICD-10-CM | POA: Insufficient documentation

## 2018-06-18 DIAGNOSIS — Z7982 Long term (current) use of aspirin: Secondary | ICD-10-CM | POA: Insufficient documentation

## 2018-06-18 DIAGNOSIS — K219 Gastro-esophageal reflux disease without esophagitis: Secondary | ICD-10-CM | POA: Insufficient documentation

## 2018-06-18 DIAGNOSIS — Z90411 Acquired partial absence of pancreas: Secondary | ICD-10-CM | POA: Insufficient documentation

## 2018-06-18 DIAGNOSIS — R7989 Other specified abnormal findings of blood chemistry: Secondary | ICD-10-CM | POA: Insufficient documentation

## 2018-06-21 DIAGNOSIS — C250 Malignant neoplasm of head of pancreas: Secondary | ICD-10-CM | POA: Diagnosis not present

## 2018-06-22 ENCOUNTER — Inpatient Hospital Stay: Payer: Medicare Other

## 2018-06-22 ENCOUNTER — Inpatient Hospital Stay (HOSPITAL_BASED_OUTPATIENT_CLINIC_OR_DEPARTMENT_OTHER): Payer: Medicare Other | Admitting: Oncology

## 2018-06-22 ENCOUNTER — Other Ambulatory Visit: Payer: Self-pay

## 2018-06-22 ENCOUNTER — Telehealth (INDEPENDENT_AMBULATORY_CARE_PROVIDER_SITE_OTHER): Payer: Self-pay

## 2018-06-22 ENCOUNTER — Encounter: Payer: Self-pay | Admitting: Oncology

## 2018-06-22 ENCOUNTER — Encounter (INDEPENDENT_AMBULATORY_CARE_PROVIDER_SITE_OTHER): Payer: Self-pay

## 2018-06-22 VITALS — BP 141/85 | HR 83 | Temp 96.3°F | Resp 18 | Wt 166.7 lb

## 2018-06-22 DIAGNOSIS — R7989 Other specified abnormal findings of blood chemistry: Secondary | ICD-10-CM

## 2018-06-22 DIAGNOSIS — Z7982 Long term (current) use of aspirin: Secondary | ICD-10-CM | POA: Diagnosis not present

## 2018-06-22 DIAGNOSIS — R110 Nausea: Secondary | ICD-10-CM

## 2018-06-22 DIAGNOSIS — R5383 Other fatigue: Secondary | ICD-10-CM | POA: Diagnosis not present

## 2018-06-22 DIAGNOSIS — Z90411 Acquired partial absence of pancreas: Secondary | ICD-10-CM | POA: Diagnosis not present

## 2018-06-22 DIAGNOSIS — E876 Hypokalemia: Secondary | ICD-10-CM | POA: Diagnosis not present

## 2018-06-22 DIAGNOSIS — E039 Hypothyroidism, unspecified: Secondary | ICD-10-CM

## 2018-06-22 DIAGNOSIS — Z982 Presence of cerebrospinal fluid drainage device: Secondary | ICD-10-CM

## 2018-06-22 DIAGNOSIS — Z87891 Personal history of nicotine dependence: Secondary | ICD-10-CM

## 2018-06-22 DIAGNOSIS — Z79899 Other long term (current) drug therapy: Secondary | ICD-10-CM

## 2018-06-22 DIAGNOSIS — C250 Malignant neoplasm of head of pancreas: Secondary | ICD-10-CM

## 2018-06-22 DIAGNOSIS — K5900 Constipation, unspecified: Secondary | ICD-10-CM | POA: Diagnosis not present

## 2018-06-22 DIAGNOSIS — I10 Essential (primary) hypertension: Secondary | ICD-10-CM | POA: Diagnosis not present

## 2018-06-22 DIAGNOSIS — K219 Gastro-esophageal reflux disease without esophagitis: Secondary | ICD-10-CM

## 2018-06-22 LAB — COMPREHENSIVE METABOLIC PANEL
ALT: 14 U/L (ref 0–44)
AST: 25 U/L (ref 15–41)
Albumin: 3.9 g/dL (ref 3.5–5.0)
Alkaline Phosphatase: 64 U/L (ref 38–126)
Anion gap: 13 (ref 5–15)
BUN: 11 mg/dL (ref 8–23)
CO2: 28 mmol/L (ref 22–32)
Calcium: 10.4 mg/dL — ABNORMAL HIGH (ref 8.9–10.3)
Chloride: 96 mmol/L — ABNORMAL LOW (ref 98–111)
Creatinine, Ser: 0.92 mg/dL (ref 0.44–1.00)
GFR calc Af Amer: >60 mL/min (ref >60)
GFR calc non Af Amer: >60 mL/min (ref >60)
Glucose, Bld: 117 mg/dL — ABNORMAL HIGH (ref 70–99)
Potassium: 3.3 mmol/L — ABNORMAL LOW (ref 3.5–5.1)
Sodium: 137 mmol/L (ref 135–145)
Total Bilirubin: 1 mg/dL (ref 0.3–1.2)
Total Protein: 8.3 g/dL — ABNORMAL HIGH (ref 6.5–8.1)

## 2018-06-22 LAB — CBC WITH DIFFERENTIAL/PLATELET
Abs Immature Granulocytes: 0.01 10*3/uL (ref 0.00–0.07)
Basophils Absolute: 0.1 10*3/uL (ref 0.0–0.1)
Basophils Relative: 1 %
Eosinophils Absolute: 0.3 10*3/uL (ref 0.0–0.5)
Eosinophils Relative: 4 %
HCT: 32.4 % — ABNORMAL LOW (ref 36.0–46.0)
Hemoglobin: 10.4 g/dL — ABNORMAL LOW (ref 12.0–15.0)
Immature Granulocytes: 0 %
Lymphocytes Relative: 27 %
Lymphs Abs: 1.7 10*3/uL (ref 0.7–4.0)
MCH: 27.6 pg (ref 26.0–34.0)
MCHC: 32.1 g/dL (ref 30.0–36.0)
MCV: 85.9 fL (ref 80.0–100.0)
Monocytes Absolute: 0.5 10*3/uL (ref 0.1–1.0)
Monocytes Relative: 7 %
Neutro Abs: 3.8 10*3/uL (ref 1.7–7.7)
Neutrophils Relative %: 61 %
Platelets: 324 10*3/uL (ref 150–400)
RBC: 3.77 MIL/uL — ABNORMAL LOW (ref 3.87–5.11)
RDW: 15.7 % — ABNORMAL HIGH (ref 11.5–15.5)
WBC: 6.3 10*3/uL (ref 4.0–10.5)
nRBC: 0 % (ref 0.0–0.2)

## 2018-06-22 MED ORDER — POTASSIUM CHLORIDE CRYS ER 20 MEQ PO TBCR: 20.0000 meq | EXTENDED_RELEASE_TABLET | Freq: Every day | ORAL | 0 refills | Status: DC

## 2018-06-22 NOTE — Telephone Encounter (Signed)
I attempted to contact the patient to schedule the port placement and had to leave a message for a return call.

## 2018-06-22 NOTE — Progress Notes (Signed)
Hematology/Oncology follow-up note [facility] [phone] [phone]   Patient Care Team: Jodelle Green, FNP as PCP - General (Family Medicine) Clent Jacks, RN as Registered Nurse  REFERRING PROVIDER: Evie Lacks CHIEF COMPLAINTS/REASON FOR VISIT:  Follow-up for management of pancreatic cancer.  HISTORY OF PRESENTING ILLNESS:  Lynn Taylor is a  73 y.o.  female with PMH listed below who was referred to me for evaluation of evaluation of elevated ferritin. Patient recently had lab work-up done on 04/09/2018 which showed ferritin level 1877, iron 97, TIBC 340, iron saturation 29, 04/11/2018 CBC showed hemoglobin 13.2, MCV 81, WBC 7.6, platelet counts 246,000. Patient was referred to hematology for further evaluation of elevated ferritin.  Patient reports recent hospitalization from 04/11/2018 to 04/12/2018.  She presented emergency room with complaints of chest pain.  Ruled out ACS.  Patient also had a recent UTI infection and has been on Macrobid. Denies any family history of hereditary hemochromatosis. Denies shortness of breath Denies joint pain. Reports feeling well today.  INTERVAL HISTORY Lynn Taylor is a 73 y.o. female who has above history reviewed by me today presents for follow up visit for management of newly diagnosed pancreatic cancer. Patient was last seen by me on 04/14/2018 for elevated ferritin. Ultrasound of liver was obtained which showed CBD and intrahepatic biliary dilatation Patient wa she is s advised to go to emergency room for further evaluation. Also had hyperbilirubinemia.  Patient was complaining about being jaundiced, pruritus all over. MR abdomen MRCP with and without contrast showed market biliary duct dilatation, secondary to obstruction in the region of pancreatic head.  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 No abdominal adenopathy, liver  metastasis or cross vascular involvement.  patient was evaluated by gastroenterology and status post ERCP with stenting. CA 19.9 1173 CEA 3.9 Denies Patient was accompanied by daughter today.  She reports that jaundice and pruritus have improved.  Any abdominal pain.  Reports feeling very fatigued. Decreased appetite.  Feels mild nausea when she eats.  Reports not drinking much fluid.  # 05/25/2018  patient was referred to [facility] and s/p whipple resection.Her postop course was c/b Type A pancreatic leak and c.diff. Pathology showed A. Hepatic artery lymph node, excision: One lymph node, negative for malignancy (0/1). B. Biliary stent, removal: Medical device, consistent with stent, gross examination only. C. 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 All margins are negative (closest margin=uncinate, 0.5 mm) Metastatic adenocarcinoma in one of thirty-two lymph nodes (1/32).  Portion of stomach and duodenum with no specific pathologic diagnosis.   Grade, 3 poorly differentiated.  Pathologic pT2 pN1   INTERVAL HISTORY Lynn Taylor is a 73 y.o. female who has above history reviewed by me today presents for follow up visit for management of pancreatic cancer.  Today she is 4 weeks post surgery. Patient was seen by Dr. Fayrene Helper [facility] surgery yesterday.  Patient is recovering from surgery. Still has pancreatic leak, about 15 cc/day.   octreotide depot placed. she has another appointment to see [facility] surgery in 2 weeks. Constipation, reports having bowel movement every couple of days.  She is currently taking MiraLAX daily. She was suggested previously to take Linzess however co-pay is too high.  So she is not taking.  Reports that she does not drink much fluid daily.  Fatigue level is getting better.  Review of Systems  Constitutional: Positive  for fatigue.  Negative for appetite change, chills and fever.  HENT:   Negative for hearing loss and voice change.   Eyes: Negative for eye problems.  Respiratory: Negative for chest tightness and cough.   Cardiovascular: Negative for chest pain.  Gastrointestinal: Negative for abdominal distention, abdominal pain, blood in stool and nausea.       Pancreatic leak, 15cc per day  Endocrine: Negative for hot flashes.  Genitourinary: Negative for difficulty urinating and frequency.   Musculoskeletal: Negative for arthralgias.  Skin: Negative for itching and rash.  Neurological: Negative for extremity weakness.  Hematological: Negative for adenopathy.  Psychiatric/Behavioral: Negative for confusion.     MEDICAL HISTORY:  Past Medical History:  Diagnosis Date  . Cancer ([place])   . GERD (gastroesophageal reflux disease)   . Hypertension   . Hypothyroidism   . Malignant neoplasm of head of pancreas ([place]) 06/08/2018    SURGICAL HISTORY: Past Surgical History:  Procedure Laterality Date  . CHOLECYSTECTOMY    . COLONOSCOPY N/A 12/02/2016   Procedure: COLONOSCOPY;  Surgeon: Lollie Sails, MD;  Location: [facility] ENDOSCOPY;  Service: Endoscopy;  Laterality: N/A;  . ERCP N/A 04/30/2018   Procedure: ENDOSCOPIC RETROGRADE CHOLANGIOPANCREATOGRAPHY (ERCP);  Surgeon: Lucilla Lame, MD;  Location[facility] ENDOSCOPY;  Service: Endoscopy;  Laterality: N/A;  . ERCP N/A 05/05/2018   Procedure: ENDOSCOPIC RETROGRADE CHOLANGIOPANCREATOGRAPHY (ERCP);  Surgeon: Lucilla Lame, MD;  Location: [facility] ENDOSCOPY;  Service: Endoscopy;  Laterality: N/A;    SOCIAL HISTORY: Social History   Socioeconomic History  . Marital status: Married    Spouse name: Not on file  . Number of children: Not on file  . Years of education: Not on file  . Highest education level: Not on file  Occupational History  . Not on file  Social Needs  . Financial resource strain: Not on file  . Food insecurity:    Worry: Not on file    Inability:  Not on file  . Transportation needs:    Medical: Not on file    Non-medical: Not on file  Tobacco Use  . Smoking status: Former Smoker    Last attempt to quit: 04/14/1988    Years since quitting: 30.2  . Smokeless tobacco: Never Used  Substance and Sexual Activity  . Alcohol use: Yes    Comment: social drinker  . Drug use: No  . Sexual activity: Not Currently  Lifestyle  . Physical activity:    Days per week: Not on file    Minutes per session: Not on file  . Stress: Not on file  Relationships  . Social connections:    Talks on phone: Not on file    Gets together: Not on file    Attends religious service: Not on file    Active member of club or organization: Not on file    Attends meetings of clubs or organizations: Not on file    Relationship status: Not on file  . Intimate partner violence:    Fear of current or ex partner: Not on file    Emotionally abused: Not on file    Physically abused: Not on file    Forced sexual activity: Not on file  Other Topics Concern  . Not on file  Social History Narrative  . Not on file    FAMILY HISTORY: Family History  Problem Relation Age of Onset  . Diabetes Mellitus I Other   . Alcoholism Other   . Hypertension Other   . Hyperlipidemia Other   .  Coronary artery disease Other   . Stroke Other   . Osteoarthritis Other   . Migraines Other   . Heart Problems Mother   . Stroke Sister   . CAD Brother   . Stroke Brother   . Breast cancer Neg Hx     ALLERGIES:  has No Known Allergies.  MEDICATIONS:  Current Outpatient Medications  Medication Sig Dispense Refill  . aspirin EC 81 MG tablet Take 81 mg by mouth daily.    . Cholecalciferol (VITAMIN D-3) 25 MCG (1000 UT) CAPS Take by mouth.    . dexamethasone (DECADRON) 4 MG tablet Take 2 tablets (8 mg total) by mouth daily. Start the day after chemo for 2 days. 8 tablet 5  . hydrOXYzine (ATARAX/VISTARIL) 25 MG tablet Take 0.5 tablets (12.5 mg total) by mouth at bedtime as  needed. 15 tablet 0  . levothyroxine (SYNTHROID, LEVOTHROID) 100 MCG tablet Take 1 tablet (100 mcg total) by mouth daily before breakfast. 90 tablet 4  . linaclotide (LINZESS) 145 MCG CAPS capsule Take 145 mcg by mouth daily before breakfast.    . ondansetron (ZOFRAN) 4 MG tablet Take 1 tablet (4 mg total) by mouth every 6 (six) hours as needed for nausea or vomiting. 30 tablet 3  . prochlorperazine (COMPAZINE) 10 MG tablet Take 1 tablet (10 mg total) by mouth every 6 (six) hours as needed (NAUSEA). 30 tablet 1  . olmesartan-hydrochlorothiazide (BENICAR HCT) 20-12.5 MG tablet Take 1 tablet by mouth daily. (Patient not taking: Reported on 06/22/2018) 30 tablet 5  . potassium chloride SA (K-DUR,KLOR-CON) 20 MEQ tablet Take 1 tablet (20 mEq total) by mouth daily. 30 tablet 0   No current facility-administered medications for this visit.      PHYSICAL EXAMINATION: ECOG PERFORMANCE STATUS: 1 - Symptomatic but completely ambulatory Vitals:   06/22/18 1011  BP: (!) 141/85  Pulse: 83  Resp: 18  Temp: (!) 96.3 F (35.7 C)   Filed Weights   06/22/18 1011  Weight: 166 lb 11.2 oz (75.6 kg)    Physical Exam Constitutional:      General: She is not in acute distress. HENT:     Head: Normocephalic and atraumatic.  Eyes:     General: No scleral icterus.    Pupils: Pupils are equal, round, and reactive to light.  Neck:     Musculoskeletal: Normal range of motion and neck supple.  Cardiovascular:     Rate and Rhythm: Normal rate and regular rhythm.     Heart sounds: Normal heart sounds.  Pulmonary:     Effort: Pulmonary effort is normal. No respiratory distress.     Breath sounds: No wheezing.  Abdominal:     General: Bowel sounds are normal. There is no distension.     Palpations: Abdomen is soft. There is no mass.     Tenderness: There is no abdominal tenderness.     Comments: JP drain tube +  Musculoskeletal: Normal range of motion.        General: No deformity.  Skin:    General:  Skin is warm and dry.     Findings: No erythema or rash.  Neurological:     Mental Status: She is alert and oriented to person, place, and time.     Cranial Nerves: No cranial nerve deficit.     Coordination: Coordination normal.  Psychiatric:        Behavior: Behavior normal.        Thought Content: Thought content normal.  LABORATORY DATA:  I have reviewed the data as listed Lab Results  Component Value Date   WBC 6.3 06/22/2018   HGB 10.4 (L) 06/22/2018   HCT 32.4 (L) 06/22/2018   MCV 85.9 06/22/2018   PLT 324 06/22/2018   Recent Labs    04/21/18 1046  04/29/18 0612  05/02/18 0751 05/04/18 0921 05/10/18 1429 06/22/18 0926  NA 137   < > 136   < > 133* 128*  --  137  K 3.8   < > 3.3*   < > 3.6 4.0  --  3.3*  CL 100   < > 108   < > 101 97*  --  96*  CO2 22   < > 23   < > 24 24  --  28  GLUCOSE 95   < > 101*   < > 101* 114*  --  117*  BUN 23   < > 9   < > 8 9  --  11  CREATININE 1.11*   < > 0.49   < > 0.74 0.74  --  0.92  CALCIUM 10.6*   < > 9.2   < > 9.4 9.7  --  10.4*  GFRNONAA 50*   < > >60   < > >60 >60  --  >60  GFRAA 57*   < > >60   < > >60 >60  --  >60  PROT 7.7   < >  --    < > 6.7 7.5 7.3 8.3*  ALBUMIN 3.9   < >  --    < > 2.5* 2.8* 3.8 3.9  AST 61*   < >  --    < > 52* 60* 94* 25  ALT 121*   < >  --    < > 40 41 80* 14  ALKPHOS 275*   < >  --    < > 218* 236* 200* 64  BILITOT 11.7*   < > 13.7*   < > 13.8* 11.9* 6.2* 1.0  BILIDIR 8.84*  --  8.2*  --   --   --  4.58*  --    < > = values in this interval not displayed.   Iron/TIBC/Ferritin/ %Sat    Component Value Date/Time   IRON 77 05/04/2018 0831   IRON 97 04/09/2018 1440   TIBC 320 05/04/2018 0831   TIBC 340 04/09/2018 1440   FERRITIN 496 (H) 05/04/2018 0831   FERRITIN 1,877 (H) 04/09/2018 1440   IRONPCTSAT 24 05/04/2018 0831   IRONPCTSAT 29 04/09/2018 1440        ASSESSMENT & PLAN:  1. Malignant neoplasm of head of pancreas ([place])   2. Hypercalcemia   3. Hypokalemia   4.  Constipation, unspecified constipation type    # pT2 pN1 M0, Stage IIB pancreatic cancer S/p whipple resection.  Today she is 4 weeks post operation.  Doing well.  reviewed her pathology and discussed about adjuvant chemotherapy with FOLFIRINOX every 14 days for 6 months.  She will need a Mediport placed.  Will refer to vascular surgery for Mediport placement.  Discussed with patient. Patient has already been to chemo therapy education. Awaiting surgery to clear patient to start on adjuvant chemotherapy.  Plan to start chemotherapy between 6 to 8 weeks postop.will obtain post operative CT scan. Post op CA 19.9 pending.   Patient will follow up with surgery in 2 weeks.  I asked patient to update [place] if her JP drain being  pulled earlier.  #Hypokalemia, recommend patient to start taking potassium chloride supplementation 20 mEq daily.  Likely secondary to loss of body fluids.  #Hypercalcemia, etiology unknown.  Patient provided clinical history of not drinking much water lately. I offered patient IV fluid, patient prefers to try increasing oral hydration first.  Repeat a BMP in 3 days.  Trend calcium level. #Constipation, advised patient to start Colace 100 mg 1-2 times a day.  She may use MiraLAX daily as needed for rescue. Discussed about genetic testing.  refer to genetic counselor, she has an appointment with genetic counselor.  # We spent sufficient time to discuss many aspect of care, questions were answered to patient's satisfaction.  Orders Placed This Encounter  Procedures  . CT Chest W Contrast    Standing Status:   Future    Standing Expiration Date:   06/22/2019    Order Specific Question:   ** REASON FOR EXAM (FREE TEXT)    Answer:   post operation follow up of pancreatic cancer    Order Specific Question:   If indicated for the ordered procedure, I authorize the administration of contrast media per Radiology protocol    Answer:   Yes    Order Specific Question:   Preferred  imaging location?    Answer:   [facility]    Order Specific Question:   Radiology Contrast Protocol - do NOT remove file path    Answer:   \\[email or web address]  . CT ABDOMEN PELVIS W CONTRAST    Standing Status:   Future    Standing Expiration Date:   09/21/2019    Order Specific Question:   ** REASON FOR EXAM (FREE TEXT)    Answer:   post op baseline    Order Specific Question:   If indicated for the ordered procedure, I authorize the administration of contrast media per Radiology protocol    Answer:   Yes    Order Specific Question:   Preferred imaging location?    Answer:   [facility]    Order Specific Question:   Is Oral Contrast requested for this exam?    Answer:   Yes, Per Radiology protocol    Order Specific Question:   Radiology Contrast Protocol - do NOT remove file path    Answer:   \\[email or web address]  . Basic metabolic panel    Standing Status:   Future    Standing Expiration Date:   06/23/2019  . Ambulatory referral to Vascular Surgery    Referral Priority:   Routine    Referral Type:   Surgical    Referral Reason:   Specialty Services Required    Referred to Provider:   Algernon Huxley, MD    Requested Specialty:   Vascular Surgery    Number of Visits Requested:   1    All questions were answered. The patient knows to call the clinic with any problems questions or concerns.  Return of visit: 2 weeks.   Earlie Server, MD, PhD Hematology Oncology [facility] at [facility] Pager- [phone] 06/22/2018

## 2018-06-22 NOTE — Progress Notes (Signed)
Patient here for follow up. Still has drain because she still had a lot of fluid draining. She was given octreotide depot shot yesterday to "slow fluid down."

## 2018-06-23 LAB — CANCER ANTIGEN 19-9: CA 19-9: 21 U/mL (ref 0–35)

## 2018-06-24 ENCOUNTER — Other Ambulatory Visit (INDEPENDENT_AMBULATORY_CARE_PROVIDER_SITE_OTHER): Payer: Self-pay | Admitting: Nurse Practitioner

## 2018-06-25 ENCOUNTER — Inpatient Hospital Stay: Payer: Medicare Other

## 2018-06-25 DIAGNOSIS — Z90411 Acquired partial absence of pancreas: Secondary | ICD-10-CM | POA: Diagnosis not present

## 2018-06-25 DIAGNOSIS — C250 Malignant neoplasm of head of pancreas: Secondary | ICD-10-CM

## 2018-06-25 DIAGNOSIS — E039 Hypothyroidism, unspecified: Secondary | ICD-10-CM | POA: Diagnosis not present

## 2018-06-25 DIAGNOSIS — E876 Hypokalemia: Secondary | ICD-10-CM | POA: Diagnosis not present

## 2018-06-25 DIAGNOSIS — R7989 Other specified abnormal findings of blood chemistry: Secondary | ICD-10-CM | POA: Diagnosis not present

## 2018-06-25 DIAGNOSIS — Z79899 Other long term (current) drug therapy: Secondary | ICD-10-CM | POA: Diagnosis not present

## 2018-06-25 DIAGNOSIS — K219 Gastro-esophageal reflux disease without esophagitis: Secondary | ICD-10-CM | POA: Diagnosis not present

## 2018-06-25 DIAGNOSIS — K5900 Constipation, unspecified: Secondary | ICD-10-CM | POA: Diagnosis not present

## 2018-06-25 DIAGNOSIS — R5383 Other fatigue: Secondary | ICD-10-CM | POA: Diagnosis not present

## 2018-06-25 DIAGNOSIS — Z7982 Long term (current) use of aspirin: Secondary | ICD-10-CM | POA: Diagnosis not present

## 2018-06-25 DIAGNOSIS — I10 Essential (primary) hypertension: Secondary | ICD-10-CM | POA: Diagnosis not present

## 2018-06-25 DIAGNOSIS — Z87891 Personal history of nicotine dependence: Secondary | ICD-10-CM | POA: Diagnosis not present

## 2018-06-25 DIAGNOSIS — R110 Nausea: Secondary | ICD-10-CM | POA: Diagnosis not present

## 2018-06-25 LAB — COMPREHENSIVE METABOLIC PANEL
ALT: 13 U/L (ref 0–44)
AST: 25 U/L (ref 15–41)
Albumin: 4 g/dL (ref 3.5–5.0)
Alkaline Phosphatase: 59 U/L (ref 38–126)
Anion gap: 10 (ref 5–15)
BUN: 11 mg/dL (ref 8–23)
CO2: 26 mmol/L (ref 22–32)
Calcium: 10.2 mg/dL (ref 8.9–10.3)
Chloride: 100 mmol/L (ref 98–111)
Creatinine, Ser: 0.91 mg/dL (ref 0.44–1.00)
GFR calc Af Amer: >60 mL/min (ref >60)
GFR calc non Af Amer: >60 mL/min (ref >60)
Glucose, Bld: 137 mg/dL — ABNORMAL HIGH (ref 70–99)
Potassium: 4.3 mmol/L (ref 3.5–5.1)
Sodium: 136 mmol/L (ref 135–145)
Total Bilirubin: 0.8 mg/dL (ref 0.3–1.2)
Total Protein: 8.3 g/dL — ABNORMAL HIGH (ref 6.5–8.1)

## 2018-06-25 LAB — CBC WITH DIFFERENTIAL/PLATELET
Abs Immature Granulocytes: 0.04 10*3/uL (ref 0.00–0.07)
Basophils Absolute: 0.1 10*3/uL (ref 0.0–0.1)
Basophils Relative: 1 %
Eosinophils Absolute: 0.2 10*3/uL (ref 0.0–0.5)
Eosinophils Relative: 3 %
HCT: 32.2 % — ABNORMAL LOW (ref 36.0–46.0)
Hemoglobin: 10.1 g/dL — ABNORMAL LOW (ref 12.0–15.0)
Immature Granulocytes: 1 %
Lymphocytes Relative: 22 %
Lymphs Abs: 1.7 10*3/uL (ref 0.7–4.0)
MCH: 27.8 pg (ref 26.0–34.0)
MCHC: 31.4 g/dL (ref 30.0–36.0)
MCV: 88.7 fL (ref 80.0–100.0)
Monocytes Absolute: 0.4 10*3/uL (ref 0.1–1.0)
Monocytes Relative: 5 %
Neutro Abs: 5.5 10*3/uL (ref 1.7–7.7)
Neutrophils Relative %: 68 %
Platelets: 272 10*3/uL (ref 150–400)
RBC: 3.63 MIL/uL — ABNORMAL LOW (ref 3.87–5.11)
RDW: 15.6 % — ABNORMAL HIGH (ref 11.5–15.5)
WBC: 8 10*3/uL (ref 4.0–10.5)
nRBC: 0 % (ref 0.0–0.2)

## 2018-06-28 ENCOUNTER — Ambulatory Visit
Admission: RE | Admit: 2018-06-28 | Discharge: 2018-06-28 | Disposition: A | Discharge status: Home / Self Care | Payer: Medicare Other | Attending: Vascular Surgery | Admitting: Vascular Surgery

## 2018-06-28 ENCOUNTER — Other Ambulatory Visit: Payer: Self-pay

## 2018-06-28 ENCOUNTER — Encounter
Admission: RE | Disposition: A | Discharge status: Home / Self Care | Payer: Self-pay | Source: Home / Self Care | Attending: Vascular Surgery

## 2018-06-28 DIAGNOSIS — C259 Malignant neoplasm of pancreas, unspecified: Secondary | ICD-10-CM | POA: Insufficient documentation

## 2018-06-28 HISTORY — PX: PORTA CATH INSERTION: CATH118285

## 2018-06-28 SURGERY — PORTA CATH INSERTION
Anesthesia: Moderate Sedation

## 2018-06-28 MED ORDER — HEPARIN (PORCINE) IN NACL 1000-0.9 UT/500ML-% IV SOLN
INTRAVENOUS | Status: AC
  Filled 2018-06-28: qty 500

## 2018-06-28 MED ORDER — LIDOCAINE-EPINEPHRINE (PF) 1 %-1:200000 IJ SOLN
INTRAMUSCULAR | Status: AC
  Filled 2018-06-28: qty 30

## 2018-06-28 MED ORDER — ONDANSETRON HCL 4 MG/2ML IJ SOLN: 4.0000 mg | Freq: Four times a day (QID) | INTRAMUSCULAR | Status: DC | PRN

## 2018-06-28 MED ORDER — SODIUM CHLORIDE 0.9 % IV SOLN
INTRAVENOUS | Status: DC
  Administered 2018-06-28: 10:00:00 via INTRAVENOUS

## 2018-06-28 MED ORDER — MIDAZOLAM HCL 2 MG/2ML IJ SOLN
INTRAMUSCULAR | Status: DC | PRN
  Administered 2018-06-28 (×2): 1 mg via INTRAVENOUS
  Administered 2018-06-28: 2 mg via INTRAVENOUS

## 2018-06-28 MED ORDER — FENTANYL CITRATE (PF) 100 MCG/2ML IJ SOLN
INTRAMUSCULAR | Status: DC | PRN
  Administered 2018-06-28 (×2): 25 ug via INTRAVENOUS
  Administered 2018-06-28: 50 ug via INTRAVENOUS

## 2018-06-28 MED ORDER — MIDAZOLAM HCL 5 MG/5ML IJ SOLN
INTRAMUSCULAR | Status: AC
  Filled 2018-06-28: qty 5

## 2018-06-28 MED ORDER — HYDROMORPHONE HCL 1 MG/ML IJ SOLN: 1.0000 mg | Freq: Once | INTRAMUSCULAR | Status: DC | PRN

## 2018-06-28 MED ORDER — DEXTROSE 5 % IV SOLN
2.0000 g | Freq: Once | INTRAVENOUS | Status: AC
  Administered 2018-06-28: 2 g via INTRAVENOUS

## 2018-06-28 MED ORDER — SODIUM CHLORIDE 0.9 % IV SOLN
Freq: Once | INTRAVENOUS | Status: AC
  Administered 2018-06-28: 11:00:00
  Filled 2018-06-28: qty 80

## 2018-06-28 MED ORDER — FENTANYL CITRATE (PF) 100 MCG/2ML IJ SOLN
INTRAMUSCULAR | Status: AC
  Filled 2018-06-28: qty 2

## 2018-06-28 SURGICAL SUPPLY — 14 items
BRUSH SCRUB 4% CHG (MISCELLANEOUS) ×1 IMPLANT
DERMABOND ADVANCED (GAUZE/BANDAGES/DRESSINGS) ×1
DERMABOND ADVANCED .7 DNX12 (GAUZE/BANDAGES/DRESSINGS) IMPLANT
HANDLE YANKAUER SUCT BULB TIP (MISCELLANEOUS) ×1 IMPLANT
KIT PORT POWER 8FR ISP CVUE (Port) ×1 IMPLANT
PACK ANGIOGRAPHY (CUSTOM PROCEDURE TRAY) ×2 IMPLANT
PAD GROUND ADULT SPLIT (MISCELLANEOUS) ×2 IMPLANT
PENCIL ELECTRO HAND CTR (MISCELLANEOUS) ×2 IMPLANT
PREP CHG 10.5 TEAL (MISCELLANEOUS) ×1 IMPLANT
SUT MNCRL AB 4-0 PS2 18 (SUTURE) ×2 IMPLANT
SUT PROLENE 0 CT 1 30 (SUTURE) ×2 IMPLANT
SUT VIC AB 3-0 CT1 27 (SUTURE) ×1
SUT VIC AB 3-0 CT1 TAPERPNT 27 (SUTURE) IMPLANT
TOWEL OR 17X26 4PK STRL BLUE (TOWEL DISPOSABLE) ×1 IMPLANT

## 2018-06-28 NOTE — Discharge Instructions (Signed)
Implanted Port Insertion, Care After  This sheet gives you information about how to care for yourself after your procedure. Your health care provider may also give you more specific instructions. If you have problems or questions, contact your health care provider.  What can I expect after the procedure?  After the procedure, it is common to have:  · Discomfort at the port insertion site.  · Bruising on the skin over the port. This should improve over 3-4 days.  Follow these instructions at home:  Port care  · After your port is placed, you will get a manufacturer's information card. The card has information about your port. Keep this card with you at all times.  · Take care of the port as told by your health care provider. Ask your health care provider if you or a family member can get training for taking care of the port at home. A home health care nurse may also take care of the port.  · Make sure to remember what type of port you have.  Incision care         · Follow instructions from your health care provider about how to take care of your port insertion site. Make sure you:  ? Wash your hands with soap and water before and after you change your bandage (dressing). If soap and water are not available, use hand sanitizer.  ? Change your dressing as told by your health care provider.  ? Leave stitches (sutures), skin glue, or adhesive strips in place. These skin closures may need to stay in place for 2 weeks or longer. If adhesive strip edges start to loosen and curl up, you may trim the loose edges. Do not remove adhesive strips completely unless your health care provider tells you to do that.  · Check your port insertion site every day for signs of infection. Check for:  ? Redness, swelling, or pain.  ? Fluid or blood.  ? Warmth.  ? Pus or a bad smell.  Activity  · Return to your normal activities as told by your health care provider. Ask your health care provider what activities are safe for you.  · Do not  lift anything that is heavier than 10 lb (4.5 kg), or the limit that you are told, until your health care provider says that it is safe.  General instructions  · Take over-the-counter and prescription medicines only as told by your health care provider.  · Do not take baths, swim, or use a hot tub until your health care provider approves. Ask your health care provider if you may take showers. You may only be allowed to take sponge baths.  · Do not drive for 24 hours if you were given a sedative during your procedure.  · Wear a medical alert bracelet in case of an emergency. This will tell any health care providers that you have a port.  · Keep all follow-up visits as told by your health care provider. This is important.  Contact a health care provider if:  · You cannot flush your port with saline as directed, or you cannot draw blood from the port.  · You have a fever or chills.  · You have redness, swelling, or pain around your port insertion site.  · You have fluid or blood coming from your port insertion site.  · Your port insertion site feels warm to the touch.  · You have pus or a bad smell coming from the port   insertion site.  Get help right away if:  · You have chest pain or shortness of breath.  · You have bleeding from your port that you cannot control.  Summary  · Take care of the port as told by your health care provider. Keep the manufacturer's information card with you at all times.  · Change your dressing as told by your health care provider.  · Contact a health care provider if you have a fever or chills or if you have redness, swelling, or pain around your port insertion site.  · Keep all follow-up visits as told by your health care provider.  This information is not intended to replace advice given to you by your health care provider. Make sure you discuss any questions you have with your health care provider.  Document Released: 03/16/2013 Document Revised: 12/22/2017 Document Reviewed:  12/22/2017  Elsevier Interactive Patient Education © 2019 [employer]

## 2018-06-28 NOTE — Op Note (Signed)
      [facility]       Operative Note  Date: 06/28/2018  Preoperative diagnosis:  1. Pancreatic cancer  Postoperative diagnosis:  Same as above  Procedures: #1. Ultrasound guidance for vascular access to the right internal jugular vein. #2. Fluoroscopic guidance for placement of catheter. #3. Placement of CT compatible Port-A-Cath, right internal jugular vein.  Surgeon: Leotis Pain, MD.   Anesthesia: Local with moderate conscious sedation for approximately 20  minutes using 4 mg of Versed and 100 mcg of Fentanyl  Fluoroscopy time: less than 1 minute  Contrast used: 0  Estimated blood loss: 5 cc  Indication for the procedure:  The patient is a 73 y.o.female with pancreas cancer.  The patient needs a Port-A-Cath for durable venous access, chemotherapy, lab draws, and CT scans. We are asked to place this. Risks and benefits were discussed and informed consent was obtained.  Description of procedure: The patient was brought to the vascular and interventional radiology suite.  Moderate conscious sedation was administered throughout the procedure during a face to face encounter with the patient with my supervision of the RN administering medicines and monitoring the patient's vital signs, pulse oximetry, telemetry and mental status throughout from the start of the procedure until the patient was taken to the recovery room. The right neck chest and shoulder were sterilely prepped and draped, and a sterile surgical field was created. Ultrasound was used to help visualize a patent right internal jugular vein. This was then accessed under direct ultrasound guidance without difficulty with the Seldinger needle and a permanent image was recorded. A J-wire was placed. After skin nick and dilatation, the peel-away sheath was then placed over the wire. I then anesthetized an area under the clavicle approximately 1-2 fingerbreadths. A transverse incision was created and an inferior  pocket was created with electrocautery and blunt dissection. The port was then brought onto the field, placed into the pocket and secured to the chest wall with 2 Prolene sutures. The catheter was connected to the port and tunneled from the subclavicular incision to the access site. Fluoroscopic guidance was then used to cut the catheter to an appropriate length. The catheter was then placed through the peel-away sheath and the peel-away sheath was removed. The catheter tip was parked in excellent location under fluorocoscopic guidance in the SVC just above the right atrium. The pocket was then irrigated with antibiotic impregnated saline and the wound was closed with a running 3-0 Vicryl and a 4-0 Monocryl. The access incision was closed with a single 4-0 Monocryl. The Huber needle was used to withdraw blood and flush the port with heparinized saline. Dermabond was then placed as a dressing. The patient tolerated the procedure well and was taken to the recovery room in stable condition.   Leotis Pain 06/28/2018 11:30 AM   This note was created with Dragon Medical transcription system. Any errors in dictation are purely unintentional.

## 2018-06-28 NOTE — H&P (Signed)
[facility] History & Physical Update  The patient was interviewed and re-examined.  The patient's previous History and Physical has been reviewed and is unchanged.  There is no change in the plan of care. We plan to proceed with the scheduled procedure.  Leotis Pain, MD  06/28/2018, 10:09 AM

## 2018-06-29 ENCOUNTER — Telehealth: Payer: Self-pay | Admitting: *Deleted

## 2018-06-29 ENCOUNTER — Encounter: Payer: Self-pay | Admitting: Vascular Surgery

## 2018-06-29 ENCOUNTER — Other Ambulatory Visit: Payer: Self-pay | Admitting: Oncology

## 2018-06-29 DIAGNOSIS — C250 Malignant neoplasm of head of pancreas: Secondary | ICD-10-CM | POA: Diagnosis not present

## 2018-06-29 MED ORDER — TRAMADOL HCL 50 MG PO TABS: 50.0000 mg | ORAL_TABLET | Freq: Four times a day (QID) | ORAL | 0 refills | Status: DC | PRN

## 2018-06-29 NOTE — Telephone Encounter (Signed)
Patient called asking for pain medicine due to having port inserted. Please advise

## 2018-07-02 ENCOUNTER — Ambulatory Visit
Admission: RE | Admit: 2018-07-02 | Discharge: 2018-07-02 | Disposition: A | Discharge status: Home / Self Care | Payer: Medicare Other | Source: Ambulatory Visit | Attending: Oncology | Admitting: Oncology

## 2018-07-02 DIAGNOSIS — C259 Malignant neoplasm of pancreas, unspecified: Secondary | ICD-10-CM | POA: Diagnosis not present

## 2018-07-02 DIAGNOSIS — C250 Malignant neoplasm of head of pancreas: Secondary | ICD-10-CM | POA: Diagnosis not present

## 2018-07-02 MED ORDER — IOPAMIDOL (ISOVUE-300) INJECTION 61%
100.0000 mL | Freq: Once | INTRAVENOUS | Status: AC | PRN
  Administered 2018-07-02: 100 mL via INTRAVENOUS

## 2018-07-05 DIAGNOSIS — C250 Malignant neoplasm of head of pancreas: Secondary | ICD-10-CM | POA: Diagnosis not present

## 2018-07-06 ENCOUNTER — Encounter: Payer: Self-pay | Admitting: Oncology

## 2018-07-06 ENCOUNTER — Other Ambulatory Visit: Payer: Self-pay

## 2018-07-06 ENCOUNTER — Inpatient Hospital Stay (HOSPITAL_BASED_OUTPATIENT_CLINIC_OR_DEPARTMENT_OTHER): Payer: Medicare Other | Admitting: Oncology

## 2018-07-06 ENCOUNTER — Inpatient Hospital Stay: Payer: Medicare Other

## 2018-07-06 VITALS — BP 158/88 | HR 81 | Temp 96.5°F | Ht 62.0 in | Wt 164.6 lb

## 2018-07-06 DIAGNOSIS — Z87891 Personal history of nicotine dependence: Secondary | ICD-10-CM | POA: Diagnosis not present

## 2018-07-06 DIAGNOSIS — C250 Malignant neoplasm of head of pancreas: Secondary | ICD-10-CM

## 2018-07-06 DIAGNOSIS — I10 Essential (primary) hypertension: Secondary | ICD-10-CM | POA: Diagnosis not present

## 2018-07-06 DIAGNOSIS — E876 Hypokalemia: Secondary | ICD-10-CM | POA: Diagnosis not present

## 2018-07-06 DIAGNOSIS — R7989 Other specified abnormal findings of blood chemistry: Secondary | ICD-10-CM | POA: Diagnosis not present

## 2018-07-06 DIAGNOSIS — Z79899 Other long term (current) drug therapy: Secondary | ICD-10-CM

## 2018-07-06 DIAGNOSIS — K5900 Constipation, unspecified: Secondary | ICD-10-CM | POA: Diagnosis not present

## 2018-07-06 DIAGNOSIS — R5383 Other fatigue: Secondary | ICD-10-CM | POA: Diagnosis not present

## 2018-07-06 DIAGNOSIS — Z90411 Acquired partial absence of pancreas: Secondary | ICD-10-CM | POA: Diagnosis not present

## 2018-07-06 DIAGNOSIS — R110 Nausea: Secondary | ICD-10-CM | POA: Diagnosis not present

## 2018-07-06 DIAGNOSIS — Z7982 Long term (current) use of aspirin: Secondary | ICD-10-CM | POA: Diagnosis not present

## 2018-07-06 DIAGNOSIS — K219 Gastro-esophageal reflux disease without esophagitis: Secondary | ICD-10-CM

## 2018-07-06 DIAGNOSIS — E039 Hypothyroidism, unspecified: Secondary | ICD-10-CM

## 2018-07-06 LAB — CBC WITH DIFFERENTIAL/PLATELET
Abs Immature Granulocytes: 0.02 10*3/uL (ref 0.00–0.07)
Basophils Absolute: 0.1 10*3/uL (ref 0.0–0.1)
Basophils Relative: 1 %
Eosinophils Absolute: 0.3 10*3/uL (ref 0.0–0.5)
Eosinophils Relative: 4 %
HCT: 35.5 % — ABNORMAL LOW (ref 36.0–46.0)
Hemoglobin: 11.1 g/dL — ABNORMAL LOW (ref 12.0–15.0)
Immature Granulocytes: 0 %
Lymphocytes Relative: 28 %
Lymphs Abs: 2 10*3/uL (ref 0.7–4.0)
MCH: 27.1 pg (ref 26.0–34.0)
MCHC: 31.3 g/dL (ref 30.0–36.0)
MCV: 86.6 fL (ref 80.0–100.0)
Monocytes Absolute: 0.4 10*3/uL (ref 0.1–1.0)
Monocytes Relative: 6 %
Neutro Abs: 4.4 10*3/uL (ref 1.7–7.7)
Neutrophils Relative %: 61 %
Platelets: 249 10*3/uL (ref 150–400)
RBC: 4.1 MIL/uL (ref 3.87–5.11)
RDW: 14.6 % (ref 11.5–15.5)
WBC: 7.1 10*3/uL (ref 4.0–10.5)
nRBC: 0 % (ref 0.0–0.2)

## 2018-07-06 LAB — COMPREHENSIVE METABOLIC PANEL
ALT: 13 U/L (ref 0–44)
AST: 30 U/L (ref 15–41)
Albumin: 4.2 g/dL (ref 3.5–5.0)
Alkaline Phosphatase: 64 U/L (ref 38–126)
Anion gap: 10 (ref 5–15)
BUN: 15 mg/dL (ref 8–23)
CO2: 23 mmol/L (ref 22–32)
Calcium: 9.9 mg/dL (ref 8.9–10.3)
Chloride: 102 mmol/L (ref 98–111)
Creatinine, Ser: 0.88 mg/dL (ref 0.44–1.00)
GFR calc Af Amer: >60 mL/min (ref >60)
GFR calc non Af Amer: >60 mL/min (ref >60)
Glucose, Bld: 132 mg/dL — ABNORMAL HIGH (ref 70–99)
Potassium: 3.7 mmol/L (ref 3.5–5.1)
Sodium: 135 mmol/L (ref 135–145)
Total Bilirubin: 1 mg/dL (ref 0.3–1.2)
Total Protein: 8.3 g/dL — ABNORMAL HIGH (ref 6.5–8.1)

## 2018-07-06 NOTE — Progress Notes (Signed)
Hematology/Oncology follow-up note [facility] Telephone:([phone] [phone]   Patient Care Team: Jodelle Green, FNP as PCP - General (Family Medicine) Clent Jacks, RN as Registered Nurse  REFERRING PROVIDER: Evie Lacks CHIEF COMPLAINTS/REASON FOR VISIT:  Follow-up for management of pancreatic cancer.  HISTORY OF PRESENTING ILLNESS:  Lynn Taylor is a  73 y.o.  female with PMH listed below who was referred to me for evaluation of evaluation of elevated ferritin. Patient recently had lab work-up done on 04/09/2018 which showed ferritin level 1877, iron 97, TIBC 340, iron saturation 29, 04/11/2018 CBC showed hemoglobin 13.2, MCV 81, WBC 7.6, platelet counts 246,000. Patient was referred to hematology for further evaluation of elevated ferritin.  Patient reports recent hospitalization from 04/11/2018 to 04/12/2018.  She presented emergency room with complaints of chest pain.  Ruled out ACS.  Patient also had a recent UTI infection and has been on Macrobid. Denies any family history of hereditary hemochromatosis. Denies shortness of breath Denies joint pain. Reports feeling well today.  INTERVAL HISTORY Lynn Taylor is a 73 y.o. female who has above history reviewed by me today presents for follow up visit for management of newly diagnosed pancreatic cancer. Patient was last seen by me on 04/14/2018 for elevated ferritin. Ultrasound of liver was obtained which showed CBD and intrahepatic biliary dilatation Patient wa she is s advised to go to emergency room for further evaluation. Also had hyperbilirubinemia.  Patient was complaining about being jaundiced, pruritus all over. MR abdomen MRCP with and without contrast showed market biliary duct dilatation, secondary to obstruction in the region of pancreatic head.  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 No abdominal adenopathy, liver  metastasis or cross vascular involvement.  patient was evaluated by gastroenterology and status post ERCP with stenting. CA 19.9 1173 CEA 3.9 Denies Patient was accompanied by daughter today.  She reports that jaundice and pruritus have improved.  Any abdominal pain.  Reports feeling very fatigued. Decreased appetite.  Feels mild nausea when she eats.  Reports not drinking much fluid.  # 05/25/2018  patient was referred to [facility] and s/p whipple resection.Her postop course was c/b Type A pancreatic leak and c.diff. Pathology showed A. Hepatic artery lymph node, excision: One lymph node, negative for malignancy (0/1). B. Biliary stent, removal: Medical device, consistent with stent, gross examination only. C. 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 All margins are negative (closest margin=uncinate, 0.5 mm) Metastatic adenocarcinoma in one of thirty-two lymph nodes (1/32).  Portion of stomach and duodenum with no specific pathologic diagnosis.   Grade, 3 poorly differentiated.  Pathologic pT2 pN1   INTERVAL HISTORY Lynn Taylor is a 73 y.o. female who has above history reviewed by me today presents for follow up visit for management of pancreatic cancer.  Today she is 6 weeks post surgery. Patient was seen by [facility] surgery Dr. Fayrene Helper yesterday.  JP drain was pulled out. Per patient, Dr. Fayrene Helper recommend wait another week prior to starting adjuvant chemotherapy. Patient reports no chest pain, nausea, vomiting, diarrhea, abdominal pain fever or chills. Feeling well.  Recovering from surgery. During the interval, patient has also had CT chest abdomen pelvis done as postsurgical baseline.   Review of Systems  Constitutional: Negative for appetite change, chills, fatigue and fever.  HENT:   Negative for hearing loss and voice change.   Eyes: Negative  for eye  problems.  Respiratory: Negative for chest tightness and cough.   Cardiovascular: Negative for chest pain.  Gastrointestinal: Negative for abdominal distention, abdominal pain, blood in stool and nausea.  Endocrine: Negative for hot flashes.  Genitourinary: Negative for difficulty urinating and frequency.   Musculoskeletal: Negative for arthralgias.  Skin: Negative for itching and rash.  Neurological: Negative for extremity weakness.  Hematological: Negative for adenopathy.  Psychiatric/Behavioral: Negative for confusion.     MEDICAL HISTORY:  Past Medical History:  Diagnosis Date  . Cancer ([place])   . GERD (gastroesophageal reflux disease)   . Hypertension   . Hypothyroidism   . Malignant neoplasm of head of pancreas ([place]) 06/08/2018    SURGICAL HISTORY: Past Surgical History:  Procedure Laterality Date  . CHOLECYSTECTOMY    . COLONOSCOPY N/A 12/02/2016   Procedure: COLONOSCOPY;  Surgeon: Lollie Sails, MD;  Location: [facility] ENDOSCOPY;  Service: Endoscopy;  Laterality: N/A;  . ERCP N/A 04/30/2018   Procedure: ENDOSCOPIC RETROGRADE CHOLANGIOPANCREATOGRAPHY (ERCP);  Surgeon: Lucilla Lame, MD;  Location: [facility] ENDOSCOPY;  Service: Endoscopy;  Laterality: N/A;  . ERCP N/A 05/05/2018   Procedure: ENDOSCOPIC RETROGRADE CHOLANGIOPANCREATOGRAPHY (ERCP);  Surgeon: Lucilla Lame, MD;  Location: [facility] ENDOSCOPY;  Service: Endoscopy;  Laterality: N/A;  . PORTA CATH INSERTION N/A 06/28/2018   Procedure: PORTA CATH INSERTION;  Surgeon: Algernon Huxley, MD;  Location: [place] [facility] LAB;  Service: Cardiovascular;  Laterality: N/A;    SOCIAL HISTORY: Social History   Socioeconomic History  . Marital status: Married    Spouse name: Not on file  . Number of children: Not on file  . Years of education: Not on file  . Highest education level: Not on file  Occupational History  . Not on file  Social Needs  . Financial resource strain: Not on file  . Food insecurity:    Worry: Not on  file    Inability: Not on file  . Transportation needs:    Medical: Not on file    Non-medical: Not on file  Tobacco Use  . Smoking status: Former Smoker    Last attempt to quit: 04/14/1988    Years since quitting: 30.2  . Smokeless tobacco: Never Used  Substance and Sexual Activity  . Alcohol use: Yes    Comment: social drinker  . Drug use: No  . Sexual activity: Not Currently  Lifestyle  . Physical activity:    Days per week: Not on file    Minutes per session: Not on file  . Stress: Not on file  Relationships  . Social connections:    Talks on phone: Not on file    Gets together: Not on file    Attends religious service: Not on file    Active member of club or organization: Not on file    Attends meetings of clubs or organizations: Not on file    Relationship status: Not on file  . Intimate partner violence:    Fear of current or ex partner: Not on file    Emotionally abused: Not on file    Physically abused: Not on file    Forced sexual activity: Not on file  Other Topics Concern  . Not on file  Social History Narrative  . Not on file    FAMILY HISTORY: Family History  Problem Relation Age of Onset  . Diabetes Mellitus I Other   . Alcoholism Other   . Hypertension Other   . Hyperlipidemia Other   . Coronary artery disease Other   .  Stroke Other   . Osteoarthritis Other   . Migraines Other   . Heart Problems Mother   . Stroke Sister   . CAD Brother   . Stroke Brother   . Breast cancer Neg Hx     ALLERGIES:  has No Known Allergies.  MEDICATIONS:  Current Outpatient Medications  Medication Sig Dispense Refill  . aspirin EC 81 MG tablet Take 81 mg by mouth daily.    . Cholecalciferol (VITAMIN D-3) 25 MCG (1000 UT) CAPS Take by mouth.    . dexamethasone (DECADRON) 4 MG tablet Take 2 tablets (8 mg total) by mouth daily. Start the day after chemo for 2 days. 8 tablet 5  . docusate sodium (COLACE) 100 MG capsule Take 100 mg by mouth 2 (two) times daily.      Marland Kitchen levothyroxine (SYNTHROID, LEVOTHROID) 100 MCG tablet Take 1 tablet (100 mcg total) by mouth daily before breakfast. 90 tablet 4  . ondansetron (ZOFRAN) 4 MG tablet Take 1 tablet (4 mg total) by mouth every 6 (six) hours as needed for nausea or vomiting. 30 tablet 3  . potassium chloride SA (K-DUR,KLOR-CON) 20 MEQ tablet Take 1 tablet (20 mEq total) by mouth daily. 30 tablet 0  . prochlorperazine (COMPAZINE) 10 MG tablet Take 1 tablet (10 mg total) by mouth every 6 (six) hours as needed (NAUSEA). 30 tablet 1  . traMADol (ULTRAM) 50 MG tablet Take 1 tablet (50 mg total) by mouth every 6 (six) hours as needed. 30 tablet 0   No current facility-administered medications for this visit.      PHYSICAL EXAMINATION: ECOG PERFORMANCE STATUS: 0 - Asymptomatic Vitals:   07/06/18 1035 07/06/18 1036  BP: (!) 158/88 (!) 158/88  Pulse: 81 81  Temp: (!) 96.5 F (35.8 C) (!) 96.5 F (35.8 C)   Filed Weights   07/06/18 1035 07/06/18 1036  Weight: 164 lb 9 oz (74.6 kg) 164 lb 9 oz (74.6 kg)    Physical Exam Constitutional:      General: She is not in acute distress. HENT:     Head: Normocephalic and atraumatic.  Eyes:     General: No scleral icterus.    Pupils: Pupils are equal, round, and reactive to light.  Neck:     Musculoskeletal: Normal range of motion and neck supple.  Cardiovascular:     Rate and Rhythm: Normal rate and regular rhythm.     Heart sounds: Normal heart sounds.  Pulmonary:     Effort: Pulmonary effort is normal. No respiratory distress.     Breath sounds: No wheezing.  Abdominal:     General: Bowel sounds are normal. There is no distension.     Palpations: Abdomen is soft. There is no mass.     Tenderness: There is no abdominal tenderness.  Musculoskeletal: Normal range of motion.        General: No deformity.  Skin:    General: Skin is warm and dry.     Findings: No erythema or rash.  Neurological:     Mental Status: She is alert and oriented to person, place,  and time.     Cranial Nerves: No cranial nerve deficit.     Coordination: Coordination normal.  Psychiatric:        Behavior: Behavior normal.        Thought Content: Thought content normal.      LABORATORY DATA:  I have reviewed the data as listed Lab Results  Component Value Date   WBC 7.1  07/06/2018   HGB 11.1 (L) 07/06/2018   HCT 35.5 (L) 07/06/2018   MCV 86.6 07/06/2018   PLT 249 07/06/2018   Recent Labs    04/21/18 1046  04/29/18 0612  05/10/18 1429 06/22/18 0926 06/25/18 1354 07/06/18 0950  NA 137   < > 136   < >  --  137 136 135  K 3.8   < > 3.3*   < >  --  3.3* 4.3 3.7  CL 100   < > 108   < >  --  96* 100 102  CO2 22   < > 23   < >  --  28 26 23   GLUCOSE 95   < > 101*   < >  --  117* 137* 132*  BUN 23   < > 9   < >  --  11 11 15   CREATININE 1.11*   < > 0.49   < >  --  0.92 0.91 0.88  CALCIUM 10.6*   < > 9.2   < >  --  10.4* 10.2 9.9  GFRNONAA 50*   < > >60   < >  --  >60 >60 >60  GFRAA 57*   < > >60   < >  --  >60 >60 >60  PROT 7.7   < >  --    < > 7.3 8.3* 8.3* 8.3*  ALBUMIN 3.9   < >  --    < > 3.8 3.9 4.0 4.2  AST 61*   < >  --    < > 94* 25 25 30   ALT 121*   < >  --    < > 80* 14 13 13   ALKPHOS 275*   < >  --    < > 200* 64 59 64  BILITOT 11.7*   < > 13.7*   < > 6.2* 1.0 0.8 1.0  BILIDIR 8.84*  --  8.2*  --  4.58*  --   --   --    < > = values in this interval not displayed.   Iron/TIBC/Ferritin/ %Sat    Component Value Date/Time   IRON 77 05/04/2018 0831   IRON 97 04/09/2018 1440   TIBC 320 05/04/2018 0831   TIBC 340 04/09/2018 1440   FERRITIN 496 (H) 05/04/2018 0831   FERRITIN 1,877 (H) 04/09/2018 1440   IRONPCTSAT 24 05/04/2018 0831   IRONPCTSAT 29 04/09/2018 1440        ASSESSMENT & PLAN:  1. Malignant neoplasm of head of pancreas ([place])   2. Hypercalcemia    # pT2 pN1 M0, Stage IIB pancreatic cancer S/p whipple resection.  Patient is 6 weeks post operation.  Doing satisfactorily During interval, CT chest abdomen pelvis was  obtained for postsurgical baseline. Image was independently reviewed by me and discussed with patient. She had 2 tiny hypodense lesion in the liver.  Stable 5 mm hypodense lesion in the lateral segment left hepatic lobe, on previous MRI this was called a probable cyst. There is a new 3 mm hypodense lesion posterior in the right hepatic lobe, technically too small to characterize. Likely benign however merit surveillance. Will submit patient's case to tumor conference for review of image.  Plan to proceed with adjuvant chemotherapy with FOLFIRINOX every 14 days for 6 months, starting next week.  Post op CA 19.9 is 21.  I went over details of instructions of patient's antiemetics and dexamethasone.  #Hypokalemia, resolved.  Hold additional potassium  supplementation.  #Hypercalcemia, calcium level trended down and normalized.  Likely due to dehydration. Discussed about genetic testing.  refer to genetic counselor, she has an appointment with genetic counselor.  # We spent sufficient time to discuss many aspect of care, questions were answered to patient's satisfaction.  All questions were answered. The patient knows to call the clinic with any problems questions or concerns.  Return of visit: 1 week with labs [cbc cmp] and chemotherapy.   Earlie Server, MD, PhD Hematology Oncology [facility] Pager- [phone] 07/06/2018

## 2018-07-06 NOTE — Progress Notes (Signed)
Patient here today for follow up.   

## 2018-07-08 ENCOUNTER — Other Ambulatory Visit: Payer: Medicare Other

## 2018-07-08 NOTE — Progress Notes (Signed)
Tumor Board Documentation  Lynn Taylor was presented by Verlon Au, RN, OCN at our Tumor Board on 07/08/2018, which included representatives from medical oncology, radiation oncology, internal medicine, navigation, pathology, radiology, surgical, pharmacy, genetics, research, palliative care, pulmonology.  Lynn Taylor currently presents as a current patient, for discussion, for Lynn Taylor with history of the following treatments: surgical intervention(s).  Additionally, we reviewed previous medical and familial history, history of present illness, and recent lab results along with all available histopathologic and imaging studies. The tumor board considered available treatment options and made the following recommendations: Chemotherapy Genetic testing  Surveillance MRI not useful in this case  The following procedures/referrals were also placed: No orders of the defined types were placed in this encounter.   Clinical Trial Status: not discussed   Staging used: AJCC Stage Group  National site-specific guidelines NCCN were discussed with respect to the case.  Tumor board is a meeting of clinicians from various specialty areas who evaluate and discuss patients for whom a multidisciplinary approach is being considered. Final determinations in the plan of care are those of the provider(s). The responsibility for follow up of recommendations given during tumor board is that of the provider.   Today's extended care, comprehensive team conference, Lynn Taylor was not present for the discussion and was not examined.   Multidisciplinary Tumor Board is a multidisciplinary case peer review process.  Decisions discussed in the Multidisciplinary Tumor Board reflect the opinions of the specialists present at the conference without having examined the patient.  Ultimately, treatment and diagnostic decisions rest with the primary provider(s) and the patient.

## 2018-07-13 ENCOUNTER — Encounter: Payer: Self-pay | Admitting: Oncology

## 2018-07-13 ENCOUNTER — Inpatient Hospital Stay (HOSPITAL_BASED_OUTPATIENT_CLINIC_OR_DEPARTMENT_OTHER): Payer: Medicare Other | Admitting: Oncology

## 2018-07-13 ENCOUNTER — Inpatient Hospital Stay: Payer: Medicare Other | Attending: Oncology

## 2018-07-13 ENCOUNTER — Other Ambulatory Visit: Payer: Self-pay

## 2018-07-13 ENCOUNTER — Inpatient Hospital Stay: Payer: Medicare Other

## 2018-07-13 VITALS — BP 167/85 | HR 72 | Temp 96.4°F | Resp 18 | Wt 164.3 lb

## 2018-07-13 VITALS — BP 138/71 | HR 71 | Temp 97.9°F | Resp 18

## 2018-07-13 DIAGNOSIS — Z87891 Personal history of nicotine dependence: Secondary | ICD-10-CM

## 2018-07-13 DIAGNOSIS — I10 Essential (primary) hypertension: Secondary | ICD-10-CM | POA: Insufficient documentation

## 2018-07-13 DIAGNOSIS — Z79899 Other long term (current) drug therapy: Secondary | ICD-10-CM

## 2018-07-13 DIAGNOSIS — E876 Hypokalemia: Secondary | ICD-10-CM

## 2018-07-13 DIAGNOSIS — R7989 Other specified abnormal findings of blood chemistry: Secondary | ICD-10-CM | POA: Insufficient documentation

## 2018-07-13 DIAGNOSIS — D6959 Other secondary thrombocytopenia: Secondary | ICD-10-CM | POA: Insufficient documentation

## 2018-07-13 DIAGNOSIS — R42 Dizziness and giddiness: Secondary | ICD-10-CM | POA: Insufficient documentation

## 2018-07-13 DIAGNOSIS — E785 Hyperlipidemia, unspecified: Secondary | ICD-10-CM | POA: Insufficient documentation

## 2018-07-13 DIAGNOSIS — Z7982 Long term (current) use of aspirin: Secondary | ICD-10-CM | POA: Diagnosis not present

## 2018-07-13 DIAGNOSIS — C250 Malignant neoplasm of head of pancreas: Secondary | ICD-10-CM | POA: Insufficient documentation

## 2018-07-13 DIAGNOSIS — T451X5S Adverse effect of antineoplastic and immunosuppressive drugs, sequela: Secondary | ICD-10-CM | POA: Insufficient documentation

## 2018-07-13 DIAGNOSIS — D649 Anemia, unspecified: Secondary | ICD-10-CM

## 2018-07-13 DIAGNOSIS — K521 Toxic gastroenteritis and colitis: Secondary | ICD-10-CM | POA: Insufficient documentation

## 2018-07-13 DIAGNOSIS — K219 Gastro-esophageal reflux disease without esophagitis: Secondary | ICD-10-CM | POA: Insufficient documentation

## 2018-07-13 DIAGNOSIS — Z5111 Encounter for antineoplastic chemotherapy: Secondary | ICD-10-CM

## 2018-07-13 DIAGNOSIS — D6481 Anemia due to antineoplastic chemotherapy: Secondary | ICD-10-CM | POA: Diagnosis not present

## 2018-07-13 DIAGNOSIS — E871 Hypo-osmolality and hyponatremia: Secondary | ICD-10-CM | POA: Insufficient documentation

## 2018-07-13 DIAGNOSIS — R5383 Other fatigue: Secondary | ICD-10-CM | POA: Diagnosis not present

## 2018-07-13 DIAGNOSIS — R630 Anorexia: Secondary | ICD-10-CM | POA: Diagnosis not present

## 2018-07-13 DIAGNOSIS — H538 Other visual disturbances: Secondary | ICD-10-CM

## 2018-07-13 DIAGNOSIS — D701 Agranulocytosis secondary to cancer chemotherapy: Secondary | ICD-10-CM | POA: Diagnosis not present

## 2018-07-13 DIAGNOSIS — E039 Hypothyroidism, unspecified: Secondary | ICD-10-CM | POA: Insufficient documentation

## 2018-07-13 DIAGNOSIS — K30 Functional dyspepsia: Secondary | ICD-10-CM | POA: Insufficient documentation

## 2018-07-13 DIAGNOSIS — R531 Weakness: Secondary | ICD-10-CM | POA: Diagnosis not present

## 2018-07-13 LAB — COMPREHENSIVE METABOLIC PANEL
ALT: 13 U/L (ref 0–44)
AST: 20 U/L (ref 15–41)
Albumin: 3.9 g/dL (ref 3.5–5.0)
Alkaline Phosphatase: 61 U/L (ref 38–126)
Anion gap: 9 (ref 5–15)
BUN: 15 mg/dL (ref 8–23)
CO2: 23 mmol/L (ref 22–32)
Calcium: 9.5 mg/dL (ref 8.9–10.3)
Chloride: 105 mmol/L (ref 98–111)
Creatinine, Ser: 0.73 mg/dL (ref 0.44–1.00)
GFR calc Af Amer: >60 mL/min (ref >60)
GFR calc non Af Amer: >60 mL/min (ref >60)
Glucose, Bld: 133 mg/dL — ABNORMAL HIGH (ref 70–99)
Potassium: 3.5 mmol/L (ref 3.5–5.1)
Sodium: 137 mmol/L (ref 135–145)
Total Bilirubin: 0.6 mg/dL (ref 0.3–1.2)
Total Protein: 7.7 g/dL (ref 6.5–8.1)

## 2018-07-13 LAB — CBC WITH DIFFERENTIAL/PLATELET
Abs Immature Granulocytes: 0.01 10*3/uL (ref 0.00–0.07)
Basophils Absolute: 0 10*3/uL (ref 0.0–0.1)
Basophils Relative: 1 %
Eosinophils Absolute: 0.3 10*3/uL (ref 0.0–0.5)
Eosinophils Relative: 4 %
HCT: 32.8 % — ABNORMAL LOW (ref 36.0–46.0)
Hemoglobin: 10.3 g/dL — ABNORMAL LOW (ref 12.0–15.0)
Immature Granulocytes: 0 %
Lymphocytes Relative: 28 %
Lymphs Abs: 2.1 10*3/uL (ref 0.7–4.0)
MCH: 26.8 pg (ref 26.0–34.0)
MCHC: 31.4 g/dL (ref 30.0–36.0)
MCV: 85.4 fL (ref 80.0–100.0)
Monocytes Absolute: 0.5 10*3/uL (ref 0.1–1.0)
Monocytes Relative: 6 %
Neutro Abs: 4.5 10*3/uL (ref 1.7–7.7)
Neutrophils Relative %: 61 %
Platelets: 268 10*3/uL (ref 150–400)
RBC: 3.84 MIL/uL — ABNORMAL LOW (ref 3.87–5.11)
RDW: 14.6 % (ref 11.5–15.5)
WBC: 7.4 10*3/uL (ref 4.0–10.5)
nRBC: 0 % (ref 0.0–0.2)

## 2018-07-13 LAB — BASIC METABOLIC PANEL
Anion gap: 8 (ref 5–15)
BUN: 11 mg/dL (ref 8–23)
CO2: 23 mmol/L (ref 22–32)
Calcium: 9.6 mg/dL (ref 8.9–10.3)
Chloride: 100 mmol/L (ref 98–111)
Creatinine, Ser: 0.72 mg/dL (ref 0.44–1.00)
GFR calc Af Amer: >60 mL/min (ref >60)
GFR calc non Af Amer: >60 mL/min (ref >60)
Glucose, Bld: 131 mg/dL — ABNORMAL HIGH (ref 70–99)
Potassium: 3.2 mmol/L — ABNORMAL LOW (ref 3.5–5.1)
Sodium: 131 mmol/L — ABNORMAL LOW (ref 135–145)

## 2018-07-13 LAB — MAGNESIUM: Magnesium: 1.5 mg/dL — ABNORMAL LOW (ref 1.7–2.4)

## 2018-07-13 MED ORDER — SODIUM CHLORIDE 0.9 % IV SOLN
2400.0000 mg/m2 | INTRAVENOUS | Status: DC
  Administered 2018-07-13: 4500 mg via INTRAVENOUS
  Filled 2018-07-13: qty 90

## 2018-07-13 MED ORDER — HEPARIN SOD (PORK) LOCK FLUSH 100 UNIT/ML IV SOLN: 500.0000 [IU] | Freq: Once | INTRAVENOUS | Status: DC

## 2018-07-13 MED ORDER — FLUOROURACIL CHEMO INJECTION 2.5 GM/50ML
400.0000 mg/m2 | Freq: Once | INTRAVENOUS | Status: AC
  Administered 2018-07-13: 750 mg via INTRAVENOUS
  Filled 2018-07-13: qty 15

## 2018-07-13 MED ORDER — LOPERAMIDE HCL 2 MG PO CAPS: 2.0000 mg | ORAL_CAPSULE | ORAL | 1 refills | Status: DC

## 2018-07-13 MED ORDER — DEXAMETHASONE SODIUM PHOSPHATE 10 MG/ML IJ SOLN
10.0000 mg | Freq: Once | INTRAMUSCULAR | Status: AC
  Administered 2018-07-13: 10 mg via INTRAVENOUS
  Filled 2018-07-13: qty 1

## 2018-07-13 MED ORDER — ONDANSETRON HCL 4 MG/2ML IJ SOLN
4.0000 mg | Freq: Once | INTRAMUSCULAR | Status: AC
  Administered 2018-07-13: 4 mg via INTRAVENOUS
  Filled 2018-07-13: qty 2

## 2018-07-13 MED ORDER — IRINOTECAN HCL CHEMO INJECTION 100 MG/5ML
180.0000 mg/m2 | Freq: Once | INTRAVENOUS | Status: AC
  Administered 2018-07-13: 340 mg via INTRAVENOUS
  Filled 2018-07-13: qty 17

## 2018-07-13 MED ORDER — POTASSIUM CHLORIDE CRYS ER 20 MEQ PO TBCR: 40.0000 meq | EXTENDED_RELEASE_TABLET | Freq: Every day | ORAL | 0 refills | Status: DC

## 2018-07-13 MED ORDER — OXALIPLATIN CHEMO INJECTION 100 MG/20ML
150.0000 mg | Freq: Once | INTRAVENOUS | Status: AC
  Administered 2018-07-13: 150 mg via INTRAVENOUS
  Filled 2018-07-13: qty 20

## 2018-07-13 MED ORDER — ATROPINE SULFATE 1 MG/ML IJ SOLN
0.5000 mg | Freq: Once | INTRAMUSCULAR | Status: AC | PRN
  Administered 2018-07-13: 0.5 mg via INTRAVENOUS
  Filled 2018-07-13: qty 1

## 2018-07-13 MED ORDER — SODIUM CHLORIDE 0.9% FLUSH
10.0000 mL | Freq: Once | INTRAVENOUS | Status: AC
  Administered 2018-07-13: 10 mL via INTRAVENOUS
  Filled 2018-07-13: qty 10

## 2018-07-13 MED ORDER — DEXTROSE 5 % IV SOLN
Freq: Once | INTRAVENOUS | Status: AC
  Administered 2018-07-13: 09:00:00 via INTRAVENOUS
  Filled 2018-07-13: qty 250

## 2018-07-13 MED ORDER — PALONOSETRON HCL INJECTION 0.25 MG/5ML
0.2500 mg | Freq: Once | INTRAVENOUS | Status: AC
  Administered 2018-07-13: 0.25 mg via INTRAVENOUS
  Filled 2018-07-13: qty 5

## 2018-07-13 MED ORDER — LEUCOVORIN CALCIUM INJECTION 350 MG
750.0000 mg | Freq: Once | INTRAVENOUS | Status: AC
  Administered 2018-07-13: 750 mg via INTRAVENOUS
  Filled 2018-07-13: qty 37.5

## 2018-07-13 MED ORDER — LORAZEPAM 2 MG/ML IJ SOLN
0.5000 mg | Freq: Once | INTRAMUSCULAR | Status: AC | PRN
  Administered 2018-07-13: 0.5 mg via INTRAVENOUS
  Filled 2018-07-13: qty 1

## 2018-07-13 MED ORDER — LOPERAMIDE HCL 2 MG PO CAPS
4.0000 mg | ORAL_CAPSULE | ORAL | Status: DC | PRN
  Administered 2018-07-13: 4 mg via ORAL
  Filled 2018-07-13: qty 2

## 2018-07-13 NOTE — Progress Notes (Signed)
Patient here for follow up. Patient states she feels anxious due to starting treatment.

## 2018-07-13 NOTE — Progress Notes (Signed)
Hematology/Oncology follow-up note [facility] Telephone:([phone] [phone]   Patient Care Team: Jodelle Green, FNP as PCP - General (Family Medicine) Clent Jacks, RN as Registered Nurse  REFERRING PROVIDER: Evie Lacks CHIEF COMPLAINTS/REASON FOR VISIT:  Follow-up for management of pancreatic cancer.  HISTORY OF PRESENTING ILLNESS:  Lynn Taylor is a  73 y.o.  female with PMH listed below who was referred to me for evaluation of evaluation of elevated ferritin. Patient recently had lab work-up done on 04/09/2018 which showed ferritin level 1877, iron 97, TIBC 340, iron saturation 29, 04/11/2018 CBC showed hemoglobin 13.2, MCV 81, WBC 7.6, platelet counts 246,000. Patient was referred to hematology for further evaluation of elevated ferritin.  Patient reports recent hospitalization from 04/11/2018 to 04/12/2018.  She presented emergency room with complaints of chest pain.  Ruled out ACS.  Patient also had a recent UTI infection and has been on Macrobid. Denies any family history of hereditary hemochromatosis. Denies shortness of breath Denies joint pain. Reports feeling well today.  INTERVAL HISTORY Lynn Taylor is a 73 y.o. female who has above history reviewed by me today presents for follow up visit for management of newly diagnosed pancreatic cancer. Patient was last seen by me on 04/14/2018 for elevated ferritin. Ultrasound of liver was obtained which showed CBD and intrahepatic biliary dilatation Patient wa she is s advised to go to emergency room for further evaluation. Also had hyperbilirubinemia.  Patient was complaining about being jaundiced, pruritus all over. MR abdomen MRCP with and without contrast showed market biliary duct dilatation, secondary to obstruction in the region of pancreatic head.  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 No abdominal adenopathy, liver  metastasis or cross vascular involvement.  patient was evaluated by gastroenterology and status post ERCP with stenting. CA 19.9 1173 CEA 3.9 Denies Patient was accompanied by daughter today.  She reports that jaundice and pruritus have improved.  Any abdominal pain.  Reports feeling very fatigued. Decreased appetite.  Feels mild nausea when she eats.  Reports not drinking much fluid.  # 05/25/2018  patient was referred to [facility] and s/p whipple resection.Her postop course was c/b Type A pancreatic leak and c.diff. Pathology showed A. Hepatic artery lymph node, excision: One lymph node, negative for malignancy (0/1). B. Biliary stent, removal: Medical device, consistent with stent, gross examination only. C. 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 All margins are negative (closest margin=uncinate, 0.5 mm) Metastatic adenocarcinoma in one of thirty-two lymph nodes (1/32).  Portion of stomach and duodenum with no specific pathologic diagnosis.   Grade, 3 poorly differentiated.  Pathologic pT2 pN1   INTERVAL HISTORY Lynn Taylor is a 73 y.o. female who has above history reviewed by me today presents for follow up visit for evaluation prior to adjuvant chemotherapy for treatment of pancreatic cancer.  Today she is 7 weeks after surgery. Patient has no new complaints today, except feeling anxious about starting chemotherapy.    Review of Systems  Constitutional: Negative for appetite change, chills, fatigue and fever.  HENT:   Negative for hearing loss and voice change.   Eyes: Negative for eye problems.  Respiratory: Negative for chest tightness and cough.   Cardiovascular: Negative for chest pain.  Gastrointestinal: Negative for abdominal distention, abdominal pain, blood in stool and nausea.  Endocrine: Negative for hot flashes.  Genitourinary:  Negative for difficulty urinating and frequency.  Musculoskeletal: Negative for arthralgias.  Skin: Negative for itching and rash.  Neurological: Negative for extremity weakness.  Hematological: Negative for adenopathy.  Psychiatric/Behavioral: Negative for confusion. The patient is nervous/anxious.      MEDICAL HISTORY:  Past Medical History:  Diagnosis Date  . Cancer ([place])   . GERD (gastroesophageal reflux disease)   . Hypertension   . Hypothyroidism   . Malignant neoplasm of head of pancreas ([place]) 06/08/2018    SURGICAL HISTORY: Past Surgical History:  Procedure Laterality Date  . CHOLECYSTECTOMY    . COLONOSCOPY N/A 12/02/2016   Procedure: COLONOSCOPY;  Surgeon: Lollie Sails, MD;  Location: [facility] ENDOSCOPY;  Service: Endoscopy;  Laterality: N/A;  . ERCP N/A 04/30/2018   Procedure: ENDOSCOPIC RETROGRADE CHOLANGIOPANCREATOGRAPHY (ERCP);  Surgeon: Lucilla Lame, MD;  Location: [facility] ENDOSCOPY;  Service: Endoscopy;  Laterality: N/A;  . ERCP N/A 05/05/2018   Procedure: ENDOSCOPIC RETROGRADE CHOLANGIOPANCREATOGRAPHY (ERCP);  Surgeon: Lucilla Lame, MD;  Location: [facility] ENDOSCOPY;  Service: Endoscopy;  Laterality: N/A;  . PORTA CATH INSERTION N/A 06/28/2018   Procedure: PORTA CATH INSERTION;  Surgeon: Algernon Huxley, MD;  Location: [place] CV LAB;  Service: Cardiovascular;  Laterality: N/A;    SOCIAL HISTORY: Social History   Socioeconomic History  . Marital status: Married    Spouse name: Not on file  . Number of children: Not on file  . Years of education: Not on file  . Highest education level: Not on file  Occupational History  . Not on file  Social Needs  . Financial resource strain: Not on file  . Food insecurity:    Worry: Not on file    Inability: Not on file  . Transportation needs:    Medical: Not on file    Non-medical: Not on file  Tobacco Use  . Smoking status: Former Smoker    Last attempt to quit: 04/14/1988    Years since quitting: 30.2  .  Smokeless tobacco: Never Used  Substance and Sexual Activity  . Alcohol use: Yes    Comment: social drinker  . Drug use: No  . Sexual activity: Not Currently  Lifestyle  . Physical activity:    Days per week: Not on file    Minutes per session: Not on file  . Stress: Not on file  Relationships  . Social connections:    Talks on phone: Not on file    Gets together: Not on file    Attends religious service: Not on file    Active member of club or organization: Not on file    Attends meetings of clubs or organizations: Not on file    Relationship status: Not on file  . Intimate partner violence:    Fear of current or ex partner: Not on file    Emotionally abused: Not on file    Physically abused: Not on file    Forced sexual activity: Not on file  Other Topics Concern  . Not on file  Social History Narrative  . Not on file    FAMILY HISTORY: Family History  Problem Relation Age of Onset  . Diabetes Mellitus I Other   . Alcoholism Other   . Hypertension Other   . Hyperlipidemia Other   . Coronary artery disease Other   . Stroke Other   . Osteoarthritis Other   . Migraines Other   . Heart Problems Mother   . Stroke Sister   . CAD Brother   . Stroke Brother   . Breast cancer Neg  Hx     ALLERGIES:  has No Known Allergies.  MEDICATIONS:  Current Outpatient Medications  Medication Sig Dispense Refill  . Cholecalciferol (VITAMIN D-3) 25 MCG (1000 UT) CAPS Take by mouth.    . dexamethasone (DECADRON) 4 MG tablet Take 2 tablets (8 mg total) by mouth daily. Start the day after chemo for 2 days. 8 tablet 5  . docusate sodium (COLACE) 100 MG capsule Take 100 mg by mouth 2 (two) times daily.    Marland Kitchen levothyroxine (SYNTHROID, LEVOTHROID) 100 MCG tablet Take 1 tablet (100 mcg total) by mouth daily before breakfast. 90 tablet 4  . ondansetron (ZOFRAN) 4 MG tablet Take 1 tablet (4 mg total) by mouth every 6 (six) hours as needed for nausea or vomiting. 30 tablet 3  . potassium  chloride SA (K-DUR,KLOR-CON) 20 MEQ tablet Take 1 tablet (20 mEq total) by mouth daily. 30 tablet 0  . prochlorperazine (COMPAZINE) 10 MG tablet Take 1 tablet (10 mg total) by mouth every 6 (six) hours as needed (NAUSEA). 30 tablet 1  . traMADol (ULTRAM) 50 MG tablet Take 1 tablet (50 mg total) by mouth every 6 (six) hours as needed. 30 tablet 0  . aspirin EC 81 MG tablet Take 81 mg by mouth daily.    Marland Kitchen loperamide (IMODIUM) 2 MG capsule Take 1 capsule (2 mg total) by mouth See admin instructions. With onset of loose stool, take 4mg  followed by 2mg  every 2 hours until 12 hours have passed without loose bowel movement. Maximum: 16 mg/day 120 capsule 1   No current facility-administered medications for this visit.    Facility-Administered Medications Ordered in Other Visits  Medication Dose Route Frequency Provider Last Rate Last Dose  . fluorouracil (ADRUCIL) 4,500 mg in sodium chloride 0.9 % 60 mL chemo infusion  2,400 mg/m2 (Treatment Plan Recorded) Intravenous 1 day or 1 dose Earlie Server, MD      . fluorouracil (ADRUCIL) chemo injection 750 mg  400 mg/m2 (Treatment Plan Recorded) Intravenous Once Earlie Server, MD      . heparin lock flush 100 unit/mL  500 Units Intravenous Once Earlie Server, MD      . irinotecan (CAMPTOSAR) 340 mg in dextrose 5 % 500 mL chemo infusion  180 mg/m2 (Treatment Plan Recorded) Intravenous Once Earlie Server, MD 345 mL/hr at 07/13/18 1253 340 mg at 07/13/18 1253  . loperamide (IMODIUM) capsule 4 mg  4 mg Oral PRN Earlie Server, MD         PHYSICAL EXAMINATION: ECOG PERFORMANCE STATUS: 0 - Asymptomatic Vitals:   07/13/18 0842  BP: (!) 167/85  Pulse: 72  Resp: 18  Temp: (!) 96.4 F (35.8 C)   Filed Weights   07/13/18 0842  Weight: 164 lb 4.8 oz (74.5 kg)    Physical Exam Constitutional:      General: She is not in acute distress. HENT:     Head: Normocephalic and atraumatic.  Eyes:     General: No scleral icterus.    Pupils: Pupils are equal, round, and reactive to light.   Neck:     Musculoskeletal: Normal range of motion and neck supple.  Cardiovascular:     Rate and Rhythm: Normal rate and regular rhythm.     Heart sounds: Normal heart sounds.  Pulmonary:     Effort: Pulmonary effort is normal. No respiratory distress.     Breath sounds: No wheezing.  Abdominal:     General: Bowel sounds are normal. There is no distension.  Palpations: Abdomen is soft. There is no mass.     Tenderness: There is no abdominal tenderness.  Musculoskeletal: Normal range of motion.        General: No deformity.  Skin:    General: Skin is warm and dry.     Findings: No erythema or rash.  Neurological:     Mental Status: She is alert and oriented to person, place, and time.     Cranial Nerves: No cranial nerve deficit.     Coordination: Coordination normal.  Psychiatric:        Behavior: Behavior normal.        Thought Content: Thought content normal.      LABORATORY DATA:  I have reviewed the data as listed Lab Results  Component Value Date   WBC 7.4 07/13/2018   HGB 10.3 (L) 07/13/2018   HCT 32.8 (L) 07/13/2018   MCV 85.4 07/13/2018   PLT 268 07/13/2018   Recent Labs    04/21/18 1046  04/29/18 0612  05/10/18 1429  06/25/18 1354 07/06/18 0950 07/13/18 0825  NA 137   < > 136   < >  --    < > 136 135 137  K 3.8   < > 3.3*   < >  --    < > 4.3 3.7 3.5  CL 100   < > 108   < >  --    < > 100 102 105  CO2 22   < > 23   < >  --    < > 26 23 23   GLUCOSE 95   < > 101*   < >  --    < > 137* 132* 133*  BUN 23   < > 9   < >  --    < > 11 15 15   CREATININE 1.11*   < > 0.49   < >  --    < > 0.91 0.88 0.73  CALCIUM 10.6*   < > 9.2   < >  --    < > 10.2 9.9 9.5  GFRNONAA 50*   < > >60   < >  --    < > >60 >60 >60  GFRAA 57*   < > >60   < >  --    < > >60 >60 >60  PROT 7.7   < >  --    < > 7.3   < > 8.3* 8.3* 7.7  ALBUMIN 3.9   < >  --    < > 3.8   < > 4.0 4.2 3.9  AST 61*   < >  --    < > 94*   < > 25 30 20   ALT 121*   < >  --    < > 80*   < > 13 13 13     ALKPHOS 275*   < >  --    < > 200*   < > 59 64 61  BILITOT 11.7*   < > 13.7*   < > 6.2*   < > 0.8 1.0 0.6  BILIDIR 8.84*  --  8.2*  --  4.58*  --   --   --   --    < > = values in this interval not displayed.   Iron/TIBC/Ferritin/ %Sat    Component Value Date/Time   IRON 77 05/04/2018 0831   IRON 97 04/09/2018 1440   TIBC 320 05/04/2018 0831   TIBC  340 04/09/2018 1440   FERRITIN 496 (H) 05/04/2018 0831   FERRITIN 1,877 (H) 04/09/2018 1440   IRONPCTSAT 24 05/04/2018 0831   IRONPCTSAT 29 04/09/2018 1440     post op CA 19.9 is 21.    ASSESSMENT & PLAN:  1. Malignant neoplasm of head of pancreas ([place])   2. Anemia, unspecified type   3. Hypokalemia   4. Encounter for antineoplastic chemotherapy    # pT2 pN1 M0, Stage IIB pancreatic cancer S/p whipple resection.  Plan adjuvant chemotherapy with FOLFIRINOX every 14 days for 6 months Patient is 7 weeks post operation.   CT abdomen pelvis findings were discussed on tumor board.  She had 2 tiny hypodense lesion in the liver.  Stable 5 mm hypodense lesion in the lateral segment left hepatic lobe, on previous MRI this was called a probable cyst. There is a new 3 mm hypodense lesion posterior in the right hepatic lobe, technically too small to characterize. Likely benign however merit surveillance.  Consensus reached at a tumor board that continue surveillance.  MRI is not going be helpful. Labs reviewed and discussed with patient.  Counts are acceptable to proceed with cycle 1 FOLFIRINOX I discussed details of instruction of antiemetics, steroids and antidiarrhea medications.  Was called by infusion RN Tillie Rung reporting that patient developed nausea, vomiting and diarrhea, during Irinotecan infusion.  Also complained lightheaded with blurry vision.  Patient was evaluated by me.  Advised nurse to give IV Zofran 4 mg x 1, IV Ativan 0.5 mgx1, Imodium 4 mg oral x1.  Irinotecan infusion was discontinued.  Patient got approximately half of the  planned Irinotecan dose.  BMP and magnesium was repeated Patient symptoms improved, vital signs stable.  Able to finish rest part of 5-FU and started on 5-FU pump Labs returned at 1645, showing that patient have a potassium of 3.2 and magnesium 1.5. No pharmacist available at infusion center.  Cannot proceed with IV infusion.  Given that patient has been feeling pretty well advised patient to take 40 mg potassium chloride tonight and will repeat labs and plan magnesium infusion and potassium infusion in a.m.   We spent sufficient time to discuss many aspect of care, questions were answered to patient's satisfaction. Total face to face encounter time for this patient visit was 40 min. >50% of the time was  spent in counseling and coordination of care. The patient knows to call the clinic with any problems questions or concerns.  Return of visit:   07/14/2018 to repeat BMP and Magnesium +/- IV potassium and Magnesium 07/15/2018 dc pump, possible fluid hydration.  1 week for evaluation of chemotherapy tolerability 2 weeks for assessment prior to next cycle of treatment.  Earlie Server, MD, PhD Hematology Oncology [facility] Pager- [phone] 07/13/2018

## 2018-07-13 NOTE — Progress Notes (Signed)
Notified Dr.Yu regarding patient complaining of nausea with vomiting and diarrhea during irinotecan infusion. Ordered to give Zofran and imodium over the phone per Dr. Tasia Catchings and to continue treatment. At 1515 patient complained of feeling lightheaded with blurry vision. Dr. Tasia Catchings called to come assess patient. Irinotecan infusion stopped and NS bolus started. Ordered to get a CMP and magnesium blood draw.  Lynn Taylor was then given given 0.5 mg of Ativan. Patient is now resting comfortably. Vital signs are stable. Waiting for lab results now.    24/2020 1645 lab results showed that her potassium is 3.3 and mag of 1.5. Patient informed that Dr. Tasia Catchings would like for her to return tomorrow for potassium and magnesium infusion in the morning. She would also like for patient to take 40 mEq of potassium tonight at home. Patient verbalizes that she understands what the Dr. Tasia Catchings has ordered. Vital signs stable.

## 2018-07-14 ENCOUNTER — Inpatient Hospital Stay: Payer: Medicare Other

## 2018-07-14 DIAGNOSIS — D6481 Anemia due to antineoplastic chemotherapy: Secondary | ICD-10-CM | POA: Diagnosis not present

## 2018-07-14 DIAGNOSIS — T451X5S Adverse effect of antineoplastic and immunosuppressive drugs, sequela: Secondary | ICD-10-CM | POA: Diagnosis not present

## 2018-07-14 DIAGNOSIS — R5383 Other fatigue: Secondary | ICD-10-CM | POA: Diagnosis not present

## 2018-07-14 DIAGNOSIS — K30 Functional dyspepsia: Secondary | ICD-10-CM | POA: Diagnosis not present

## 2018-07-14 DIAGNOSIS — E785 Hyperlipidemia, unspecified: Secondary | ICD-10-CM | POA: Diagnosis not present

## 2018-07-14 DIAGNOSIS — I10 Essential (primary) hypertension: Secondary | ICD-10-CM | POA: Diagnosis not present

## 2018-07-14 DIAGNOSIS — R42 Dizziness and giddiness: Secondary | ICD-10-CM | POA: Diagnosis not present

## 2018-07-14 DIAGNOSIS — Z79899 Other long term (current) drug therapy: Secondary | ICD-10-CM | POA: Diagnosis not present

## 2018-07-14 DIAGNOSIS — R531 Weakness: Secondary | ICD-10-CM | POA: Diagnosis not present

## 2018-07-14 DIAGNOSIS — Z5111 Encounter for antineoplastic chemotherapy: Secondary | ICD-10-CM | POA: Diagnosis not present

## 2018-07-14 DIAGNOSIS — K521 Toxic gastroenteritis and colitis: Secondary | ICD-10-CM | POA: Diagnosis not present

## 2018-07-14 DIAGNOSIS — D701 Agranulocytosis secondary to cancer chemotherapy: Secondary | ICD-10-CM | POA: Diagnosis not present

## 2018-07-14 DIAGNOSIS — H538 Other visual disturbances: Secondary | ICD-10-CM | POA: Diagnosis not present

## 2018-07-14 DIAGNOSIS — Z7982 Long term (current) use of aspirin: Secondary | ICD-10-CM | POA: Diagnosis not present

## 2018-07-14 DIAGNOSIS — E871 Hypo-osmolality and hyponatremia: Secondary | ICD-10-CM | POA: Diagnosis not present

## 2018-07-14 DIAGNOSIS — D649 Anemia, unspecified: Secondary | ICD-10-CM

## 2018-07-14 DIAGNOSIS — C250 Malignant neoplasm of head of pancreas: Secondary | ICD-10-CM | POA: Diagnosis not present

## 2018-07-14 DIAGNOSIS — K219 Gastro-esophageal reflux disease without esophagitis: Secondary | ICD-10-CM | POA: Diagnosis not present

## 2018-07-14 DIAGNOSIS — E876 Hypokalemia: Secondary | ICD-10-CM

## 2018-07-14 DIAGNOSIS — D6959 Other secondary thrombocytopenia: Secondary | ICD-10-CM | POA: Diagnosis not present

## 2018-07-14 DIAGNOSIS — E039 Hypothyroidism, unspecified: Secondary | ICD-10-CM | POA: Diagnosis not present

## 2018-07-14 DIAGNOSIS — R7989 Other specified abnormal findings of blood chemistry: Secondary | ICD-10-CM | POA: Diagnosis not present

## 2018-07-14 LAB — CBC WITH DIFFERENTIAL/PLATELET
Abs Immature Granulocytes: 0.03 10*3/uL (ref 0.00–0.07)
Basophils Absolute: 0 10*3/uL (ref 0.0–0.1)
Basophils Relative: 0 %
Eosinophils Absolute: 0.1 10*3/uL (ref 0.0–0.5)
Eosinophils Relative: 2 %
HCT: 35 % — ABNORMAL LOW (ref 36.0–46.0)
Hemoglobin: 11.2 g/dL — ABNORMAL LOW (ref 12.0–15.0)
Immature Granulocytes: 0 %
Lymphocytes Relative: 13 %
Lymphs Abs: 0.9 10*3/uL (ref 0.7–4.0)
MCH: 27.2 pg (ref 26.0–34.0)
MCHC: 32 g/dL (ref 30.0–36.0)
MCV: 85 fL (ref 80.0–100.0)
Monocytes Absolute: 0.5 10*3/uL (ref 0.1–1.0)
Monocytes Relative: 8 %
Neutro Abs: 5.2 10*3/uL (ref 1.7–7.7)
Neutrophils Relative %: 77 %
Platelets: 285 10*3/uL (ref 150–400)
RBC: 4.12 MIL/uL (ref 3.87–5.11)
RDW: 14.5 % (ref 11.5–15.5)
WBC: 6.8 10*3/uL (ref 4.0–10.5)
nRBC: 0 % (ref 0.0–0.2)

## 2018-07-14 LAB — RETIC PANEL
Immature Retic Fract: 11 % (ref 2.3–15.9)
RBC.: 4.12 MIL/uL (ref 3.87–5.11)
Retic Count, Absolute: 62.6 10*3/uL (ref 19.0–186.0)
Retic Ct Pct: 1.5 % (ref 0.4–3.1)
Reticulocyte Hemoglobin: 30.4 pg (ref >27.9)

## 2018-07-14 LAB — BASIC METABOLIC PANEL
Anion gap: 9 (ref 5–15)
BUN: 14 mg/dL (ref 8–23)
CO2: 24 mmol/L (ref 22–32)
Calcium: 10.1 mg/dL (ref 8.9–10.3)
Chloride: 101 mmol/L (ref 98–111)
Creatinine, Ser: 0.99 mg/dL (ref 0.44–1.00)
GFR calc Af Amer: >60 mL/min (ref >60)
GFR calc non Af Amer: 57 mL/min — ABNORMAL LOW (ref >60)
Glucose, Bld: 126 mg/dL — ABNORMAL HIGH (ref 70–99)
Potassium: 3.8 mmol/L (ref 3.5–5.1)
Sodium: 134 mmol/L — ABNORMAL LOW (ref 135–145)

## 2018-07-14 LAB — MAGNESIUM: Magnesium: 1.7 mg/dL (ref 1.7–2.4)

## 2018-07-15 ENCOUNTER — Inpatient Hospital Stay: Payer: Medicare Other

## 2018-07-15 DIAGNOSIS — Z79899 Other long term (current) drug therapy: Secondary | ICD-10-CM | POA: Diagnosis not present

## 2018-07-15 DIAGNOSIS — E871 Hypo-osmolality and hyponatremia: Secondary | ICD-10-CM | POA: Diagnosis not present

## 2018-07-15 DIAGNOSIS — R531 Weakness: Secondary | ICD-10-CM | POA: Diagnosis not present

## 2018-07-15 DIAGNOSIS — D701 Agranulocytosis secondary to cancer chemotherapy: Secondary | ICD-10-CM | POA: Diagnosis not present

## 2018-07-15 DIAGNOSIS — E039 Hypothyroidism, unspecified: Secondary | ICD-10-CM | POA: Diagnosis not present

## 2018-07-15 DIAGNOSIS — E876 Hypokalemia: Secondary | ICD-10-CM | POA: Diagnosis not present

## 2018-07-15 DIAGNOSIS — C250 Malignant neoplasm of head of pancreas: Secondary | ICD-10-CM

## 2018-07-15 DIAGNOSIS — D6481 Anemia due to antineoplastic chemotherapy: Secondary | ICD-10-CM | POA: Diagnosis not present

## 2018-07-15 DIAGNOSIS — K30 Functional dyspepsia: Secondary | ICD-10-CM | POA: Diagnosis not present

## 2018-07-15 DIAGNOSIS — K521 Toxic gastroenteritis and colitis: Secondary | ICD-10-CM | POA: Diagnosis not present

## 2018-07-15 DIAGNOSIS — D6959 Other secondary thrombocytopenia: Secondary | ICD-10-CM | POA: Diagnosis not present

## 2018-07-15 DIAGNOSIS — Z5111 Encounter for antineoplastic chemotherapy: Secondary | ICD-10-CM | POA: Diagnosis not present

## 2018-07-15 DIAGNOSIS — T451X5S Adverse effect of antineoplastic and immunosuppressive drugs, sequela: Secondary | ICD-10-CM | POA: Diagnosis not present

## 2018-07-15 DIAGNOSIS — R42 Dizziness and giddiness: Secondary | ICD-10-CM | POA: Diagnosis not present

## 2018-07-15 DIAGNOSIS — R5383 Other fatigue: Secondary | ICD-10-CM | POA: Diagnosis not present

## 2018-07-15 DIAGNOSIS — I10 Essential (primary) hypertension: Secondary | ICD-10-CM | POA: Diagnosis not present

## 2018-07-15 DIAGNOSIS — E785 Hyperlipidemia, unspecified: Secondary | ICD-10-CM | POA: Diagnosis not present

## 2018-07-15 DIAGNOSIS — R7989 Other specified abnormal findings of blood chemistry: Secondary | ICD-10-CM | POA: Diagnosis not present

## 2018-07-15 DIAGNOSIS — H538 Other visual disturbances: Secondary | ICD-10-CM | POA: Diagnosis not present

## 2018-07-15 DIAGNOSIS — K219 Gastro-esophageal reflux disease without esophagitis: Secondary | ICD-10-CM | POA: Diagnosis not present

## 2018-07-15 DIAGNOSIS — Z7982 Long term (current) use of aspirin: Secondary | ICD-10-CM | POA: Diagnosis not present

## 2018-07-15 MED ORDER — PEGFILGRASTIM 6 MG/0.6ML ~~LOC~~ PSKT
6.0000 mg | PREFILLED_SYRINGE | Freq: Once | SUBCUTANEOUS | Status: AC
  Administered 2018-07-15: 6 mg via SUBCUTANEOUS
  Filled 2018-07-15: qty 0.6

## 2018-07-15 MED ORDER — HEPARIN SOD (PORK) LOCK FLUSH 100 UNIT/ML IV SOLN
500.0000 [IU] | Freq: Once | INTRAVENOUS | Status: AC | PRN
  Administered 2018-07-15: 500 [IU]
  Filled 2018-07-15: qty 5

## 2018-07-15 MED ORDER — SODIUM CHLORIDE 0.9% FLUSH
10.0000 mL | INTRAVENOUS | Status: DC | PRN
  Administered 2018-07-15: 10 mL
  Filled 2018-07-15: qty 10

## 2018-07-16 ENCOUNTER — Telehealth: Payer: Self-pay | Admitting: *Deleted

## 2018-07-16 ENCOUNTER — Inpatient Hospital Stay: Payer: Medicare Other

## 2018-07-16 NOTE — Telephone Encounter (Signed)
No problem.

## 2018-07-16 NOTE — Telephone Encounter (Signed)
Patient called and reports that she forgot to start her Dexamethasone yesterday , so she will take it today and tomorrow instead of yesterday and today.

## 2018-07-19 ENCOUNTER — Other Ambulatory Visit: Payer: Self-pay

## 2018-07-19 ENCOUNTER — Emergency Department
Admission: EM | Admit: 2018-07-19 | Discharge: 2018-07-19 | Disposition: A | Discharge status: Home / Self Care | Payer: Medicare Other | Attending: Emergency Medicine | Admitting: Emergency Medicine

## 2018-07-19 DIAGNOSIS — I10 Essential (primary) hypertension: Secondary | ICD-10-CM | POA: Diagnosis not present

## 2018-07-19 DIAGNOSIS — E860 Dehydration: Secondary | ICD-10-CM | POA: Diagnosis not present

## 2018-07-19 DIAGNOSIS — E039 Hypothyroidism, unspecified: Secondary | ICD-10-CM | POA: Diagnosis not present

## 2018-07-19 DIAGNOSIS — Z7982 Long term (current) use of aspirin: Secondary | ICD-10-CM | POA: Diagnosis not present

## 2018-07-19 DIAGNOSIS — R531 Weakness: Secondary | ICD-10-CM

## 2018-07-19 DIAGNOSIS — Z79899 Other long term (current) drug therapy: Secondary | ICD-10-CM | POA: Insufficient documentation

## 2018-07-19 DIAGNOSIS — Z87891 Personal history of nicotine dependence: Secondary | ICD-10-CM | POA: Insufficient documentation

## 2018-07-19 LAB — CBC
HCT: 39 % (ref 36.0–46.0)
Hemoglobin: 12.3 g/dL (ref 12.0–15.0)
MCH: 27.5 pg (ref 26.0–34.0)
MCHC: 31.5 g/dL (ref 30.0–36.0)
MCV: 87.2 fL (ref 80.0–100.0)
Platelets: 124 10*3/uL — ABNORMAL LOW (ref 150–400)
RBC: 4.47 MIL/uL (ref 3.87–5.11)
RDW: 14.1 % (ref 11.5–15.5)
WBC: 3.5 10*3/uL — ABNORMAL LOW (ref 4.0–10.5)
nRBC: 0 % (ref 0.0–0.2)

## 2018-07-19 LAB — BASIC METABOLIC PANEL
Anion gap: 10 (ref 5–15)
BUN: 25 mg/dL — ABNORMAL HIGH (ref 8–23)
CO2: 22 mmol/L (ref 22–32)
Calcium: 9.5 mg/dL (ref 8.9–10.3)
Chloride: 100 mmol/L (ref 98–111)
Creatinine, Ser: 0.89 mg/dL (ref 0.44–1.00)
GFR calc Af Amer: >60 mL/min (ref >60)
GFR calc non Af Amer: >60 mL/min (ref >60)
Glucose, Bld: 132 mg/dL — ABNORMAL HIGH (ref 70–99)
Potassium: 4.2 mmol/L (ref 3.5–5.1)
Sodium: 132 mmol/L — ABNORMAL LOW (ref 135–145)

## 2018-07-19 MED ORDER — ONDANSETRON HCL 4 MG/2ML IJ SOLN
4.0000 mg | Freq: Once | INTRAMUSCULAR | Status: AC
  Administered 2018-07-19: 4 mg via INTRAVENOUS
  Filled 2018-07-19: qty 2

## 2018-07-19 MED ORDER — SODIUM CHLORIDE 0.9 % IV BOLUS
1000.0000 mL | Freq: Once | INTRAVENOUS | Status: AC
  Administered 2018-07-19: 1000 mL via INTRAVENOUS

## 2018-07-19 MED ORDER — SODIUM CHLORIDE 0.9% FLUSH: 3.0000 mL | Freq: Once | INTRAVENOUS | Status: DC

## 2018-07-19 NOTE — ED Notes (Signed)
Full rainbow sent to lab.  

## 2018-07-19 NOTE — ED Notes (Signed)
Pt denies any recent fevers at home.

## 2018-07-19 NOTE — ED Triage Notes (Signed)
FIRST NURSE NOTE-here for vomiting. Chemo patient. Sent for possible fluids. NAD.

## 2018-07-19 NOTE — ED Triage Notes (Signed)
Pt comes via POV with c/o weakness for about a week. Pt is a chemo patient and first treatment was last Tuesday.  Family reports decreased oral intake and that pt has just been laying around.

## 2018-07-19 NOTE — ED Provider Notes (Signed)
[facility] Emergency Department Provider Note  ____________________________________________   I have reviewed the triage vital signs and the nursing notes.   HISTORY  Chief Complaint Weakness   History limited by: Not Limited   HPI Lynn Taylor is a 73 y.o. female who presents to the emergency department today because of concern for weakness and possibility of dehydration.  Patient states that she started chemotherapy last week.  She has been feeling weak since that time.  There is concerned she is been dehydrated because she has been quite nauseous.  Patient denies any associated abdominal pain with this.  She has had loose stools.  She denies any blood in her vomit or her stools.  She has not had any fevers.  Per medical record review patient has a history of Gerd, HTN  Past Medical History:  Diagnosis Date  . Cancer ([place])   . GERD (gastroesophageal reflux disease)   . Hypertension   . Hypothyroidism   . Malignant neoplasm of head of pancreas ([place]) 06/08/2018    Patient Active Problem List   Diagnosis Date Noted  . Malignant neoplasm of head of pancreas ([place]) 06/08/2018  . Goals of care, counseling/discussion 05/08/2018  . Fitting and adjustment of gastrointestinal appliance and device   . Hyponatremia 05/04/2018  . Jaundice   . Pancreas neoplasm   . Biliary tract obstruction   . Hyperbilirubinemia 04/28/2018  . Chest pain 04/11/2018  . GERD (gastroesophageal reflux disease) 04/11/2018  . HTN (hypertension), benign 05/20/2017  . Acquired hypothyroidism 05/20/2017  . Vitamin D deficiency, unspecified 05/20/2017    Past Surgical History:  Procedure Laterality Date  . CHOLECYSTECTOMY    . COLONOSCOPY N/A 12/02/2016   Procedure: COLONOSCOPY;  Surgeon: Lollie Sails, MD;  Location: [facility] ENDOSCOPY;  Service: Endoscopy;  Laterality: N/A;  . ERCP N/A 04/30/2018   Procedure: ENDOSCOPIC RETROGRADE CHOLANGIOPANCREATOGRAPHY (ERCP);  Surgeon:  Lucilla Lame, MD;  Location: [facility] ENDOSCOPY;  Service: Endoscopy;  Laterality: N/A;  . ERCP N/A 05/05/2018   Procedure: ENDOSCOPIC RETROGRADE CHOLANGIOPANCREATOGRAPHY (ERCP);  Surgeon: Lucilla Lame, MD;  Location: [facility] ENDOSCOPY;  Service: Endoscopy;  Laterality: N/A;  . PORTA CATH INSERTION N/A 06/28/2018   Procedure: PORTA CATH INSERTION;  Surgeon: Algernon Huxley, MD;  Location: [place] CV LAB;  Service: Cardiovascular;  Laterality: N/A;    Prior to Admission medications   Medication Sig Start Date End Date Taking? Authorizing Provider  aspirin EC 81 MG tablet Take 81 mg by mouth daily.    [provider]  Cholecalciferol (VITAMIN D-3) 25 MCG (1000 UT) CAPS Take by mouth.    [provider]  dexamethasone (DECADRON) 4 MG tablet Take 2 tablets (8 mg total) by mouth daily. Start the day after chemo for 2 days. 06/08/18   Earlie Server, MD  docusate sodium (COLACE) 100 MG capsule Take 100 mg by mouth 2 (two) times daily.    [provider]  levothyroxine (SYNTHROID, LEVOTHROID) 100 MCG tablet Take 1 tablet (100 mcg total) by mouth daily before breakfast. 11/19/17   Ronnell Freshwater, NP  loperamide (IMODIUM) 2 MG capsule Take 1 capsule (2 mg total) by mouth See admin instructions. With onset of loose stool, take 4mg  followed by 2mg  every 2 hours until 12 hours have passed without loose bowel movement. Maximum: 16 mg/day 07/13/18   Earlie Server, MD  ondansetron (ZOFRAN) 4 MG tablet Take 1 tablet (4 mg total) by mouth every 6 (six) hours as needed for nausea or vomiting. 05/04/18  Earlie Server, MD  potassium chloride SA (K-DUR,KLOR-CON) 20 MEQ tablet Take 2 tablets (40 mEq total) by mouth daily. 07/13/18   Earlie Server, MD  prochlorperazine (COMPAZINE) 10 MG tablet Take 1 tablet (10 mg total) by mouth every 6 (six) hours as needed (NAUSEA). 06/08/18   Earlie Server, MD  traMADol (ULTRAM) 50 MG tablet Take 1 tablet (50 mg total) by mouth every 6 (six) hours as needed. 06/29/18   Earlie Server, MD     Allergies Patient has no known allergies.  Family History  Problem Relation Age of Onset  . Diabetes Mellitus I Other   . Alcoholism Other   . Hypertension Other   . Hyperlipidemia Other   . Coronary artery disease Other   . Stroke Other   . Osteoarthritis Other   . Migraines Other   . Heart Problems Mother   . Stroke Sister   . CAD Brother   . Stroke Brother   . Breast cancer Neg Hx     Social History Social History   Tobacco Use  . Smoking status: Former Smoker    Last attempt to quit: 04/14/1988    Years since quitting: 30.2  . Smokeless tobacco: Never Used  Substance Use Topics  . Alcohol use: Yes    Comment: social drinker  . Drug use: No    Review of Systems Constitutional: No fever/chills. Positive for weakness.  Eyes: No visual changes. ENT: No sore throat. Cardiovascular: Denies chest pain. Respiratory: Denies shortness of breath. Gastrointestinal: Positive for nausea.  Genitourinary: Negative for dysuria. Musculoskeletal: Negative for back pain. Skin: Negative for rash. Neurological: Negative for headaches, focal weakness or numbness.  ____________________________________________   PHYSICAL EXAM:  VITAL SIGNS: ED Triage Vitals  Enc Vitals Group     BP 07/19/18 1719 135/75     Pulse Rate 07/19/18 1719 80     Resp 07/19/18 1719 18     Temp 07/19/18 1719 97.9 F (36.6 C)     Temp src --      SpO2 07/19/18 1719 96 %     Weight 07/19/18 1717 164 lb 4.8 oz (74.5 kg)     Height 07/19/18 1717 5\' 2"  (1.575 m)     Head Circumference --      Peak Flow --      Pain Score 07/19/18 1717 0   Constitutional: Alert and oriented.  Eyes: Conjunctivae are normal.  ENT      Head: Normocephalic and atraumatic.      Nose: No congestion/rhinnorhea.      Mouth/Throat: Mucous membranes are moist.      Neck: No stridor. Hematological/Lymphatic/Immunilogical: No cervical lymphadenopathy. Cardiovascular: Normal rate, regular rhythm.  No murmurs, rubs, or  gallops.  Respiratory: Normal respiratory effort without tachypnea nor retractions. Breath sounds are clear and equal bilaterally. No wheezes/rales/rhonchi. Gastrointestinal: Soft and non tender. No rebound. No guarding.  Genitourinary: Deferred Musculoskeletal: Normal range of motion in all extremities. No lower extremity edema. Neurologic:  Normal speech and language. No gross focal neurologic deficits are appreciated.  Skin:  Skin is warm, dry and intact. No rash noted. Psychiatric: Mood and affect are normal. Speech and behavior are normal. Patient exhibits appropriate insight and judgment.  ____________________________________________    LABS (pertinent positives/negatives)  CBC wbc 3.5, hgb 12.3, plt 124 BMP na 132, k 4.2, glu 132, cr 0.89  ____________________________________________   EKG  I, Nance Pear, attending physician, personally viewed and interpreted this EKG  EKG Time: 1731 Rate: 77 Rhythm: normal  sinus rhythm Axis: normal Intervals: qtc 409 QRS: normal ST changes: no st elevation Impression: possible left atrial enlargement otherwise normal ekg  ____________________________________________    RADIOLOGY  None  ____________________________________________   PROCEDURES  Procedures  ____________________________________________   INITIAL IMPRESSION / ASSESSMENT AND PLAN / ED COURSE  Pertinent labs & imaging results that were available during my care of the patient were reviewed by me and considered in my medical decision making (see chart for details).   Patient presented to the emergency department today because of concerns for dehydration weakness.  Patient did recently start chemotherapy and has had some nausea.  Patient's blood work without significant electrolyte abnormality or elevation of creatinine.  No concerning leukocytosis.  She did feel better after IV fluids.  I think at this time dehydration likely.  Did discuss with patient that  she should continue to try her antiemetics and focus on hydration.  ____________________________________________   FINAL CLINICAL IMPRESSION(S) / ED DIAGNOSES  Final diagnoses:  Weakness  Dehydration     Note: This dictation was prepared with Dragon dictation. Any transcriptional errors that result from this process are unintentional     Nance Pear, MD 07/19/18 2322

## 2018-07-19 NOTE — Discharge Instructions (Addendum)
Please seek medical attention for any high fevers, chest pain, shortness of breath, change in behavior, persistent vomiting, bloody stool or any other new or concerning symptoms.  

## 2018-07-20 ENCOUNTER — Inpatient Hospital Stay: Payer: Medicare Other

## 2018-07-20 ENCOUNTER — Encounter: Payer: Self-pay | Admitting: Oncology

## 2018-07-20 ENCOUNTER — Inpatient Hospital Stay (HOSPITAL_BASED_OUTPATIENT_CLINIC_OR_DEPARTMENT_OTHER): Payer: Medicare Other | Admitting: Oncology

## 2018-07-20 VITALS — BP 124/78 | HR 83 | Temp 96.5°F | Resp 18 | Wt 156.9 lb

## 2018-07-20 DIAGNOSIS — Z79899 Other long term (current) drug therapy: Secondary | ICD-10-CM | POA: Diagnosis not present

## 2018-07-20 DIAGNOSIS — K521 Toxic gastroenteritis and colitis: Secondary | ICD-10-CM | POA: Diagnosis not present

## 2018-07-20 DIAGNOSIS — C250 Malignant neoplasm of head of pancreas: Secondary | ICD-10-CM | POA: Diagnosis not present

## 2018-07-20 DIAGNOSIS — D702 Other drug-induced agranulocytosis: Secondary | ICD-10-CM

## 2018-07-20 DIAGNOSIS — E039 Hypothyroidism, unspecified: Secondary | ICD-10-CM | POA: Diagnosis not present

## 2018-07-20 DIAGNOSIS — E785 Hyperlipidemia, unspecified: Secondary | ICD-10-CM | POA: Diagnosis not present

## 2018-07-20 DIAGNOSIS — E871 Hypo-osmolality and hyponatremia: Secondary | ICD-10-CM | POA: Diagnosis not present

## 2018-07-20 DIAGNOSIS — R531 Weakness: Secondary | ICD-10-CM

## 2018-07-20 DIAGNOSIS — Z7982 Long term (current) use of aspirin: Secondary | ICD-10-CM | POA: Diagnosis not present

## 2018-07-20 DIAGNOSIS — E876 Hypokalemia: Secondary | ICD-10-CM | POA: Diagnosis not present

## 2018-07-20 DIAGNOSIS — D696 Thrombocytopenia, unspecified: Secondary | ICD-10-CM

## 2018-07-20 DIAGNOSIS — T451X5A Adverse effect of antineoplastic and immunosuppressive drugs, initial encounter: Secondary | ICD-10-CM

## 2018-07-20 DIAGNOSIS — I10 Essential (primary) hypertension: Secondary | ICD-10-CM

## 2018-07-20 DIAGNOSIS — K219 Gastro-esophageal reflux disease without esophagitis: Secondary | ICD-10-CM | POA: Diagnosis not present

## 2018-07-20 DIAGNOSIS — D6481 Anemia due to antineoplastic chemotherapy: Secondary | ICD-10-CM

## 2018-07-20 DIAGNOSIS — Z5111 Encounter for antineoplastic chemotherapy: Secondary | ICD-10-CM | POA: Diagnosis not present

## 2018-07-20 DIAGNOSIS — R5383 Other fatigue: Secondary | ICD-10-CM

## 2018-07-20 DIAGNOSIS — R42 Dizziness and giddiness: Secondary | ICD-10-CM | POA: Diagnosis not present

## 2018-07-20 DIAGNOSIS — K30 Functional dyspepsia: Secondary | ICD-10-CM | POA: Diagnosis not present

## 2018-07-20 DIAGNOSIS — T451X5S Adverse effect of antineoplastic and immunosuppressive drugs, sequela: Secondary | ICD-10-CM | POA: Diagnosis not present

## 2018-07-20 DIAGNOSIS — R7989 Other specified abnormal findings of blood chemistry: Secondary | ICD-10-CM

## 2018-07-20 DIAGNOSIS — D701 Agranulocytosis secondary to cancer chemotherapy: Secondary | ICD-10-CM | POA: Diagnosis not present

## 2018-07-20 DIAGNOSIS — Z87891 Personal history of nicotine dependence: Secondary | ICD-10-CM

## 2018-07-20 DIAGNOSIS — H538 Other visual disturbances: Secondary | ICD-10-CM | POA: Diagnosis not present

## 2018-07-20 DIAGNOSIS — D6959 Other secondary thrombocytopenia: Secondary | ICD-10-CM | POA: Diagnosis not present

## 2018-07-20 LAB — CBC WITH DIFFERENTIAL/PLATELET
Abs Immature Granulocytes: 0.02 10*3/uL (ref 0.00–0.07)
Basophils Absolute: 0 10*3/uL (ref 0.0–0.1)
Basophils Relative: 1 %
Eosinophils Absolute: 0 10*3/uL (ref 0.0–0.5)
Eosinophils Relative: 0 %
HCT: 34.4 % — ABNORMAL LOW (ref 36.0–46.0)
Hemoglobin: 11.1 g/dL — ABNORMAL LOW (ref 12.0–15.0)
Immature Granulocytes: 1 %
Lymphocytes Relative: 86 %
Lymphs Abs: 1.3 10*3/uL (ref 0.7–4.0)
MCH: 27.1 pg (ref 26.0–34.0)
MCHC: 32.3 g/dL (ref 30.0–36.0)
MCV: 83.9 fL (ref 80.0–100.0)
Monocytes Absolute: 0 10*3/uL — ABNORMAL LOW (ref 0.1–1.0)
Monocytes Relative: 1 %
Neutro Abs: 0.2 10*3/uL — ABNORMAL LOW (ref 1.7–7.7)
Neutrophils Relative %: 11 %
Platelets: 93 10*3/uL — ABNORMAL LOW (ref 150–400)
RBC: 4.1 MIL/uL (ref 3.87–5.11)
RDW: 13.9 % (ref 11.5–15.5)
WBC: 1.5 10*3/uL — ABNORMAL LOW (ref 4.0–10.5)
nRBC: 0 % (ref 0.0–0.2)

## 2018-07-20 LAB — COMPREHENSIVE METABOLIC PANEL
ALT: 26 U/L (ref 0–44)
AST: 23 U/L (ref 15–41)
Albumin: 3.8 g/dL (ref 3.5–5.0)
Alkaline Phosphatase: 60 U/L (ref 38–126)
Anion gap: 7 (ref 5–15)
BUN: 18 mg/dL (ref 8–23)
CO2: 23 mmol/L (ref 22–32)
Calcium: 9 mg/dL (ref 8.9–10.3)
Chloride: 102 mmol/L (ref 98–111)
Creatinine, Ser: 0.99 mg/dL (ref 0.44–1.00)
GFR calc Af Amer: >60 mL/min (ref >60)
GFR calc non Af Amer: 57 mL/min — ABNORMAL LOW (ref >60)
Glucose, Bld: 144 mg/dL — ABNORMAL HIGH (ref 70–99)
Potassium: 3.5 mmol/L (ref 3.5–5.1)
Sodium: 132 mmol/L — ABNORMAL LOW (ref 135–145)
Total Bilirubin: 1 mg/dL (ref 0.3–1.2)
Total Protein: 7.5 g/dL (ref 6.5–8.1)

## 2018-07-20 MED ORDER — SODIUM CHLORIDE 0.9 % IV SOLN
Freq: Once | INTRAVENOUS | Status: AC
  Administered 2018-07-20: 11:00:00 via INTRAVENOUS
  Filled 2018-07-20: qty 250

## 2018-07-20 MED ORDER — HEPARIN SOD (PORK) LOCK FLUSH 100 UNIT/ML IV SOLN
500.0000 [IU] | Freq: Once | INTRAVENOUS | Status: AC | PRN
  Administered 2018-07-20: 500 [IU]
  Filled 2018-07-20: qty 5

## 2018-07-20 MED ORDER — CHLORHEXIDINE GLUCONATE 0.12 % MT SOLN: 15.0000 mL | Freq: Two times a day (BID) | OROMUCOSAL | 0 refills | Status: DC

## 2018-07-20 MED ORDER — SODIUM CHLORIDE 0.9 % IV SOLN
Freq: Once | INTRAVENOUS | Status: AC
  Administered 2018-07-20: 11:00:00 via INTRAVENOUS
  Filled 2018-07-20: qty 100

## 2018-07-20 MED ORDER — SODIUM CHLORIDE 0.9% FLUSH
10.0000 mL | Freq: Once | INTRAVENOUS | Status: AC | PRN
  Administered 2018-07-20: 10 mL
  Filled 2018-07-20: qty 10

## 2018-07-20 MED ORDER — OMEPRAZOLE 20 MG PO CPDR: 20.0000 mg | DELAYED_RELEASE_CAPSULE | Freq: Every day | ORAL | 2 refills | Status: DC

## 2018-07-20 MED ORDER — POTASSIUM CHLORIDE 10 MEQ/100ML IV SOLN: 10.0000 meq | Freq: Once | INTRAVENOUS | Status: DC

## 2018-07-20 MED ORDER — CIPROFLOXACIN HCL 250 MG PO TABS: 250.0000 mg | ORAL_TABLET | Freq: Every day | ORAL | 0 refills | Status: DC

## 2018-07-20 NOTE — Progress Notes (Signed)
Per Almyra Free, Dr. Tasia Catchings wants pt to receive 10 mg of IV K with IVF today. Order released from supportive therapy plan.

## 2018-07-20 NOTE — Progress Notes (Signed)
Hematology/Oncology follow-up note [facility] [phone] [phone]   Patient Care Team: Jodelle Green, FNP as PCP - General (Family Medicine) Clent Jacks, RN as Registered Nurse  REFERRING PROVIDER: Evie Lacks CHIEF COMPLAINTS/REASON FOR VISIT:  Follow-up for management of pancreatic cancer.  HISTORY OF PRESENTING ILLNESS:  Lynn Taylor is a  73 y.o.  female with PMH listed below who was referred to me for evaluation of evaluation of elevated ferritin. Patient recently had lab work-up done on 04/09/2018 which showed ferritin level 1877, iron 97, TIBC 340, iron saturation 29, 04/11/2018 CBC showed hemoglobin 13.2, MCV 81, WBC 7.6, platelet counts 246,000. Patient was referred to hematology for further evaluation of elevated ferritin.  Patient reports recent hospitalization from 04/11/2018 to 04/12/2018.  She presented emergency room with complaints of chest pain.  Ruled out ACS.  Patient also had a recent UTI infection and has been on Macrobid. Denies any family history of hereditary hemochromatosis. Denies shortness of breath Denies joint pain. Reports feeling well today.  INTERVAL HISTORY Lynn Taylor is a 73 y.o. female who has above history reviewed by me today presents for follow up visit for management of newly diagnosed pancreatic cancer. Patient was last seen by me on 04/14/2018 for elevated ferritin. Ultrasound of liver was obtained which showed CBD and intrahepatic biliary dilatation Patient wa she is s advised to go to emergency room for further evaluation. Also had hyperbilirubinemia.  Patient was complaining about being jaundiced, pruritus all over. MR abdomen MRCP with and without contrast showed market biliary duct dilatation, secondary to obstruction in the region of pancreatic head.  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 No abdominal adenopathy, liver  metastasis or cross vascular involvement.  patient was evaluated by gastroenterology and status post ERCP with stenting. CA 19.9 1173 CEA 3.9 Denies Patient was accompanied by daughter today.  She reports that jaundice and pruritus have improved.  Any abdominal pain.  Reports feeling very fatigued. Decreased appetite.  Feels mild nausea when she eats.  Reports not drinking much fluid.  # 05/25/2018  patient was referred to [facility] and s/p whipple resection.Her postop course was c/b Type A pancreatic leak and c.diff. Pathology showed A. Hepatic artery lymph node, excision: One lymph node, negative for malignancy (0/1). B. Biliary stent, removal: Medical device, consistent with stent, gross examination only. C. 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 All margins are negative (closest margin=uncinate, 0.5 mm) Metastatic adenocarcinoma in one of thirty-two lymph nodes (1/32).  Portion of stomach and duodenum with no specific pathologic diagnosis.   Grade, 3 poorly differentiated.  Pathologic pT2 pN1   INTERVAL HISTORY Lynn Taylor is a 73 y.o. female who has above history reviewed by me today presents for follow up visit for evaluation prior to adjuvant chemotherapy for treatment of pancreatic cancer. Status post cycle 1 FOLFIRINOX with G-CSF support/onpro.  Today's day 8 of cycle 1. Patient reports feeling weak and she went to emergency room for evaluation yesterday.  Patient also has some nausea.  Blood work did not show any electrolyte abnormality or elevation of creatinine.  She felt better after IV fluid hydration.  Patient was discharged with outpatient follow-up. Patient denies any fever, chills, abdominal pain, chest pain.  Feels weak today. Denies any bone pain after Onpro  No concerns of Mediport site. Reports that she has on average 1 episode  of  loose stool per day.  No nausea vomiting.  She has not used Imodium as instructed. Is able to eat and drink little bit.  Appetite is poor.  Mild indigestion.   Review of Systems  Constitutional: Positive for fatigue. Negative for appetite change, chills and fever.  HENT:   Negative for hearing loss and voice change.   Eyes: Negative for eye problems.  Respiratory: Negative for chest tightness and cough.   Cardiovascular: Negative for chest pain.  Gastrointestinal: Positive for diarrhea. Negative for abdominal distention, abdominal pain, blood in stool and nausea.  Endocrine: Negative for hot flashes.  Genitourinary: Negative for difficulty urinating, frequency and hematuria.   Musculoskeletal: Negative for arthralgias.  Skin: Negative for itching and rash.  Neurological: Negative for extremity weakness.  Hematological: Negative for adenopathy. Does not bruise/bleed easily.  Psychiatric/Behavioral: Negative for confusion.     MEDICAL HISTORY:  Past Medical History:  Diagnosis Date  . Cancer ([place])   . GERD (gastroesophageal reflux disease)   . Hypertension   . Hypothyroidism   . Malignant neoplasm of head of pancreas ([place]) 06/08/2018    SURGICAL HISTORY: Past Surgical History:  Procedure Laterality Date  . CHOLECYSTECTOMY    . COLONOSCOPY N/A 12/02/2016   Procedure: COLONOSCOPY;  Surgeon: Lollie Sails, MD;  Location: [facility] ENDOSCOPY;  Service: Endoscopy;  Laterality: N/A;  . ERCP N/A 04/30/2018   Procedure: ENDOSCOPIC RETROGRADE CHOLANGIOPANCREATOGRAPHY (ERCP);  Surgeon: Lucilla Lame, MD;  Location: [facility] ENDOSCOPY;  Service: Endoscopy;  Laterality: N/A;  . ERCP N/A 05/05/2018   Procedure: ENDOSCOPIC RETROGRADE CHOLANGIOPANCREATOGRAPHY (ERCP);  Surgeon: Lucilla Lame, MD;  Location: [facility] ENDOSCOPY;  Service: Endoscopy;  Laterality: N/A;  . PORTA CATH INSERTION N/A 06/28/2018   Procedure: PORTA CATH INSERTION;  Surgeon: Algernon Huxley, MD;  Location: [place] CV LAB;   Service: Cardiovascular;  Laterality: N/A;    SOCIAL HISTORY: Social History   Socioeconomic History  . Marital status: Married    Spouse name: Not on file  . Number of children: Not on file  . Years of education: Not on file  . Highest education level: Not on file  Occupational History  . Not on file  Social Needs  . Financial resource strain: Not on file  . Food insecurity:    Worry: Not on file    Inability: Not on file  . Transportation needs:    Medical: Not on file    Non-medical: Not on file  Tobacco Use  . Smoking status: Former Smoker    Last attempt to quit: 04/14/1988    Years since quitting: 30.2  . Smokeless tobacco: Never Used  Substance and Sexual Activity  . Alcohol use: Yes    Comment: social drinker  . Drug use: No  . Sexual activity: Not Currently  Lifestyle  . Physical activity:    Days per week: Not on file    Minutes per session: Not on file  . Stress: Not on file  Relationships  . Social connections:    Talks on phone: Not on file    Gets together: Not on file    Attends religious service: Not on file    Active member of club or organization: Not on file    Attends meetings of clubs or organizations: Not on file    Relationship status: Not on file  . Intimate partner violence:    Fear of current or ex partner: Not on file    Emotionally abused: Not on file    Physically abused:  Not on file    Forced sexual activity: Not on file  Other Topics Concern  . Not on file  Social History Narrative  . Not on file    FAMILY HISTORY: Family History  Problem Relation Age of Onset  . Diabetes Mellitus I Other   . Alcoholism Other   . Hypertension Other   . Hyperlipidemia Other   . Coronary artery disease Other   . Stroke Other   . Osteoarthritis Other   . Migraines Other   . Heart Problems Mother   . Stroke Sister   . CAD Brother   . Stroke Brother   . Breast cancer Neg Hx     ALLERGIES:  has No Known Allergies.  MEDICATIONS:    Current Outpatient Medications  Medication Sig Dispense Refill  . aspirin EC 81 MG tablet Take 81 mg by mouth daily.    . Cholecalciferol (VITAMIN D-3) 25 MCG (1000 UT) CAPS Take by mouth.    . dexamethasone (DECADRON) 4 MG tablet Take 2 tablets (8 mg total) by mouth daily. Start the day after chemo for 2 days. 8 tablet 5  . levothyroxine (SYNTHROID, LEVOTHROID) 100 MCG tablet Take 1 tablet (100 mcg total) by mouth daily before breakfast. 90 tablet 4  . loperamide (IMODIUM) 2 MG capsule Take 1 capsule (2 mg total) by mouth See admin instructions. With onset of loose stool, take 4mg  followed by 2mg  every 2 hours until 12 hours have passed without loose bowel movement. Maximum: 16 mg/day 120 capsule 1  . ondansetron (ZOFRAN) 4 MG tablet Take 1 tablet (4 mg total) by mouth every 6 (six) hours as needed for nausea or vomiting. 30 tablet 3  . potassium chloride SA (K-DUR,KLOR-CON) 20 MEQ tablet Take 2 tablets (40 mEq total) by mouth daily. 14 tablet 0  . prochlorperazine (COMPAZINE) 10 MG tablet Take 1 tablet (10 mg total) by mouth every 6 (six) hours as needed (NAUSEA). 30 tablet 1  . traMADol (ULTRAM) 50 MG tablet Take 1 tablet (50 mg total) by mouth every 6 (six) hours as needed. 30 tablet 0  . chlorhexidine (PERIDEX) 0.12 % solution Use as directed 15 mLs in the mouth or throat 2 (two) times daily. 473 mL 0  . ciprofloxacin (CIPRO) 250 MG tablet Take 1 tablet (250 mg total) by mouth daily. 10 tablet 0  . omeprazole (PRILOSEC) 20 MG capsule Take 1 capsule (20 mg total) by mouth daily. 30 capsule 2   No current facility-administered medications for this visit.      PHYSICAL EXAMINATION: ECOG PERFORMANCE STATUS: 0 - Asymptomatic Vitals:   07/20/18 0935  BP: 124/78  Pulse: 83  Resp: 18  Temp: (!) 96.5 F (35.8 C)   Filed Weights   07/20/18 0935  Weight: 156 lb 14.4 oz (71.2 kg)    Physical Exam Constitutional:      General: She is not in acute distress. HENT:     Head:  Normocephalic and atraumatic.     Mouth/Throat:     Comments: No thrush Eyes:     General: No scleral icterus.    Pupils: Pupils are equal, round, and reactive to light.  Neck:     Musculoskeletal: Normal range of motion and neck supple.  Cardiovascular:     Rate and Rhythm: Normal rate and regular rhythm.     Heart sounds: Normal heart sounds.  Pulmonary:     Effort: Pulmonary effort is normal. No respiratory distress.     Breath sounds: No  wheezing.  Abdominal:     General: Bowel sounds are normal. There is no distension.     Palpations: Abdomen is soft. There is no mass.     Tenderness: There is no abdominal tenderness.  Musculoskeletal: Normal range of motion.        General: No deformity.  Skin:    General: Skin is warm and dry.     Findings: No erythema or rash.  Neurological:     Mental Status: She is alert and oriented to person, place, and time.     Cranial Nerves: No cranial nerve deficit.     Coordination: Coordination normal.  Psychiatric:        Behavior: Behavior normal.        Thought Content: Thought content normal.      LABORATORY DATA:  I have reviewed the data as listed Lab Results  Component Value Date   WBC 1.5 (L) 07/20/2018   HGB 11.1 (L) 07/20/2018   HCT 34.4 (L) 07/20/2018   MCV 83.9 07/20/2018   PLT 93 (L) 07/20/2018   Recent Labs    04/21/18 1046  04/29/18 0612  05/10/18 1429  07/06/18 0950 07/13/18 0825  07/14/18 1047 07/19/18 1726 07/20/18 0911  NA 137   < > 136   < >  --    < > 135 137   < > 134* 132* 132*  K 3.8   < > 3.3*   < >  --    < > 3.7 3.5   < > 3.8 4.2 3.5  CL 100   < > 108   < >  --    < > 102 105   < > 101 100 102  CO2 22   < > 23   < >  --    < > 23 23   < > 24 22 23   GLUCOSE 95   < > 101*   < >  --    < > 132* 133*   < > 126* 132* 144*  BUN 23   < > 9   < >  --    < > 15 15   < > 14 25* 18  CREATININE 1.11*   < > 0.49   < >  --    < > 0.88 0.73   < > 0.99 0.89 0.99  CALCIUM 10.6*   < > 9.2   < >  --    < > 9.9  9.5   < > 10.1 9.5 9.0  GFRNONAA 50*   < > >60   < >  --    < > >60 >60   < > 57* >60 57*  GFRAA 57*   < > >60   < >  --    < > >60 >60   < > >60 >60 >60  PROT 7.7   < >  --    < > 7.3   < > 8.3* 7.7  --   --   --  7.5  ALBUMIN 3.9   < >  --    < > 3.8   < > 4.2 3.9  --   --   --  3.8  AST 61*   < >  --    < > 94*   < > 30 20  --   --   --  23  ALT 121*   < >  --    < > 80*   < >  13 13  --   --   --  26  ALKPHOS 275*   < >  --    < > 200*   < > 64 61  --   --   --  60  BILITOT 11.7*   < > 13.7*   < > 6.2*   < > 1.0 0.6  --   --   --  1.0  BILIDIR 8.84*  --  8.2*  --  4.58*  --   --   --   --   --   --   --    < > = values in this interval not displayed.   Iron/TIBC/Ferritin/ %Sat    Component Value Date/Time   IRON 77 05/04/2018 0831   IRON 97 04/09/2018 1440   TIBC 320 05/04/2018 0831   TIBC 340 04/09/2018 1440   FERRITIN 496 (H) 05/04/2018 0831   FERRITIN 1,877 (H) 04/09/2018 1440   IRONPCTSAT 24 05/04/2018 0831   IRONPCTSAT 29 04/09/2018 1440     post op CA 19.9 is 21.    ASSESSMENT & PLAN:  1. Malignant neoplasm of head of pancreas ([place])   2. Chemotherapy induced diarrhea   3. Drug-induced neutropenia ([facility])   4. Thrombocytopenia ([place])   5. Anemia due to antineoplastic chemotherapy    # pT2 pN1 M0, Stage IIB pancreatic cancer S/p whipple resection.  Status post 1 cycle of FOLFIRINOX She had some GI toxicities including nausea, vomiting, diarrhea during irinotecan infusion and end up only receiving half of the planned dose.  #Chemotherapy induced diarrhea, advised patient to utilize Imodium as instructed.  We will proceed with 1 L of normal saline for hydration.  #Indigestion, recommend patient to start omeprazole 20 mg daily.  Prescription sent to pharmacy. Onpro 6 mg. Continue close monitoring.  Advised patient to start take Cipro 250 mg daily for prophylaxis. Repeat CBC in 3 days.  #Anemia, secondary to chemotherapy.  Continue to monitor. #Grade 1 thrombocytopenia,  due to chemotherapy. On Aspirin 81mg . Close monitor. Repeat CBC in 3 days.   We spent sufficient time to discuss many aspect of care, questions were answered to patient's satisfaction. Total face to face encounter time for this patient visit was 25 min. >50% of the time was  spent in counseling and coordination of care.   Earlie Server, MD, PhD Hematology Oncology [facility] at [facility] Pager- [phone] 07/20/2018

## 2018-07-20 NOTE — Progress Notes (Signed)
Patient here for follow up. Pt complains of nausea and loose stools. States she went to ER last night and they gave her fluids.

## 2018-07-21 ENCOUNTER — Inpatient Hospital Stay: Payer: Medicare Other

## 2018-07-21 VITALS — BP 120/77 | HR 78 | Temp 98.8°F | Resp 20

## 2018-07-21 DIAGNOSIS — I10 Essential (primary) hypertension: Secondary | ICD-10-CM | POA: Diagnosis not present

## 2018-07-21 DIAGNOSIS — E871 Hypo-osmolality and hyponatremia: Secondary | ICD-10-CM

## 2018-07-21 DIAGNOSIS — Z79899 Other long term (current) drug therapy: Secondary | ICD-10-CM | POA: Diagnosis not present

## 2018-07-21 DIAGNOSIS — R42 Dizziness and giddiness: Secondary | ICD-10-CM | POA: Diagnosis not present

## 2018-07-21 DIAGNOSIS — D6959 Other secondary thrombocytopenia: Secondary | ICD-10-CM | POA: Diagnosis not present

## 2018-07-21 DIAGNOSIS — E039 Hypothyroidism, unspecified: Secondary | ICD-10-CM | POA: Diagnosis not present

## 2018-07-21 DIAGNOSIS — R5383 Other fatigue: Secondary | ICD-10-CM | POA: Diagnosis not present

## 2018-07-21 DIAGNOSIS — D6481 Anemia due to antineoplastic chemotherapy: Secondary | ICD-10-CM | POA: Diagnosis not present

## 2018-07-21 DIAGNOSIS — H538 Other visual disturbances: Secondary | ICD-10-CM | POA: Diagnosis not present

## 2018-07-21 DIAGNOSIS — K219 Gastro-esophageal reflux disease without esophagitis: Secondary | ICD-10-CM | POA: Diagnosis not present

## 2018-07-21 DIAGNOSIS — E785 Hyperlipidemia, unspecified: Secondary | ICD-10-CM | POA: Diagnosis not present

## 2018-07-21 DIAGNOSIS — T451X5S Adverse effect of antineoplastic and immunosuppressive drugs, sequela: Secondary | ICD-10-CM | POA: Diagnosis not present

## 2018-07-21 DIAGNOSIS — E876 Hypokalemia: Secondary | ICD-10-CM | POA: Diagnosis not present

## 2018-07-21 DIAGNOSIS — Z5111 Encounter for antineoplastic chemotherapy: Secondary | ICD-10-CM | POA: Diagnosis not present

## 2018-07-21 DIAGNOSIS — R7989 Other specified abnormal findings of blood chemistry: Secondary | ICD-10-CM | POA: Diagnosis not present

## 2018-07-21 DIAGNOSIS — K30 Functional dyspepsia: Secondary | ICD-10-CM | POA: Diagnosis not present

## 2018-07-21 DIAGNOSIS — D701 Agranulocytosis secondary to cancer chemotherapy: Secondary | ICD-10-CM | POA: Diagnosis not present

## 2018-07-21 DIAGNOSIS — C250 Malignant neoplasm of head of pancreas: Secondary | ICD-10-CM | POA: Diagnosis not present

## 2018-07-21 DIAGNOSIS — Z7982 Long term (current) use of aspirin: Secondary | ICD-10-CM | POA: Diagnosis not present

## 2018-07-21 DIAGNOSIS — R531 Weakness: Secondary | ICD-10-CM | POA: Diagnosis not present

## 2018-07-21 DIAGNOSIS — K521 Toxic gastroenteritis and colitis: Secondary | ICD-10-CM | POA: Diagnosis not present

## 2018-07-21 MED ORDER — HEPARIN SOD (PORK) LOCK FLUSH 100 UNIT/ML IV SOLN
500.0000 [IU] | Freq: Once | INTRAVENOUS | Status: AC | PRN
  Administered 2018-07-21: 500 [IU]
  Filled 2018-07-21: qty 5

## 2018-07-21 MED ORDER — SODIUM CHLORIDE 0.9% FLUSH
10.0000 mL | Freq: Once | INTRAVENOUS | Status: AC | PRN
  Administered 2018-07-21: 10 mL
  Filled 2018-07-21: qty 10

## 2018-07-21 MED ORDER — SODIUM CHLORIDE 0.9 % IV SOLN
Freq: Once | INTRAVENOUS | Status: AC
  Administered 2018-07-21: 11:00:00 via INTRAVENOUS
  Filled 2018-07-21: qty 250

## 2018-07-22 ENCOUNTER — Other Ambulatory Visit: Payer: Self-pay | Admitting: Oncology

## 2018-07-22 ENCOUNTER — Other Ambulatory Visit: Payer: Self-pay | Admitting: *Deleted

## 2018-07-22 ENCOUNTER — Inpatient Hospital Stay: Payer: Medicare Other

## 2018-07-22 VITALS — BP 123/79 | HR 82 | Temp 96.7°F | Resp 18

## 2018-07-22 DIAGNOSIS — K521 Toxic gastroenteritis and colitis: Secondary | ICD-10-CM | POA: Diagnosis not present

## 2018-07-22 DIAGNOSIS — D6959 Other secondary thrombocytopenia: Secondary | ICD-10-CM | POA: Diagnosis not present

## 2018-07-22 DIAGNOSIS — D6481 Anemia due to antineoplastic chemotherapy: Secondary | ICD-10-CM | POA: Diagnosis not present

## 2018-07-22 DIAGNOSIS — E871 Hypo-osmolality and hyponatremia: Secondary | ICD-10-CM | POA: Diagnosis not present

## 2018-07-22 DIAGNOSIS — Z7982 Long term (current) use of aspirin: Secondary | ICD-10-CM | POA: Diagnosis not present

## 2018-07-22 DIAGNOSIS — E785 Hyperlipidemia, unspecified: Secondary | ICD-10-CM | POA: Diagnosis not present

## 2018-07-22 DIAGNOSIS — H538 Other visual disturbances: Secondary | ICD-10-CM | POA: Diagnosis not present

## 2018-07-22 DIAGNOSIS — T451X5S Adverse effect of antineoplastic and immunosuppressive drugs, sequela: Secondary | ICD-10-CM | POA: Diagnosis not present

## 2018-07-22 DIAGNOSIS — K219 Gastro-esophageal reflux disease without esophagitis: Secondary | ICD-10-CM | POA: Diagnosis not present

## 2018-07-22 DIAGNOSIS — Z5111 Encounter for antineoplastic chemotherapy: Secondary | ICD-10-CM | POA: Diagnosis not present

## 2018-07-22 DIAGNOSIS — E039 Hypothyroidism, unspecified: Secondary | ICD-10-CM | POA: Diagnosis not present

## 2018-07-22 DIAGNOSIS — R42 Dizziness and giddiness: Secondary | ICD-10-CM | POA: Diagnosis not present

## 2018-07-22 DIAGNOSIS — K30 Functional dyspepsia: Secondary | ICD-10-CM | POA: Diagnosis not present

## 2018-07-22 DIAGNOSIS — E876 Hypokalemia: Secondary | ICD-10-CM | POA: Diagnosis not present

## 2018-07-22 DIAGNOSIS — C250 Malignant neoplasm of head of pancreas: Secondary | ICD-10-CM

## 2018-07-22 DIAGNOSIS — I10 Essential (primary) hypertension: Secondary | ICD-10-CM | POA: Diagnosis not present

## 2018-07-22 DIAGNOSIS — R531 Weakness: Secondary | ICD-10-CM | POA: Diagnosis not present

## 2018-07-22 DIAGNOSIS — Z79899 Other long term (current) drug therapy: Secondary | ICD-10-CM | POA: Diagnosis not present

## 2018-07-22 DIAGNOSIS — R7989 Other specified abnormal findings of blood chemistry: Secondary | ICD-10-CM | POA: Diagnosis not present

## 2018-07-22 DIAGNOSIS — R5383 Other fatigue: Secondary | ICD-10-CM | POA: Diagnosis not present

## 2018-07-22 DIAGNOSIS — D701 Agranulocytosis secondary to cancer chemotherapy: Secondary | ICD-10-CM | POA: Diagnosis not present

## 2018-07-22 LAB — COMPREHENSIVE METABOLIC PANEL
ALT: 17 U/L (ref 0–44)
AST: 15 U/L (ref 15–41)
Albumin: 3.5 g/dL (ref 3.5–5.0)
Alkaline Phosphatase: 52 U/L (ref 38–126)
Anion gap: 8 (ref 5–15)
BUN: 8 mg/dL (ref 8–23)
CO2: 23 mmol/L (ref 22–32)
Calcium: 8.7 mg/dL — ABNORMAL LOW (ref 8.9–10.3)
Chloride: 97 mmol/L — ABNORMAL LOW (ref 98–111)
Creatinine, Ser: 0.79 mg/dL (ref 0.44–1.00)
GFR calc Af Amer: >60 mL/min (ref >60)
GFR calc non Af Amer: >60 mL/min (ref >60)
Glucose, Bld: 139 mg/dL — ABNORMAL HIGH (ref 70–99)
Potassium: 2.8 mmol/L — ABNORMAL LOW (ref 3.5–5.1)
Sodium: 128 mmol/L — ABNORMAL LOW (ref 135–145)
Total Bilirubin: 1.2 mg/dL (ref 0.3–1.2)
Total Protein: 7.2 g/dL (ref 6.5–8.1)

## 2018-07-22 LAB — CBC WITH DIFFERENTIAL/PLATELET
Abs Immature Granulocytes: 0 10*3/uL (ref 0.00–0.07)
Basophils Absolute: 0 10*3/uL (ref 0.0–0.1)
Basophils Relative: 0 %
Eosinophils Absolute: 0 10*3/uL (ref 0.0–0.5)
Eosinophils Relative: 0 %
HCT: 30 % — ABNORMAL LOW (ref 36.0–46.0)
Hemoglobin: 9.7 g/dL — ABNORMAL LOW (ref 12.0–15.0)
Immature Granulocytes: 0 %
Lymphocytes Relative: 97 %
Lymphs Abs: 0.7 10*3/uL (ref 0.7–4.0)
MCH: 26.9 pg (ref 26.0–34.0)
MCHC: 32.3 g/dL (ref 30.0–36.0)
MCV: 83.3 fL (ref 80.0–100.0)
Monocytes Absolute: 0 10*3/uL — ABNORMAL LOW (ref 0.1–1.0)
Monocytes Relative: 2 %
Neutro Abs: 0 10*3/uL — ABNORMAL LOW (ref 1.7–7.7)
Neutrophils Relative %: 1 %
Platelets: 46 10*3/uL — ABNORMAL LOW (ref 150–400)
RBC: 3.6 MIL/uL — ABNORMAL LOW (ref 3.87–5.11)
RDW: 13.6 % (ref 11.5–15.5)
WBC: 0.7 10*3/uL — CL (ref 4.0–10.5)
nRBC: 0 % (ref 0.0–0.2)

## 2018-07-22 LAB — MAGNESIUM: Magnesium: 1.7 mg/dL (ref 1.7–2.4)

## 2018-07-22 MED ORDER — POTASSIUM CHLORIDE CRYS ER 20 MEQ PO TBCR: 40.0000 meq | EXTENDED_RELEASE_TABLET | Freq: Every day | ORAL | 0 refills | Status: DC

## 2018-07-22 MED ORDER — POTASSIUM CHLORIDE 20 MEQ/100ML IV SOLN
20.0000 meq | Freq: Once | INTRAVENOUS | Status: AC
  Administered 2018-07-22: 20 meq via INTRAVENOUS

## 2018-07-22 MED ORDER — SODIUM CHLORIDE 0.9 % IV SOLN
Freq: Once | INTRAVENOUS | Status: AC
  Administered 2018-07-22: 11:00:00 via INTRAVENOUS
  Filled 2018-07-22: qty 250

## 2018-07-22 MED ORDER — SODIUM CHLORIDE 0.9% FLUSH
10.0000 mL | Freq: Once | INTRAVENOUS | Status: AC | PRN
  Administered 2018-07-22: 10 mL
  Filled 2018-07-22: qty 10

## 2018-07-22 MED ORDER — HEPARIN SOD (PORK) LOCK FLUSH 100 UNIT/ML IV SOLN
500.0000 [IU] | Freq: Once | INTRAVENOUS | Status: AC | PRN
  Administered 2018-07-22: 500 [IU]
  Filled 2018-07-22: qty 5

## 2018-07-22 NOTE — Addendum Note (Signed)
Addended by: Earlie Server on: 07/22/2018 11:48 AM   Modules accepted: Orders

## 2018-07-23 ENCOUNTER — Inpatient Hospital Stay: Payer: Medicare Other

## 2018-07-23 ENCOUNTER — Other Ambulatory Visit: Payer: Self-pay | Admitting: Oncology

## 2018-07-23 ENCOUNTER — Other Ambulatory Visit: Payer: Self-pay | Admitting: *Deleted

## 2018-07-23 VITALS — BP 127/78 | HR 75 | Temp 97.2°F | Resp 20

## 2018-07-23 DIAGNOSIS — C250 Malignant neoplasm of head of pancreas: Secondary | ICD-10-CM | POA: Diagnosis not present

## 2018-07-23 DIAGNOSIS — Z7982 Long term (current) use of aspirin: Secondary | ICD-10-CM | POA: Diagnosis not present

## 2018-07-23 DIAGNOSIS — K30 Functional dyspepsia: Secondary | ICD-10-CM | POA: Diagnosis not present

## 2018-07-23 DIAGNOSIS — E871 Hypo-osmolality and hyponatremia: Secondary | ICD-10-CM | POA: Diagnosis not present

## 2018-07-23 DIAGNOSIS — R5383 Other fatigue: Secondary | ICD-10-CM | POA: Diagnosis not present

## 2018-07-23 DIAGNOSIS — E785 Hyperlipidemia, unspecified: Secondary | ICD-10-CM | POA: Diagnosis not present

## 2018-07-23 DIAGNOSIS — R42 Dizziness and giddiness: Secondary | ICD-10-CM | POA: Diagnosis not present

## 2018-07-23 DIAGNOSIS — K521 Toxic gastroenteritis and colitis: Secondary | ICD-10-CM | POA: Diagnosis not present

## 2018-07-23 DIAGNOSIS — E039 Hypothyroidism, unspecified: Secondary | ICD-10-CM | POA: Diagnosis not present

## 2018-07-23 DIAGNOSIS — Z79899 Other long term (current) drug therapy: Secondary | ICD-10-CM | POA: Diagnosis not present

## 2018-07-23 DIAGNOSIS — R7989 Other specified abnormal findings of blood chemistry: Secondary | ICD-10-CM | POA: Diagnosis not present

## 2018-07-23 DIAGNOSIS — D6481 Anemia due to antineoplastic chemotherapy: Secondary | ICD-10-CM | POA: Diagnosis not present

## 2018-07-23 DIAGNOSIS — Z5111 Encounter for antineoplastic chemotherapy: Secondary | ICD-10-CM | POA: Diagnosis not present

## 2018-07-23 DIAGNOSIS — T451X5S Adverse effect of antineoplastic and immunosuppressive drugs, sequela: Secondary | ICD-10-CM | POA: Diagnosis not present

## 2018-07-23 DIAGNOSIS — D6959 Other secondary thrombocytopenia: Secondary | ICD-10-CM | POA: Diagnosis not present

## 2018-07-23 DIAGNOSIS — H538 Other visual disturbances: Secondary | ICD-10-CM | POA: Diagnosis not present

## 2018-07-23 DIAGNOSIS — D701 Agranulocytosis secondary to cancer chemotherapy: Secondary | ICD-10-CM | POA: Diagnosis not present

## 2018-07-23 DIAGNOSIS — K219 Gastro-esophageal reflux disease without esophagitis: Secondary | ICD-10-CM | POA: Diagnosis not present

## 2018-07-23 DIAGNOSIS — E876 Hypokalemia: Secondary | ICD-10-CM

## 2018-07-23 DIAGNOSIS — I10 Essential (primary) hypertension: Secondary | ICD-10-CM | POA: Diagnosis not present

## 2018-07-23 DIAGNOSIS — R531 Weakness: Secondary | ICD-10-CM | POA: Diagnosis not present

## 2018-07-23 LAB — BASIC METABOLIC PANEL
Anion gap: 7 (ref 5–15)
BUN: 6 mg/dL — ABNORMAL LOW (ref 8–23)
CO2: 24 mmol/L (ref 22–32)
Calcium: 8.6 mg/dL — ABNORMAL LOW (ref 8.9–10.3)
Chloride: 98 mmol/L (ref 98–111)
Creatinine, Ser: 0.68 mg/dL (ref 0.44–1.00)
GFR calc Af Amer: >60 mL/min (ref >60)
GFR calc non Af Amer: >60 mL/min (ref >60)
Glucose, Bld: 142 mg/dL — ABNORMAL HIGH (ref 70–99)
Potassium: 3.3 mmol/L — ABNORMAL LOW (ref 3.5–5.1)
Sodium: 129 mmol/L — ABNORMAL LOW (ref 135–145)

## 2018-07-23 LAB — MAGNESIUM: Magnesium: 1.5 mg/dL — ABNORMAL LOW (ref 1.7–2.4)

## 2018-07-23 MED ORDER — SODIUM CHLORIDE 0.9 % IV SOLN
2.0000 g | Freq: Once | INTRAVENOUS | Status: DC
  Filled 2018-07-23: qty 4

## 2018-07-23 MED ORDER — SODIUM CHLORIDE 0.9 % IV SOLN
Freq: Once | INTRAVENOUS | Status: DC
  Filled 2018-07-23: qty 250

## 2018-07-23 MED ORDER — SODIUM CHLORIDE 0.9 % IV SOLN
Freq: Once | INTRAVENOUS | Status: AC
  Administered 2018-07-23: 12:00:00 via INTRAVENOUS
  Filled 2018-07-23: qty 1000

## 2018-07-23 MED ORDER — POTASSIUM CHLORIDE 20 MEQ/100ML IV SOLN: 20.0000 meq | Freq: Once | INTRAVENOUS | Status: DC

## 2018-07-23 MED ORDER — SODIUM CHLORIDE 0.9% FLUSH
10.0000 mL | Freq: Once | INTRAVENOUS | Status: DC | PRN
  Filled 2018-07-23: qty 10

## 2018-07-23 MED ORDER — HEPARIN SOD (PORK) LOCK FLUSH 100 UNIT/ML IV SOLN
500.0000 [IU] | Freq: Once | INTRAVENOUS | Status: AC | PRN
  Administered 2018-07-23: 500 [IU]
  Filled 2018-07-23: qty 5

## 2018-07-26 ENCOUNTER — Inpatient Hospital Stay: Payer: Medicare Other

## 2018-07-26 ENCOUNTER — Other Ambulatory Visit: Payer: Self-pay | Admitting: *Deleted

## 2018-07-26 VITALS — BP 128/76 | HR 76 | Temp 97.8°F | Resp 18

## 2018-07-26 DIAGNOSIS — K521 Toxic gastroenteritis and colitis: Secondary | ICD-10-CM | POA: Diagnosis not present

## 2018-07-26 DIAGNOSIS — H538 Other visual disturbances: Secondary | ICD-10-CM | POA: Diagnosis not present

## 2018-07-26 DIAGNOSIS — C250 Malignant neoplasm of head of pancreas: Secondary | ICD-10-CM

## 2018-07-26 DIAGNOSIS — I10 Essential (primary) hypertension: Secondary | ICD-10-CM | POA: Diagnosis not present

## 2018-07-26 DIAGNOSIS — E871 Hypo-osmolality and hyponatremia: Secondary | ICD-10-CM

## 2018-07-26 DIAGNOSIS — E039 Hypothyroidism, unspecified: Secondary | ICD-10-CM | POA: Diagnosis not present

## 2018-07-26 DIAGNOSIS — T451X5S Adverse effect of antineoplastic and immunosuppressive drugs, sequela: Secondary | ICD-10-CM | POA: Diagnosis not present

## 2018-07-26 DIAGNOSIS — Z79899 Other long term (current) drug therapy: Secondary | ICD-10-CM | POA: Diagnosis not present

## 2018-07-26 DIAGNOSIS — R42 Dizziness and giddiness: Secondary | ICD-10-CM | POA: Diagnosis not present

## 2018-07-26 DIAGNOSIS — R5383 Other fatigue: Secondary | ICD-10-CM | POA: Diagnosis not present

## 2018-07-26 DIAGNOSIS — D6481 Anemia due to antineoplastic chemotherapy: Secondary | ICD-10-CM | POA: Diagnosis not present

## 2018-07-26 DIAGNOSIS — D6959 Other secondary thrombocytopenia: Secondary | ICD-10-CM | POA: Diagnosis not present

## 2018-07-26 DIAGNOSIS — D701 Agranulocytosis secondary to cancer chemotherapy: Secondary | ICD-10-CM | POA: Diagnosis not present

## 2018-07-26 DIAGNOSIS — Z5111 Encounter for antineoplastic chemotherapy: Secondary | ICD-10-CM | POA: Diagnosis not present

## 2018-07-26 DIAGNOSIS — R531 Weakness: Secondary | ICD-10-CM | POA: Diagnosis not present

## 2018-07-26 DIAGNOSIS — K219 Gastro-esophageal reflux disease without esophagitis: Secondary | ICD-10-CM | POA: Diagnosis not present

## 2018-07-26 DIAGNOSIS — E785 Hyperlipidemia, unspecified: Secondary | ICD-10-CM | POA: Diagnosis not present

## 2018-07-26 DIAGNOSIS — K30 Functional dyspepsia: Secondary | ICD-10-CM | POA: Diagnosis not present

## 2018-07-26 DIAGNOSIS — R7989 Other specified abnormal findings of blood chemistry: Secondary | ICD-10-CM | POA: Diagnosis not present

## 2018-07-26 DIAGNOSIS — E876 Hypokalemia: Secondary | ICD-10-CM | POA: Diagnosis not present

## 2018-07-26 DIAGNOSIS — Z7982 Long term (current) use of aspirin: Secondary | ICD-10-CM | POA: Diagnosis not present

## 2018-07-26 LAB — CBC WITH DIFFERENTIAL/PLATELET
Abs Immature Granulocytes: 0.01 10*3/uL (ref 0.00–0.07)
Basophils Absolute: 0 10*3/uL (ref 0.0–0.1)
Basophils Relative: 0 %
Eosinophils Absolute: 0 10*3/uL (ref 0.0–0.5)
Eosinophils Relative: 0 %
HCT: 29.2 % — ABNORMAL LOW (ref 36.0–46.0)
Hemoglobin: 9.7 g/dL — ABNORMAL LOW (ref 12.0–15.0)
Immature Granulocytes: 1 %
Lymphocytes Relative: 88 %
Lymphs Abs: 1 10*3/uL (ref 0.7–4.0)
MCH: 27.2 pg (ref 26.0–34.0)
MCHC: 33.2 g/dL (ref 30.0–36.0)
MCV: 81.8 fL (ref 80.0–100.0)
Monocytes Absolute: 0 10*3/uL — ABNORMAL LOW (ref 0.1–1.0)
Monocytes Relative: 3 %
Neutro Abs: 0.1 10*3/uL — ABNORMAL LOW (ref 1.7–7.7)
Neutrophils Relative %: 8 %
Platelets: 26 10*3/uL — CL (ref 150–400)
RBC: 3.57 MIL/uL — ABNORMAL LOW (ref 3.87–5.11)
RDW: 13.4 % (ref 11.5–15.5)
WBC: 1.2 10*3/uL — CL (ref 4.0–10.5)
nRBC: 0 % (ref 0.0–0.2)

## 2018-07-26 LAB — COMPREHENSIVE METABOLIC PANEL
ALT: 11 U/L (ref 0–44)
AST: 12 U/L — ABNORMAL LOW (ref 15–41)
Albumin: 3.3 g/dL — ABNORMAL LOW (ref 3.5–5.0)
Alkaline Phosphatase: 50 U/L (ref 38–126)
Anion gap: 9 (ref 5–15)
BUN: <5 mg/dL — ABNORMAL LOW (ref 8–23)
CO2: 23 mmol/L (ref 22–32)
Calcium: 9.2 mg/dL (ref 8.9–10.3)
Chloride: 97 mmol/L — ABNORMAL LOW (ref 98–111)
Creatinine, Ser: 0.8 mg/dL (ref 0.44–1.00)
GFR calc Af Amer: >60 mL/min (ref >60)
GFR calc non Af Amer: >60 mL/min (ref >60)
Glucose, Bld: 135 mg/dL — ABNORMAL HIGH (ref 70–99)
Potassium: 3.8 mmol/L (ref 3.5–5.1)
Sodium: 129 mmol/L — ABNORMAL LOW (ref 135–145)
Total Bilirubin: 0.6 mg/dL (ref 0.3–1.2)
Total Protein: 7.2 g/dL (ref 6.5–8.1)

## 2018-07-26 LAB — MAGNESIUM: Magnesium: 1.8 mg/dL (ref 1.7–2.4)

## 2018-07-26 MED ORDER — SODIUM CHLORIDE 0.9% FLUSH
10.0000 mL | INTRAVENOUS | Status: DC | PRN
  Administered 2018-07-26: 10 mL via INTRAVENOUS
  Filled 2018-07-26: qty 10

## 2018-07-26 MED ORDER — SODIUM CHLORIDE 0.9 % IV SOLN
Freq: Once | INTRAVENOUS | Status: AC
  Administered 2018-07-26: 10:00:00 via INTRAVENOUS
  Filled 2018-07-26: qty 250

## 2018-07-26 MED ORDER — HEPARIN SOD (PORK) LOCK FLUSH 100 UNIT/ML IV SOLN
500.0000 [IU] | Freq: Once | INTRAVENOUS | Status: AC
  Administered 2018-07-26: 500 [IU] via INTRAVENOUS

## 2018-07-26 MED ORDER — SODIUM CHLORIDE 0.9 % IV SOLN: 2.0000 g | Freq: Once | INTRAVENOUS | Status: DC

## 2018-07-27 ENCOUNTER — Inpatient Hospital Stay: Payer: Medicare Other

## 2018-07-27 ENCOUNTER — Inpatient Hospital Stay (HOSPITAL_BASED_OUTPATIENT_CLINIC_OR_DEPARTMENT_OTHER): Payer: Medicare Other | Admitting: Oncology

## 2018-07-27 ENCOUNTER — Encounter: Payer: Self-pay | Admitting: Oncology

## 2018-07-27 ENCOUNTER — Other Ambulatory Visit: Payer: Self-pay

## 2018-07-27 VITALS — BP 131/83 | HR 90 | Temp 97.2°F | Ht 62.0 in | Wt 163.0 lb

## 2018-07-27 DIAGNOSIS — D701 Agranulocytosis secondary to cancer chemotherapy: Secondary | ICD-10-CM | POA: Diagnosis not present

## 2018-07-27 DIAGNOSIS — I10 Essential (primary) hypertension: Secondary | ICD-10-CM | POA: Diagnosis not present

## 2018-07-27 DIAGNOSIS — K219 Gastro-esophageal reflux disease without esophagitis: Secondary | ICD-10-CM

## 2018-07-27 DIAGNOSIS — D696 Thrombocytopenia, unspecified: Secondary | ICD-10-CM

## 2018-07-27 DIAGNOSIS — D6481 Anemia due to antineoplastic chemotherapy: Secondary | ICD-10-CM | POA: Diagnosis not present

## 2018-07-27 DIAGNOSIS — R197 Diarrhea, unspecified: Secondary | ICD-10-CM

## 2018-07-27 DIAGNOSIS — R42 Dizziness and giddiness: Secondary | ICD-10-CM

## 2018-07-27 DIAGNOSIS — R531 Weakness: Secondary | ICD-10-CM | POA: Diagnosis not present

## 2018-07-27 DIAGNOSIS — C250 Malignant neoplasm of head of pancreas: Secondary | ICD-10-CM

## 2018-07-27 DIAGNOSIS — K30 Functional dyspepsia: Secondary | ICD-10-CM | POA: Diagnosis not present

## 2018-07-27 DIAGNOSIS — Z87891 Personal history of nicotine dependence: Secondary | ICD-10-CM

## 2018-07-27 DIAGNOSIS — Z7982 Long term (current) use of aspirin: Secondary | ICD-10-CM

## 2018-07-27 DIAGNOSIS — E876 Hypokalemia: Secondary | ICD-10-CM

## 2018-07-27 DIAGNOSIS — E785 Hyperlipidemia, unspecified: Secondary | ICD-10-CM

## 2018-07-27 DIAGNOSIS — E039 Hypothyroidism, unspecified: Secondary | ICD-10-CM

## 2018-07-27 DIAGNOSIS — Z5111 Encounter for antineoplastic chemotherapy: Secondary | ICD-10-CM | POA: Diagnosis not present

## 2018-07-27 DIAGNOSIS — Z79899 Other long term (current) drug therapy: Secondary | ICD-10-CM

## 2018-07-27 DIAGNOSIS — T451X5S Adverse effect of antineoplastic and immunosuppressive drugs, sequela: Secondary | ICD-10-CM | POA: Diagnosis not present

## 2018-07-27 DIAGNOSIS — R5383 Other fatigue: Secondary | ICD-10-CM

## 2018-07-27 DIAGNOSIS — R630 Anorexia: Secondary | ICD-10-CM

## 2018-07-27 DIAGNOSIS — E871 Hypo-osmolality and hyponatremia: Secondary | ICD-10-CM | POA: Diagnosis not present

## 2018-07-27 DIAGNOSIS — H538 Other visual disturbances: Secondary | ICD-10-CM | POA: Diagnosis not present

## 2018-07-27 DIAGNOSIS — T451X5A Adverse effect of antineoplastic and immunosuppressive drugs, initial encounter: Secondary | ICD-10-CM

## 2018-07-27 DIAGNOSIS — K521 Toxic gastroenteritis and colitis: Secondary | ICD-10-CM | POA: Diagnosis not present

## 2018-07-27 DIAGNOSIS — R7989 Other specified abnormal findings of blood chemistry: Secondary | ICD-10-CM | POA: Diagnosis not present

## 2018-07-27 DIAGNOSIS — D6959 Other secondary thrombocytopenia: Secondary | ICD-10-CM | POA: Diagnosis not present

## 2018-07-27 DIAGNOSIS — D702 Other drug-induced agranulocytosis: Secondary | ICD-10-CM

## 2018-07-27 LAB — COMPREHENSIVE METABOLIC PANEL
ALT: 9 U/L (ref 0–44)
AST: 13 U/L — ABNORMAL LOW (ref 15–41)
Albumin: 3.2 g/dL — ABNORMAL LOW (ref 3.5–5.0)
Alkaline Phosphatase: 49 U/L (ref 38–126)
Anion gap: 8 (ref 5–15)
BUN: <5 mg/dL — ABNORMAL LOW (ref 8–23)
CO2: 24 mmol/L (ref 22–32)
Calcium: 8.8 mg/dL — ABNORMAL LOW (ref 8.9–10.3)
Chloride: 99 mmol/L (ref 98–111)
Creatinine, Ser: 0.69 mg/dL (ref 0.44–1.00)
GFR calc Af Amer: >60 mL/min (ref >60)
GFR calc non Af Amer: >60 mL/min (ref >60)
Glucose, Bld: 160 mg/dL — ABNORMAL HIGH (ref 70–99)
Potassium: 3.4 mmol/L — ABNORMAL LOW (ref 3.5–5.1)
Sodium: 131 mmol/L — ABNORMAL LOW (ref 135–145)
Total Bilirubin: 0.4 mg/dL (ref 0.3–1.2)
Total Protein: 6.9 g/dL (ref 6.5–8.1)

## 2018-07-27 LAB — CBC WITH DIFFERENTIAL/PLATELET
Abs Immature Granulocytes: 0.02 10*3/uL (ref 0.00–0.07)
Basophils Absolute: 0 10*3/uL (ref 0.0–0.1)
Basophils Relative: 1 %
Eosinophils Absolute: 0 10*3/uL (ref 0.0–0.5)
Eosinophils Relative: 0 %
HCT: 27.8 % — ABNORMAL LOW (ref 36.0–46.0)
Hemoglobin: 9.3 g/dL — ABNORMAL LOW (ref 12.0–15.0)
Immature Granulocytes: 1 %
Lymphocytes Relative: 76 %
Lymphs Abs: 1.2 10*3/uL (ref 0.7–4.0)
MCH: 27.5 pg (ref 26.0–34.0)
MCHC: 33.5 g/dL (ref 30.0–36.0)
MCV: 82.2 fL (ref 80.0–100.0)
Monocytes Absolute: 0.1 10*3/uL (ref 0.1–1.0)
Monocytes Relative: 4 %
Neutro Abs: 0.3 10*3/uL — ABNORMAL LOW (ref 1.7–7.7)
Neutrophils Relative %: 18 %
Platelets: 28 10*3/uL — CL (ref 150–400)
RBC: 3.38 MIL/uL — ABNORMAL LOW (ref 3.87–5.11)
RDW: 13.4 % (ref 11.5–15.5)
WBC: 1.6 10*3/uL — ABNORMAL LOW (ref 4.0–10.5)
nRBC: 0 % (ref 0.0–0.2)

## 2018-07-27 LAB — MAGNESIUM: Magnesium: 1.5 mg/dL — ABNORMAL LOW (ref 1.7–2.4)

## 2018-07-27 MED ORDER — POTASSIUM CHLORIDE 20 MEQ/100ML IV SOLN: 20.0000 meq | Freq: Once | INTRAVENOUS | Status: DC

## 2018-07-27 MED ORDER — SODIUM CHLORIDE 0.9 % IV SOLN
Freq: Once | INTRAVENOUS | Status: AC
  Filled 2018-07-27: qty 250

## 2018-07-27 MED ORDER — SODIUM CHLORIDE 0.9 % IV SOLN
Freq: Once | INTRAVENOUS | Status: AC
  Administered 2018-07-27: 11:00:00 via INTRAVENOUS
  Filled 2018-07-27: qty 1000

## 2018-07-27 MED ORDER — SODIUM CHLORIDE 0.9 % IV SOLN: 2.0000 g | Freq: Once | INTRAVENOUS | Status: DC

## 2018-07-27 MED ORDER — HEPARIN SOD (PORK) LOCK FLUSH 100 UNIT/ML IV SOLN
500.0000 [IU] | Freq: Once | INTRAVENOUS | Status: AC
  Administered 2018-07-27: 500 [IU] via INTRAVENOUS

## 2018-07-27 NOTE — Progress Notes (Signed)
Hematology/Oncology follow-up note [facility] [phone] [phone]   Patient Care Team: Jodelle Green, FNP as PCP - General (Family Medicine) Clent Jacks, RN as Registered Nurse  REFERRING PROVIDER: Evie Lacks CHIEF COMPLAINTS/REASON FOR VISIT:  Follow-up for management of pancreatic cancer.  HISTORY OF PRESENTING ILLNESS:  LORENZA SHAKIR is a  73 y.o.  female with PMH listed below who was referred to me for evaluation of evaluation of elevated ferritin. Patient recently had lab work-up done on 04/09/2018 which showed ferritin level 1877, iron 97, TIBC 340, iron saturation 29, 04/11/2018 CBC showed hemoglobin 13.2, MCV 81, WBC 7.6, platelet counts 246,000. Patient was referred to hematology for further evaluation of elevated ferritin.  #04/28/2018 Ultrasound of liver was obtained which showed CBD and intrahepatic biliary dilatation Patient wa she is s advised to go to emergency room for further evaluation. Also had hyperbilirubinemia.  Patient was complaining about being jaundiced, pruritus all over. MR abdomen MRCP with and without contrast showed market biliary duct dilatation, secondary to obstruction in the region of pancreatic head.  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 No abdominal adenopathy, liver metastasis or cross vascular involvement.  patient was evaluated by gastroenterology and status post ERCP with stenting. CA 19.9 1173 CEA 3.9 # 05/25/2018  patient was referred to [facility] and s/p whipple resection.Her postop course was c/b Type A pancreatic leak and c.diff. Pathology showed A. Hepatic artery lymph node, excision: One lymph node, negative for malignancy (0/1). B. Biliary stent, removal: Medical device, consistent with stent, gross examination only. C. 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 All margins are negative (closest margin=uncinate, 0.5 mm) Metastatic adenocarcinoma in one of thirty-two lymph nodes (1/32).  Portion of stomach and duodenum with no specific pathologic diagnosis.   Grade, 3 poorly differentiated.  Pathologic pT2 pN1  Cancer Treatment 07/13/2018 Cycle 1 FOLFIRINOX with Onpro Neulasta   INTERVAL HISTORY TENESHA GARZA is a 73 y.o. female who has above history reviewed by me today presents for follow up visit for evaluation prior to adjuvant chemotherapy for treatment of pancreatic cancer. Patient was seen by me 1 week ago. Status post 1 cycle of FOLFIRINOX.  She received half of the planned dose of Irinotecan due to developing nausea, vomiting or diarrhea during the Irinotecan infusion. She was given supportive care and was able to finish rest of planned chemotherapy. Patient had moderate GI toxicities including ongoing diarrhea. Takes Imodium as instructed. For the past week, she has been coming every day for IV hydration and management of electrolyte imbalance. Today she reports that she has average 1-2 loose stool a day.  Uses Imodium as instructed. Appetite is well. Fatigue level is at baseline. Denies any nausea, vomiting.  Patient has lost weight last week.  She has gained weight since last visit and almost at baseline weight.  Despite received pro-Neulasta along with cycle 1, developed neutropenia.  Has been on prophylaxis with Cipro. Denies any fever, chills, abdominal pain, chest pain.  Review of Systems  Constitutional: Positive for fatigue. Negative for appetite change, chills and fever.  HENT:   Negative for hearing loss and voice change.   Eyes: Negative for eye problems.  Respiratory: Negative for chest tightness and cough.   Cardiovascular: Negative for chest pain.  Gastrointestinal: Positive for diarrhea. Negative for abdominal distention, abdominal pain,  blood in stool and nausea.  Endocrine: Negative  for hot flashes.  Genitourinary: Negative for difficulty urinating, frequency and hematuria.   Musculoskeletal: Negative for arthralgias.  Skin: Negative for itching and rash.  Neurological: Negative for extremity weakness.  Hematological: Negative for adenopathy. Does not bruise/bleed easily.  Psychiatric/Behavioral: Negative for confusion.     MEDICAL HISTORY:  Past Medical History:  Diagnosis Date  . Cancer ([place])   . GERD (gastroesophageal reflux disease)   . Hypertension   . Hypothyroidism   . Malignant neoplasm of head of pancreas ([place]) 06/08/2018    SURGICAL HISTORY: Past Surgical History:  Procedure Laterality Date  . CHOLECYSTECTOMY    . COLONOSCOPY N/A 12/02/2016   Procedure: COLONOSCOPY;  Surgeon: Lollie Sails, MD;  Location: [facility] ENDOSCOPY;  Service: Endoscopy;  Laterality: N/A;  . ERCP N/A 04/30/2018   Procedure: ENDOSCOPIC RETROGRADE CHOLANGIOPANCREATOGRAPHY (ERCP);  Surgeon: Lucilla Lame, MD;  Location: [facility] ENDOSCOPY;  Service: Endoscopy;  Laterality: N/A;  . ERCP N/A 05/05/2018   Procedure: ENDOSCOPIC RETROGRADE CHOLANGIOPANCREATOGRAPHY (ERCP);  Surgeon: Lucilla Lame, MD;  Location: [facility] ENDOSCOPY;  Service: Endoscopy;  Laterality: N/A;  . PORTA CATH INSERTION N/A 06/28/2018   Procedure: PORTA CATH INSERTION;  Surgeon: Algernon Huxley, MD;  Location: [facility] LAB;  Service: Cardiovascular;  Laterality: N/A;    SOCIAL HISTORY: Social History   Socioeconomic History  . Marital status: Married    Spouse name: Not on file  . Number of children: Not on file  . Years of education: Not on file  . Highest education level: Not on file  Occupational History  . Not on file  Social Needs  . Financial resource strain: Not on file  . Food insecurity:    Worry: Not on file    Inability: Not on file  . Transportation needs:    Medical: Not on file    Non-medical: Not on file  Tobacco Use  . Smoking status:  Former Smoker    Last attempt to quit: 04/14/1988    Years since quitting: 30.3  . Smokeless tobacco: Never Used  Substance and Sexual Activity  . Alcohol use: Yes    Comment: social drinker  . Drug use: No  . Sexual activity: Not Currently  Lifestyle  . Physical activity:    Days per week: Not on file    Minutes per session: Not on file  . Stress: Not on file  Relationships  . Social connections:    Talks on phone: Not on file    Gets together: Not on file    Attends religious service: Not on file    Active member of club or organization: Not on file    Attends meetings of clubs or organizations: Not on file    Relationship status: Not on file  . Intimate partner violence:    Fear of current or ex partner: Not on file    Emotionally abused: Not on file    Physically abused: Not on file    Forced sexual activity: Not on file  Other Topics Concern  . Not on file  Social History Narrative  . Not on file    FAMILY HISTORY: Family History  Problem Relation Age of Onset  . Diabetes Mellitus I Other   . Alcoholism Other   . Hypertension Other   . Hyperlipidemia Other   . Coronary artery disease Other   . Stroke Other   . Osteoarthritis Other   . Migraines Other   . Heart Problems Mother   . Stroke Sister   .  CAD Brother   . Stroke Brother   . Breast cancer Neg Hx     ALLERGIES:  has No Known Allergies.  MEDICATIONS:  Current Outpatient Medications  Medication Sig Dispense Refill  . chlorhexidine (PERIDEX) 0.12 % solution Use as directed 15 mLs in the mouth or throat 2 (two) times daily. 473 mL 0  . Cholecalciferol (VITAMIN D-3) 25 MCG (1000 UT) CAPS Take by mouth.    . ciprofloxacin (CIPRO) 250 MG tablet Take 1 tablet (250 mg total) by mouth daily. 10 tablet 0  . dexamethasone (DECADRON) 4 MG tablet Take 2 tablets (8 mg total) by mouth daily. Start the day after chemo for 2 days. 8 tablet 5  . levothyroxine (SYNTHROID, LEVOTHROID) 100 MCG tablet Take 1 tablet (100  mcg total) by mouth daily before breakfast. 90 tablet 4  . loperamide (IMODIUM) 2 MG capsule Take 1 capsule (2 mg total) by mouth See admin instructions. With onset of loose stool, take 4mg  followed by 2mg  every 2 hours until 12 hours have passed without loose bowel movement. Maximum: 16 mg/day 120 capsule 1  . omeprazole (PRILOSEC) 20 MG capsule Take 1 capsule (20 mg total) by mouth daily. 30 capsule 2  . ondansetron (ZOFRAN) 4 MG tablet Take 1 tablet (4 mg total) by mouth every 6 (six) hours as needed for nausea or vomiting. 30 tablet 3  . potassium chloride SA (K-DUR,KLOR-CON) 20 MEQ tablet Take 2 tablets (40 mEq total) by mouth daily. 28 tablet 0  . prochlorperazine (COMPAZINE) 10 MG tablet Take 1 tablet (10 mg total) by mouth every 6 (six) hours as needed (NAUSEA). 30 tablet 1  . traMADol (ULTRAM) 50 MG tablet Take 1 tablet (50 mg total) by mouth every 6 (six) hours as needed. 30 tablet 0   No current facility-administered medications for this visit.      PHYSICAL EXAMINATION: ECOG PERFORMANCE STATUS: 0 - Asymptomatic Vitals:   07/27/18 0846  BP: 131/83  Pulse: 90  Temp: (!) 97.2 F (36.2 C)   Filed Weights   07/27/18 0846  Weight: 163 lb (73.9 kg)    Physical Exam Constitutional:      General: She is not in acute distress. HENT:     Head: Normocephalic and atraumatic.     Mouth/Throat:     Comments: No thrush Eyes:     General: No scleral icterus.    Pupils: Pupils are equal, round, and reactive to light.  Neck:     Musculoskeletal: Normal range of motion and neck supple.  Cardiovascular:     Rate and Rhythm: Normal rate and regular rhythm.     Heart sounds: Normal heart sounds.  Pulmonary:     Effort: Pulmonary effort is normal. No respiratory distress.     Breath sounds: No wheezing.  Abdominal:     General: Bowel sounds are normal. There is no distension.     Palpations: Abdomen is soft. There is no mass.     Tenderness: There is no abdominal tenderness.    Musculoskeletal: Normal range of motion.        General: No deformity.  Skin:    General: Skin is warm and dry.     Findings: No erythema or rash.  Neurological:     Mental Status: She is alert and oriented to person, place, and time.     Cranial Nerves: No cranial nerve deficit.     Coordination: Coordination normal.  Psychiatric:        Behavior: Behavior  normal.        Thought Content: Thought content normal.      LABORATORY DATA:  I have reviewed the data as listed Lab Results  Component Value Date   WBC 1.6 (L) 07/27/2018   HGB 9.3 (L) 07/27/2018   HCT 27.8 (L) 07/27/2018   MCV 82.2 07/27/2018   PLT 28 (LL) 07/27/2018   Recent Labs    04/21/18 1046  04/29/18 0612  05/10/18 1429  07/22/18 1022 07/23/18 1002 07/26/18 0920 07/27/18 0824  NA 137   < > 136   < >  --    < > 128* 129* 129* 131*  K 3.8   < > 3.3*   < >  --    < > 2.8* 3.3* 3.8 3.4*  CL 100   < > 108   < >  --    < > 97* 98 97* 99  CO2 22   < > 23   < >  --    < > 23 24 23 24   GLUCOSE 95   < > 101*   < >  --    < > 139* 142* 135* 160*  BUN 23   < > 9   < >  --    < > 8 6* <5* <5*  CREATININE 1.11*   < > 0.49   < >  --    < > 0.79 0.68 0.80 0.69  CALCIUM 10.6*   < > 9.2   < >  --    < > 8.7* 8.6* 9.2 8.8*  GFRNONAA 50*   < > >60   < >  --    < > >60 >60 >60 >60  GFRAA 57*   < > >60   < >  --    < > >60 >60 >60 >60  PROT 7.7   < >  --    < > 7.3   < > 7.2  --  7.2 6.9  ALBUMIN 3.9   < >  --    < > 3.8   < > 3.5  --  3.3* 3.2*  AST 61*   < >  --    < > 94*   < > 15  --  12* 13*  ALT 121*   < >  --    < > 80*   < > 17  --  11 9  ALKPHOS 275*   < >  --    < > 200*   < > 52  --  50 49  BILITOT 11.7*   < > 13.7*   < > 6.2*   < > 1.2  --  0.6 0.4  BILIDIR 8.84*  --  8.2*  --  4.58*  --   --   --   --   --    < > = values in this interval not displayed.   Iron/TIBC/Ferritin/ %Sat    Component Value Date/Time   IRON 77 05/04/2018 0831   IRON 97 04/09/2018 1440   TIBC 320 05/04/2018 0831   TIBC 340  04/09/2018 1440   FERRITIN 496 (H) 05/04/2018 0831   FERRITIN 1,877 (H) 04/09/2018 1440   IRONPCTSAT 24 05/04/2018 0831   IRONPCTSAT 29 04/09/2018 1440     post op CA 19.9 is 21.    ASSESSMENT & PLAN:  1. Malignant neoplasm of head of pancreas ([place])   2. Hypomagnesemia   3. Hyponatremia   4. Drug-induced  neutropenia ([place])   5. Hypokalemia   6. Thrombocytopenia ([place])   7. Anemia due to antineoplastic chemotherapy   8. Diarrhea, unspecified type    # pT2 pN1 M0, Stage IIB pancreatic cancer S/p whipple resection.  Status post 1 cycle of FOLFIRINOX She has had GI toxicities and hematological toxicities.  Hold chemotherapy. I will check labs UGT1A1*28 allele status [UGT1A1 Irinotecan Toxicity] -check DPD 5-Fluorouracil Toxicity  #Diarrhea secondary to chemotherapy, continue utilize Imodium as instructed. #Hyponatremia, likely due to decreased oral intake and volume loss due to diarrhea.  Proceed with IV fluid normal saline 1 L x 1 today.  Encourage oral hydration #Hypokalemia, improving continue oral potassium supplements. We will proceed with 20 mEq IV potassium chloride x1 today. #Hypomagnesia, proceed with 2 g of magnesium sulfate IV today.  #Indigestion, improved with omeprazole 20 mg daily.   #Neutropenia secondary to chemotherapy, continue prophylactic antibiotics with Cipro #Anemia, secondary to chemotherapy.  Stable continue to monitor.  #Grade 3 thrombocytopenia, due to chemotherapy.  Aspirin has been held.  Repeat CBC in 3 days. Orders Placed This Encounter  Procedures  . Magnesium    Standing Status:   Standing    Number of Occurrences:   20    Standing Expiration Date:   07/28/2019  . Miscellaneous [facility] test (send-out)    Standing Status:   Future    Standing Expiration Date:   07/27/2019    Order Specific Question:   Test name / description:    Answer:   UGT1A1 Irinotecan Toxicity  . Miscellaneous [facility] test (send-out)    Standing Status:   Future     Standing Expiration Date:   07/28/2019    Order Specific Question:   Test name / description:    Answer:   DPD 5-Fluorouracil Toxicity    Earlie Server, MD, PhD Hematology Oncology [place] at [facility] Pager- [phone] 07/27/2018

## 2018-07-27 NOTE — Progress Notes (Signed)
Patient here today for follow up.  Patient states no new concerns today  

## 2018-07-28 ENCOUNTER — Telehealth: Payer: Self-pay | Admitting: *Deleted

## 2018-07-28 ENCOUNTER — Inpatient Hospital Stay: Payer: Medicare Other

## 2018-07-28 NOTE — Telephone Encounter (Signed)
labs/IV/+/- MG/ +/- Potassium this week per MD 07/27/18 staff message Appts were scheduled as requested. I called and left a detailed message on patient's vmail making her aware of the scheduled date and time of her appts.

## 2018-07-29 ENCOUNTER — Other Ambulatory Visit: Payer: Self-pay

## 2018-07-29 DIAGNOSIS — C250 Malignant neoplasm of head of pancreas: Secondary | ICD-10-CM

## 2018-07-30 ENCOUNTER — Inpatient Hospital Stay: Payer: Medicare Other

## 2018-07-30 ENCOUNTER — Other Ambulatory Visit: Payer: Self-pay

## 2018-07-30 DIAGNOSIS — D701 Agranulocytosis secondary to cancer chemotherapy: Secondary | ICD-10-CM | POA: Diagnosis not present

## 2018-07-30 DIAGNOSIS — Z5111 Encounter for antineoplastic chemotherapy: Secondary | ICD-10-CM | POA: Diagnosis not present

## 2018-07-30 DIAGNOSIS — C250 Malignant neoplasm of head of pancreas: Secondary | ICD-10-CM

## 2018-07-30 DIAGNOSIS — E871 Hypo-osmolality and hyponatremia: Secondary | ICD-10-CM | POA: Diagnosis not present

## 2018-07-30 DIAGNOSIS — Z79899 Other long term (current) drug therapy: Secondary | ICD-10-CM | POA: Diagnosis not present

## 2018-07-30 DIAGNOSIS — I10 Essential (primary) hypertension: Secondary | ICD-10-CM | POA: Diagnosis not present

## 2018-07-30 DIAGNOSIS — R5383 Other fatigue: Secondary | ICD-10-CM | POA: Diagnosis not present

## 2018-07-30 DIAGNOSIS — E039 Hypothyroidism, unspecified: Secondary | ICD-10-CM | POA: Diagnosis not present

## 2018-07-30 DIAGNOSIS — R7989 Other specified abnormal findings of blood chemistry: Secondary | ICD-10-CM | POA: Diagnosis not present

## 2018-07-30 DIAGNOSIS — Z7982 Long term (current) use of aspirin: Secondary | ICD-10-CM | POA: Diagnosis not present

## 2018-07-30 DIAGNOSIS — H538 Other visual disturbances: Secondary | ICD-10-CM | POA: Diagnosis not present

## 2018-07-30 DIAGNOSIS — T451X5S Adverse effect of antineoplastic and immunosuppressive drugs, sequela: Secondary | ICD-10-CM | POA: Diagnosis not present

## 2018-07-30 DIAGNOSIS — D6481 Anemia due to antineoplastic chemotherapy: Secondary | ICD-10-CM | POA: Diagnosis not present

## 2018-07-30 DIAGNOSIS — K30 Functional dyspepsia: Secondary | ICD-10-CM | POA: Diagnosis not present

## 2018-07-30 DIAGNOSIS — R531 Weakness: Secondary | ICD-10-CM | POA: Diagnosis not present

## 2018-07-30 DIAGNOSIS — K219 Gastro-esophageal reflux disease without esophagitis: Secondary | ICD-10-CM | POA: Diagnosis not present

## 2018-07-30 DIAGNOSIS — R42 Dizziness and giddiness: Secondary | ICD-10-CM | POA: Diagnosis not present

## 2018-07-30 DIAGNOSIS — E785 Hyperlipidemia, unspecified: Secondary | ICD-10-CM | POA: Diagnosis not present

## 2018-07-30 DIAGNOSIS — K521 Toxic gastroenteritis and colitis: Secondary | ICD-10-CM | POA: Diagnosis not present

## 2018-07-30 DIAGNOSIS — E876 Hypokalemia: Secondary | ICD-10-CM | POA: Diagnosis not present

## 2018-07-30 DIAGNOSIS — D6959 Other secondary thrombocytopenia: Secondary | ICD-10-CM | POA: Diagnosis not present

## 2018-07-30 LAB — CBC WITH DIFFERENTIAL/PLATELET
Abs Immature Granulocytes: 0.2 10*3/uL — ABNORMAL HIGH (ref 0.00–0.07)
Band Neutrophils: 4 %
Basophils Absolute: 0 10*3/uL (ref 0.0–0.1)
Basophils Relative: 0 %
Eosinophils Absolute: 0 10*3/uL (ref 0.0–0.5)
Eosinophils Relative: 0 %
HCT: 26.3 % — ABNORMAL LOW (ref 36.0–46.0)
Hemoglobin: 8.6 g/dL — ABNORMAL LOW (ref 12.0–15.0)
Lymphocytes Relative: 45 %
Lymphs Abs: 1.7 10*3/uL (ref 0.7–4.0)
MCH: 27.3 pg (ref 26.0–34.0)
MCHC: 32.7 g/dL (ref 30.0–36.0)
MCV: 83.5 fL (ref 80.0–100.0)
Metamyelocytes Relative: 3 %
Monocytes Absolute: 0.2 10*3/uL (ref 0.1–1.0)
Monocytes Relative: 6 %
Myelocytes: 1 %
Neutro Abs: 1.7 10*3/uL (ref 1.7–7.7)
Neutrophils Relative %: 41 %
Platelets: 90 10*3/uL — ABNORMAL LOW (ref 150–400)
RBC: 3.15 MIL/uL — ABNORMAL LOW (ref 3.87–5.11)
RDW: 14.1 % (ref 11.5–15.5)
Smear Review: DECREASED
WBC: 3.8 10*3/uL — ABNORMAL LOW (ref 4.0–10.5)
nRBC: 0 % (ref 0.0–0.2)

## 2018-07-30 LAB — COMPREHENSIVE METABOLIC PANEL
ALT: 11 U/L (ref 0–44)
AST: 16 U/L (ref 15–41)
Albumin: 3.2 g/dL — ABNORMAL LOW (ref 3.5–5.0)
Alkaline Phosphatase: 66 U/L (ref 38–126)
Anion gap: 8 (ref 5–15)
BUN: <5 mg/dL — ABNORMAL LOW (ref 8–23)
CO2: 22 mmol/L (ref 22–32)
Calcium: 9.3 mg/dL (ref 8.9–10.3)
Chloride: 101 mmol/L (ref 98–111)
Creatinine, Ser: 0.98 mg/dL (ref 0.44–1.00)
GFR calc Af Amer: >60 mL/min (ref >60)
GFR calc non Af Amer: 58 mL/min — ABNORMAL LOW (ref >60)
Glucose, Bld: 137 mg/dL — ABNORMAL HIGH (ref 70–99)
Potassium: 3.5 mmol/L (ref 3.5–5.1)
Sodium: 131 mmol/L — ABNORMAL LOW (ref 135–145)
Total Bilirubin: 0.5 mg/dL (ref 0.3–1.2)
Total Protein: 6.5 g/dL (ref 6.5–8.1)

## 2018-07-30 LAB — MAGNESIUM: Magnesium: 1.6 mg/dL — ABNORMAL LOW (ref 1.7–2.4)

## 2018-07-30 MED ORDER — SODIUM CHLORIDE 0.9 % IV SOLN: 2.0000 g | Freq: Once | INTRAVENOUS | Status: DC

## 2018-07-30 MED ORDER — SODIUM CHLORIDE 0.9 % IV SOLN
INTRAVENOUS | Status: DC
  Administered 2018-07-30: 12:00:00 via INTRAVENOUS
  Filled 2018-07-30 (×2): qty 1000

## 2018-07-30 MED ORDER — HEPARIN SOD (PORK) LOCK FLUSH 100 UNIT/ML IV SOLN
500.0000 [IU] | Freq: Once | INTRAVENOUS | Status: AC | PRN
  Administered 2018-07-30: 500 [IU]

## 2018-07-30 MED ORDER — SODIUM CHLORIDE 0.9 % IV SOLN
Freq: Once | INTRAVENOUS | Status: AC
  Filled 2018-07-30: qty 250

## 2018-08-03 ENCOUNTER — Other Ambulatory Visit: Payer: Self-pay

## 2018-08-03 ENCOUNTER — Encounter: Payer: Self-pay | Admitting: Oncology

## 2018-08-03 ENCOUNTER — Inpatient Hospital Stay (HOSPITAL_BASED_OUTPATIENT_CLINIC_OR_DEPARTMENT_OTHER): Payer: Medicare Other | Admitting: Oncology

## 2018-08-03 ENCOUNTER — Inpatient Hospital Stay: Payer: Medicare Other

## 2018-08-03 VITALS — BP 145/79 | HR 83 | Temp 96.1°F | Resp 18 | Wt 162.1 lb

## 2018-08-03 DIAGNOSIS — I10 Essential (primary) hypertension: Secondary | ICD-10-CM | POA: Diagnosis not present

## 2018-08-03 DIAGNOSIS — D701 Agranulocytosis secondary to cancer chemotherapy: Secondary | ICD-10-CM

## 2018-08-03 DIAGNOSIS — E876 Hypokalemia: Secondary | ICD-10-CM | POA: Diagnosis not present

## 2018-08-03 DIAGNOSIS — K219 Gastro-esophageal reflux disease without esophagitis: Secondary | ICD-10-CM | POA: Diagnosis not present

## 2018-08-03 DIAGNOSIS — T451X5A Adverse effect of antineoplastic and immunosuppressive drugs, initial encounter: Secondary | ICD-10-CM

## 2018-08-03 DIAGNOSIS — Z5111 Encounter for antineoplastic chemotherapy: Secondary | ICD-10-CM | POA: Diagnosis not present

## 2018-08-03 DIAGNOSIS — E871 Hypo-osmolality and hyponatremia: Secondary | ICD-10-CM

## 2018-08-03 DIAGNOSIS — C250 Malignant neoplasm of head of pancreas: Secondary | ICD-10-CM

## 2018-08-03 DIAGNOSIS — D702 Other drug-induced agranulocytosis: Secondary | ICD-10-CM

## 2018-08-03 DIAGNOSIS — Z79899 Other long term (current) drug therapy: Secondary | ICD-10-CM

## 2018-08-03 DIAGNOSIS — T451X5S Adverse effect of antineoplastic and immunosuppressive drugs, sequela: Secondary | ICD-10-CM | POA: Diagnosis not present

## 2018-08-03 DIAGNOSIS — R5383 Other fatigue: Secondary | ICD-10-CM | POA: Diagnosis not present

## 2018-08-03 DIAGNOSIS — K521 Toxic gastroenteritis and colitis: Secondary | ICD-10-CM

## 2018-08-03 DIAGNOSIS — H538 Other visual disturbances: Secondary | ICD-10-CM

## 2018-08-03 DIAGNOSIS — K30 Functional dyspepsia: Secondary | ICD-10-CM | POA: Diagnosis not present

## 2018-08-03 DIAGNOSIS — D6959 Other secondary thrombocytopenia: Secondary | ICD-10-CM

## 2018-08-03 DIAGNOSIS — R42 Dizziness and giddiness: Secondary | ICD-10-CM

## 2018-08-03 DIAGNOSIS — R7989 Other specified abnormal findings of blood chemistry: Secondary | ICD-10-CM

## 2018-08-03 DIAGNOSIS — E039 Hypothyroidism, unspecified: Secondary | ICD-10-CM | POA: Diagnosis not present

## 2018-08-03 DIAGNOSIS — D696 Thrombocytopenia, unspecified: Secondary | ICD-10-CM

## 2018-08-03 DIAGNOSIS — R531 Weakness: Secondary | ICD-10-CM | POA: Diagnosis not present

## 2018-08-03 DIAGNOSIS — Z87891 Personal history of nicotine dependence: Secondary | ICD-10-CM

## 2018-08-03 DIAGNOSIS — D6481 Anemia due to antineoplastic chemotherapy: Secondary | ICD-10-CM

## 2018-08-03 DIAGNOSIS — Z7982 Long term (current) use of aspirin: Secondary | ICD-10-CM

## 2018-08-03 DIAGNOSIS — K251 Acute gastric ulcer with perforation: Secondary | ICD-10-CM

## 2018-08-03 DIAGNOSIS — E785 Hyperlipidemia, unspecified: Secondary | ICD-10-CM

## 2018-08-03 DIAGNOSIS — R630 Anorexia: Secondary | ICD-10-CM

## 2018-08-03 LAB — CBC WITH DIFFERENTIAL/PLATELET
Abs Immature Granulocytes: 0.48 10*3/uL — ABNORMAL HIGH (ref 0.00–0.07)
Basophils Absolute: 0 10*3/uL (ref 0.0–0.1)
Basophils Relative: 1 %
Eosinophils Absolute: 0 10*3/uL (ref 0.0–0.5)
Eosinophils Relative: 0 %
HCT: 27.8 % — ABNORMAL LOW (ref 36.0–46.0)
Hemoglobin: 9.1 g/dL — ABNORMAL LOW (ref 12.0–15.0)
Immature Granulocytes: 6 %
Lymphocytes Relative: 32 %
Lymphs Abs: 2.5 10*3/uL (ref 0.7–4.0)
MCH: 27.3 pg (ref 26.0–34.0)
MCHC: 32.7 g/dL (ref 30.0–36.0)
MCV: 83.5 fL (ref 80.0–100.0)
Monocytes Absolute: 0.6 10*3/uL (ref 0.1–1.0)
Monocytes Relative: 8 %
Neutro Abs: 4.2 10*3/uL (ref 1.7–7.7)
Neutrophils Relative %: 53 %
Platelets: 330 10*3/uL (ref 150–400)
RBC: 3.33 MIL/uL — ABNORMAL LOW (ref 3.87–5.11)
RDW: 14.8 % (ref 11.5–15.5)
WBC: 7.9 10*3/uL (ref 4.0–10.5)
nRBC: 0 % (ref 0.0–0.2)

## 2018-08-03 LAB — COMPREHENSIVE METABOLIC PANEL
ALT: 12 U/L (ref 0–44)
AST: 27 U/L (ref 15–41)
Albumin: 3.5 g/dL (ref 3.5–5.0)
Alkaline Phosphatase: 76 U/L (ref 38–126)
Anion gap: 11 (ref 5–15)
BUN: 12 mg/dL (ref 8–23)
CO2: 22 mmol/L (ref 22–32)
Calcium: 9.1 mg/dL (ref 8.9–10.3)
Chloride: 100 mmol/L (ref 98–111)
Creatinine, Ser: 1.05 mg/dL — ABNORMAL HIGH (ref 0.44–1.00)
GFR calc Af Amer: >60 mL/min (ref >60)
GFR calc non Af Amer: 53 mL/min — ABNORMAL LOW (ref >60)
Glucose, Bld: 138 mg/dL — ABNORMAL HIGH (ref 70–99)
Potassium: 3.8 mmol/L (ref 3.5–5.1)
Sodium: 133 mmol/L — ABNORMAL LOW (ref 135–145)
Total Bilirubin: 0.5 mg/dL (ref 0.3–1.2)
Total Protein: 6.8 g/dL (ref 6.5–8.1)

## 2018-08-03 LAB — MAGNESIUM: Magnesium: 1.6 mg/dL — ABNORMAL LOW (ref 1.7–2.4)

## 2018-08-03 MED ORDER — IRINOTECAN HCL CHEMO INJECTION 100 MG/5ML
65.0000 mg/m2 | Freq: Once | INTRAVENOUS | Status: AC
  Administered 2018-08-03: 120 mg via INTRAVENOUS
  Filled 2018-08-03: qty 5

## 2018-08-03 MED ORDER — FLUOROURACIL CHEMO INJECTION 500 MG/10ML
360.0000 mg | Freq: Once | INTRAVENOUS | Status: AC
  Administered 2018-08-03: 350 mg via INTRAVENOUS
  Filled 2018-08-03: qty 7

## 2018-08-03 MED ORDER — DEXAMETHASONE SODIUM PHOSPHATE 10 MG/ML IJ SOLN
10.0000 mg | Freq: Once | INTRAMUSCULAR | Status: AC
  Administered 2018-08-03: 10 mg via INTRAVENOUS
  Filled 2018-08-03: qty 1

## 2018-08-03 MED ORDER — SODIUM CHLORIDE 0.9 % IV SOLN
1200.0000 mg/m2 | INTRAVENOUS | Status: DC
  Administered 2018-08-03: 2250 mg via INTRAVENOUS
  Filled 2018-08-03: qty 45

## 2018-08-03 MED ORDER — DEXTROSE 5 % IV SOLN
Freq: Once | INTRAVENOUS | Status: AC
  Administered 2018-08-03: 10:00:00 via INTRAVENOUS
  Filled 2018-08-03: qty 250

## 2018-08-03 MED ORDER — PALONOSETRON HCL INJECTION 0.25 MG/5ML
0.2500 mg | Freq: Once | INTRAVENOUS | Status: AC
  Administered 2018-08-03: 0.25 mg via INTRAVENOUS
  Filled 2018-08-03: qty 5

## 2018-08-03 MED ORDER — DEXAMETHASONE SODIUM PHOSPHATE 10 MG/ML IJ SOLN
INTRAMUSCULAR | Status: AC
  Filled 2018-08-03: qty 1

## 2018-08-03 MED ORDER — SODIUM CHLORIDE 0.9 % IV SOLN: 2.0000 g | Freq: Once | INTRAVENOUS | Status: DC

## 2018-08-03 MED ORDER — CHLORHEXIDINE GLUCONATE 0.12 % MT SOLN: 15.0000 mL | Freq: Two times a day (BID) | OROMUCOSAL | 3 refills | Status: DC

## 2018-08-03 MED ORDER — ATROPINE SULFATE 1 MG/ML IJ SOLN
0.5000 mg | Freq: Once | INTRAMUSCULAR | Status: AC | PRN
  Administered 2018-08-03: 0.5 mg via INTRAVENOUS
  Filled 2018-08-03: qty 1

## 2018-08-03 MED ORDER — OXALIPLATIN CHEMO INJECTION 100 MG/20ML
135.0000 mg | Freq: Once | INTRAVENOUS | Status: AC
  Administered 2018-08-03: 135 mg via INTRAVENOUS
  Filled 2018-08-03: qty 20

## 2018-08-03 MED ORDER — MAGNESIUM SULFATE 2 GM/50ML IV SOLN
2.0000 g | Freq: Once | INTRAVENOUS | Status: AC
  Administered 2018-08-03: 2 g via INTRAVENOUS
  Filled 2018-08-03: qty 50

## 2018-08-03 MED ORDER — LEUCOVORIN CALCIUM INJECTION 350 MG
750.0000 mg | Freq: Once | INTRAVENOUS | Status: AC
  Administered 2018-08-03: 750 mg via INTRAVENOUS
  Filled 2018-08-03: qty 37.5

## 2018-08-03 NOTE — Addendum Note (Signed)
Addended by: Earlie Server on: 08/03/2018 01:02 PM   Modules accepted: Orders

## 2018-08-03 NOTE — Progress Notes (Signed)
Patient here for follow up. States both ankles have began to swell. No diarrhea.

## 2018-08-03 NOTE — Progress Notes (Signed)
Hematology/Oncology follow-up note [facility] [phone] [phone]   Patient Care Team: Jodelle Green, FNP as PCP - General (Family Medicine) Clent Jacks, RN as Registered Nurse  REFERRING PROVIDER: Evie Lacks CHIEF COMPLAINTS/REASON FOR VISIT:  Follow-up for management of pancreatic cancer.  HISTORY OF PRESENTING ILLNESS:  Lynn Taylor is a  73 y.o.  female with PMH listed below who was referred to me for evaluation of evaluation of elevated ferritin. Patient recently had lab work-up done on 04/09/2018 which showed ferritin level 1877, iron 97, TIBC 340, iron saturation 29, 04/11/2018 CBC showed hemoglobin 13.2, MCV 81, WBC 7.6, platelet counts 246,000. Patient was referred to hematology for further evaluation of elevated ferritin.  #04/28/2018 Ultrasound of liver was obtained which showed CBD and intrahepatic biliary dilatation Patient wa she is s advised to go to emergency room for further evaluation. Also had hyperbilirubinemia.  Patient was complaining about being jaundiced, pruritus all over. MR abdomen MRCP with and without contrast showed market biliary duct dilatation, secondary to obstruction in the region of pancreatic head.  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 No abdominal adenopathy, liver metastasis or cross vascular involvement.  patient was evaluated by gastroenterology and status post ERCP with stenting. CA 19.9 1173 CEA 3.9 # 05/25/2018  patient was referred to [facility] and s/p whipple resection.Her postop course was c/b Type A pancreatic leak and c.diff. Pathology showed A. Hepatic artery lymph node, excision: One lymph node, negative for malignancy (0/1). B. Biliary stent, removal: Medical device, consistent with stent, gross examination only. C. 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 All margins are negative (closest margin=uncinate, 0.5 mm) Metastatic adenocarcinoma in one of thirty-two lymph nodes (1/32).  Portion of stomach and duodenum with no specific pathologic diagnosis.   Grade, 3 poorly differentiated.  Pathologic pT2 pN1  Cancer Treatment 07/13/2018 Cycle 1 FOLFIRINOX with Onpro Neulasta   INTERVAL HISTORY Lynn Taylor is a 73 y.o. female who has above history reviewed by me today presents for follow up visit for evaluation prior to adjuvant chemotherapy for treatment of pancreatic cancer. Patient was seen by me 1 week ago. Status post 1 cycle of FOLFIRINOX on 08/02/2018. She did not tolerate Irinotecan well due to developing nausea vomiting and diarrhea during Irinotecan confusion.  So she was only getting half dose of the planned irinotecan Also despite receiving G-CSF support with onpro Neulasta, she developed grade 4 neutropenia Also has grade 3 thrombocytopenia. Cycle 2 FOLFIRINOX was delayed Patient received supportive care with IV hydration and IV electrolyte supplements for hypokalemia and hypomagnesia. She had developed loose stool on average 1-2 loose stools a day.  She takes Imodium as instructed. Today she reports that diarrhea has completely resolved.  She now has formed stool Denies any nausea vomiting fever or chills.  Denies any abdominal pain. She feels well today.   Review of Systems  Constitutional: Positive for fatigue. Negative for appetite change, chills and fever.  HENT:   Negative for hearing loss and voice change.   Eyes: Negative for eye problems.  Respiratory: Negative for chest tightness and cough.   Cardiovascular: Negative for chest pain.  Gastrointestinal: Negative for abdominal distention, abdominal pain, blood in stool, diarrhea and nausea.  Endocrine: Negative for hot flashes.  Genitourinary: Negative for difficulty urinating, frequency and  hematuria.   Musculoskeletal: Negative for arthralgias.  Skin: Negative for itching and  rash.  Neurological: Negative for extremity weakness.  Hematological: Negative for adenopathy. Does not bruise/bleed easily.  Psychiatric/Behavioral: Negative for confusion.     MEDICAL HISTORY:  Past Medical History:  Diagnosis Date  . Cancer ([place])   . GERD (gastroesophageal reflux disease)   . Hypertension   . Hypothyroidism   . Malignant neoplasm of head of pancreas ([place]) 06/08/2018    SURGICAL HISTORY: Past Surgical History:  Procedure Laterality Date  . CHOLECYSTECTOMY    . COLONOSCOPY N/A 12/02/2016   Procedure: COLONOSCOPY;  Surgeon: Lollie Sails, MD;  Location: [facility] ENDOSCOPY;  Service: Endoscopy;  Laterality: N/A;  . ERCP N/A 04/30/2018   Procedure: ENDOSCOPIC RETROGRADE CHOLANGIOPANCREATOGRAPHY (ERCP);  Surgeon: Lucilla Lame, MD;  Location: [facility] ENDOSCOPY;  Service: Endoscopy;  Laterality: N/A;  . ERCP N/A 05/05/2018   Procedure: ENDOSCOPIC RETROGRADE CHOLANGIOPANCREATOGRAPHY (ERCP);  Surgeon: Lucilla Lame, MD;  Location: [facility] ENDOSCOPY;  Service: Endoscopy;  Laterality: N/A;  . PORTA CATH INSERTION N/A 06/28/2018   Procedure: PORTA CATH INSERTION;  Surgeon: Algernon Huxley, MD;  Location: [facility] LAB;  Service: Cardiovascular;  Laterality: N/A;    SOCIAL HISTORY: Social History   Socioeconomic History  . Marital status: Married    Spouse name: Not on file  . Number of children: Not on file  . Years of education: Not on file  . Highest education level: Not on file  Occupational History  . Not on file  Social Needs  . Financial resource strain: Not on file  . Food insecurity:    Worry: Not on file    Inability: Not on file  . Transportation needs:    Medical: Not on file    Non-medical: Not on file  Tobacco Use  . Smoking status: Former Smoker    Last attempt to quit: 04/14/1988    Years since quitting: 30.3  . Smokeless tobacco: Never Used  Substance and  Sexual Activity  . Alcohol use: Yes    Comment: social drinker  . Drug use: No  . Sexual activity: Not Currently  Lifestyle  . Physical activity:    Days per week: Not on file    Minutes per session: Not on file  . Stress: Not on file  Relationships  . Social connections:    Talks on phone: Not on file    Gets together: Not on file    Attends religious service: Not on file    Active member of club or organization: Not on file    Attends meetings of clubs or organizations: Not on file    Relationship status: Not on file  . Intimate partner violence:    Fear of current or ex partner: Not on file    Emotionally abused: Not on file    Physically abused: Not on file    Forced sexual activity: Not on file  Other Topics Concern  . Not on file  Social History Narrative  . Not on file    FAMILY HISTORY: Family History  Problem Relation Age of Onset  . Diabetes Mellitus I Other   . Alcoholism Other   . Hypertension Other   . Hyperlipidemia Other   . Coronary artery disease Other   . Stroke Other   . Osteoarthritis Other   . Migraines Other   . Heart Problems Mother   . Stroke Sister   . CAD Brother   . Stroke Brother   . Breast cancer Neg Hx     ALLERGIES:  has No Known Allergies.  MEDICATIONS:  Current Outpatient Medications  Medication Sig Dispense Refill  . chlorhexidine (PERIDEX) 0.12 % solution Use as directed 15 mLs in the mouth or throat 2 (two) times daily. 473 mL 0  . Cholecalciferol (VITAMIN D-3) 25 MCG (1000 UT) CAPS Take by mouth.    . dexamethasone (DECADRON) 4 MG tablet Take 2 tablets (8 mg total) by mouth daily. Start the day after chemo for 2 days. 8 tablet 5  . levothyroxine (SYNTHROID, LEVOTHROID) 100 MCG tablet Take 1 tablet (100 mcg total) by mouth daily before breakfast. 90 tablet 4  . loperamide (IMODIUM) 2 MG capsule Take 1 capsule (2 mg total) by mouth See admin instructions. With onset of loose stool, take 4mg  followed by 2mg  every 2 hours until  12 hours have passed without loose bowel movement. Maximum: 16 mg/day 120 capsule 1  . omeprazole (PRILOSEC) 20 MG capsule Take 1 capsule (20 mg total) by mouth daily. 30 capsule 2  . ondansetron (ZOFRAN) 4 MG tablet Take 1 tablet (4 mg total) by mouth every 6 (six) hours as needed for nausea or vomiting. 30 tablet 3  . potassium chloride SA (K-DUR,KLOR-CON) 20 MEQ tablet Take 2 tablets (40 mEq total) by mouth daily. 28 tablet 0  . prochlorperazine (COMPAZINE) 10 MG tablet Take 1 tablet (10 mg total) by mouth every 6 (six) hours as needed (NAUSEA). 30 tablet 1  . traMADol (ULTRAM) 50 MG tablet Take 1 tablet (50 mg total) by mouth every 6 (six) hours as needed. 30 tablet 0  . ciprofloxacin (CIPRO) 250 MG tablet Take 1 tablet (250 mg total) by mouth daily. (Patient not taking: Reported on 08/03/2018) 10 tablet 0   No current facility-administered medications for this visit.    Facility-Administered Medications Ordered in Other Visits  Medication Dose Route Frequency Provider Last Rate Last Dose  . fluorouracil (ADRUCIL) 2,250 mg in sodium chloride 0.9 % 105 mL chemo infusion  1,200 mg/m2 (Treatment Plan Recorded) Intravenous 1 day or 1 dose Earlie Server, MD      . fluorouracil (ADRUCIL) chemo injection 350 mg  350 mg Intravenous Once Earlie Server, MD      . magnesium sulfate IVPB 2 g 50 mL  2 g Intravenous Once Earlie Server, MD 50 mL/hr at 08/03/18 1234 2 g at 08/03/18 1234  . oxaliplatin (ELOXATIN) 135 mg in dextrose 5 % 500 mL chemo infusion  135 mg Intravenous Once Earlie Server, MD 264 mL/hr at 08/03/18 1231 135 mg at 08/03/18 1231     PHYSICAL EXAMINATION: ECOG PERFORMANCE STATUS: 0 - Asymptomatic Vitals:   08/03/18 0844  BP: (!) 145/79  Pulse: 83  Resp: 18  Temp: (!) 96.1 F (35.6 C)   Filed Weights   08/03/18 0844  Weight: 162 lb 1.6 oz (73.5 kg)    Physical Exam Constitutional:      General: She is not in acute distress. HENT:     Head: Normocephalic and atraumatic.     Mouth/Throat:      Comments: No thrush Eyes:     General: No scleral icterus.    Pupils: Pupils are equal, round, and reactive to light.  Neck:     Musculoskeletal: Normal range of motion and neck supple.  Cardiovascular:     Rate and Rhythm: Normal rate and regular rhythm.     Heart sounds: Normal heart sounds.  Pulmonary:     Effort: Pulmonary effort is normal. No respiratory distress.     Breath sounds: No wheezing.  Abdominal:  General: Bowel sounds are normal. There is no distension.     Palpations: Abdomen is soft. There is no mass.     Tenderness: There is no abdominal tenderness.  Musculoskeletal: Normal range of motion.        General: No deformity.  Skin:    General: Skin is warm and dry.     Findings: No erythema or rash.  Neurological:     Mental Status: She is alert and oriented to person, place, and time.     Cranial Nerves: No cranial nerve deficit.     Coordination: Coordination normal.  Psychiatric:        Behavior: Behavior normal.        Thought Content: Thought content normal.      LABORATORY DATA:  I have reviewed the data as listed Lab Results  Component Value Date   WBC 7.9 08/03/2018   HGB 9.1 (L) 08/03/2018   HCT 27.8 (L) 08/03/2018   MCV 83.5 08/03/2018   PLT 330 08/03/2018   Recent Labs    04/21/18 1046  04/29/18 0612  05/10/18 1429  07/27/18 0824 07/30/18 1020 08/03/18 0825  NA 137   < > 136   < >  --    < > 131* 131* 133*  K 3.8   < > 3.3*   < >  --    < > 3.4* 3.5 3.8  CL 100   < > 108   < >  --    < > 99 101 100  CO2 22   < > 23   < >  --    < > 24 22 22   GLUCOSE 95   < > 101*   < >  --    < > 160* 137* 138*  BUN 23   < > 9   < >  --    < > <5* <5* 12  CREATININE 1.11*   < > 0.49   < >  --    < > 0.69 0.98 1.05*  CALCIUM 10.6*   < > 9.2   < >  --    < > 8.8* 9.3 9.1  GFRNONAA 50*   < > >60   < >  --    < > >60 58* 53*  GFRAA 57*   < > >60   < >  --    < > >60 >60 >60  PROT 7.7   < >  --    < > 7.3   < > 6.9 6.5 6.8  ALBUMIN 3.9   < >  --     < > 3.8   < > 3.2* 3.2* 3.5  AST 61*   < >  --    < > 94*   < > 13* 16 27  ALT 121*   < >  --    < > 80*   < > 9 11 12   ALKPHOS 275*   < >  --    < > 200*   < > 49 66 76  BILITOT 11.7*   < > 13.7*   < > 6.2*   < > 0.4 0.5 0.5  BILIDIR 8.84*  --  8.2*  --  4.58*  --   --   --   --    < > = values in this interval not displayed.   Iron/TIBC/Ferritin/ %Sat    Component Value Date/Time   IRON 77 05/04/2018 0831   IRON  97 04/09/2018 1440   TIBC 320 05/04/2018 0831   TIBC 340 04/09/2018 1440   FERRITIN 496 (H) 05/04/2018 0831   FERRITIN 1,877 (H) 04/09/2018 1440   IRONPCTSAT 24 05/04/2018 0831   IRONPCTSAT 29 04/09/2018 1440     post op CA 19.9 is 21.    ASSESSMENT & PLAN:  1. Malignant neoplasm of head of pancreas ([place])   2. Hypomagnesemia   3. Hypokalemia   4. Drug-induced neutropenia ([facility])   5. Thrombocytopenia ([place])   6. Anemia due to antineoplastic chemotherapy   7. Chemotherapy induced diarrhea    # pT2 pN1 M0, Stage IIB pancreatic cancer S/p whipple resection.  Status post 1 cycle of FOLFIRINOX She has had GI toxicities and hematological toxicities.  Labs are reviewed and discussed with patient. Acceptable to proceed with cycle 2 FOLFIRINOX.  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 Discussed with patient that for cycle 2, I will give 50% dose of Irinotecan 50% dose of 5-FU.  Once the results come back, we will further titrate her chemotherapy regimen according to the results  #Chemotherapy-induced diarrhea has completely resolved.  Patient may restart having symptoms after today's chemotherapy.  She knows to utilize Imodium as instructed.  Also encourage oral hydration and Gatorade.  #Hypomagnesia, likely due to electrolyte wasting.  Proceed with IV magnesium 2 g today.  Also advised patient to start Slow-Mag 1 tablet daily. #Hypo natremia, improved.  Sodium is 133 today.  Continue to  monitor.  She will receive 1 L of IV normal saline on day 3 for hydration.    #Neutropenia secondary to chemotherapy, resolved.  Patient will receive Onpro-Neulasta.  Monitor cbc weekly.   #Anemia, secondary to chemotherapy.  Stable continue to monitor.  #Grade 3 thrombocytopenia, due to chemotherapy.  Resolved.  Aspirin is on hold as she may get low platelet counts during current cycle of chemotherapy again.   Return to clinic with lab, MD assessment, IV fluid and magnesium in 1 week.  Earlie Server, MD, PhD Hematology Oncology [facility] at [facility] Pager- [phone] 08/03/2018

## 2018-08-04 LAB — MISC LABCORP TEST (SEND OUT): Labcorp test code: 511200

## 2018-08-05 ENCOUNTER — Inpatient Hospital Stay: Payer: Medicare Other

## 2018-08-05 ENCOUNTER — Encounter: Payer: Self-pay | Admitting: Genetics

## 2018-08-05 ENCOUNTER — Inpatient Hospital Stay (HOSPITAL_BASED_OUTPATIENT_CLINIC_OR_DEPARTMENT_OTHER): Payer: Medicare Other | Admitting: Genetics

## 2018-08-05 VITALS — BP 128/82 | HR 77 | Temp 96.7°F | Resp 18

## 2018-08-05 DIAGNOSIS — E876 Hypokalemia: Secondary | ICD-10-CM | POA: Diagnosis not present

## 2018-08-05 DIAGNOSIS — R42 Dizziness and giddiness: Secondary | ICD-10-CM | POA: Diagnosis not present

## 2018-08-05 DIAGNOSIS — Z79899 Other long term (current) drug therapy: Secondary | ICD-10-CM | POA: Diagnosis not present

## 2018-08-05 DIAGNOSIS — H538 Other visual disturbances: Secondary | ICD-10-CM | POA: Diagnosis not present

## 2018-08-05 DIAGNOSIS — Z1379 Encounter for other screening for genetic and chromosomal anomalies: Secondary | ICD-10-CM

## 2018-08-05 DIAGNOSIS — E871 Hypo-osmolality and hyponatremia: Secondary | ICD-10-CM | POA: Diagnosis not present

## 2018-08-05 DIAGNOSIS — K30 Functional dyspepsia: Secondary | ICD-10-CM | POA: Diagnosis not present

## 2018-08-05 DIAGNOSIS — E039 Hypothyroidism, unspecified: Secondary | ICD-10-CM | POA: Diagnosis not present

## 2018-08-05 DIAGNOSIS — C250 Malignant neoplasm of head of pancreas: Secondary | ICD-10-CM

## 2018-08-05 DIAGNOSIS — K219 Gastro-esophageal reflux disease without esophagitis: Secondary | ICD-10-CM | POA: Diagnosis not present

## 2018-08-05 DIAGNOSIS — R531 Weakness: Secondary | ICD-10-CM | POA: Diagnosis not present

## 2018-08-05 DIAGNOSIS — D6481 Anemia due to antineoplastic chemotherapy: Secondary | ICD-10-CM | POA: Diagnosis not present

## 2018-08-05 DIAGNOSIS — K521 Toxic gastroenteritis and colitis: Secondary | ICD-10-CM | POA: Diagnosis not present

## 2018-08-05 DIAGNOSIS — Z7982 Long term (current) use of aspirin: Secondary | ICD-10-CM | POA: Diagnosis not present

## 2018-08-05 DIAGNOSIS — R7989 Other specified abnormal findings of blood chemistry: Secondary | ICD-10-CM | POA: Diagnosis not present

## 2018-08-05 DIAGNOSIS — T451X5S Adverse effect of antineoplastic and immunosuppressive drugs, sequela: Secondary | ICD-10-CM | POA: Diagnosis not present

## 2018-08-05 DIAGNOSIS — R5383 Other fatigue: Secondary | ICD-10-CM | POA: Diagnosis not present

## 2018-08-05 DIAGNOSIS — D6959 Other secondary thrombocytopenia: Secondary | ICD-10-CM | POA: Diagnosis not present

## 2018-08-05 DIAGNOSIS — D701 Agranulocytosis secondary to cancer chemotherapy: Secondary | ICD-10-CM | POA: Diagnosis not present

## 2018-08-05 DIAGNOSIS — Z5111 Encounter for antineoplastic chemotherapy: Secondary | ICD-10-CM | POA: Diagnosis not present

## 2018-08-05 DIAGNOSIS — I10 Essential (primary) hypertension: Secondary | ICD-10-CM | POA: Diagnosis not present

## 2018-08-05 DIAGNOSIS — E785 Hyperlipidemia, unspecified: Secondary | ICD-10-CM | POA: Diagnosis not present

## 2018-08-05 MED ORDER — SODIUM CHLORIDE 0.9 % IV SOLN
Freq: Once | INTRAVENOUS | Status: AC
  Administered 2018-08-05: 15:00:00 via INTRAVENOUS
  Filled 2018-08-05: qty 250

## 2018-08-05 MED ORDER — PEGFILGRASTIM 6 MG/0.6ML ~~LOC~~ PSKT
6.0000 mg | PREFILLED_SYRINGE | Freq: Once | SUBCUTANEOUS | Status: AC
  Administered 2018-08-05: 6 mg via SUBCUTANEOUS
  Filled 2018-08-05: qty 0.6

## 2018-08-05 MED ORDER — HEPARIN SOD (PORK) LOCK FLUSH 100 UNIT/ML IV SOLN
500.0000 [IU] | Freq: Once | INTRAVENOUS | Status: AC | PRN
  Administered 2018-08-05: 500 [IU]
  Filled 2018-08-05: qty 5

## 2018-08-05 MED ORDER — SODIUM CHLORIDE 0.9% FLUSH
10.0000 mL | INTRAVENOUS | Status: DC | PRN
  Filled 2018-08-05: qty 10

## 2018-08-05 NOTE — Progress Notes (Signed)
REFERRING PROVIDER: Earlie Server, MD Lynn Taylor, Lynn Taylor [id number]  PRIMARY PROVIDER:  Jodelle Green, FNP  PRIMARY REASON FOR VISIT:  No diagnosis found.   HISTORY OF PRESENT ILLNESS:   Lynn Taylor, a 73 y.o. female, was seen for a [facility] cancer genetics consultation at the request of Dr. Tasia Catchings due to a personal history of cancer.  Lynn Taylor presents to clinic today to discuss the possibility of a hereditary predisposition to cancer, genetic testing, and to further clarify her future cancer risks, as well as potential cancer risks for family members.   In Dec 2020, at the age of 82, Lynn Taylor was diagnosed with pancreatic cancer.  She underwent whipple resection on 05/25/2018. She is currently undergoing adjuvant chemotherapy.    CANCER HISTORY:    Malignant neoplasm of head of pancreas ([place])   06/08/2018 Initial Diagnosis    Malignant neoplasm of head of pancreas ([place])    07/13/2018 -  Chemotherapy    The patient had palonosetron (ALOXI) injection 0.25 mg, 0.25 mg, Intravenous,  Once, 2 of 12 cycles Administration: 0.25 mg (07/13/2018), 0.25 mg (08/03/2018) pegfilgrastim (NEULASTA ONPRO KIT) injection 6 mg, 6 mg, Subcutaneous, Once, 2 of 12 cycles Administration: 6 mg (07/15/2018) irinotecan (CAMPTOSAR) 340 mg in dextrose 5 % 500 mL chemo infusion, 180 mg/m2 = 340 mg, Intravenous,  Once, 2 of 12 cycles Dose modification: 65 mg/m2 (original dose 180 mg/m2, Cycle 2, Reason: Dose not tolerated) Administration: 340 mg (07/13/2018), 120 mg (08/03/2018) leucovorin 750 mg in dextrose 5 % 250 mL infusion, 752 mg, Intravenous,  Once, 2 of 12 cycles Administration: 750 mg (07/13/2018), 750 mg (08/03/2018) oxaliplatin (ELOXATIN) 150 mg in dextrose 5 % 500 mL chemo infusion, 160 mg, Intravenous,  Once, 2 of 12 cycles Dose modification: 75 mg/m2 (original dose 85 mg/m2, Cycle 2, Reason: Dose not tolerated) Administration: 150 mg (07/13/2018), 135 mg (08/03/2018) fluorouracil (ADRUCIL) chemo  injection 750 mg, 400 mg/m2 = 750 mg, Intravenous,  Once, 2 of 12 cycles Dose modification: 200 mg/m2 (original dose 400 mg/m2, Cycle 2, Reason: Dose not tolerated) Administration: 750 mg (07/13/2018), 350 mg (08/03/2018) fluorouracil (ADRUCIL) 4,500 mg in sodium chloride 0.9 % 60 mL chemo infusion, 2,400 mg/m2 = 4,500 mg, Intravenous, 1 Day/Dose, 2 of 12 cycles Dose modification: 1,200 mg/m2 (original dose 2,400 mg/m2, Cycle 2, Reason: Dose not tolerated) Administration: 4,500 mg (07/13/2018), 2,250 mg (08/03/2018)  for chemotherapy treatment.       HORMONAL RISK FACTORS:  Menarche was at age 63.  First live birth at age 28/19.  OCP use for approximately 0 years.  Ovaries intact: yes.  Hysterectomy: no.  Menopausal status: postmenopausal.  HRT use: 0 years. Colonoscopy: yes; a few polyps, goes every 5 years. Mammogram within the last year: yes. Number of breast biopsies: 0.  Past Medical History:  Diagnosis Date  . Cancer ([place])   . GERD (gastroesophageal reflux disease)   . Hypertension   . Hypothyroidism   . Malignant neoplasm of head of pancreas ([place]) 06/08/2018    Past Surgical History:  Procedure Laterality Date  . CHOLECYSTECTOMY    . COLONOSCOPY N/A 12/02/2016   Procedure: COLONOSCOPY;  Surgeon: Lollie Sails, MD;  [facility] END[facility]COPY;  Service: Endoscopy;  Laterality: N/A;  . ERCP N/A 04/30/2018   Procedure: ENDOSCOPIC RETROGRADE CHOLANGIOPANCREATOGRAPHY (ERCP);  Surgeon: Lucilla Lame, MD;  Location: [facility] ENDOSCOPY;  Service: Endoscopy;  Laterality: N/A;  . ERCP N/A 05/05/2018   Procedure: ENDOSCOPIC RETROGRADE CHOLANGIOPANCREATOGRAPHY (ERCP);  Surgeon:  Lucilla Lame, MD;  Location: [place] ENDOSCOPY;  Service: Endoscopy;  Laterality: N/A;  . PORTA CATH INSERTION N/A 06/28/2018   Procedure: PORTA CATH INSERTION;  Surgeon: Algernon Huxley, MD;  Location: [place] CV LAB;  Service: Cardiovascular;  Laterality: N/A;    Social History   Socioeconomic History  .  Marital status: Married    Spouse name: Not on file  . Number of children: Not on file  . Years of education: Not on file  . Highest education level: Not on file  Occupational History  . Not on file  Social Needs  . Financial resource strain: Not on file  . Food insecurity:    Worry: Not on file    Inability: Not on file  . Transportation needs:    Medical: Not on file    Non-medical: Not on file  Tobacco Use  . Smoking status: Former Smoker    Last attempt to quit: 04/14/1988    Years since quitting: 30.3  . Smokeless tobacco: Never Used  Substance and Sexual Activity  . Alcohol use: Yes    Comment: social drinker  . Drug use: No  . Sexual activity: Not Currently  Lifestyle  . Physical activity:    Days per week: Not on file    Minutes per session: Not on file  . Stress: Not on file  Relationships  . Social connections:    Talks on phone: Not on file    Gets together: Not on file    Attends religious service: Not on file    Active member of club or organization: Not on file    Attends meetings of clubs or organizations: Not on file    Relationship status: Not on file  Other Topics Concern  . Not on file  Social History Narrative  . Not on file     FAMILY HISTORY:  We obtained a detailed, 4-generation family history.  Significant diagnoses are listed below: Family History  Problem Relation Age of Onset  . Diabetes Mellitus I Other   . Alcoholism Other   . Hypertension Other   . Hyperlipidemia Other   . Coronary artery disease Other   . Stroke Other   . Osteoarthritis Other   . Migraines Other   . Heart Problems Mother   . Stroke Sister   . CAD Brother   . Stroke Brother   . Breast cancer Neg Hx    Lynn Taylor is adopted, so she has limited information about her family.  Lynn Taylor has 2 sons and 1 daughter (ages 46, 75, 47).  Lynn Taylor has 2 maternal half brothers and 2 maternal half-sisters with no hx of cancer.    Lynn Taylor father: died in [place]  war Paternal Aunts/Uncles: 1 uncle and 3 sisters, no info Paternal cousins: no info Paternal grandfather: no info Paternal grandmother:no info  Ms. Carpenter's mother: deceased, no info Maternal Aunts/Uncles: patient has uncles, no known info Maternal cousins: unk Maternal grandfather: unk Maternal grandmother:unk  Ms. Lennartz is unaware of previous family history of genetic testing for hereditary cancer risks. Patient's maternal ancestors are of African American descent, and paternal ancestors are of African American descent. There is no reported Ashkenazi Jewish ancestry. There is no known consanguinity.  GENETIC COUNSELING ASSESSMENT: Lynn Taylor is a 73 y.o. 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 We, therefore, discussed and recommended the following at today's visit.   DISCUSSION: We reviewed the characteristics,  features and inheritance patterns of hereditary cancer syndromes. We also discussed genetic testing, including the appropriate family members to test, the process of testing, insurance coverage and turn-around-time for results. We discussed the implications of a negative, positive and/or variant of uncertain significant result. We recommended Lynn Taylor pursue genetic testing for the Multi-Cancer panel.  The Multi-Cancer Panel offered by [facility] includes sequencing and/or deletion duplication testing of the following 84 genes: AIP,ALK, APC, ATM, AXIN2,BAP1,  BARD1, BLM, BMPR1A, BRCA1, BRCA2, BRIP1, CASR, CDC73, CDH1, CDK4, CDKN1B, CDKN1C, CDKN2A (p14ARF), CDKN2A (p16INK4a), CEBPA, CHEK2, CTNNA1, DICER1, DIS3L2, EGFR (c.2369C>T, p.Thr790Met variant only), EPCAM (Deletion/duplication testing only), FH, FLCN, GATA2, GPC3, GREM1 (Promoter region deletion/duplication testing only), HOXB13 (c.251G>A, p.Gly84Glu), HRAS, KIT, MAX, MEN1, MET, MITF (c.952G>A, p.Glu318Lys variant only), MLH1, MSH2, MSH3, MSH6, MUTYH, NBN, NF1, NF2,  NTHL1, PALB2, PDGFRA, PHOX2B, PMS2, POLD1, POLE, POT1, PRKAR1A, PTCH1, PTEN, RAD50, RAD51C, RAD51D, RB1, RECQL4, RET, RUNX1, SDHAF2, SDHA (sequence changes only), SDHB, SDHC, SDHD, SMAD4, SMARCA4, SMARCB1, SMARCE1, STK11, SUFU, TERC, TERT, TMEM127, TP53, TSC1, TSC2, VHL, WRN and WT1.    We discussed that only 5-10% of cancers are associated with a Hereditary cancer predisposition syndrome.  We briefly discussed that there are multiple genes that each have their own pattern of increased cancer risk.    We discussed that if she is found to have a mutation in one of these genes, it may impact future medical management recommendations such as increased cancer screenings and consideration of risk reducing surgeries.  A positive result could also have implications for the patient's family members.  A Negative result would mean we were unable to identify a hereditary component to her cancer, but does not rule out the possibility of a hereditary basis for her cancer.  There could be mutations that are undetectable by current technology, or in genes not yet tested or identified to increase cancer risk.    We discussed the potential to find a Variant of Uncertain Significance or VUS.  These are variants that have not yet been identified as pathogenic or benign, and it is unknown if this variant is associated with increased cancer risk or if this is a normal finding.  Most VUS's are reclassified to benign or likely benign.   It should not be used to make medical management decisions. With time, we suspect the lab will determine the significance of any VUS's identified if any.   Based on Lynn Taylor's personal history of cancer, she meets medical criteria for genetic testing. Despite that she meets criteria, she may still have an out of pocket cost. The laboratory can provide her with an estimate of her OOP cost.  she was given the contact information for the laboratory if she has further questions. Marland Kitchen   PLAN: After  considering the risks, benefits, and limitations, Lynn Taylor  provided informed consent to pursue genetic testing and the blood sample was sent to [facility] for analysis of the Multi-cancer panel. Results should be available within approximately 2-3 weeks' time, at which point they will be disclosed by telephone to Lynn Taylor, as will any additional recommendations warranted by these results. Lynn Taylor will receive a summary of her genetic counseling visit and a copy of her results once available. This information will also be available in Epic. We encouraged Lynn Taylor to remain in contact with cancer genetics annually so that we can continuously update the family history and inform her of any changes in cancer genetics and testing that may be of benefit  for her family. Lynn Taylor questions were answered to her satisfaction today. Our contact information was provided should additional questions or concerns arise.  Lastly, we encouraged Ms. Brusseau to remain in contact with cancer genetics annually so that we can continuously update the family history and inform her of any changes in cancer genetics and testing that may be of benefit for this family.   Ms.  Braun questions were answered to her satisfaction today. Our contact information was provided should additional questions or concerns arise. Thank you for the referral and allowing [place] to share in the care of your patient.   Tana Felts, MS, [facility] Certified Genetic Counselor [email or web address] phone: [phone]  The patient was seen for a total of 30 minutes in face-to-face genetic counseling.  Geneic Counselor, Elmyra Ricks was also present and co-counseled. Dr. Grayland Ormond was available for questions regarding this case.

## 2018-08-06 LAB — MISC LABCORP TEST (SEND OUT): Labcorp test code: 511176

## 2018-08-06 NOTE — Addendum Note (Signed)
Addended by: Tana Felts on: 08/06/2018 03:14 PM   Modules accepted: Orders

## 2018-08-10 ENCOUNTER — Inpatient Hospital Stay: Payer: Medicare Other | Attending: Oncology

## 2018-08-10 ENCOUNTER — Encounter: Payer: Self-pay | Admitting: Oncology

## 2018-08-10 ENCOUNTER — Inpatient Hospital Stay: Payer: Medicare Other

## 2018-08-10 ENCOUNTER — Inpatient Hospital Stay (HOSPITAL_BASED_OUTPATIENT_CLINIC_OR_DEPARTMENT_OTHER): Payer: Medicare Other | Admitting: Oncology

## 2018-08-10 ENCOUNTER — Other Ambulatory Visit: Payer: Self-pay

## 2018-08-10 VITALS — BP 111/72 | HR 114 | Temp 97.2°F | Ht 62.0 in | Wt 153.1 lb

## 2018-08-10 DIAGNOSIS — K219 Gastro-esophageal reflux disease without esophagitis: Secondary | ICD-10-CM | POA: Diagnosis not present

## 2018-08-10 DIAGNOSIS — E8889 Other specified metabolic disorders: Secondary | ICD-10-CM | POA: Insufficient documentation

## 2018-08-10 DIAGNOSIS — E871 Hypo-osmolality and hyponatremia: Secondary | ICD-10-CM | POA: Insufficient documentation

## 2018-08-10 DIAGNOSIS — I10 Essential (primary) hypertension: Secondary | ICD-10-CM | POA: Insufficient documentation

## 2018-08-10 DIAGNOSIS — Z7689 Persons encountering health services in other specified circumstances: Secondary | ICD-10-CM | POA: Diagnosis not present

## 2018-08-10 DIAGNOSIS — R634 Abnormal weight loss: Secondary | ICD-10-CM | POA: Diagnosis not present

## 2018-08-10 DIAGNOSIS — Z79899 Other long term (current) drug therapy: Secondary | ICD-10-CM

## 2018-08-10 DIAGNOSIS — T451X5S Adverse effect of antineoplastic and immunosuppressive drugs, sequela: Secondary | ICD-10-CM | POA: Diagnosis not present

## 2018-08-10 DIAGNOSIS — K521 Toxic gastroenteritis and colitis: Secondary | ICD-10-CM | POA: Insufficient documentation

## 2018-08-10 DIAGNOSIS — R5383 Other fatigue: Secondary | ICD-10-CM | POA: Diagnosis not present

## 2018-08-10 DIAGNOSIS — Z87891 Personal history of nicotine dependence: Secondary | ICD-10-CM | POA: Insufficient documentation

## 2018-08-10 DIAGNOSIS — C250 Malignant neoplasm of head of pancreas: Secondary | ICD-10-CM

## 2018-08-10 DIAGNOSIS — D6481 Anemia due to antineoplastic chemotherapy: Secondary | ICD-10-CM | POA: Insufficient documentation

## 2018-08-10 DIAGNOSIS — Z5111 Encounter for antineoplastic chemotherapy: Secondary | ICD-10-CM | POA: Insufficient documentation

## 2018-08-10 DIAGNOSIS — T451X5A Adverse effect of antineoplastic and immunosuppressive drugs, initial encounter: Secondary | ICD-10-CM

## 2018-08-10 DIAGNOSIS — E876 Hypokalemia: Secondary | ICD-10-CM

## 2018-08-10 DIAGNOSIS — E039 Hypothyroidism, unspecified: Secondary | ICD-10-CM | POA: Diagnosis not present

## 2018-08-10 LAB — CBC WITH DIFFERENTIAL/PLATELET
Abs Immature Granulocytes: 0.71 10*3/uL — ABNORMAL HIGH (ref 0.00–0.07)
Basophils Absolute: 0.2 10*3/uL — ABNORMAL HIGH (ref 0.0–0.1)
Basophils Relative: 1 %
Eosinophils Absolute: 0 10*3/uL (ref 0.0–0.5)
Eosinophils Relative: 0 %
HCT: 31.5 % — ABNORMAL LOW (ref 36.0–46.0)
Hemoglobin: 10.4 g/dL — ABNORMAL LOW (ref 12.0–15.0)
Immature Granulocytes: 4 %
Lymphocytes Relative: 20 %
Lymphs Abs: 3.8 10*3/uL (ref 0.7–4.0)
MCH: 27.3 pg (ref 26.0–34.0)
MCHC: 33 g/dL (ref 30.0–36.0)
MCV: 82.7 fL (ref 80.0–100.0)
Monocytes Absolute: 1.2 10*3/uL — ABNORMAL HIGH (ref 0.1–1.0)
Monocytes Relative: 6 %
Neutro Abs: 13.4 10*3/uL — ABNORMAL HIGH (ref 1.7–7.7)
Neutrophils Relative %: 69 %
Platelets: 399 10*3/uL (ref 150–400)
RBC: 3.81 MIL/uL — ABNORMAL LOW (ref 3.87–5.11)
RDW: 15.5 % (ref 11.5–15.5)
WBC: 19.4 10*3/uL — ABNORMAL HIGH (ref 4.0–10.5)
nRBC: 0 % (ref 0.0–0.2)

## 2018-08-10 LAB — COMPREHENSIVE METABOLIC PANEL
ALT: 11 U/L (ref 0–44)
AST: 25 U/L (ref 15–41)
Albumin: 3.7 g/dL (ref 3.5–5.0)
Alkaline Phosphatase: 137 U/L — ABNORMAL HIGH (ref 38–126)
Anion gap: 9 (ref 5–15)
BUN: 11 mg/dL (ref 8–23)
CO2: 23 mmol/L (ref 22–32)
Calcium: 9.6 mg/dL (ref 8.9–10.3)
Chloride: 99 mmol/L (ref 98–111)
Creatinine, Ser: 0.85 mg/dL (ref 0.44–1.00)
GFR calc Af Amer: >60 mL/min (ref >60)
GFR calc non Af Amer: >60 mL/min (ref >60)
Glucose, Bld: 122 mg/dL — ABNORMAL HIGH (ref 70–99)
Potassium: 4.2 mmol/L (ref 3.5–5.1)
Sodium: 131 mmol/L — ABNORMAL LOW (ref 135–145)
Total Bilirubin: 0.5 mg/dL (ref 0.3–1.2)
Total Protein: 7.5 g/dL (ref 6.5–8.1)

## 2018-08-10 LAB — MAGNESIUM: Magnesium: 1.8 mg/dL (ref 1.7–2.4)

## 2018-08-10 MED ORDER — SODIUM CHLORIDE 0.9 % IV SOLN
Freq: Once | INTRAVENOUS | Status: AC
  Administered 2018-08-10: 10:00:00 via INTRAVENOUS
  Filled 2018-08-10: qty 250

## 2018-08-10 MED ORDER — SODIUM CHLORIDE 0.9% FLUSH
10.0000 mL | Freq: Once | INTRAVENOUS | Status: AC
  Administered 2018-08-10: 10 mL via INTRAVENOUS
  Filled 2018-08-10: qty 10

## 2018-08-10 MED ORDER — HEPARIN SOD (PORK) LOCK FLUSH 100 UNIT/ML IV SOLN: 500.0000 [IU] | Freq: Once | INTRAVENOUS | Status: DC

## 2018-08-10 MED ORDER — HEPARIN SOD (PORK) LOCK FLUSH 100 UNIT/ML IV SOLN
500.0000 [IU] | Freq: Once | INTRAVENOUS | Status: AC | PRN
  Administered 2018-08-10: 500 [IU]

## 2018-08-10 NOTE — Progress Notes (Signed)
Patient here today for follow up.  Patient states that she had 3 loose stools yesterday, but no bowel movement the day before, uses imodium prn.

## 2018-08-10 NOTE — Progress Notes (Signed)
Hematology/Oncology follow-up note [facility] [phone] [phone]   Patient Care Team: Jodelle Green, FNP as PCP - General (Family Medicine) Clent Jacks, RN as Registered Nurse  REFERRING PROVIDER: Evie Lacks CHIEF COMPLAINTS/REASON FOR VISIT:  Follow-up for management of pancreatic cancer.  HISTORY OF PRESENTING ILLNESS:  Lynn Taylor is a  73 y.o.  female with PMH listed below who was referred to me for evaluation of evaluation of elevated ferritin. Patient recently had lab work-up done on 04/09/2018 which showed ferritin level 1877, iron 97, TIBC 340, iron saturation 29, 04/11/2018 CBC showed hemoglobin 13.2, MCV 81, WBC 7.6, platelet counts 246,000. Patient was referred to hematology for further evaluation of elevated ferritin.  #04/28/2018 Ultrasound of liver was obtained which showed CBD and intrahepatic biliary dilatation Patient wa she is s advised to go to emergency room for further evaluation. Also had hyperbilirubinemia.  Patient was complaining about being jaundiced, pruritus all over. MR abdomen MRCP with and without contrast showed market biliary duct dilatation, secondary to obstruction in the region of pancreatic head.  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 No abdominal adenopathy, liver metastasis or cross vascular involvement.  patient was evaluated by gastroenterology and status post ERCP with stenting. CA 19.9 1173 CEA 3.9 # 05/25/2018  patient was referred to [facility] and s/p whipple resection.Her postop course was c/b Type A pancreatic leak and c.diff. Pathology showed A. Hepatic artery lymph node, excision: One lymph node, negative for malignancy (0/1). B. Biliary stent, removal: Medical device, consistent with stent, gross examination only. C. 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 All margins are negative (closest margin=uncinate, 0.5 mm) Metastatic adenocarcinoma in one of thirty-two lymph nodes (1/32).  Portion of stomach and duodenum with no specific pathologic diagnosis.   Grade, 3 poorly differentiated.  Pathologic pT2 pN1  Cancer Treatment 07/13/2018 Cycle 1 FOLFIRINOX with Onpro Neulasta 08/03/2018 Cycle 1 mFOLFIRINOX with Onpro Neulasta - [50% dose for 5-FU and Irinotecan]   INTERVAL HISTORY Lynn Taylor is a 73 y.o. female who has above history reviewed by me today presents for follow up visit for tolerability of cycle 2 dose reduced modified FOLFIRINOX-while awaiting Irinotecan toxicity and DPD 5-FU toxicity test results.  Reports doing well since cycle 2 treatments.  Denies nausea vomiting.  She had intermittent loose bowels, yesterday she had 3 loose bowel movements.  She takes Imodium as instructed. She also has been started on Slow-Mag supplements.  She also takes potassium supplements. Appetite is fair.  She has lost it 10 pounds since last visit. Otherwise no new complaints.  Review of Systems  Constitutional: Positive for fatigue. Negative for appetite change, chills and fever.  HENT:   Negative for hearing loss and voice change.   Eyes: Negative for eye problems.  Respiratory: Negative for chest tightness and cough.   Cardiovascular: Negative for chest pain.  Gastrointestinal: Positive for diarrhea. Negative for abdominal distention, abdominal pain, blood in stool and nausea.  Endocrine: Negative for hot flashes.  Genitourinary: Negative for difficulty urinating, frequency and hematuria.   Musculoskeletal: Negative for arthralgias.  Skin: Negative for itching and rash.  Neurological: Negative for extremity weakness.  Hematological: Negative for adenopathy. Does not bruise/bleed easily.  Psychiatric/Behavioral: Negative for confusion.     MEDICAL HISTORY:  Past  Medical History:  Diagnosis Date  . Cancer ([place])   . GERD (gastroesophageal reflux disease)   .  Hypertension   . Hypothyroidism   . Malignant neoplasm of head of pancreas ([place]) 06/08/2018    SURGICAL HISTORY: Past Surgical History:  Procedure Laterality Date  . CHOLECYSTECTOMY    . COLONOSCOPY N/A 12/02/2016   Procedure: COLONOSCOPY;  Surgeon: Lollie Sails, MD;  Location: [facility] ENDOSCOPY;  Service: Endoscopy;  Laterality: N/A;  . ERCP N/A 04/30/2018   Procedure: ENDOSCOPIC RETROGRADE CHOLANGIOPANCREATOGRAPHY (ERCP);  Surgeon: Lucilla Lame, MD;  Location[facility] ENDOSCOPY;  Service: Endoscopy;  Laterality: N/A;  . ERCP N/A 05/05/2018   Procedure: ENDOSCOPIC RETROGRADE CHOLANGIOPANCREATOGRAPHY (ERCP);  Surgeon: Lucilla Lame, MD;  Location: [facility] ENDOSCOPY;  Service: Endoscopy;  Laterality: N/A;  . PORTA CATH INSERTION N/A 06/28/2018   Procedure: PORTA CATH INSERTION;  Surgeon: Algernon Huxley, MD;  Location: [place] CV LAB;  Service: Cardiovascular;  Laterality: N/A;    SOCIAL HISTORY: Social History   Socioeconomic History  . Marital status: Married    Spouse name: Not on file  . Number of children: Not on file  . Years of education: Not on file  . Highest education level: Not on file  Occupational History  . Not on file  Social Needs  . Financial resource strain: Not on file  . Food insecurity:    Worry: Not on file    Inability: Not on file  . Transportation needs:    Medical: Not on file    Non-medical: Not on file  Tobacco Use  . Smoking status: Former Smoker    Last attempt to quit: 04/14/1988    Years since quitting: 30.3  . Smokeless tobacco: Never Used  Substance and Sexual Activity  . Alcohol use: Yes    Comment: social drinker  . Drug use: No  . Sexual activity: Not Currently  Lifestyle  . Physical activity:    Days per week: Not on file    Minutes per session: Not on file  . Stress: Not on file  Relationships  . Social connections:    Talks on  phone: Not on file    Gets together: Not on file    Attends religious service: Not on file    Active member of club or organization: Not on file    Attends meetings of clubs or organizations: Not on file    Relationship status: Not on file  . Intimate partner violence:    Fear of current or ex partner: Not on file    Emotionally abused: Not on file    Physically abused: Not on file    Forced sexual activity: Not on file  Other Topics Concern  . Not on file  Social History Narrative  . Not on file    FAMILY HISTORY: Family History  Problem Relation Age of Onset  . Diabetes Mellitus I Other   . Alcoholism Other   . Hypertension Other   . Hyperlipidemia Other   . Coronary artery disease Other   . Stroke Other   . Osteoarthritis Other   . Migraines Other   . Heart Problems Mother   . Stroke Sister   . CAD Brother   . Stroke Brother   . Breast cancer Neg Hx     ALLERGIES:  has No Known Allergies.  MEDICATIONS:  Current Outpatient Medications  Medication Sig Dispense Refill  . chlorhexidine (PERIDEX) 0.12 % solution Use as directed 15 mLs in the mouth or throat 2 (two) times daily. 473 mL 3  . Cholecalciferol (VITAMIN D-3) 25 MCG (1000 UT) CAPS Take by mouth.    Marland Kitchen  dexamethasone (DECADRON) 4 MG tablet Take 2 tablets (8 mg total) by mouth daily. Start the day after chemo for 2 days. 8 tablet 5  . levothyroxine (SYNTHROID, LEVOTHROID) 100 MCG tablet Take 1 tablet (100 mcg total) by mouth daily before breakfast. 90 tablet 4  . loperamide (IMODIUM) 2 MG capsule Take 1 capsule (2 mg total) by mouth See admin instructions. With onset of loose stool, take 4mg  followed by 2mg  every 2 hours until 12 hours have passed without loose bowel movement. Maximum: 16 mg/day 120 capsule 1  . omeprazole (PRILOSEC) 20 MG capsule Take 1 capsule (20 mg total) by mouth daily. 30 capsule 2  . ondansetron (ZOFRAN) 4 MG tablet Take 1 tablet (4 mg total) by mouth every 6 (six) hours as needed for nausea  or vomiting. 30 tablet 3  . potassium chloride SA (K-DUR,KLOR-CON) 20 MEQ tablet Take 2 tablets (40 mEq total) by mouth daily. 28 tablet 0  . prochlorperazine (COMPAZINE) 10 MG tablet Take 1 tablet (10 mg total) by mouth every 6 (six) hours as needed (NAUSEA). 30 tablet 1  . traMADol (ULTRAM) 50 MG tablet Take 1 tablet (50 mg total) by mouth every 6 (six) hours as needed. 30 tablet 0   No current facility-administered medications for this visit.      PHYSICAL EXAMINATION: ECOG PERFORMANCE STATUS: 0 - Asymptomatic Vitals:   08/10/18 0913  BP: 111/72  Pulse: (!) 114  Temp: (!) 97.2 F (36.2 C)   Filed Weights   08/10/18 0913  Weight: 153 lb 1 oz (69.4 kg)    Physical Exam Constitutional:      General: She is not in acute distress. HENT:     Head: Normocephalic and atraumatic.     Mouth/Throat:     Comments: No thrush Eyes:     General: No scleral icterus.    Pupils: Pupils are equal, round, and reactive to light.  Neck:     Musculoskeletal: Normal range of motion and neck supple.  Cardiovascular:     Rate and Rhythm: Normal rate and regular rhythm.     Heart sounds: Normal heart sounds.  Pulmonary:     Effort: Pulmonary effort is normal. No respiratory distress.     Breath sounds: No wheezing.  Abdominal:     General: Bowel sounds are normal. There is no distension.     Palpations: Abdomen is soft. There is no mass.     Tenderness: There is no abdominal tenderness.  Musculoskeletal: Normal range of motion.        General: No deformity.  Skin:    General: Skin is warm and dry.     Findings: No erythema or rash.  Neurological:     Mental Status: She is alert and oriented to person, place, and time.     Cranial Nerves: No cranial nerve deficit.     Coordination: Coordination normal.  Psychiatric:        Behavior: Behavior normal.        Thought Content: Thought content normal.      LABORATORY DATA:  I have reviewed the data as listed Lab Results  Component  Value Date   WBC 19.4 (H) 08/10/2018   HGB 10.4 (L) 08/10/2018   HCT 31.5 (L) 08/10/2018   MCV 82.7 08/10/2018   PLT 399 08/10/2018   Recent Labs    04/21/18 1046  04/29/18 0612  05/10/18 1429  07/30/18 1020 08/03/18 0825 08/10/18 0842  NA 137   < > 136   < >  --    < >  131* 133* 131*  K 3.8   < > 3.3*   < >  --    < > 3.5 3.8 4.2  CL 100   < > 108   < >  --    < > 101 100 99  CO2 22   < > 23   < >  --    < > 22 22 23   GLUCOSE 95   < > 101*   < >  --    < > 137* 138* 122*  BUN 23   < > 9   < >  --    < > <5* 12 11  CREATININE 1.11*   < > 0.49   < >  --    < > 0.98 1.05* 0.85  CALCIUM 10.6*   < > 9.2   < >  --    < > 9.3 9.1 9.6  GFRNONAA 50*   < > >60   < >  --    < > 58* 53* >60  GFRAA 57*   < > >60   < >  --    < > >60 >60 >60  PROT 7.7   < >  --    < > 7.3   < > 6.5 6.8 7.5  ALBUMIN 3.9   < >  --    < > 3.8   < > 3.2* 3.5 3.7  AST 61*   < >  --    < > 94*   < > 16 27 25   ALT 121*   < >  --    < > 80*   < > 11 12 11   ALKPHOS 275*   < >  --    < > 200*   < > 66 76 137*  BILITOT 11.7*   < > 13.7*   < > 6.2*   < > 0.5 0.5 0.5  BILIDIR 8.84*  --  8.2*  --  4.58*  --   --   --   --    < > = values in this interval not displayed.   Iron/TIBC/Ferritin/ %Sat    Component Value Date/Time   IRON 77 05/04/2018 0831   IRON 97 04/09/2018 1440   TIBC 320 05/04/2018 0831   TIBC 340 04/09/2018 1440   FERRITIN 496 (H) 05/04/2018 0831   FERRITIN 1,877 (H) 04/09/2018 1440   IRONPCTSAT 24 05/04/2018 0831   IRONPCTSAT 29 04/09/2018 1440     post op CA 19.9 is 21.    ASSESSMENT & PLAN:  1. Malignant neoplasm of head of pancreas ([place])   2. Chemotherapy induced diarrhea   3. Hypomagnesemia   4. Hyponatremia   5. Hypokalemia    # pT2 pN1 M0, Stage IIB pancreatic cancer S/p whipple resection.  Status post 1 cycle of FOLFIRINOX She has had GI toxicities and hematological toxicities after cycle 1 FOLFIRINOX. Received cycle 2 modified FOLFIRINOX with 50% dose reduce of 5-FU and  Irinotecan while waiting for results from UGT1A1*28 allele status [UGT1A1 Irinotecan Toxicity] - DPD 5-Fluorouracil Toxicity   Labs reviewed and discussed with patient. Potassium and magnesium level have been stable.  Advised patient to continue take her potassium supplements as well as Slow-Mag supplements. No need for IV electrolyte repleted today. Hyponatremia, will proceed with 1 L of fluid. Weight loss secondary to decreased oral intake and diarrhea.  Encourage oral hydration.  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  Negative for DPD mutation. Discussed with patient that at the next cycle of treatment, plan to go back to full dose 5-FU.  Plan 50% of planned irinotecan dose given her heterozygous UGT1A1*28 allele status.  #Chemotherapy-induced diarrhea continue to utilize Imodium as instructed.  Also encourage oral hydration and Gatorade.   #Anemia, secondary to chemotherapy.  Stable continue to monitor.  Aspirin is on hold as she may get low platelet counts during current cycle of chemotherapy again.   Return to clinic with lab, MD assessment, next cycle of treatment in 1 week.  Earlie Server, MD, PhD Hematology Oncology [facility] at [facility] Pager- [phone] 08/10/2018

## 2018-08-11 ENCOUNTER — Other Ambulatory Visit: Payer: Self-pay | Admitting: *Deleted

## 2018-08-11 MED ORDER — POTASSIUM CHLORIDE CRYS ER 20 MEQ PO TBCR: 40.0000 meq | EXTENDED_RELEASE_TABLET | Freq: Every day | ORAL | 0 refills | Status: DC

## 2018-08-11 NOTE — Telephone Encounter (Signed)
Patient called reporting that she only has 2 potassium tabs left. Does she need a refill?     Ref Range & Units 1d ago 8d ago  Potassium 3.5 - 5.1 mmol/L 4.2  3.8

## 2018-08-18 ENCOUNTER — Inpatient Hospital Stay: Payer: Medicare Other

## 2018-08-18 ENCOUNTER — Encounter: Payer: Self-pay | Admitting: Oncology

## 2018-08-18 ENCOUNTER — Inpatient Hospital Stay (HOSPITAL_BASED_OUTPATIENT_CLINIC_OR_DEPARTMENT_OTHER): Payer: Medicare Other | Admitting: Oncology

## 2018-08-18 ENCOUNTER — Other Ambulatory Visit: Payer: Self-pay

## 2018-08-18 VITALS — BP 126/78 | HR 107 | Temp 96.5°F | Resp 18 | Wt 154.0 lb

## 2018-08-18 DIAGNOSIS — K521 Toxic gastroenteritis and colitis: Secondary | ICD-10-CM

## 2018-08-18 DIAGNOSIS — C250 Malignant neoplasm of head of pancreas: Secondary | ICD-10-CM | POA: Diagnosis not present

## 2018-08-18 DIAGNOSIS — E876 Hypokalemia: Secondary | ICD-10-CM

## 2018-08-18 DIAGNOSIS — D6481 Anemia due to antineoplastic chemotherapy: Secondary | ICD-10-CM

## 2018-08-18 DIAGNOSIS — K219 Gastro-esophageal reflux disease without esophagitis: Secondary | ICD-10-CM

## 2018-08-18 DIAGNOSIS — E039 Hypothyroidism, unspecified: Secondary | ICD-10-CM

## 2018-08-18 DIAGNOSIS — R5383 Other fatigue: Secondary | ICD-10-CM

## 2018-08-18 DIAGNOSIS — R634 Abnormal weight loss: Secondary | ICD-10-CM

## 2018-08-18 DIAGNOSIS — E871 Hypo-osmolality and hyponatremia: Secondary | ICD-10-CM

## 2018-08-18 DIAGNOSIS — I10 Essential (primary) hypertension: Secondary | ICD-10-CM

## 2018-08-18 DIAGNOSIS — T451X5A Adverse effect of antineoplastic and immunosuppressive drugs, initial encounter: Secondary | ICD-10-CM

## 2018-08-18 DIAGNOSIS — E8889 Other specified metabolic disorders: Secondary | ICD-10-CM

## 2018-08-18 DIAGNOSIS — T451X5S Adverse effect of antineoplastic and immunosuppressive drugs, sequela: Secondary | ICD-10-CM

## 2018-08-18 DIAGNOSIS — Z95828 Presence of other vascular implants and grafts: Secondary | ICD-10-CM

## 2018-08-18 DIAGNOSIS — Z87891 Personal history of nicotine dependence: Secondary | ICD-10-CM

## 2018-08-18 DIAGNOSIS — Z79899 Other long term (current) drug therapy: Secondary | ICD-10-CM

## 2018-08-18 LAB — COMPREHENSIVE METABOLIC PANEL
ALT: 14 U/L (ref 0–44)
AST: 28 U/L (ref 15–41)
Albumin: 3.7 g/dL (ref 3.5–5.0)
Alkaline Phosphatase: 125 U/L (ref 38–126)
Anion gap: 8 (ref 5–15)
BUN: 14 mg/dL (ref 8–23)
CO2: 19 mmol/L — ABNORMAL LOW (ref 22–32)
Calcium: 9.4 mg/dL (ref 8.9–10.3)
Chloride: 105 mmol/L (ref 98–111)
Creatinine, Ser: 0.92 mg/dL (ref 0.44–1.00)
GFR calc Af Amer: >60 mL/min (ref >60)
GFR calc non Af Amer: >60 mL/min (ref >60)
Glucose, Bld: 127 mg/dL — ABNORMAL HIGH (ref 70–99)
Potassium: 3.9 mmol/L (ref 3.5–5.1)
Sodium: 132 mmol/L — ABNORMAL LOW (ref 135–145)
Total Bilirubin: 0.5 mg/dL (ref 0.3–1.2)
Total Protein: 7.3 g/dL (ref 6.5–8.1)

## 2018-08-18 LAB — CBC WITH DIFFERENTIAL/PLATELET
Abs Immature Granulocytes: 0.6 10*3/uL — ABNORMAL HIGH (ref 0.00–0.07)
Basophils Absolute: 0.1 10*3/uL (ref 0.0–0.1)
Basophils Relative: 0 %
Eosinophils Absolute: 0.1 10*3/uL (ref 0.0–0.5)
Eosinophils Relative: 0 %
HCT: 32.3 % — ABNORMAL LOW (ref 36.0–46.0)
Hemoglobin: 10.4 g/dL — ABNORMAL LOW (ref 12.0–15.0)
Immature Granulocytes: 3 %
Lymphocytes Relative: 18 %
Lymphs Abs: 3.4 10*3/uL (ref 0.7–4.0)
MCH: 26.6 pg (ref 26.0–34.0)
MCHC: 32.2 g/dL (ref 30.0–36.0)
MCV: 82.6 fL (ref 80.0–100.0)
Monocytes Absolute: 0.9 10*3/uL (ref 0.1–1.0)
Monocytes Relative: 5 %
Neutro Abs: 14.2 10*3/uL — ABNORMAL HIGH (ref 1.7–7.7)
Neutrophils Relative %: 74 %
Platelets: 249 10*3/uL (ref 150–400)
RBC: 3.91 MIL/uL (ref 3.87–5.11)
RDW: 17 % — ABNORMAL HIGH (ref 11.5–15.5)
WBC: 19.3 10*3/uL — ABNORMAL HIGH (ref 4.0–10.5)
nRBC: 0.2 % (ref 0.0–0.2)

## 2018-08-18 LAB — MAGNESIUM: Magnesium: 1.6 mg/dL — ABNORMAL LOW (ref 1.7–2.4)

## 2018-08-18 MED ORDER — DEXTROSE 5 % IV SOLN
Freq: Once | INTRAVENOUS | Status: AC
  Administered 2018-08-18: 10:00:00 via INTRAVENOUS
  Filled 2018-08-18: qty 250

## 2018-08-18 MED ORDER — FLUOROURACIL CHEMO INJECTION 2.5 GM/50ML
400.0000 mg/m2 | Freq: Once | INTRAVENOUS | Status: AC
  Administered 2018-08-18: 750 mg via INTRAVENOUS
  Filled 2018-08-18: qty 15

## 2018-08-18 MED ORDER — SODIUM CHLORIDE 0.9 % IV SOLN
Freq: Once | INTRAVENOUS | Status: AC
  Administered 2018-08-18: 09:00:00 via INTRAVENOUS
  Filled 2018-08-18: qty 250

## 2018-08-18 MED ORDER — SODIUM CHLORIDE 0.9 % IV SOLN: 2.0000 g | Freq: Once | INTRAVENOUS | Status: DC

## 2018-08-18 MED ORDER — MAGNESIUM SULFATE 2 GM/50ML IV SOLN
2.0000 g | Freq: Once | INTRAVENOUS | Status: AC
  Administered 2018-08-18: 2 g via INTRAVENOUS

## 2018-08-18 MED ORDER — SODIUM CHLORIDE 0.9% FLUSH
10.0000 mL | Freq: Once | INTRAVENOUS | Status: AC
  Administered 2018-08-18: 10 mL via INTRAVENOUS
  Filled 2018-08-18: qty 10

## 2018-08-18 MED ORDER — ATROPINE SULFATE 1 MG/ML IJ SOLN
0.5000 mg | Freq: Once | INTRAMUSCULAR | Status: AC | PRN
  Administered 2018-08-18: 0.5 mg via INTRAVENOUS
  Filled 2018-08-18: qty 1

## 2018-08-18 MED ORDER — LEUCOVORIN CALCIUM INJECTION 350 MG
399.0000 mg/m2 | Freq: Once | INTRAVENOUS | Status: AC
  Administered 2018-08-18: 750 mg via INTRAVENOUS
  Filled 2018-08-18: qty 25

## 2018-08-18 MED ORDER — PALONOSETRON HCL INJECTION 0.25 MG/5ML
0.2500 mg | Freq: Once | INTRAVENOUS | Status: AC
  Administered 2018-08-18: 0.25 mg via INTRAVENOUS
  Filled 2018-08-18: qty 5

## 2018-08-18 MED ORDER — OXALIPLATIN CHEMO INJECTION 100 MG/20ML
80.0000 mg/m2 | Freq: Once | INTRAVENOUS | Status: AC
  Administered 2018-08-18: 150 mg via INTRAVENOUS
  Filled 2018-08-18: qty 20

## 2018-08-18 MED ORDER — SODIUM CHLORIDE 0.9 % IV SOLN
2400.0000 mg/m2 | INTRAVENOUS | Status: DC
  Administered 2018-08-18: 4500 mg via INTRAVENOUS
  Filled 2018-08-18: qty 90

## 2018-08-18 MED ORDER — SODIUM CHLORIDE 0.9 % IV SOLN
INTRAVENOUS | Status: DC
  Administered 2018-08-18: 10:00:00 via INTRAVENOUS
  Filled 2018-08-18: qty 250

## 2018-08-18 MED ORDER — MAGNESIUM SULFATE 2 GM/50ML IV SOLN
INTRAVENOUS | Status: AC
  Filled 2018-08-18: qty 50

## 2018-08-18 MED ORDER — DEXAMETHASONE SODIUM PHOSPHATE 10 MG/ML IJ SOLN
10.0000 mg | Freq: Once | INTRAMUSCULAR | Status: AC
  Administered 2018-08-18: 10 mg via INTRAVENOUS
  Filled 2018-08-18: qty 1

## 2018-08-18 MED ORDER — IRINOTECAN HCL CHEMO INJECTION 100 MG/5ML
65.0000 mg/m2 | Freq: Once | INTRAVENOUS | Status: AC
  Administered 2018-08-18: 120 mg via INTRAVENOUS
  Filled 2018-08-18: qty 5

## 2018-08-18 NOTE — Progress Notes (Signed)
Last cycle mutation test pending, so MD gave 50% doses.  This cycle 3 only irinotecan stays at 50%, rest of doses increased to regular doses.

## 2018-08-18 NOTE — Progress Notes (Signed)
Patient here for follow up. Patient has had diarrhea on fri, sat and tues. Pt states she is feeling "fine" overall.

## 2018-08-18 NOTE — Progress Notes (Signed)
HR 107, ok to proceed per md

## 2018-08-18 NOTE — Progress Notes (Signed)
Hematology/Oncology follow-up note [facility] [phone] [phone]   Patient Care Team: Jodelle Green, FNP as PCP - General (Family Medicine) Clent Jacks, RN as Registered Nurse  REFERRING PROVIDER: Evie Lacks CHIEF COMPLAINTS/REASON FOR VISIT:  Follow-up for management of pancreatic cancer.  HISTORY OF PRESENTING ILLNESS:  Lynn Taylor is a  73 y.o.  female with PMH listed below who was referred to me for evaluation of evaluation of elevated ferritin. Patient recently had lab work-up done on 04/09/2018 which showed ferritin level 1877, iron 97, TIBC 340, iron saturation 29, 04/11/2018 CBC showed hemoglobin 13.2, MCV 81, WBC 7.6, platelet counts 246,000. Patient was referred to hematology for further evaluation of elevated ferritin.  #04/28/2018 Ultrasound of liver was obtained which showed CBD and intrahepatic biliary dilatation Patient wa she is s advised to go to emergency room for further evaluation. Also had hyperbilirubinemia.  Patient was complaining about being jaundiced, pruritus all over. MR abdomen MRCP with and without contrast showed market biliary duct dilatation, secondary to obstruction in the region of pancreatic head.  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 No abdominal adenopathy, liver metastasis or cross vascular involvement.  patient was evaluated by gastroenterology and status post ERCP with stenting. CA 19.9 1173 CEA 3.9 # 05/25/2018  patient was [facility] to [facility], [facility] and s/p whipple resection.Her postop course was c/b Type A pancreatic leak and c.diff. Pathology showed A. Hepatic artery lymph node, excision: One lymph node, negative for malignancy (0/1). B. Biliary stent, removal: Medical device, consistent with stent, gross examination only. C. 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 All margins are negative (closest margin=uncinate, 0.5 mm) Metastatic adenocarcinoma in one of thirty-two lymph nodes (1/32).  Portion of stomach and duodenum with no specific pathologic diagnosis.   Grade, 3 poorly differentiated.  Pathologic pT2 pN1  Cancer Treatment 07/13/2018 Cycle 1 FOLFIRINOX with Onpro Neulasta 08/03/2018 Cycle 1 mFOLFIRINOX with Onpro Neulasta - [50% dose for 5-FU and Irinotecan]   INTERVAL HISTORY Lynn Taylor is a 73 y.o. female who has above history reviewed by me today presents for follow up visit for evaluation prior to  cycle 3 dose reduced modified FOLFIRINOX.  Reports feeling well.  She takes Slow Mag one tablets daily.  Diarrhea, has 2 episode of diarrhea yesterday. She take imodium as instructed.  No abdominal pain.  Appetite is fair. Gained one pound since last week.   Review of Systems  Constitutional: Positive for fatigue. Negative for appetite change, chills and fever.  HENT:   Negative for hearing loss and voice change.   Eyes: Negative for eye problems.  Respiratory: Negative for chest tightness and cough.   Cardiovascular: Negative for chest pain.  Gastrointestinal: Positive for diarrhea. Negative for abdominal distention, abdominal pain, blood in stool and nausea.  Endocrine: Negative for hot flashes.  Genitourinary: Negative for difficulty urinating, frequency and hematuria.   Musculoskeletal: Negative for arthralgias.  Skin: Negative for itching and rash.  Neurological: Negative for extremity weakness.  Hematological: Negative for adenopathy. Does not bruise/bleed easily.  Psychiatric/Behavioral: Negative for confusion.     MEDICAL HISTORY:  Past Medical History:  Diagnosis Date  . Cancer ([place])   . GERD (gastroesophageal reflux disease)   . Hypertension   . Hypothyroidism   . Malignant neoplasm of head of pancreas ([place]) 06/08/2018    SURGICAL HISTORY:  Past Surgical History:  Procedure Laterality  Date  . CHOLECYSTECTOMY    . COLONOSCOPY N/A 12/02/2016   Procedure: COLONOSCOPY;  Surgeon: Lollie Sails, MD;  Location: [facility] ENDOSCOPY;  Service: Endoscopy;  Laterality: N/A;  . ERCP N/A 04/30/2018   Procedure: ENDOSCOPIC RETROGRADE CHOLANGIOPANCREATOGRAPHY (ERCP);  Surgeon: Lucilla Lame, MD;  Location: [facility] ENDOSCOPY;  Service: Endoscopy;  Laterality: N/A;  . ERCP N/A 05/05/2018   Procedure: ENDOSCOPIC RETROGRADE CHOLANGIOPANCREATOGRAPHY (ERCP);  Surgeon: Lucilla Lame, MD;  Location: [facility] ENDOSCOPY;  Service: Endoscopy;  Laterality: N/A;  . PORTA CATH INSERTION N/A 06/28/2018   Procedure: PORTA CATH INSERTION;  Surgeon: Algernon Huxley, MD;  Location: [facility];  Service: Cardiovascular;  Laterality: N/A;    SOCIAL HISTORY: Social History   Socioeconomic History  . Marital status: Married    Spouse name: Not on file  . Number of children: Not on file  . Years of education: Not on file  . Highest education level: Not on file  Occupational History  . Not on file  Social Needs  . Financial resource strain: Not on file  . Food insecurity:    Worry: Not on file    Inability: Not on file  . Transportation needs:    Medical: Not on file    Non-medical: Not on file  Tobacco Use  . Smoking status: Former Smoker    Last attempt to quit: 04/14/1988    Years since quitting: 30.3  . Smokeless tobacco: Never Used  Substance and Sexual Activity  . Alcohol use: Yes    Comment: social drinker  . Drug use: No  . Sexual activity: Not Currently  Lifestyle  . Physical activity:    Days per week: Not on file    Minutes per session: Not on file  . Stress: Not on file  Relationships  . Social connections:    Talks on phone: Not on file    Gets together: Not on file    Attends religious service: Not on file    Active member of club or organization: Not on file    Attends meetings of clubs or organizations: Not on file     Relationship status: Not on file  . Intimate partner violence:    Fear of current or ex partner: Not on file    Emotionally abused: Not on file    Physically abused: Not on file    Forced sexual activity: Not on file  Other Topics Concern  . Not on file  Social History Narrative  . Not on file    FAMILY HISTORY: Family History  Problem Relation Age of Onset  . Diabetes Mellitus I Other   . Alcoholism Other   . Hypertension Other   . Hyperlipidemia Other   . Coronary artery disease Other   . Stroke Other   . Osteoarthritis Other   . Migraines Other   . Heart Problems Mother   . Stroke Sister   . CAD Brother   . Stroke Brother   . Breast cancer Neg Hx     ALLERGIES:  has No Known Allergies.  MEDICATIONS:  Current Outpatient Medications  Medication Sig Dispense Refill  . chlorhexidine (PERIDEX) 0.12 % solution Use as directed 15 mLs in the mouth or throat 2 (two) times daily. 473 mL 3  . Cholecalciferol (VITAMIN D-3) 25 MCG (1000 UT) CAPS Take by mouth.    . dexamethasone (DECADRON) 4 MG tablet Take 2 tablets (8 mg total) by mouth daily. Start the day after chemo for 2 days. 8  tablet 5  . levothyroxine (SYNTHROID, LEVOTHROID) 100 MCG tablet Take 1 tablet (100 mcg total) by mouth daily before breakfast. 90 tablet 4  . loperamide (IMODIUM) 2 MG capsule Take 1 capsule (2 mg total) by mouth See admin instructions. With onset of loose stool, take 4mg  followed by 2mg  every 2 hours until 12 hours have passed without loose bowel movement. Maximum: 16 mg/day 120 capsule 1  . Magnesium Cl-Calcium Carbonate (SLOW-MAG PO) Take 2 tablets by mouth daily.    Marland Kitchen omeprazole (PRILOSEC) 20 MG capsule Take 1 capsule (20 mg total) by mouth daily. 30 capsule 2  . ondansetron (ZOFRAN) 4 MG tablet Take 1 tablet (4 mg total) by mouth every 6 (six) hours as needed for nausea or vomiting. 30 tablet 3  . potassium chloride SA (K-DUR,KLOR-CON) 20 MEQ tablet Take 2 tablets (40 mEq total) by mouth daily. 28  tablet 0  . prochlorperazine (COMPAZINE) 10 MG tablet Take 1 tablet (10 mg total) by mouth every 6 (six) hours as needed (NAUSEA). 30 tablet 1  . traMADol (ULTRAM) 50 MG tablet Take 1 tablet (50 mg total) by mouth every 6 (six) hours as needed. 30 tablet 0   No current facility-administered medications for this visit.    Facility-Administered Medications Ordered in Other Visits  Medication Dose Route Frequency Provider Last Rate Last Dose  . 0.9 %  sodium chloride infusion   Intravenous Continuous Earlie Server, MD   Stopped at 08/18/18 1033  . fluorouracil (ADRUCIL) 4,500 mg in sodium chloride 0.9 % 60 mL chemo infusion  2,400 mg/m2 (Treatment Plan Recorded) Intravenous 1 day or 1 dose Earlie Server, MD   4,500 mg at 08/18/18 1447     PHYSICAL EXAMINATION: ECOG PERFORMANCE STATUS: 0 - Asymptomatic Vitals:   08/18/18 0840  BP: 126/78  Pulse: (!) 107  Resp: 18  Temp: (!) 96.5 F (35.8 C)   Filed Weights   08/18/18 0840  Weight: 154 lb (69.9 kg)    Physical Exam Constitutional:      General: She is not in acute distress. HENT:     Head: Normocephalic and atraumatic.     Mouth/Throat:     Comments: No thrush Eyes:     General: No scleral icterus.    Pupils: Pupils are equal, round, and reactive to light.  Neck:     Musculoskeletal: Normal range of motion and neck supple.  Cardiovascular:     Rate and Rhythm: Normal rate and regular rhythm.     Heart sounds: Normal heart sounds.  Pulmonary:     Effort: Pulmonary effort is normal. No respiratory distress.     Breath sounds: No wheezing.  Abdominal:     General: Bowel sounds are normal. There is no distension.     Palpations: Abdomen is soft. There is no mass.     Tenderness: There is no abdominal tenderness.  Musculoskeletal: Normal range of motion.        General: No deformity.  Skin:    General: Skin is warm and dry.     Findings: No erythema or rash.  Neurological:     Mental Status: She is alert and oriented to person,  place, and time.     Cranial Nerves: No cranial nerve deficit.     Coordination: Coordination normal.  Psychiatric:        Behavior: Behavior normal.        Thought Content: Thought content normal.      LABORATORY DATA:  I have reviewed  the data as listed Lab Results  Component Value Date   WBC 19.3 (H) 08/18/2018   HGB 10.4 (L) 08/18/2018   HCT 32.3 (L) 08/18/2018   MCV 82.6 08/18/2018   PLT 249 08/18/2018   Recent Labs    04/21/18 1046  04/29/18 0612  05/10/18 1429  08/03/18 0825 08/10/18 0842 08/18/18 0811  NA 137   < > 136   < >  --    < > 133* 131* 132*  K 3.8   < > 3.3*   < >  --    < > 3.8 4.2 3.9  CL 100   < > 108   < >  --    < > 100 99 105  CO2 22   < > 23   < >  --    < > 22 23 19*  GLUCOSE 95   < > 101*   < >  --    < > 138* 122* 127*  BUN 23   < > 9   < >  --    < > 12 11 14   CREATININE 1.11*   < > 0.49   < >  --    < > 1.05* 0.85 0.92  CALCIUM 10.6*   < > 9.2   < >  --    < > 9.1 9.6 9.4  GFRNONAA 50*   < > >60   < >  --    < > 53* >60 >60  GFRAA 57*   < > >60   < >  --    < > >60 >60 >60  PROT 7.7   < >  --    < > 7.3   < > 6.8 7.5 7.3  ALBUMIN 3.9   < >  --    < > 3.8   < > 3.5 3.7 3.7  AST 61*   < >  --    < > 94*   < > 27 25 28   ALT 121*   < >  --    < > 80*   < > 12 11 14   ALKPHOS 275*   < >  --    < > 200*   < > 76 137* 125  BILITOT 11.7*   < > 13.7*   < > 6.2*   < > 0.5 0.5 0.5  BILIDIR 8.84*  --  8.2*  --  4.58*  --   --   --   --    < > = values in this interval not displayed.   Iron/TIBC/Ferritin/ %Sat    Component Value Date/Time   IRON 77 05/04/2018 0831   IRON 97 04/09/2018 1440   TIBC 320 05/04/2018 0831   TIBC 340 04/09/2018 1440   FERRITIN 496 (H) 05/04/2018 0831   FERRITIN 1,877 (H) 04/09/2018 1440   IRONPCTSAT 24 05/04/2018 0831   IRONPCTSAT 29 04/09/2018 1440     post op CA 19.9 is 21.    ASSESSMENT & PLAN:  1. Malignant neoplasm of head of pancreas ([place])   2. Chemotherapy induced diarrhea   3. Hypomagnesemia   4.  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  Status post 2 cycle of FOLFIRINOX She has had GI toxicities and hematological toxicities after cycle 1 FOLFIRINOX. Received cycle 2 modified FOLFIRINOX with 50% dose reduce of 5-FU and Irinotecan while waiting for results from UGT1A1*28 allele  status [UGT1A1 Irinotecan Toxicity] - DPD 5-Fluorouracil Toxicity   Labs are reviewed and discussed with patient.  Proceed with Cycle 3 FOLFINRINOX with 50% dose of Irinotecan.  Will see how she does on this dosage regimen.  G-CSF support with onpro on Day 3.   #Chemotherapy-induced diarrhea continue to utilize Imodium as instructed.  Also encourage oral hydration and Gatorade. # Hypomagnesia, will proceed with 2 g Mag sulfate today.  # Mild Hyponatremia, proceed with 1 L of IV normal saline and repeat another hydration session on Day 3.    #Anemia, secondary to chemotherapy.  Stable., continue to monitor.    Return to clinic with lab,+/- IV fluid and Magnesium  in 1 week. Labs MD assessment, +/- IV fluid and Magnesium, next cycle of treatment in 2 weeks.   Earlie Server, MD, PhD Hematology Oncology [facility] Pager- [phone] 08/18/2018

## 2018-08-20 ENCOUNTER — Other Ambulatory Visit: Payer: Self-pay

## 2018-08-20 ENCOUNTER — Inpatient Hospital Stay: Payer: Medicare Other

## 2018-08-20 DIAGNOSIS — C250 Malignant neoplasm of head of pancreas: Secondary | ICD-10-CM

## 2018-08-20 DIAGNOSIS — E871 Hypo-osmolality and hyponatremia: Secondary | ICD-10-CM

## 2018-08-20 MED ORDER — PEGFILGRASTIM 6 MG/0.6ML ~~LOC~~ PSKT
6.0000 mg | PREFILLED_SYRINGE | Freq: Once | SUBCUTANEOUS | Status: AC
  Administered 2018-08-20: 6 mg via SUBCUTANEOUS
  Filled 2018-08-20: qty 0.6

## 2018-08-20 MED ORDER — SODIUM CHLORIDE 0.9% FLUSH
10.0000 mL | Freq: Once | INTRAVENOUS | Status: AC | PRN
  Administered 2018-08-20: 10 mL
  Filled 2018-08-20: qty 10

## 2018-08-20 MED ORDER — SODIUM CHLORIDE 0.9 % IV SOLN
Freq: Once | INTRAVENOUS | Status: AC
  Administered 2018-08-20: 14:00:00 via INTRAVENOUS
  Filled 2018-08-20: qty 250

## 2018-08-20 MED ORDER — HEPARIN SOD (PORK) LOCK FLUSH 100 UNIT/ML IV SOLN
500.0000 [IU] | Freq: Once | INTRAVENOUS | Status: AC | PRN
  Administered 2018-08-20: 500 [IU]
  Filled 2018-08-20: qty 5

## 2018-08-23 ENCOUNTER — Other Ambulatory Visit: Payer: Self-pay | Admitting: Licensed Clinical Social Worker

## 2018-08-23 DIAGNOSIS — C250 Malignant neoplasm of head of pancreas: Secondary | ICD-10-CM

## 2018-08-25 ENCOUNTER — Other Ambulatory Visit: Payer: Self-pay

## 2018-08-25 ENCOUNTER — Inpatient Hospital Stay: Payer: Medicare Other

## 2018-08-25 DIAGNOSIS — C250 Malignant neoplasm of head of pancreas: Secondary | ICD-10-CM

## 2018-08-25 DIAGNOSIS — E871 Hypo-osmolality and hyponatremia: Secondary | ICD-10-CM

## 2018-08-25 DIAGNOSIS — Z95828 Presence of other vascular implants and grafts: Secondary | ICD-10-CM

## 2018-08-25 LAB — CBC WITH DIFFERENTIAL/PLATELET
Abs Immature Granulocytes: 0.1 10*3/uL — ABNORMAL HIGH (ref 0.00–0.07)
Band Neutrophils: 2 %
Basophils Absolute: 0 10*3/uL (ref 0.0–0.1)
Basophils Relative: 0 %
Eosinophils Absolute: 0.1 10*3/uL (ref 0.0–0.5)
Eosinophils Relative: 1 %
HCT: 30.4 % — ABNORMAL LOW (ref 36.0–46.0)
Hemoglobin: 9.8 g/dL — ABNORMAL LOW (ref 12.0–15.0)
Lymphocytes Relative: 41 %
Lymphs Abs: 2.6 10*3/uL (ref 0.7–4.0)
MCH: 26.4 pg (ref 26.0–34.0)
MCHC: 32.2 g/dL (ref 30.0–36.0)
MCV: 81.9 fL (ref 80.0–100.0)
Metamyelocytes Relative: 2 %
Monocytes Absolute: 0.1 10*3/uL (ref 0.1–1.0)
Monocytes Relative: 2 %
Neutro Abs: 3.5 10*3/uL (ref 1.7–7.7)
Neutrophils Relative %: 52 %
Platelets: 66 10*3/uL — ABNORMAL LOW (ref 150–400)
RBC: 3.71 MIL/uL — ABNORMAL LOW (ref 3.87–5.11)
RDW: 17.3 % — ABNORMAL HIGH (ref 11.5–15.5)
Smear Review: DECREASED
WBC: 6.4 10*3/uL (ref 4.0–10.5)
nRBC: 0 % (ref 0.0–0.2)

## 2018-08-25 LAB — COMPREHENSIVE METABOLIC PANEL
ALT: 11 U/L (ref 0–44)
AST: 22 U/L (ref 15–41)
Albumin: 3.7 g/dL (ref 3.5–5.0)
Alkaline Phosphatase: 122 U/L (ref 38–126)
Anion gap: 7 (ref 5–15)
BUN: 12 mg/dL (ref 8–23)
CO2: 21 mmol/L — ABNORMAL LOW (ref 22–32)
Calcium: 8.8 mg/dL — ABNORMAL LOW (ref 8.9–10.3)
Chloride: 101 mmol/L (ref 98–111)
Creatinine, Ser: 0.56 mg/dL (ref 0.44–1.00)
GFR calc Af Amer: >60 mL/min (ref >60)
GFR calc non Af Amer: >60 mL/min (ref >60)
Glucose, Bld: 135 mg/dL — ABNORMAL HIGH (ref 70–99)
Potassium: 4.1 mmol/L (ref 3.5–5.1)
Sodium: 129 mmol/L — ABNORMAL LOW (ref 135–145)
Total Bilirubin: 1.1 mg/dL (ref 0.3–1.2)
Total Protein: 7.3 g/dL (ref 6.5–8.1)

## 2018-08-25 LAB — MAGNESIUM: Magnesium: 2.1 mg/dL (ref 1.7–2.4)

## 2018-08-25 MED ORDER — SODIUM CHLORIDE 0.9% FLUSH
10.0000 mL | Freq: Once | INTRAVENOUS | Status: AC
  Administered 2018-08-25: 10 mL via INTRAVENOUS
  Filled 2018-08-25: qty 10

## 2018-08-25 MED ORDER — HEPARIN SOD (PORK) LOCK FLUSH 100 UNIT/ML IV SOLN
500.0000 [IU] | Freq: Once | INTRAVENOUS | Status: AC | PRN
  Administered 2018-08-25: 500 [IU]

## 2018-08-25 MED ORDER — SODIUM CHLORIDE 0.9 % IV SOLN
Freq: Once | INTRAVENOUS | Status: AC
  Administered 2018-08-25: 14:00:00 via INTRAVENOUS
  Filled 2018-08-25: qty 250

## 2018-08-26 ENCOUNTER — Other Ambulatory Visit: Payer: Self-pay | Admitting: *Deleted

## 2018-08-26 ENCOUNTER — Other Ambulatory Visit: Payer: Self-pay

## 2018-08-26 ENCOUNTER — Inpatient Hospital Stay: Payer: Medicare Other

## 2018-08-26 DIAGNOSIS — C250 Malignant neoplasm of head of pancreas: Secondary | ICD-10-CM

## 2018-08-26 DIAGNOSIS — E871 Hypo-osmolality and hyponatremia: Secondary | ICD-10-CM

## 2018-08-26 MED ORDER — HEPARIN SOD (PORK) LOCK FLUSH 100 UNIT/ML IV SOLN
500.0000 [IU] | Freq: Once | INTRAVENOUS | Status: AC | PRN
  Administered 2018-08-26: 500 [IU]

## 2018-08-26 MED ORDER — SODIUM CHLORIDE 0.9% FLUSH
10.0000 mL | Freq: Once | INTRAVENOUS | Status: AC | PRN
  Administered 2018-08-26: 10 mL
  Filled 2018-08-26: qty 10

## 2018-08-26 MED ORDER — DEXAMETHASONE 4 MG PO TABS: 8.0000 mg | ORAL_TABLET | Freq: Every day | ORAL | 5 refills | Status: DC

## 2018-08-26 MED ORDER — SODIUM CHLORIDE 0.9 % IV SOLN
Freq: Once | INTRAVENOUS | Status: AC
  Administered 2018-08-26: 14:00:00 via INTRAVENOUS
  Filled 2018-08-26: qty 250

## 2018-08-27 ENCOUNTER — Other Ambulatory Visit: Payer: Self-pay

## 2018-08-27 ENCOUNTER — Inpatient Hospital Stay: Payer: Medicare Other | Attending: Oncology

## 2018-08-27 DIAGNOSIS — E871 Hypo-osmolality and hyponatremia: Secondary | ICD-10-CM

## 2018-08-27 DIAGNOSIS — C250 Malignant neoplasm of head of pancreas: Secondary | ICD-10-CM | POA: Insufficient documentation

## 2018-08-27 DIAGNOSIS — E876 Hypokalemia: Secondary | ICD-10-CM | POA: Diagnosis not present

## 2018-08-27 DIAGNOSIS — Z95828 Presence of other vascular implants and grafts: Secondary | ICD-10-CM

## 2018-08-27 MED ORDER — HEPARIN SOD (PORK) LOCK FLUSH 100 UNIT/ML IV SOLN
500.0000 [IU] | Freq: Once | INTRAVENOUS | Status: AC
  Administered 2018-08-27: 500 [IU] via INTRAVENOUS

## 2018-08-27 MED ORDER — SODIUM CHLORIDE 0.9% FLUSH
10.0000 mL | INTRAVENOUS | Status: DC | PRN
  Administered 2018-08-27: 10 mL via INTRAVENOUS
  Filled 2018-08-27: qty 10

## 2018-08-27 MED ORDER — SODIUM CHLORIDE 0.9 % IV SOLN
Freq: Once | INTRAVENOUS | Status: AC
  Administered 2018-08-27: 13:00:00 via INTRAVENOUS
  Filled 2018-08-27: qty 250

## 2018-08-31 ENCOUNTER — Telehealth: Payer: Self-pay | Admitting: Licensed Clinical Social Worker

## 2018-08-31 ENCOUNTER — Other Ambulatory Visit: Payer: Self-pay

## 2018-08-31 ENCOUNTER — Encounter: Payer: Self-pay | Admitting: Licensed Clinical Social Worker

## 2018-08-31 ENCOUNTER — Ambulatory Visit: Payer: Self-pay | Admitting: Licensed Clinical Social Worker

## 2018-08-31 DIAGNOSIS — Z1379 Encounter for other screening for genetic and chromosomal anomalies: Secondary | ICD-10-CM | POA: Insufficient documentation

## 2018-08-31 HISTORY — DX: Encounter for other screening for genetic and chromosomal anomalies: Z13.79

## 2018-08-31 NOTE — Telephone Encounter (Signed)
Revealed negative genetic testing. This normal result is reassuring and indicates that it is unlikely Lynn Taylor's cancer is due to a hereditary cause.  It is unlikely that there is an increased risk of another cancer due to a mutation in one of these genes.  However, genetic testing is not perfect, and cannot definitively rule out a hereditary cause.  It will be important for her to keep in contact with genetics to learn if any additional testing may be needed in the future.

## 2018-08-31 NOTE — Progress Notes (Signed)
HPI:  Lynn Taylor was previously seen in the [place] clinic due to a personal history of cancer and concerns regarding a hereditary predisposition to cancer. Please refer to our prior cancer genetics clinic note for more information regarding our discussion, assessment and recommendations, at the time. Lynn Taylor recent genetic test results were disclosed to her, as were recommendations warranted by these results. These results and recommendations are discussed in more detail below.  In Dec 2020, at the age of 73, Ms. Lynn Taylor was diagnosed with pancreatic cancer.  She underwent whipple resection on 05/25/2018. She is currently undergoing adjuvant chemotherapy.    CANCER HISTORY:    Malignant neoplasm of head of pancreas ([place])   06/08/2018 Initial Diagnosis    Malignant neoplasm of head of pancreas ([place])    07/13/2018 -  Chemotherapy    The patient had palonosetron (ALOXI) injection 0.25 mg, 0.25 mg, Intravenous,  Once, 3 of 12 cycles Administration: 0.25 mg (07/13/2018), 0.25 mg (08/03/2018), 0.25 mg (08/18/2018) pegfilgrastim (NEULASTA ONPRO KIT) injection 6 mg, 6 mg, Subcutaneous, Once, 3 of 12 cycles Administration: 6 mg (07/15/2018), 6 mg (08/05/2018), 6 mg (08/20/2018) irinotecan (CAMPTOSAR) 340 mg in dextrose 5 % 500 mL chemo infusion, 180 mg/m2 = 340 mg, Intravenous,  Once, 3 of 12 cycles Dose modification: 65 mg/m2 (original dose 180 mg/m2, Cycle 2, Reason: Dose not tolerated), 65 mg/m2 (original dose 180 mg/m2, Cycle 3, Reason: Dose not tolerated) Administration: 340 mg (07/13/2018), 120 mg (08/03/2018), 120 mg (08/18/2018) leucovorin 750 mg in dextrose 5 % 250 mL infusion, 752 mg, Intravenous,  Once, 3 of 12 cycles Administration: 750 mg (07/13/2018), 750 mg (08/03/2018), 750 mg (08/18/2018) oxaliplatin (ELOXATIN) 150 mg in dextrose 5 % 500 mL chemo infusion, 160 mg, Intravenous,  Once, 3 of 12 cycles Dose modification: 75 mg/m2 (original dose 85 mg/m2, Cycle 2, Reason: Dose not  tolerated) Administration: 150 mg (07/13/2018), 135 mg (08/03/2018), 150 mg (08/18/2018) fluorouracil (ADRUCIL) chemo injection 750 mg, 400 mg/m2 = 750 mg, Intravenous,  Once, 3 of 12 cycles Dose modification: 200 mg/m2 (original dose 400 mg/m2, Cycle 2, Reason: Dose not tolerated) Administration: 750 mg (07/13/2018), 350 mg (08/03/2018), 750 mg (08/18/2018) fluorouracil (ADRUCIL) 4,500 mg in sodium chloride 0.9 % 60 mL chemo infusion, 2,400 mg/m2 = 4,500 mg, Intravenous, 1 Day/Dose, 3 of 12 cycles Dose modification: 1,200 mg/m2 (original dose 2,400 mg/m2, Cycle 2, Reason: Dose not tolerated) Administration: 4,500 mg (07/13/2018), 2,250 mg (08/03/2018), 4,500 mg (08/18/2018)  for chemotherapy treatment.      FAMILY HISTORY:  We obtained a detailed, 4-generation family history.  Significant diagnoses are listed below: Family History  Problem Relation Age of Onset  . Diabetes Mellitus I Other   . Alcoholism Other   . Hypertension Other   . Hyperlipidemia Other   . Coronary artery disease Other   . Stroke Other   . Osteoarthritis Other   . Migraines Other   . Heart Problems Mother   . Stroke Sister   . CAD Brother   . Stroke Brother   . Breast cancer Neg Hx    Lynn Taylor is adopted, so she has limited information about her family.  Lynn Taylor has 2 sons and 1 daughter (ages 54, 59, 65).  Lynn Taylor has 2 maternal half brothers and 2 maternal half-sisters with no hx of cancer.    Lynn Taylor father: died in [place] war Paternal Aunts/Uncles: 1 uncle and 3 sisters, no info Paternal cousins: no info Paternal grandfather: no info  Paternal grandmother:no info  Lynn Taylor's mother: deceased, no info Maternal Aunts/Uncles: patient has uncles, no known info Maternal cousins: unk Maternal grandfather: unk Maternal grandmother:unk  Lynn Taylor is unaware of previous family history of genetic testing for hereditary cancer risks. Patient's maternal ancestors are of African American descent, and  paternal ancestors are of African American descent. There is no reported Ashkenazi Jewish ancestry. There is no known consanguinity.  GENETIC TEST RESULTS: Genetic testing reported out on 08/31/2018 through the [facility] Multi-Cancer panel found no pathogenic mutations.   The Multi-Cancer Panel offered by [facility] includes sequencing and/or deletion duplication testing of the following 84 genes: AIP, ALK, APC, ATM, AXIN2,BAP1,  BARD1, BLM, BMPR1A, BRCA1, BRCA2, BRIP1, CASR, CDC73, CDH1, CDK4, CDKN1B, CDKN1C, CDKN2A (p14ARF), CDKN2A (p16INK4a), CEBPA, CHEK2, CTNNA1, DICER1, DIS3L2, EGFR (c.2369C>T, p.Thr790Met variant only), EPCAM (Deletion/duplication testing only), FH, FLCN, GATA2, GPC3, GREM1 (Promoter region deletion/duplication testing only), HOXB13 (c.251G>A, p.Gly84Glu), HRAS, KIT, MAX, MEN1, MET, MITF (c.952G>A, p.Glu318Lys variant only), MLH1, MSH2, MSH3, MSH6, MUTYH, NBN, NF1, NF2, NTHL1, PALB2, PDGFRA, PHOX2B, PMS2, POLD1, POLE, POT1, PRKAR1A, PTCH1, PTEN, RAD50, RAD51C, RAD51D, RB1, RECQL4, RET, RUNX1, SDHAF2, SDHA (sequence changes only), SDHB, SDHC, SDHD, SMAD4, SMARCA4, SMARCB1, SMARCE1, STK11, SUFU, TERC, TERT, TMEM127, TP53, TSC1, TSC2, VHL, WRN and WT1.   The test report has been scanned into EPIC and is located under the Molecular Pathology section of the Results Review tab.    We discussed with [name] that because current genetic testing is not perfect, it is possible there may be a gene mutation in one of these genes that current testing cannot detect, but that chance is small.  We also discussed, that there could be another gene that has not yet been discovered, or that we have not yet tested, that is responsible for the cancer diagnoses in the family. It is also possible there is a hereditary cause for the cancer in the family that Ms. Walts did not inherit and therefore was not identified in her testing.  Therefore, it is important to remain in touch with cancer genetics in the future  so that we can continue to offer [name] the most up to date genetic testing.   ADDITIONAL GENETIC TESTING: We discussed with Ms. Fomby that her genetic testing was fairly extensive.  If there are genes identified to increase cancer risk that can be analyzed in the future, we would be happy to discuss and coordinate this testing at that time.    CANCER SCREENING RECOMMENDATIONS: Ms. North test result is considered negative (normal).  This means that we have not identified a hereditary cause for her history of cancer at this time. Most cancers happen by chance and this negative test suggests that her cancer may fall into this category.    While reassuring, this does not definitively rule out a hereditary predisposition to cancer. It is still possible that there could be genetic mutations that are undetectable by current technology. There could be genetic mutations in genes that have not been tested or identified to increase cancer risk.  Therefore, it is recommended she continue to follow the cancer management and screening guidelines provided by her oncology and primary healthcare provider.   An individual's cancer risk and medical management are not determined by genetic test results alone. Overall cancer risk assessment incorporates additional factors, including personal medical history, family history, and any available genetic information that may result in a personalized plan for cancer prevention and surveillance  RECOMMENDATIONS FOR FAMILY MEMBERS:  Individuals  in this family might be at some increased risk of developing cancer, over the general population risk, simply due to the family history of cancer.  We recommended women in this family have a yearly mammogram beginning at age 74, or 63 years younger than the earliest onset of cancer, an annual clinical breast exam, and perform monthly breast self-exams. Women in this family should also have a gynecological exam as recommended by their  primary provider. All family members should have a colonoscopy by age 58.  FOLLOW-UP: Lastly, we discussed with Ms. Tercero that cancer genetics is a rapidly advancing field and it is possible that new genetic tests will be appropriate for her and/or her family members in the future. We encouraged her to remain in contact with cancer genetics on an annual basis so we can update her personal and family histories and let her know of advances in cancer genetics that may benefit this family.   Our contact number was provided. Ms. Ingman questions were answered to her satisfaction, and she knows she is welcome to call us at anytime with additional questions or concerns.   Faith Rogue, MS Genetic Counselor [email or web address] Phone: [phone]

## 2018-09-01 ENCOUNTER — Encounter: Payer: Self-pay | Admitting: Oncology

## 2018-09-01 ENCOUNTER — Inpatient Hospital Stay: Payer: Medicare Other

## 2018-09-01 ENCOUNTER — Other Ambulatory Visit: Payer: Self-pay

## 2018-09-01 ENCOUNTER — Inpatient Hospital Stay (HOSPITAL_BASED_OUTPATIENT_CLINIC_OR_DEPARTMENT_OTHER): Payer: Medicare Other | Admitting: Oncology

## 2018-09-01 VITALS — BP 135/77 | HR 114 | Temp 98.6°F | Resp 18 | Wt 150.5 lb

## 2018-09-01 DIAGNOSIS — Z87891 Personal history of nicotine dependence: Secondary | ICD-10-CM

## 2018-09-01 DIAGNOSIS — D6481 Anemia due to antineoplastic chemotherapy: Secondary | ICD-10-CM

## 2018-09-01 DIAGNOSIS — E039 Hypothyroidism, unspecified: Secondary | ICD-10-CM

## 2018-09-01 DIAGNOSIS — C250 Malignant neoplasm of head of pancreas: Secondary | ICD-10-CM

## 2018-09-01 DIAGNOSIS — E871 Hypo-osmolality and hyponatremia: Secondary | ICD-10-CM

## 2018-09-01 DIAGNOSIS — E876 Hypokalemia: Secondary | ICD-10-CM | POA: Diagnosis not present

## 2018-09-01 DIAGNOSIS — K521 Toxic gastroenteritis and colitis: Secondary | ICD-10-CM

## 2018-09-01 DIAGNOSIS — D696 Thrombocytopenia, unspecified: Secondary | ICD-10-CM

## 2018-09-01 DIAGNOSIS — I10 Essential (primary) hypertension: Secondary | ICD-10-CM

## 2018-09-01 DIAGNOSIS — Z79899 Other long term (current) drug therapy: Secondary | ICD-10-CM

## 2018-09-01 DIAGNOSIS — T451X5S Adverse effect of antineoplastic and immunosuppressive drugs, sequela: Secondary | ICD-10-CM

## 2018-09-01 DIAGNOSIS — K219 Gastro-esophageal reflux disease without esophagitis: Secondary | ICD-10-CM

## 2018-09-01 DIAGNOSIS — R5383 Other fatigue: Secondary | ICD-10-CM

## 2018-09-01 DIAGNOSIS — Z5111 Encounter for antineoplastic chemotherapy: Secondary | ICD-10-CM

## 2018-09-01 DIAGNOSIS — E8889 Other specified metabolic disorders: Secondary | ICD-10-CM

## 2018-09-01 DIAGNOSIS — T451X5A Adverse effect of antineoplastic and immunosuppressive drugs, initial encounter: Secondary | ICD-10-CM

## 2018-09-01 LAB — COMPREHENSIVE METABOLIC PANEL
ALT: 13 U/L (ref 0–44)
AST: 23 U/L (ref 15–41)
Albumin: 3.5 g/dL (ref 3.5–5.0)
Alkaline Phosphatase: 116 U/L (ref 38–126)
Anion gap: 9 (ref 5–15)
BUN: 10 mg/dL (ref 8–23)
CO2: 22 mmol/L (ref 22–32)
Calcium: 9.4 mg/dL (ref 8.9–10.3)
Chloride: 100 mmol/L (ref 98–111)
Creatinine, Ser: 0.76 mg/dL (ref 0.44–1.00)
GFR calc Af Amer: >60 mL/min (ref >60)
GFR calc non Af Amer: >60 mL/min (ref >60)
Glucose, Bld: 129 mg/dL — ABNORMAL HIGH (ref 70–99)
Potassium: 3.5 mmol/L (ref 3.5–5.1)
Sodium: 131 mmol/L — ABNORMAL LOW (ref 135–145)
Total Bilirubin: 0.6 mg/dL (ref 0.3–1.2)
Total Protein: 6.8 g/dL (ref 6.5–8.1)

## 2018-09-01 LAB — CBC WITH DIFFERENTIAL/PLATELET
Abs Immature Granulocytes: 0.45 10*3/uL — ABNORMAL HIGH (ref 0.00–0.07)
Basophils Absolute: 0.1 10*3/uL (ref 0.0–0.1)
Basophils Relative: 0 %
Eosinophils Absolute: 0 10*3/uL (ref 0.0–0.5)
Eosinophils Relative: 0 %
HCT: 27.8 % — ABNORMAL LOW (ref 36.0–46.0)
Hemoglobin: 9.1 g/dL — ABNORMAL LOW (ref 12.0–15.0)
Immature Granulocytes: 3 %
Lymphocytes Relative: 14 %
Lymphs Abs: 2.2 10*3/uL (ref 0.7–4.0)
MCH: 26.4 pg (ref 26.0–34.0)
MCHC: 32.7 g/dL (ref 30.0–36.0)
MCV: 80.6 fL (ref 80.0–100.0)
Monocytes Absolute: 1.2 10*3/uL — ABNORMAL HIGH (ref 0.1–1.0)
Monocytes Relative: 8 %
Neutro Abs: 11.5 10*3/uL — ABNORMAL HIGH (ref 1.7–7.7)
Neutrophils Relative %: 75 %
Platelets: 95 10*3/uL — ABNORMAL LOW (ref 150–400)
RBC: 3.45 MIL/uL — ABNORMAL LOW (ref 3.87–5.11)
RDW: 18.9 % — ABNORMAL HIGH (ref 11.5–15.5)
WBC: 15.4 10*3/uL — ABNORMAL HIGH (ref 4.0–10.5)
nRBC: 0 % (ref 0.0–0.2)

## 2018-09-01 LAB — MAGNESIUM: Magnesium: 1.6 mg/dL — ABNORMAL LOW (ref 1.7–2.4)

## 2018-09-01 MED ORDER — DEXTROSE 5 % IV SOLN
Freq: Once | INTRAVENOUS | Status: AC
  Administered 2018-09-01: 12:00:00 via INTRAVENOUS
  Filled 2018-09-01: qty 250

## 2018-09-01 MED ORDER — ATROPINE SULFATE 1 MG/ML IJ SOLN
0.5000 mg | Freq: Once | INTRAMUSCULAR | Status: AC | PRN
  Administered 2018-09-01: 0.5 mg via INTRAVENOUS
  Filled 2018-09-01: qty 1

## 2018-09-01 MED ORDER — POTASSIUM CHLORIDE CRYS ER 20 MEQ PO TBCR: 40.0000 meq | EXTENDED_RELEASE_TABLET | Freq: Every day | ORAL | 0 refills | Status: DC

## 2018-09-01 MED ORDER — SODIUM CHLORIDE 0.9 % IV SOLN
2400.0000 mg/m2 | INTRAVENOUS | Status: DC
  Administered 2018-09-01: 4150 mg via INTRAVENOUS
  Filled 2018-09-01: qty 83

## 2018-09-01 MED ORDER — SODIUM CHLORIDE 0.9 % IV SOLN: 2.0000 g | Freq: Once | INTRAVENOUS | Status: DC

## 2018-09-01 MED ORDER — FLUOROURACIL CHEMO INJECTION 2.5 GM/50ML
400.0000 mg/m2 | Freq: Once | INTRAVENOUS | Status: AC
  Administered 2018-09-01: 700 mg via INTRAVENOUS
  Filled 2018-09-01: qty 14

## 2018-09-01 MED ORDER — SODIUM CHLORIDE 0.9% FLUSH
10.0000 mL | INTRAVENOUS | Status: DC | PRN
  Administered 2018-09-01: 10 mL via INTRAVENOUS
  Filled 2018-09-01: qty 10

## 2018-09-01 MED ORDER — SODIUM CHLORIDE 0.9 % IV SOLN
Freq: Once | INTRAVENOUS | Status: AC
  Administered 2018-09-01: 1000 mL via INTRAVENOUS
  Filled 2018-09-01: qty 250

## 2018-09-01 MED ORDER — IRINOTECAN HCL CHEMO INJECTION 100 MG/5ML
65.0000 mg/m2 | Freq: Once | INTRAVENOUS | Status: AC
  Administered 2018-09-01: 120 mg via INTRAVENOUS
  Filled 2018-09-01: qty 5

## 2018-09-01 MED ORDER — MAGNESIUM SULFATE 2 GM/50ML IV SOLN
2.0000 g | Freq: Once | INTRAVENOUS | Status: AC
  Administered 2018-09-01: 2 g via INTRAVENOUS
  Filled 2018-09-01: qty 50

## 2018-09-01 MED ORDER — PALONOSETRON HCL INJECTION 0.25 MG/5ML
0.2500 mg | Freq: Once | INTRAVENOUS | Status: AC
  Administered 2018-09-01: 0.25 mg via INTRAVENOUS
  Filled 2018-09-01: qty 5

## 2018-09-01 MED ORDER — OXALIPLATIN CHEMO INJECTION 100 MG/20ML
86.0000 mg/m2 | Freq: Once | INTRAVENOUS | Status: AC
  Administered 2018-09-01: 150 mg via INTRAVENOUS
  Filled 2018-09-01: qty 30

## 2018-09-01 MED ORDER — LEUCOVORIN CALCIUM INJECTION 350 MG
750.0000 mg | Freq: Once | INTRAVENOUS | Status: AC
  Administered 2018-09-01: 750 mg via INTRAVENOUS
  Filled 2018-09-01: qty 10

## 2018-09-01 MED ORDER — DEXAMETHASONE SODIUM PHOSPHATE 10 MG/ML IJ SOLN
10.0000 mg | Freq: Once | INTRAMUSCULAR | Status: AC
  Administered 2018-09-01: 10 mg via INTRAVENOUS
  Filled 2018-09-01: qty 1

## 2018-09-01 MED ORDER — HEPARIN SOD (PORK) LOCK FLUSH 100 UNIT/ML IV SOLN: 500.0000 [IU] | Freq: Once | INTRAVENOUS | Status: DC

## 2018-09-01 NOTE — Progress Notes (Signed)
Hematology/Oncology follow-up note [facility] [phone] [phone]   Patient Care Team: Jodelle Green, FNP as PCP - General (Family Medicine) Clent Jacks, RN as Registered Nurse  REFERRING PROVIDER: Evie Lacks CHIEF COMPLAINTS/REASON FOR VISIT:  Follow-up for management of pancreatic cancer.  HISTORY OF PRESENTING ILLNESS:  Lynn Taylor is a  73 y.o.  female with PMH listed below who was referred to me for evaluation of evaluation of elevated ferritin. Patient recently had lab work-up done on 04/09/2018 which showed ferritin level 1877, iron 97, TIBC 340, iron saturation 29, 04/11/2018 CBC showed hemoglobin 13.2, MCV 81, WBC 7.6, platelet counts 246,000. Patient was referred to hematology for further evaluation of elevated ferritin.  #04/28/2018 Ultrasound of liver was obtained which showed CBD and intrahepatic biliary dilatation Patient wa she is s advised to go to emergency room for further evaluation. Also had hyperbilirubinemia.  Patient was complaining about being jaundiced, pruritus all over. MR abdomen MRCP with and without contrast showed market biliary duct dilatation, secondary to obstruction in the region of pancreatic head.  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 No abdominal adenopathy, liver metastasis or cross vascular involvement.  patient was evaluated by gastroenterology and status post ERCP with stenting. CA 19.9 1173 CEA 3.9 # 05/25/2018  patient was referred to [facility] and s/p whipple resection.Her postop course was c/b Type A pancreatic leak and c.diff. Pathology showed A. Hepatic artery lymph node, excision: One lymph node, negative for malignancy (0/1). B. Biliary stent, removal: Medical device, consistent with stent, gross examination only. C. 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 All margins are negative (closest margin=uncinate, 0.5 mm) Metastatic adenocarcinoma in one of thirty-two lymph nodes (1/32).  Portion of stomach and duodenum with no specific pathologic diagnosis.   Grade, 3 poorly differentiated.  Pathologic pT2 pN1  Cancer Treatment 07/13/2018 Cycle 1 FOLFIRINOX with Onpro Neulasta 08/03/2018 Cycle 1 mFOLFIRINOX with Onpro Neulasta - [50% dose for 5-FU and Irinotecan]   INTERVAL HISTORY Lynn Taylor is a 73 y.o. female who has above history reviewed by me today presents for follow up visit for evaluation prior to cycle 4 dose reduced modified FOLFIRINOX. Patient reports feeling well. She takes Slow-Mag 2 tablets daily. Diarrhea, grade 1, intermittent.  She takes Imodium as instructed. Denies any abdominal pain, fever, chills, nausea, or vomiting.  Appetite is fair.  She lost 4 pounds since last visit.  Review of Systems  Constitutional: Positive for fatigue. Negative for appetite change, chills and fever.  HENT:   Negative for hearing loss and voice change.   Eyes: Negative for eye problems.  Respiratory: Negative for chest tightness and cough.   Cardiovascular: Negative for chest pain.  Gastrointestinal: Positive for diarrhea. Negative for abdominal distention, abdominal pain, blood in stool and nausea.  Endocrine: Negative for hot flashes.  Genitourinary: Negative for difficulty urinating, frequency and hematuria.   Musculoskeletal: Negative for arthralgias.  Skin: Negative for itching and rash.  Neurological: Negative for extremity weakness.  Hematological: Negative for adenopathy. Does not bruise/bleed easily.  Psychiatric/Behavioral: Negative for confusion.     MEDICAL HISTORY:  Past Medical History:  Diagnosis Date  . Cancer ([place])   . GERD (gastroesophageal reflux disease)   . Hypertension   . Hypothyroidism   . Malignant neoplasm of head of pancreas  ([place]) 06/08/2018    SURGICAL HISTORY: Past Surgical History:  Procedure Laterality  Date  . CHOLECYSTECTOMY    . COLONOSCOPY N/A 12/02/2016   Procedure: COLONOSCOPY;  Surgeon: Lollie Sails, MD;  Location: [facility] ENDOSCOPY;  Service: Endoscopy;  Laterality: N/A;  . ERCP N/A 04/30/2018   Procedure: ENDOSCOPIC RETROGRADE CHOLANGIOPANCREATOGRAPHY (ERCP);  Surgeon: Lucilla Lame, MD;  Location: [facility] ENDOSCOPY;  Service: Endoscopy;  Laterality: N/A;  . ERCP N/A 05/05/2018   Procedure: ENDOSCOPIC RETROGRADE CHOLANGIOPANCREATOGRAPHY (ERCP);  Surgeon: Lucilla Lame, MD;  Location: [facility] ENDOSCOPY;  Service: Endoscopy;  Laterality: N/A;  . PORTA CATH INSERTION N/A 06/28/2018   Procedure: PORTA CATH INSERTION;  Surgeon: Algernon Huxley, MD;  Location: [facility];  Service: Cardiovascular;  Laterality: N/A;    SOCIAL HISTORY: Social History   Socioeconomic History  . Marital status: Married    Spouse name: Not on file  . Number of children: Not on file  . Years of education: Not on file  . Highest education level: Not on file  Occupational History  . Not on file  Social Needs  . Financial resource strain: Not on file  . Food insecurity:    Worry: Not on file    Inability: Not on file  . Transportation needs:    Medical: Not on file    Non-medical: Not on file  Tobacco Use  . Smoking status: Former Smoker    Last attempt to quit: 04/14/1988    Years since quitting: 30.4  . Smokeless tobacco: Never Used  Substance and Sexual Activity  . Alcohol use: Yes    Comment: social drinker  . Drug use: No  . Sexual activity: Not Currently  Lifestyle  . Physical activity:    Days per week: Not on file    Minutes per session: Not on file  . Stress: Not on file  Relationships  . Social connections:    Talks on phone: Not on file    Gets together: Not on file    Attends religious service: Not on file    Active member of club or organization: Not on file    Attends meetings of clubs  or organizations: Not on file    Relationship status: Not on file  . Intimate partner violence:    Fear of current or ex partner: Not on file    Emotionally abused: Not on file    Physically abused: Not on file    Forced sexual activity: Not on file  Other Topics Concern  . Not on file  Social History Narrative  . Not on file    FAMILY HISTORY: Family History  Problem Relation Age of Onset  . Diabetes Mellitus I Other   . Alcoholism Other   . Hypertension Other   . Hyperlipidemia Other   . Coronary artery disease Other   . Stroke Other   . Osteoarthritis Other   . Migraines Other   . Heart Problems Mother   . Stroke Sister   . CAD Brother   . Stroke Brother   . Breast cancer Neg Hx     ALLERGIES:  has No Known Allergies.  MEDICATIONS:  Current Outpatient Medications  Medication Sig Dispense Refill  . chlorhexidine (PERIDEX) 0.12 % solution Use as directed 15 mLs in the mouth or throat 2 (two) times daily. 473 mL 3  . Cholecalciferol (VITAMIN D-3) 25 MCG (1000 UT) CAPS Take by mouth.    . dexamethasone (DECADRON) 4 MG tablet Take 2 tablets (8 mg total) by mouth daily. Start the day after chemo for 2 days. 8  tablet 5  . levothyroxine (SYNTHROID, LEVOTHROID) 100 MCG tablet Take 1 tablet (100 mcg total) by mouth daily before breakfast. 90 tablet 4  . loperamide (IMODIUM) 2 MG capsule Take 1 capsule (2 mg total) by mouth See admin instructions. With onset of loose stool, take 4mg  followed by 2mg  every 2 hours until 12 hours have passed without loose bowel movement. Maximum: 16 mg/day 120 capsule 1  . Magnesium Cl-Calcium Carbonate (SLOW-MAG PO) Take 2 tablets by mouth daily.    Marland Kitchen omeprazole (PRILOSEC) 20 MG capsule Take 1 capsule (20 mg total) by mouth daily. 30 capsule 2  . ondansetron (ZOFRAN) 4 MG tablet Take 1 tablet (4 mg total) by mouth every 6 (six) hours as needed for nausea or vomiting. 30 tablet 3  . potassium chloride SA (K-DUR,KLOR-CON) 20 MEQ tablet Take 2 tablets  (40 mEq total) by mouth daily. 28 tablet 0  . prochlorperazine (COMPAZINE) 10 MG tablet Take 1 tablet (10 mg total) by mouth every 6 (six) hours as needed (NAUSEA). 30 tablet 1  . traMADol (ULTRAM) 50 MG tablet Take 1 tablet (50 mg total) by mouth every 6 (six) hours as needed. 30 tablet 0   No current facility-administered medications for this visit.    Facility-Administered Medications Ordered in Other Visits  Medication Dose Route Frequency Provider Last Rate Last Dose  . fluorouracil (ADRUCIL) 4,150 mg in sodium chloride 0.9 % 67 mL chemo infusion  2,400 mg/m2 (Order-Specific) Intravenous 1 day or 1 dose Earlie Server, MD      . fluorouracil (ADRUCIL) chemo injection 700 mg  400 mg/m2 (Order-Specific) Intravenous Once Earlie Server, MD      . heparin lock flush 100 unit/mL  500 Units Intravenous Once Earlie Server, MD      . irinotecan (CAMPTOSAR) 120 mg in dextrose 5 % 500 mL chemo infusion  65 mg/m2 (Order-Specific) Intravenous Once Earlie Server, MD      . leucovorin 750 mg in dextrose 5 % 250 mL infusion  750 mg Intravenous Once Earlie Server, MD      . oxaliplatin (ELOXATIN) 150 mg in dextrose 5 % 500 mL chemo infusion  86 mg/m2 (Order-Specific) Intravenous Once Earlie Server, MD 265 mL/hr at 09/01/18 1203 150 mg at 09/01/18 1203  . sodium chloride flush (NS) 0.9 % injection 10 mL  10 mL Intravenous PRN Earlie Server, MD   10 mL at 09/01/18 0823     PHYSICAL EXAMINATION: ECOG PERFORMANCE STATUS: 0 - Asymptomatic Vitals:   09/01/18 0843  BP: 135/77  Pulse: (!) 114  Resp: 18  Temp: 98.6 F (37 C)   Filed Weights   09/01/18 0843  Weight: 150 lb 8 oz (68.3 kg)    Physical Exam Constitutional:      General: She is not in acute distress. HENT:     Head: Normocephalic and atraumatic.     Mouth/Throat:     Comments: No thrush Eyes:     General: No scleral icterus.    Pupils: Pupils are equal, round, and reactive to light.  Neck:     Musculoskeletal: Normal range of motion and neck supple.  Cardiovascular:      Rate and Rhythm: Normal rate and regular rhythm.     Heart sounds: Normal heart sounds.  Pulmonary:     Effort: Pulmonary effort is normal. No respiratory distress.     Breath sounds: No wheezing.  Abdominal:     General: Bowel sounds are normal. There is no distension.  Palpations: Abdomen is soft. There is no mass.     Tenderness: There is no abdominal tenderness.  Musculoskeletal: Normal range of motion.        General: No deformity.  Skin:    General: Skin is warm and dry.     Findings: No erythema or rash.  Neurological:     Mental Status: She is alert and oriented to person, place, and time.     Cranial Nerves: No cranial nerve deficit.     Coordination: Coordination normal.  Psychiatric:        Behavior: Behavior normal.        Thought Content: Thought content normal.      LABORATORY DATA:  I have reviewed the data as listed Lab Results  Component Value Date   WBC 15.4 (H) 09/01/2018   HGB 9.1 (L) 09/01/2018   HCT 27.8 (L) 09/01/2018   MCV 80.6 09/01/2018   PLT 95 (L) 09/01/2018   Recent Labs    04/21/18 1046  04/29/18 0612  05/10/18 1429  08/18/18 0811 08/25/18 1255 09/01/18 0823  NA 137   < > 136   < >  --    < > 132* 129* 131*  K 3.8   < > 3.3*   < >  --    < > 3.9 4.1 3.5  CL 100   < > 108   < >  --    < > 105 101 100  CO2 22   < > 23   < >  --    < > 19* 21* 22  GLUCOSE 95   < > 101*   < >  --    < > 127* 135* 129*  BUN 23   < > 9   < >  --    < > 14 12 10   CREATININE 1.11*   < > 0.49   < >  --    < > 0.92 0.56 0.76  CALCIUM 10.6*   < > 9.2   < >  --    < > 9.4 8.8* 9.4  GFRNONAA 50*   < > >60   < >  --    < > >60 >60 >60  GFRAA 57*   < > >60   < >  --    < > >60 >60 >60  PROT 7.7   < >  --    < > 7.3   < > 7.3 7.3 6.8  ALBUMIN 3.9   < >  --    < > 3.8   < > 3.7 3.7 3.5  AST 61*   < >  --    < > 94*   < > 28 22 23   ALT 121*   < >  --    < > 80*   < > 14 11 13   ALKPHOS 275*   < >  --    < > 200*   < > 125 122 116  BILITOT 11.7*   < > 13.7*    < > 6.2*   < > 0.5 1.1 0.6  BILIDIR 8.84*  --  8.2*  --  4.58*  --   --   --   --    < > = values in this interval not displayed.   Iron/TIBC/Ferritin/ %Sat    Component Value Date/Time   IRON 77 05/04/2018 0831   IRON 97 04/09/2018 1440   TIBC 320 05/04/2018 0831  TIBC 340 04/09/2018 1440   FERRITIN 496 (H) 05/04/2018 0831   FERRITIN 1,877 (H) 04/09/2018 1440   IRONPCTSAT 24 05/04/2018 0831   IRONPCTSAT 29 04/09/2018 1440     post op CA 19.9 is 21.    ASSESSMENT & PLAN:  1. Malignant neoplasm of head of pancreas ([place])   2. Hypomagnesemia   3. Encounter for antineoplastic chemotherapy   4. Hyponatremia   5. Thrombocytopenia ([place])   6. Anemia due to antineoplastic chemotherapy    # pT2 pN1 M0, Stage IIB pancreatic cancer S/p whipple resection.  Status post 3 cycle of FOLFIRINOX She has had GI toxicities and hematological toxicities after cycle 1 FOLFIRINOX. 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 Okay to proceed with cycle 4 FOLFIRINOX with 50% dose reduction of irinotecan.  # Grade 1 diarrhea, continue Imodium as instructed. #Chronic hyponatremia, likely secondary to decreased oral intake, and dehydration.  Chronic.  Sodium level has been stable. We will proceed with 1 L of IV normal saline along with her chemotherapy. She will repeat another IV fluid hydration session on day 3.  #She will get G-CSF support with improvement on day 3.  #Chemotherapy-induced thrombocytopenia, will close monitor labs weekly. #Chemotherapy-induced anemia.  Hemoglobin stable.  Continue to monitor.  Labs weekly.  # Hypomagnesia, proceed with 2 g IV mag sulfate today.  Advised patient to continue 2 tablets of slow magnesium  daily. # Mild Hyponatremia, proceed with 1 L of IV normal saline and repeat another hydration session on Day 3.    #Anemia, secondary to chemotherapy.  Stable., continue to monitor.   Return to clinic with lab,+/- IV fluid in 1 week and 1 week Labs MD assessment, +/- IV fluid and Magnesium, next cycle of treatment in 2 weeks.   We spent sufficient time to discuss many aspect of care, questions were answered to patient's satisfaction. Total face to face encounter time for this patient visit was 25 min. >50% of the time was  spent in counseling and coordination of care.    Earlie Server, MD, PhD Hematology Oncology [facility] Pager- [phone] 09/01/2018

## 2018-09-01 NOTE — Progress Notes (Signed)
Platelets: 95,000. MD, Dr. Tasia Catchings, aware. Per MD order: proceed with scheduled treatment today.  6219- Heart rate: 108. MD, Dr. Tasia Catchings, notified. Per MD order: proceed with scheduled treatment at this time.

## 2018-09-01 NOTE — Progress Notes (Signed)
Patient here for follow up. States she has not been eating well due to not having a taste. Has lost 4 pounds since last visit. Requesting refill on potassium, if needed.

## 2018-09-02 ENCOUNTER — Other Ambulatory Visit: Payer: Self-pay

## 2018-09-03 ENCOUNTER — Other Ambulatory Visit: Payer: Self-pay

## 2018-09-03 ENCOUNTER — Inpatient Hospital Stay: Payer: Medicare Other

## 2018-09-03 ENCOUNTER — Ambulatory Visit: Payer: Medicare Other | Admitting: Physician Assistant

## 2018-09-03 VITALS — BP 143/84 | HR 99 | Temp 98.0°F | Resp 20

## 2018-09-03 DIAGNOSIS — E871 Hypo-osmolality and hyponatremia: Secondary | ICD-10-CM

## 2018-09-03 DIAGNOSIS — C250 Malignant neoplasm of head of pancreas: Secondary | ICD-10-CM

## 2018-09-03 MED ORDER — SODIUM CHLORIDE 0.9% FLUSH
10.0000 mL | INTRAVENOUS | Status: DC | PRN
  Administered 2018-09-03: 10 mL
  Filled 2018-09-03: qty 10

## 2018-09-03 MED ORDER — SODIUM CHLORIDE 0.9 % IV SOLN
Freq: Once | INTRAVENOUS | Status: AC
  Administered 2018-09-03: 14:00:00 via INTRAVENOUS
  Filled 2018-09-03: qty 250

## 2018-09-03 MED ORDER — PEGFILGRASTIM 6 MG/0.6ML ~~LOC~~ PSKT
6.0000 mg | PREFILLED_SYRINGE | Freq: Once | SUBCUTANEOUS | Status: AC
  Administered 2018-09-03: 6 mg via SUBCUTANEOUS
  Filled 2018-09-03: qty 0.6

## 2018-09-03 MED ORDER — HEPARIN SOD (PORK) LOCK FLUSH 100 UNIT/ML IV SOLN
500.0000 [IU] | Freq: Once | INTRAVENOUS | Status: AC | PRN
  Administered 2018-09-03: 500 [IU]
  Filled 2018-09-03: qty 5

## 2018-09-06 ENCOUNTER — Other Ambulatory Visit: Payer: Medicare Other

## 2018-09-06 ENCOUNTER — Ambulatory Visit: Payer: Medicare Other

## 2018-09-07 ENCOUNTER — Other Ambulatory Visit: Payer: Self-pay

## 2018-09-08 ENCOUNTER — Inpatient Hospital Stay: Payer: Medicare Other

## 2018-09-08 ENCOUNTER — Inpatient Hospital Stay: Payer: Medicare Other | Attending: Oncology | Admitting: *Deleted

## 2018-09-08 ENCOUNTER — Other Ambulatory Visit: Payer: Self-pay

## 2018-09-08 VITALS — BP 109/75 | HR 100 | Temp 98.0°F | Resp 17

## 2018-09-08 DIAGNOSIS — D6959 Other secondary thrombocytopenia: Secondary | ICD-10-CM | POA: Insufficient documentation

## 2018-09-08 DIAGNOSIS — E871 Hypo-osmolality and hyponatremia: Secondary | ICD-10-CM | POA: Diagnosis not present

## 2018-09-08 DIAGNOSIS — Z87891 Personal history of nicotine dependence: Secondary | ICD-10-CM | POA: Diagnosis not present

## 2018-09-08 DIAGNOSIS — E860 Dehydration: Secondary | ICD-10-CM | POA: Insufficient documentation

## 2018-09-08 DIAGNOSIS — Z95828 Presence of other vascular implants and grafts: Secondary | ICD-10-CM

## 2018-09-08 DIAGNOSIS — I10 Essential (primary) hypertension: Secondary | ICD-10-CM | POA: Insufficient documentation

## 2018-09-08 DIAGNOSIS — R7989 Other specified abnormal findings of blood chemistry: Secondary | ICD-10-CM | POA: Insufficient documentation

## 2018-09-08 DIAGNOSIS — K521 Toxic gastroenteritis and colitis: Secondary | ICD-10-CM | POA: Diagnosis not present

## 2018-09-08 DIAGNOSIS — K219 Gastro-esophageal reflux disease without esophagitis: Secondary | ICD-10-CM | POA: Diagnosis not present

## 2018-09-08 DIAGNOSIS — D696 Thrombocytopenia, unspecified: Secondary | ICD-10-CM

## 2018-09-08 DIAGNOSIS — Z79899 Other long term (current) drug therapy: Secondary | ICD-10-CM | POA: Diagnosis not present

## 2018-09-08 DIAGNOSIS — C250 Malignant neoplasm of head of pancreas: Secondary | ICD-10-CM | POA: Diagnosis present

## 2018-09-08 DIAGNOSIS — Z5111 Encounter for antineoplastic chemotherapy: Secondary | ICD-10-CM | POA: Insufficient documentation

## 2018-09-08 DIAGNOSIS — Z5189 Encounter for other specified aftercare: Secondary | ICD-10-CM | POA: Diagnosis not present

## 2018-09-08 DIAGNOSIS — T451X5A Adverse effect of antineoplastic and immunosuppressive drugs, initial encounter: Secondary | ICD-10-CM | POA: Insufficient documentation

## 2018-09-08 DIAGNOSIS — D6481 Anemia due to antineoplastic chemotherapy: Secondary | ICD-10-CM

## 2018-09-08 DIAGNOSIS — G620 Drug-induced polyneuropathy: Secondary | ICD-10-CM | POA: Insufficient documentation

## 2018-09-08 DIAGNOSIS — Z90411 Acquired partial absence of pancreas: Secondary | ICD-10-CM | POA: Insufficient documentation

## 2018-09-08 DIAGNOSIS — E039 Hypothyroidism, unspecified: Secondary | ICD-10-CM | POA: Diagnosis not present

## 2018-09-08 LAB — CBC WITH DIFFERENTIAL/PLATELET
Abs Immature Granulocytes: 0.04 10*3/uL (ref 0.00–0.07)
Basophils Absolute: 0 10*3/uL (ref 0.0–0.1)
Basophils Relative: 1 %
Eosinophils Absolute: 0 10*3/uL (ref 0.0–0.5)
Eosinophils Relative: 0 %
HCT: 27.2 % — ABNORMAL LOW (ref 36.0–46.0)
Hemoglobin: 8.9 g/dL — ABNORMAL LOW (ref 12.0–15.0)
Immature Granulocytes: 2 %
Lymphocytes Relative: 47 %
Lymphs Abs: 0.9 10*3/uL (ref 0.7–4.0)
MCH: 26.5 pg (ref 26.0–34.0)
MCHC: 32.7 g/dL (ref 30.0–36.0)
MCV: 81 fL (ref 80.0–100.0)
Monocytes Absolute: 0.1 10*3/uL (ref 0.1–1.0)
Monocytes Relative: 6 %
Neutro Abs: 0.9 10*3/uL — ABNORMAL LOW (ref 1.7–7.7)
Neutrophils Relative %: 44 %
Platelets: 52 10*3/uL — ABNORMAL LOW (ref 150–400)
RBC: 3.36 MIL/uL — ABNORMAL LOW (ref 3.87–5.11)
RDW: 19.7 % — ABNORMAL HIGH (ref 11.5–15.5)
Smear Review: DECREASED
WBC: 1.9 10*3/uL — ABNORMAL LOW (ref 4.0–10.5)
nRBC: 0 % (ref 0.0–0.2)

## 2018-09-08 LAB — COMPREHENSIVE METABOLIC PANEL
ALT: 10 U/L (ref 0–44)
AST: 18 U/L (ref 15–41)
Albumin: 3.6 g/dL (ref 3.5–5.0)
Alkaline Phosphatase: 87 U/L (ref 38–126)
Anion gap: 7 (ref 5–15)
BUN: 7 mg/dL — ABNORMAL LOW (ref 8–23)
CO2: 22 mmol/L (ref 22–32)
Calcium: 9.1 mg/dL (ref 8.9–10.3)
Chloride: 102 mmol/L (ref 98–111)
Creatinine, Ser: 0.6 mg/dL (ref 0.44–1.00)
GFR calc Af Amer: >60 mL/min (ref >60)
GFR calc non Af Amer: >60 mL/min (ref >60)
Glucose, Bld: 119 mg/dL — ABNORMAL HIGH (ref 70–99)
Potassium: 3.5 mmol/L (ref 3.5–5.1)
Sodium: 131 mmol/L — ABNORMAL LOW (ref 135–145)
Total Bilirubin: 0.9 mg/dL (ref 0.3–1.2)
Total Protein: 6.8 g/dL (ref 6.5–8.1)

## 2018-09-08 LAB — MAGNESIUM: Magnesium: 1.9 mg/dL (ref 1.7–2.4)

## 2018-09-08 LAB — SAMPLE TO BLOOD BANK

## 2018-09-08 MED ORDER — SODIUM CHLORIDE 0.9% FLUSH
10.0000 mL | Freq: Once | INTRAVENOUS | Status: AC
  Administered 2018-09-08: 10 mL via INTRAVENOUS
  Filled 2018-09-08: qty 10

## 2018-09-08 MED ORDER — SODIUM CHLORIDE 0.9% FLUSH
3.0000 mL | Freq: Once | INTRAVENOUS | Status: DC | PRN
  Filled 2018-09-08: qty 3

## 2018-09-08 MED ORDER — SODIUM CHLORIDE 0.9 % IV SOLN
Freq: Once | INTRAVENOUS | Status: AC
  Administered 2018-09-08: 14:00:00 via INTRAVENOUS
  Filled 2018-09-08: qty 250

## 2018-09-08 MED ORDER — HEPARIN SOD (PORK) LOCK FLUSH 100 UNIT/ML IV SOLN
500.0000 [IU] | Freq: Once | INTRAVENOUS | Status: AC | PRN
  Administered 2018-09-08: 500 [IU]
  Filled 2018-09-08: qty 5

## 2018-09-14 ENCOUNTER — Other Ambulatory Visit: Payer: Self-pay

## 2018-09-15 ENCOUNTER — Ambulatory Visit: Payer: Medicare Other | Admitting: Family Medicine

## 2018-09-15 ENCOUNTER — Inpatient Hospital Stay (HOSPITAL_BASED_OUTPATIENT_CLINIC_OR_DEPARTMENT_OTHER): Payer: Medicare Other | Admitting: Oncology

## 2018-09-15 ENCOUNTER — Other Ambulatory Visit: Payer: Self-pay

## 2018-09-15 ENCOUNTER — Encounter: Payer: Self-pay | Admitting: Oncology

## 2018-09-15 ENCOUNTER — Inpatient Hospital Stay: Payer: Medicare Other

## 2018-09-15 VITALS — BP 103/62 | HR 119 | Temp 99.3°F | Resp 18 | Wt 144.8 lb

## 2018-09-15 DIAGNOSIS — I10 Essential (primary) hypertension: Secondary | ICD-10-CM

## 2018-09-15 DIAGNOSIS — R7989 Other specified abnormal findings of blood chemistry: Secondary | ICD-10-CM

## 2018-09-15 DIAGNOSIS — Z95828 Presence of other vascular implants and grafts: Secondary | ICD-10-CM

## 2018-09-15 DIAGNOSIS — E8889 Other specified metabolic disorders: Secondary | ICD-10-CM

## 2018-09-15 DIAGNOSIS — E871 Hypo-osmolality and hyponatremia: Secondary | ICD-10-CM

## 2018-09-15 DIAGNOSIS — Z90411 Acquired partial absence of pancreas: Secondary | ICD-10-CM

## 2018-09-15 DIAGNOSIS — D696 Thrombocytopenia, unspecified: Secondary | ICD-10-CM

## 2018-09-15 DIAGNOSIS — Z79899 Other long term (current) drug therapy: Secondary | ICD-10-CM

## 2018-09-15 DIAGNOSIS — C250 Malignant neoplasm of head of pancreas: Secondary | ICD-10-CM

## 2018-09-15 DIAGNOSIS — E860 Dehydration: Secondary | ICD-10-CM | POA: Diagnosis not present

## 2018-09-15 DIAGNOSIS — D6959 Other secondary thrombocytopenia: Secondary | ICD-10-CM

## 2018-09-15 DIAGNOSIS — E039 Hypothyroidism, unspecified: Secondary | ICD-10-CM

## 2018-09-15 DIAGNOSIS — D6481 Anemia due to antineoplastic chemotherapy: Secondary | ICD-10-CM

## 2018-09-15 DIAGNOSIS — K219 Gastro-esophageal reflux disease without esophagitis: Secondary | ICD-10-CM

## 2018-09-15 DIAGNOSIS — T451X5A Adverse effect of antineoplastic and immunosuppressive drugs, initial encounter: Secondary | ICD-10-CM

## 2018-09-15 DIAGNOSIS — K521 Toxic gastroenteritis and colitis: Secondary | ICD-10-CM

## 2018-09-15 DIAGNOSIS — Z87891 Personal history of nicotine dependence: Secondary | ICD-10-CM

## 2018-09-15 DIAGNOSIS — Z5111 Encounter for antineoplastic chemotherapy: Secondary | ICD-10-CM | POA: Diagnosis not present

## 2018-09-15 LAB — COMPREHENSIVE METABOLIC PANEL
ALT: 14 U/L (ref 0–44)
AST: 30 U/L (ref 15–41)
Albumin: 3.3 g/dL — ABNORMAL LOW (ref 3.5–5.0)
Alkaline Phosphatase: 146 U/L — ABNORMAL HIGH (ref 38–126)
Anion gap: 9 (ref 5–15)
BUN: 16 mg/dL (ref 8–23)
CO2: 22 mmol/L (ref 22–32)
Calcium: 9.3 mg/dL (ref 8.9–10.3)
Chloride: 95 mmol/L — ABNORMAL LOW (ref 98–111)
Creatinine, Ser: 0.95 mg/dL (ref 0.44–1.00)
GFR calc Af Amer: >60 mL/min (ref >60)
GFR calc non Af Amer: 60 mL/min — ABNORMAL LOW (ref >60)
Glucose, Bld: 113 mg/dL — ABNORMAL HIGH (ref 70–99)
Potassium: 3.6 mmol/L (ref 3.5–5.1)
Sodium: 126 mmol/L — ABNORMAL LOW (ref 135–145)
Total Bilirubin: 0.8 mg/dL (ref 0.3–1.2)
Total Protein: 6.6 g/dL (ref 6.5–8.1)

## 2018-09-15 LAB — CBC WITH DIFFERENTIAL/PLATELET
Abs Immature Granulocytes: 1.53 10*3/uL — ABNORMAL HIGH (ref 0.00–0.07)
Basophils Absolute: 0.1 10*3/uL (ref 0.0–0.1)
Basophils Relative: 1 %
Eosinophils Absolute: 0 10*3/uL (ref 0.0–0.5)
Eosinophils Relative: 0 %
HCT: 26.9 % — ABNORMAL LOW (ref 36.0–46.0)
Hemoglobin: 8.7 g/dL — ABNORMAL LOW (ref 12.0–15.0)
Immature Granulocytes: 6 %
Lymphocytes Relative: 10 %
Lymphs Abs: 2.3 10*3/uL (ref 0.7–4.0)
MCH: 26.3 pg (ref 26.0–34.0)
MCHC: 32.3 g/dL (ref 30.0–36.0)
MCV: 81.3 fL (ref 80.0–100.0)
Monocytes Absolute: 2 10*3/uL — ABNORMAL HIGH (ref 0.1–1.0)
Monocytes Relative: 8 %
Neutro Abs: 18 10*3/uL — ABNORMAL HIGH (ref 1.7–7.7)
Neutrophils Relative %: 75 %
Platelets: 163 10*3/uL (ref 150–400)
RBC: 3.31 MIL/uL — ABNORMAL LOW (ref 3.87–5.11)
RDW: 21.4 % — ABNORMAL HIGH (ref 11.5–15.5)
Smear Review: ADEQUATE
WBC: 24 10*3/uL — ABNORMAL HIGH (ref 4.0–10.5)
nRBC: 0.2 % (ref 0.0–0.2)

## 2018-09-15 LAB — SAMPLE TO BLOOD BANK

## 2018-09-15 LAB — MAGNESIUM: Magnesium: 1.6 mg/dL — ABNORMAL LOW (ref 1.7–2.4)

## 2018-09-15 MED ORDER — SODIUM CHLORIDE 0.9% FLUSH
10.0000 mL | Freq: Once | INTRAVENOUS | Status: DC | PRN
  Filled 2018-09-15: qty 10

## 2018-09-15 MED ORDER — SODIUM CHLORIDE 0.9 % IV SOLN: 2.0000 g | Freq: Once | INTRAVENOUS | Status: DC

## 2018-09-15 MED ORDER — SODIUM CHLORIDE 0.9% FLUSH
10.0000 mL | Freq: Once | INTRAVENOUS | Status: AC
  Administered 2018-09-15: 10 mL via INTRAVENOUS
  Filled 2018-09-15: qty 10

## 2018-09-15 MED ORDER — HEPARIN SOD (PORK) LOCK FLUSH 100 UNIT/ML IV SOLN
500.0000 [IU] | Freq: Once | INTRAVENOUS | Status: AC | PRN
  Administered 2018-09-15: 500 [IU]
  Filled 2018-09-15: qty 5

## 2018-09-15 MED ORDER — SODIUM CHLORIDE 0.9 % IV SOLN
Freq: Once | INTRAVENOUS | Status: AC
  Administered 2018-09-15: 10:00:00 via INTRAVENOUS
  Filled 2018-09-15: qty 250

## 2018-09-15 MED ORDER — MAGNESIUM SULFATE 2 GM/50ML IV SOLN
2.0000 g | Freq: Once | INTRAVENOUS | Status: AC
  Administered 2018-09-15: 2 g via INTRAVENOUS
  Filled 2018-09-15: qty 50

## 2018-09-15 NOTE — Progress Notes (Signed)
Hematology/Oncology follow-up note [facility] Telephone:([phone] [phone]   Patient Care Team: Jodelle Green, FNP as PCP - General (Family Medicine) Clent Jacks, RN as Registered Nurse  REFERRING PROVIDER: Evie Lacks CHIEF COMPLAINTS/REASON FOR VISIT:  Follow-up for management of pancreatic cancer.  HISTORY OF PRESENTING ILLNESS:  Lynn Taylor is a  73 y.o.  female with PMH listed below who was referred to me for evaluation of evaluation of elevated ferritin. Patient recently had lab work-up done on 04/09/2018 which showed ferritin level 1877, iron 97, TIBC 340, iron saturation 29, 04/11/2018 CBC showed hemoglobin 13.2, MCV 81, WBC 7.6, platelet counts 246,000. Patient was referred to hematology for further evaluation of elevated ferritin.  #04/28/2018 Ultrasound of liver was obtained which showed CBD and intrahepatic biliary dilatation Patient wa she is s advised to go to emergency room for further evaluation. Also had hyperbilirubinemia.  Patient was complaining about being jaundiced, pruritus all over. MR abdomen MRCP with and without contrast showed market biliary duct dilatation, secondary to obstruction in the region of pancreatic head.  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 No abdominal adenopathy, liver metastasis or cross vascular involvement.  patient was evaluated by gastroenterology and status post ERCP with stenting. CA 19.9 1173 CEA 3.9 # 05/25/2018  patient was referred to [facility] and s/p whipple resection.Her postop course was c/b Type A pancreatic leak and c.diff. Pathology showed A. Hepatic artery lymph node, excision: One lymph node, negative for malignancy (0/1). B. Biliary stent, removal: Medical device, consistent with stent, gross examination only. C. 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 All margins are negative (closest margin=uncinate, 0.5 mm) Metastatic adenocarcinoma in one of thirty-two lymph nodes (1/32).  Portion of stomach and duodenum with no specific pathologic diagnosis.   Grade, 3 poorly differentiated.  Pathologic pT2 pN1  Cancer Treatment 07/13/2018 Cycle 1 FOLFIRINOX with Onpro Neulasta.  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 female who has above history reviewed by me today presents for follow up visit for evaluation prior to cycle 5 dose reduced modified FOLFIRINOX. Patient reports feeling well until this morning.  She felt little nausea, no vomiting. Also had one loose bowel movement this morning.  Report having one episode of loose bowel movement every other day.  She has taken imodium as instructed.  Takes Slow mag 2 tablets daily.   Denies any abdominal pain, fever, chills, Appetite is poor. She admits that she has not been drinking much water lately.  Weight has decreased 6 pounds since 2 weeks ago.   Review of Systems  Constitutional: Positive for fatigue. Negative for appetite change, chills and fever.  HENT:   Negative for hearing loss and voice change.   Eyes: Negative for eye problems.  Respiratory: Negative for chest tightness and cough.   Cardiovascular: Negative for chest pain.  Gastrointestinal: Positive for diarrhea. Negative for abdominal distention, abdominal pain, blood in stool and nausea.  Endocrine: Negative for hot  flashes.  Genitourinary: Negative for difficulty  urinating, frequency and hematuria.   Musculoskeletal: Negative for arthralgias.  Skin: Negative for itching and rash.  Neurological: Negative for extremity weakness.  Hematological: Negative for adenopathy. Does not bruise/bleed easily.  Psychiatric/Behavioral: Negative for confusion.     MEDICAL HISTORY:  Past Medical History:  Diagnosis Date  . Cancer ([place])   . GERD (gastroesophageal reflux disease)   . Hypertension   . Hypothyroidism   . Malignant neoplasm of head of pancreas ([place]) 06/08/2018    SURGICAL HISTORY: Past Surgical History:  Procedure Laterality Date  . CHOLECYSTECTOMY    . COLONOSCOPY N/A 12/02/2016   Procedure: COLONOSCOPY;  Surgeon: Lollie Sails, MD;  Location: [facility] ENDOSCOPY;  Service: Endoscopy;  Laterality: N/A;  . ERCP N/A 04/30/2018   Procedure: ENDOSCOPIC RETROGRADE CHOLANGIOPANCREATOGRAPHY (ERCP);  Surgeon: Lucilla Lame, MD;  Location: [facility] ENDOSCOPY;  Service: Endoscopy;  Laterality: N/A;  . ERCP N/A 05/05/2018   Procedure: ENDOSCOPIC RETROGRADE CHOLANGIOPANCREATOGRAPHY (ERCP);  Surgeon: Lucilla Lame, MD;  Location: [facility] ENDOSCOPY;  Service: Endoscopy;  Laterality: N/A;  . PORTA CATH INSERTION N/A 06/28/2018   Procedure: PORTA CATH INSERTION;  Surgeon: Algernon Huxley, MD;  Location: [place] CV LAB;  Service: Cardiovascular;  Laterality: N/A;    SOCIAL HISTORY: Social History   Socioeconomic History  . Marital status: Married    Spouse name: Not on file  . Number of children: Not on file  . Years of education: Not on file  . Highest education level: Not on file  Occupational History  . Not on file  Social Needs  . Financial resource strain: Not on file  . Food insecurity:    Worry: Not on file    Inability: Not on file  . Transportation needs:    Medical: Not on file    Non-medical: Not on file  Tobacco Use  . Smoking status: Former Smoker    Last attempt to quit: 04/14/1988     Years since quitting: 30.4  . Smokeless tobacco: Never Used  Substance and Sexual Activity  . Alcohol use: Yes    Comment: social drinker  . Drug use: No  . Sexual activity: Not Currently  Lifestyle  . Physical activity:    Days per week: Not on file    Minutes per session: Not on file  . Stress: Not on file  Relationships  . Social connections:    Talks on phone: Not on file    Gets together: Not on file    Attends religious service: Not on file    Active member of club or organization: Not on file    Attends meetings of clubs or organizations: Not on file    Relationship status: Not on file  . Intimate partner violence:    Fear of current or ex partner: Not on file    Emotionally abused: Not on file    Physically abused: Not on file    Forced sexual activity: Not on file  Other Topics Concern  . Not on file  Social History Narrative  . Not on file    FAMILY HISTORY: Family History  Problem Relation Age of Onset  . Diabetes Mellitus I Other   . Alcoholism Other   . Hypertension Other   . Hyperlipidemia Other   . Coronary artery disease Other   . Stroke Other   . Osteoarthritis Other   . Migraines Other   . Heart Problems Mother   . Stroke Sister   . CAD Brother   . Stroke Brother   .  Breast cancer Neg Hx     ALLERGIES:  has No Known Allergies.  MEDICATIONS:  Current Outpatient Medications  Medication Sig Dispense Refill  . chlorhexidine (PERIDEX) 0.12 % solution Use as directed 15 mLs in the mouth or throat 2 (two) times daily. 473 mL 3  . Cholecalciferol (VITAMIN D-3) 25 MCG (1000 UT) CAPS Take by mouth.    . dexamethasone (DECADRON) 4 MG tablet Take 2 tablets (8 mg total) by mouth daily. Start the day after chemo for 2 days. 8 tablet 5  . levothyroxine (SYNTHROID, LEVOTHROID) 100 MCG tablet Take 1 tablet (100 mcg total) by mouth daily before breakfast. 90 tablet 4  . loperamide (IMODIUM) 2 MG capsule Take 1 capsule (2 mg total) by mouth See admin  instructions. With onset of loose stool, take 4mg  followed by 2mg  every 2 hours until 12 hours have passed without loose bowel movement. Maximum: 16 mg/day 120 capsule 1  . Magnesium Cl-Calcium Carbonate (SLOW-MAG PO) Take 2 tablets by mouth daily.    Marland Kitchen omeprazole (PRILOSEC) 20 MG capsule Take 1 capsule (20 mg total) by mouth daily. 30 capsule 2  . ondansetron (ZOFRAN) 4 MG tablet Take 1 tablet (4 mg total) by mouth every 6 (six) hours as needed for nausea or vomiting. 30 tablet 3  . potassium chloride SA (K-DUR,KLOR-CON) 20 MEQ tablet Take 2 tablets (40 mEq total) by mouth daily. 28 tablet 0  . prochlorperazine (COMPAZINE) 10 MG tablet Take 1 tablet (10 mg total) by mouth every 6 (six) hours as needed (NAUSEA). 30 tablet 1  . traMADol (ULTRAM) 50 MG tablet Take 1 tablet (50 mg total) by mouth every 6 (six) hours as needed. 30 tablet 0   No current facility-administered medications for this visit.      PHYSICAL EXAMINATION: ECOG PERFORMANCE STATUS: 1 - Symptomatic but completely ambulatory Vitals:   09/15/18 0912  BP: 103/62  Pulse: (!) 119  Resp: 18  Temp: 99.3 F (37.4 C)   Filed Weights   09/15/18 0912  Weight: 144 lb 12.8 oz (65.7 kg)    Physical Exam Constitutional:      General: She is not in acute distress. HENT:     Head: Normocephalic and atraumatic.     Mouth/Throat:     Comments: No thrush, dry oral mucosa.  Eyes:     General: No scleral icterus.    Pupils: Pupils are equal, round, and reactive to light.  Neck:     Musculoskeletal: Normal range of motion and neck supple.  Cardiovascular:     Rate and Rhythm: Normal rate and regular rhythm.     Heart sounds: Normal heart sounds.  Pulmonary:     Effort: Pulmonary effort is normal. No respiratory distress.     Breath sounds: No wheezing.  Abdominal:     General: Bowel sounds are normal. There is no distension.     Palpations: Abdomen is soft. There is no mass.     Tenderness: There is no abdominal tenderness.   Musculoskeletal: Normal range of motion.        General: No deformity.  Skin:    General: Skin is warm and dry.     Findings: No erythema or rash.  Neurological:     Mental Status: She is alert and oriented to person, place, and time.     Cranial Nerves: No cranial nerve deficit.     Coordination: Coordination normal.  Psychiatric:        Behavior: Behavior normal.  Thought Content: Thought content normal.      LABORATORY DATA:  I have reviewed the data as listed Lab Results  Component Value Date   WBC 24.0 (H) 09/15/2018   HGB 8.7 (L) 09/15/2018   HCT 26.9 (L) 09/15/2018   MCV 81.3 09/15/2018   PLT 163 09/15/2018   Recent Labs    04/21/18 1046  04/29/18 0612  05/10/18 1429  09/01/18 0823 09/08/18 1313 09/15/18 0850  NA 137   < > 136   < >  --    < > 131* 131* 126*  K 3.8   < > 3.3*   < >  --    < > 3.5 3.5 3.6  CL 100   < > 108   < >  --    < > 100 102 95*  CO2 22   < > 23   < >  --    < > 22 22 22   GLUCOSE 95   < > 101*   < >  --    < > 129* 119* 113*  BUN 23   < > 9   < >  --    < > 10 7* 16  CREATININE 1.11*   < > 0.49   < >  --    < > 0.76 0.60 0.95  CALCIUM 10.6*   < > 9.2   < >  --    < > 9.4 9.1 9.3  GFRNONAA 50*   < > >60   < >  --    < > >60 >60 60*  GFRAA 57*   < > >60   < >  --    < > >60 >60 >60  PROT 7.7   < >  --    < > 7.3   < > 6.8 6.8 6.6  ALBUMIN 3.9   < >  --    < > 3.8   < > 3.5 3.6 3.3*  AST 61*   < >  --    < > 94*   < > 23 18 30   ALT 121*   < >  --    < > 80*   < > 13 10 14   ALKPHOS 275*   < >  --    < > 200*   < > 116 87 146*  BILITOT 11.7*   < > 13.7*   < > 6.2*   < > 0.6 0.9 0.8  BILIDIR 8.84*  --  8.2*  --  4.58*  --   --   --   --    < > = values in this interval not displayed.   Iron/TIBC/Ferritin/ %Sat    Component Value Date/Time   IRON 77 05/04/2018 0831   IRON 97 04/09/2018 1440   TIBC 320 05/04/2018 0831   TIBC 340 04/09/2018 1440   FERRITIN 496 (H) 05/04/2018 0831   FERRITIN 1,877 (H) 04/09/2018 1440   IRONPCTSAT  24 05/04/2018 0831   IRONPCTSAT 29 04/09/2018 1440     post op CA 19.9 is 21.    ASSESSMENT & PLAN:  1. Malignant neoplasm of head of pancreas ([place])   2. Hyponatremia   3. Hypomagnesemia   4. Thrombocytopenia ([place])   5. Port-A-Cath in place   6. Anemia due to antineoplastic chemotherapy   7. Chemotherapy induced diarrhea   8. 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  Status post 4 cycle of FOLFIRINOX She has had GI toxicities and hematological toxicities after cycle 1 FOLFIRINOX. 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  Labs are reviewed today and discussed with patient.  Hold chemotherapy due to severe hyponatremia - see below. Postpone for 1 week, plan cycle 5  modified FOLFIRINOX with 50% dose reduced Irinotecan  Hyponatremia, dehydration,  Likely due to poor oral intake.  Will proceed with IV fluid 1L NS over 1 hour today.  Trend sodium level daily.  She will receive daily IVF.   Hypomagnesia, continue oral slow mag 2 tablets daily. Will give her IV mag sulfate 2g x 1.   # Grade 1 diarrhea, continue  Imodium as instructed.  #She will get G-CSF support with improvement on day 3.  #Chemotherapy-induced thrombocytopenia,stable. Monitor.  #Chemotherapy-induced anemia.  Stale. Monitor.   We spent sufficient time to discuss many aspect of care, questions were answered to patient's satisfaction.  Return to clinic with lab BMP, Mag daily x 2,+/- IV fluid in 1 week and 1 week Labs MD- assessment-on call physician as I will work remotely next week.  , +/- IV fluid and Magnesium, next cycle of treatment in 1 week.  Earlie Server, MD,  PhD Hematology Oncology [facility] Pager- [phone] 09/15/2018

## 2018-09-15 NOTE — Progress Notes (Signed)
Patient here for follow up. Pt states she is not feeling well. Feeling weak and nauseated. Had a loose stool this morning and took immodium.

## 2018-09-16 ENCOUNTER — Other Ambulatory Visit: Payer: Self-pay

## 2018-09-16 ENCOUNTER — Inpatient Hospital Stay: Payer: Medicare Other

## 2018-09-16 DIAGNOSIS — Z95828 Presence of other vascular implants and grafts: Secondary | ICD-10-CM

## 2018-09-16 DIAGNOSIS — E871 Hypo-osmolality and hyponatremia: Secondary | ICD-10-CM

## 2018-09-16 DIAGNOSIS — Z5111 Encounter for antineoplastic chemotherapy: Secondary | ICD-10-CM | POA: Diagnosis not present

## 2018-09-16 LAB — BASIC METABOLIC PANEL
Anion gap: 8 (ref 5–15)
BUN: 15 mg/dL (ref 8–23)
CO2: 22 mmol/L (ref 22–32)
Calcium: 8.9 mg/dL (ref 8.9–10.3)
Chloride: 97 mmol/L — ABNORMAL LOW (ref 98–111)
Creatinine, Ser: 0.81 mg/dL (ref 0.44–1.00)
GFR calc Af Amer: >60 mL/min (ref >60)
GFR calc non Af Amer: >60 mL/min (ref >60)
Glucose, Bld: 128 mg/dL — ABNORMAL HIGH (ref 70–99)
Potassium: 3.2 mmol/L — ABNORMAL LOW (ref 3.5–5.1)
Sodium: 127 mmol/L — ABNORMAL LOW (ref 135–145)

## 2018-09-16 LAB — MAGNESIUM: Magnesium: 2.2 mg/dL (ref 1.7–2.4)

## 2018-09-16 MED ORDER — POTASSIUM CHLORIDE 20 MEQ/100ML IV SOLN
20.0000 meq | Freq: Once | INTRAVENOUS | Status: AC
  Administered 2018-09-16: 20 meq via INTRAVENOUS

## 2018-09-16 MED ORDER — SODIUM CHLORIDE 0.9% FLUSH
10.0000 mL | Freq: Once | INTRAVENOUS | Status: AC
  Administered 2018-09-16: 10 mL via INTRAVENOUS
  Filled 2018-09-16: qty 10

## 2018-09-16 MED ORDER — SODIUM CHLORIDE 0.9 % IV SOLN
Freq: Once | INTRAVENOUS | Status: AC
  Administered 2018-09-16: 10:00:00 via INTRAVENOUS
  Filled 2018-09-16: qty 250

## 2018-09-16 MED ORDER — HEPARIN SOD (PORK) LOCK FLUSH 100 UNIT/ML IV SOLN
500.0000 [IU] | Freq: Once | INTRAVENOUS | Status: AC | PRN
  Administered 2018-09-16: 500 [IU]
  Filled 2018-09-16: qty 5

## 2018-09-17 ENCOUNTER — Other Ambulatory Visit: Payer: Self-pay

## 2018-09-17 ENCOUNTER — Inpatient Hospital Stay: Payer: Medicare Other | Admitting: *Deleted

## 2018-09-17 ENCOUNTER — Inpatient Hospital Stay: Payer: Medicare Other

## 2018-09-17 DIAGNOSIS — Z5111 Encounter for antineoplastic chemotherapy: Secondary | ICD-10-CM | POA: Diagnosis not present

## 2018-09-17 DIAGNOSIS — E871 Hypo-osmolality and hyponatremia: Secondary | ICD-10-CM

## 2018-09-17 DIAGNOSIS — Z95828 Presence of other vascular implants and grafts: Secondary | ICD-10-CM

## 2018-09-17 LAB — COMPREHENSIVE METABOLIC PANEL
ALT: 15 U/L (ref 0–44)
AST: 30 U/L (ref 15–41)
Albumin: 3.2 g/dL — ABNORMAL LOW (ref 3.5–5.0)
Alkaline Phosphatase: 136 U/L — ABNORMAL HIGH (ref 38–126)
Anion gap: 8 (ref 5–15)
BUN: 12 mg/dL (ref 8–23)
CO2: 23 mmol/L (ref 22–32)
Calcium: 8.8 mg/dL — ABNORMAL LOW (ref 8.9–10.3)
Chloride: 98 mmol/L (ref 98–111)
Creatinine, Ser: 0.7 mg/dL (ref 0.44–1.00)
GFR calc Af Amer: >60 mL/min (ref >60)
GFR calc non Af Amer: >60 mL/min (ref >60)
Glucose, Bld: 101 mg/dL — ABNORMAL HIGH (ref 70–99)
Potassium: 3.2 mmol/L — ABNORMAL LOW (ref 3.5–5.1)
Sodium: 129 mmol/L — ABNORMAL LOW (ref 135–145)
Total Bilirubin: 0.7 mg/dL (ref 0.3–1.2)
Total Protein: 6.4 g/dL — ABNORMAL LOW (ref 6.5–8.1)

## 2018-09-17 LAB — MAGNESIUM: Magnesium: 2.1 mg/dL (ref 1.7–2.4)

## 2018-09-17 MED ORDER — SODIUM CHLORIDE 0.9 % IV SOLN
Freq: Once | INTRAVENOUS | Status: AC
  Administered 2018-09-17: 13:00:00 via INTRAVENOUS
  Filled 2018-09-17: qty 250

## 2018-09-17 MED ORDER — SODIUM CHLORIDE 0.9% FLUSH
10.0000 mL | Freq: Once | INTRAVENOUS | Status: AC | PRN
  Administered 2018-09-17: 10 mL
  Filled 2018-09-17: qty 10

## 2018-09-17 MED ORDER — SODIUM CHLORIDE 0.9% FLUSH
10.0000 mL | Freq: Once | INTRAVENOUS | Status: AC
  Administered 2018-09-17: 10 mL via INTRAVENOUS
  Filled 2018-09-17: qty 10

## 2018-09-17 MED ORDER — HEPARIN SOD (PORK) LOCK FLUSH 100 UNIT/ML IV SOLN
500.0000 [IU] | Freq: Once | INTRAVENOUS | Status: AC | PRN
  Administered 2018-09-17: 500 [IU]
  Filled 2018-09-17: qty 5

## 2018-09-20 ENCOUNTER — Telehealth: Payer: Self-pay

## 2018-09-20 ENCOUNTER — Ambulatory Visit (INDEPENDENT_AMBULATORY_CARE_PROVIDER_SITE_OTHER): Payer: Medicare Other | Admitting: Family Medicine

## 2018-09-20 ENCOUNTER — Other Ambulatory Visit: Payer: Self-pay

## 2018-09-20 ENCOUNTER — Encounter: Payer: Self-pay | Admitting: Family Medicine

## 2018-09-20 DIAGNOSIS — E039 Hypothyroidism, unspecified: Secondary | ICD-10-CM | POA: Diagnosis not present

## 2018-09-20 DIAGNOSIS — R630 Anorexia: Secondary | ICD-10-CM | POA: Insufficient documentation

## 2018-09-20 DIAGNOSIS — C250 Malignant neoplasm of head of pancreas: Secondary | ICD-10-CM | POA: Diagnosis not present

## 2018-09-20 HISTORY — DX: Anorexia: R63.0

## 2018-09-20 NOTE — Progress Notes (Signed)
Patient ID: Lynn Taylor, female   DOB: 1946/02/26, 73 y.o.   MRN: [id number]  Virtual Visit via Video  Note  This visit type was conducted due to national recommendations for restrictions regarding the COVID-19 pandemic (e.g. social distancing).  This format is felt to be most appropriate for this patient at this time.  All issues noted in this document were discussed and addressed.  No physical exam was performed (except for noted visual exam findings with Video Visits).   I connected with Lynn Taylor on 09/20/18 at 10:40 AM EDT by a video enabled telemedicine application and verified that I am speaking with the correct person using two identifiers. Location patient: home Location provider: [facility] Persons participating in the virtual visit: patient, provider  I discussed the limitations, risks, security and privacy concerns of performing an evaluation and management service by video and the availability of in person appointments. I also discussed with the patient that there may be a patient responsible charge related to this service. The patient expressed understanding and agreed to proceed.   HPI:  Patient and I connected via video today for a 74-month follow-up on her hypothyroidism.  She has been taking levothyroxine 100 mcg daily and tolerating without any issues.  She also is currently undergoing chemotherapy infusions due to malignant neoplasm of head of pancreas.  Chemotherapy causes her to have decreased appetite, especially on the days right after her infusion.  States that she was able to eat a small amount of Easter dinner yesterday, and has been trying to keep self hydrated with bland foods and lots of liquids.  Denies fever or chills.  Denies body aches.  Denies vomiting or diarrhea, does have some nausea after chemotherapy but uses Zofran for this.  Denies chest pain, shortness of breath or wheezing.  Denies GU issues.   ROS: See pertinent positives and negatives per  HPI.  Past Medical History:  Diagnosis Date  . Cancer ([place])   . GERD (gastroesophageal reflux disease)   . Hypertension   . Hypothyroidism   . Malignant neoplasm of head of pancreas ([place]) 06/08/2018    Past Surgical History:  Procedure Laterality Date  . CHOLECYSTECTOMY    . COLONOSCOPY N/A 12/02/2016   Procedure: COLONOSCOPY;  Surgeon: Lollie Sails, MD;  Location: [facility] ENDOSCOPY;  Service: Endoscopy;  Laterality: N/A;  . ERCP N/A 04/30/2018   Procedure: ENDOSCOPIC RETROGRADE CHOLANGIOPANCREATOGRAPHY (ERCP);  Surgeon: Lucilla Lame, MD;  Location: [facility] ENDOSCOPY;  Service: Endoscopy;  Laterality: N/A;  . ERCP N/A 05/05/2018   Procedure: ENDOSCOPIC RETROGRADE CHOLANGIOPANCREATOGRAPHY (ERCP);  Surgeon: Lucilla Lame, MD;  Location: [facility] ENDOSCOPY;  Service: Endoscopy;  Laterality: N/A;  . PORTA CATH INSERTION N/A 06/28/2018   Procedure: PORTA CATH INSERTION;  Surgeon: Algernon Huxley, MD;  Location: [place] CV LAB;  Service: Cardiovascular;  Laterality: N/A;   Family History  Problem Relation Age of Onset  . Diabetes Mellitus I Other   . Alcoholism Other   . Hypertension Other   . Hyperlipidemia Other   . Coronary artery disease Other   . Stroke Other   . Osteoarthritis Other   . Migraines Other   . Heart Problems Mother   . Stroke Sister   . CAD Brother   . Stroke Brother   . Breast cancer Neg Hx    Social History   Tobacco Use  . Smoking status: Former Smoker    Last attempt to quit: 04/14/1988    Years since quitting: 30.4  .  Smokeless tobacco: Never Used  Substance Use Topics  . Alcohol use: Yes    Comment: social drinker    Current Outpatient Medications:  .  chlorhexidine (PERIDEX) 0.12 % solution, Use as directed 15 mLs in the mouth or throat 2 (two) times daily., Disp: 473 mL, Rfl: 3 .  Cholecalciferol (VITAMIN D-3) 25 MCG (1000 UT) CAPS, Take by mouth., Disp: , Rfl:  .  dexamethasone (DECADRON) 4 MG tablet, Take 2 tablets (8 mg total) by mouth daily.  Start the day after chemo for 2 days., Disp: 8 tablet, Rfl: 5 .  levothyroxine (SYNTHROID, LEVOTHROID) 100 MCG tablet, Take 1 tablet (100 mcg total) by mouth daily before breakfast., Disp: 90 tablet, Rfl: 4 .  loperamide (IMODIUM) 2 MG capsule, Take 1 capsule (2 mg total) by mouth See admin instructions. With onset of loose stool, take 4mg  followed by 2mg  every 2 hours until 12 hours have passed without loose bowel movement. Maximum: 16 mg/day, Disp: 120 capsule, Rfl: 1 .  Magnesium Cl-Calcium Carbonate (SLOW-MAG PO), Take 2 tablets by mouth daily., Disp: , Rfl:  .  omeprazole (PRILOSEC) 20 MG capsule, Take 1 capsule (20 mg total) by mouth daily., Disp: 30 capsule, Rfl: 2 .  ondansetron (ZOFRAN) 4 MG tablet, Take 1 tablet (4 mg total) by mouth every 6 (six) hours as needed for nausea or vomiting., Disp: 30 tablet, Rfl: 3 .  potassium chloride SA (K-DUR,KLOR-CON) 20 MEQ tablet, Take 2 tablets (40 mEq total) by mouth daily., Disp: 28 tablet, Rfl: 0 .  prochlorperazine (COMPAZINE) 10 MG tablet, Take 1 tablet (10 mg total) by mouth every 6 (six) hours as needed (NAUSEA)., Disp: 30 tablet, Rfl: 1 .  traMADol (ULTRAM) 50 MG tablet, Take 1 tablet (50 mg total) by mouth every 6 (six) hours as needed., Disp: 30 tablet, Rfl: 0  EXAM:  GENERAL: alert, oriented, appears well and in no acute distress  HEENT: atraumatic, conjunttiva clear, no obvious abnormalities on inspection of external nose and ears  NECK: normal movements of the head and neck  LUNGS: on inspection no signs of respiratory distress, breathing rate appears normal, no obvious gross SOB, gasping or wheezing  CV: no obvious cyanosis  MS: moves all visible extremities without noticeable abnormality  PSYCH/NEURO: pleasant and cooperative, no obvious depression or anxiety, speech and thought processing grossly intact  ASSESSMENT AND PLAN:  Discussed the following assessment and plan:  Acquired hypothyroidism  Malignant neoplasm of head  of pancreas (HCC)  Decreased appetite  Patient will continue current dose of levothyroxine.  We will plan to get blood work at her future follow-up visit.  Patient does have regularly monitored labs via oncology due to chemotherapy treatments.  She will continue regular follow-up with oncology and chemo treatments for her malignant neoplasm of pancreas.  We also discussed different options to help her decreased appetite.  Advised to take small sips and small bites of food rather than trying to take large sips and large bites.  Discussed taking an Ensure or a boost shake, adding ice cream and peanut butter and other toppings to make it more like a milkshake- this will both increase her calorie intake as well as provide nutrients via the Ensure or boost.  Patient also has her Zofran use as needed if she has some nausea which is inducing her decreased appetite.  We will have patient follow-up in approximately 4 months for recheck and we will plan to do blood work at that time.   I discussed  the assessment and treatment plan with the patient. The patient was provided an opportunity to ask questions and all were answered. The patient agreed with the plan and demonstrated an understanding of the instructions.   The patient was advised to call back or seek an in-person evaluation if any new issues arise.  Jodelle Green, FNP

## 2018-09-20 NOTE — Telephone Encounter (Signed)
Nutrition Assessment:  Patient identified on Malnutrition Screening tool for weight loss and decreased appetite.   RD working remotely.  73 year old female with pancreatic cancer, s/p whipple. Patient receiving chemotherapy.    Called patient and introduce self and service.  Patient reports patient has no desire to eat also reports taste alterations.  Reports some diarrhea but controlled with imodium.  Reports this am ate few bites of grits and sausage.  Yesterday had few bites of turkey/ham, potato salad, macaroni and cheese, yams (easter dinner).  Denies nausea.      Medications: reviewed  Labs: reviewed  Anthropometrics:   Height: 62 inches Weight: 144 lb  UBW: 197 lb before diagnosis Nov 2019 BMI: 26  27% weight loss in the last 5 months, significant   Estimated Energy Needs  Kcals: 1950-2275 calories Protein: 98-113 g Fluid: per MD with hyponatremia  NUTRITION DIAGNOSIS: Inadequate oral intake related to cancer and cancer related treatment side effects as evidenced by 27% weight loss in 5 months   INTERVENTION:  Discussed strategies to help with taste changes and will email handout. Discussed ways to increase calories and protein in diet.  Encouraged eating q 2 hours Encouraged oral nutrition supplements and will prepare samples for patient to pick up on 4/15 visit.  Contact information given to patient    MONITORING, EVALUATION, GOAL: Patient will consume adequate calories and protein to prevent further weight loss   NEXT VISIT: phone f/u April 27  Ahmyah Gidley B. Zenia Resides, [place], [place] Registered Dietitian [phone] (pager)

## 2018-09-21 ENCOUNTER — Other Ambulatory Visit: Payer: Self-pay

## 2018-09-22 ENCOUNTER — Inpatient Hospital Stay: Payer: Medicare Other

## 2018-09-22 ENCOUNTER — Inpatient Hospital Stay (HOSPITAL_BASED_OUTPATIENT_CLINIC_OR_DEPARTMENT_OTHER): Payer: Medicare Other | Admitting: Internal Medicine

## 2018-09-22 ENCOUNTER — Other Ambulatory Visit: Payer: Self-pay

## 2018-09-22 ENCOUNTER — Ambulatory Visit: Payer: Medicare Other | Admitting: Oncology

## 2018-09-22 VITALS — HR 94 | Resp 19

## 2018-09-22 DIAGNOSIS — K521 Toxic gastroenteritis and colitis: Secondary | ICD-10-CM

## 2018-09-22 DIAGNOSIS — T451X5A Adverse effect of antineoplastic and immunosuppressive drugs, initial encounter: Secondary | ICD-10-CM

## 2018-09-22 DIAGNOSIS — E039 Hypothyroidism, unspecified: Secondary | ICD-10-CM

## 2018-09-22 DIAGNOSIS — E860 Dehydration: Secondary | ICD-10-CM

## 2018-09-22 DIAGNOSIS — C250 Malignant neoplasm of head of pancreas: Secondary | ICD-10-CM

## 2018-09-22 DIAGNOSIS — D6959 Other secondary thrombocytopenia: Secondary | ICD-10-CM

## 2018-09-22 DIAGNOSIS — E871 Hypo-osmolality and hyponatremia: Secondary | ICD-10-CM | POA: Diagnosis not present

## 2018-09-22 DIAGNOSIS — Z5111 Encounter for antineoplastic chemotherapy: Secondary | ICD-10-CM | POA: Diagnosis not present

## 2018-09-22 DIAGNOSIS — K219 Gastro-esophageal reflux disease without esophagitis: Secondary | ICD-10-CM

## 2018-09-22 DIAGNOSIS — I10 Essential (primary) hypertension: Secondary | ICD-10-CM

## 2018-09-22 DIAGNOSIS — D696 Thrombocytopenia, unspecified: Secondary | ICD-10-CM

## 2018-09-22 DIAGNOSIS — Z87891 Personal history of nicotine dependence: Secondary | ICD-10-CM

## 2018-09-22 DIAGNOSIS — Z90411 Acquired partial absence of pancreas: Secondary | ICD-10-CM

## 2018-09-22 DIAGNOSIS — Z79899 Other long term (current) drug therapy: Secondary | ICD-10-CM

## 2018-09-22 DIAGNOSIS — D6481 Anemia due to antineoplastic chemotherapy: Secondary | ICD-10-CM

## 2018-09-22 DIAGNOSIS — Z95828 Presence of other vascular implants and grafts: Secondary | ICD-10-CM

## 2018-09-22 DIAGNOSIS — R7989 Other specified abnormal findings of blood chemistry: Secondary | ICD-10-CM | POA: Diagnosis not present

## 2018-09-22 DIAGNOSIS — G620 Drug-induced polyneuropathy: Secondary | ICD-10-CM

## 2018-09-22 LAB — CBC WITH DIFFERENTIAL/PLATELET
Abs Immature Granulocytes: 0.14 10*3/uL — ABNORMAL HIGH (ref 0.00–0.07)
Basophils Absolute: 0.1 10*3/uL (ref 0.0–0.1)
Basophils Relative: 1 %
Eosinophils Absolute: 0 10*3/uL (ref 0.0–0.5)
Eosinophils Relative: 0 %
HCT: 27.1 % — ABNORMAL LOW (ref 36.0–46.0)
Hemoglobin: 8.8 g/dL — ABNORMAL LOW (ref 12.0–15.0)
Immature Granulocytes: 1 %
Lymphocytes Relative: 21 %
Lymphs Abs: 2.6 10*3/uL (ref 0.7–4.0)
MCH: 26.7 pg (ref 26.0–34.0)
MCHC: 32.5 g/dL (ref 30.0–36.0)
MCV: 82.1 fL (ref 80.0–100.0)
Monocytes Absolute: 0.7 10*3/uL (ref 0.1–1.0)
Monocytes Relative: 6 %
Neutro Abs: 8.7 10*3/uL — ABNORMAL HIGH (ref 1.7–7.7)
Neutrophils Relative %: 71 %
Platelets: 271 10*3/uL (ref 150–400)
RBC: 3.3 MIL/uL — ABNORMAL LOW (ref 3.87–5.11)
RDW: 23 % — ABNORMAL HIGH (ref 11.5–15.5)
WBC: 12.3 10*3/uL — ABNORMAL HIGH (ref 4.0–10.5)
nRBC: 0 % (ref 0.0–0.2)

## 2018-09-22 LAB — COMPREHENSIVE METABOLIC PANEL
ALT: 15 U/L (ref 0–44)
AST: 32 U/L (ref 15–41)
Albumin: 3.2 g/dL — ABNORMAL LOW (ref 3.5–5.0)
Alkaline Phosphatase: 173 U/L — ABNORMAL HIGH (ref 38–126)
Anion gap: 6 (ref 5–15)
BUN: 5 mg/dL — ABNORMAL LOW (ref 8–23)
CO2: 24 mmol/L (ref 22–32)
Calcium: 9.4 mg/dL (ref 8.9–10.3)
Chloride: 102 mmol/L (ref 98–111)
Creatinine, Ser: 0.67 mg/dL (ref 0.44–1.00)
GFR calc Af Amer: >60 mL/min (ref >60)
GFR calc non Af Amer: >60 mL/min (ref >60)
Glucose, Bld: 109 mg/dL — ABNORMAL HIGH (ref 70–99)
Potassium: 3.8 mmol/L (ref 3.5–5.1)
Sodium: 132 mmol/L — ABNORMAL LOW (ref 135–145)
Total Bilirubin: 0.5 mg/dL (ref 0.3–1.2)
Total Protein: 6.8 g/dL (ref 6.5–8.1)

## 2018-09-22 LAB — SAMPLE TO BLOOD BANK

## 2018-09-22 LAB — MAGNESIUM: Magnesium: 2 mg/dL (ref 1.7–2.4)

## 2018-09-22 MED ORDER — PALONOSETRON HCL INJECTION 0.25 MG/5ML
0.2500 mg | Freq: Once | INTRAVENOUS | Status: AC
  Administered 2018-09-22: 0.25 mg via INTRAVENOUS
  Filled 2018-09-22: qty 5

## 2018-09-22 MED ORDER — SODIUM CHLORIDE 0.9 % IV SOLN
Freq: Once | INTRAVENOUS | Status: AC
  Administered 2018-09-22: 10:00:00 via INTRAVENOUS
  Filled 2018-09-22: qty 250

## 2018-09-22 MED ORDER — ATROPINE SULFATE 1 MG/ML IJ SOLN
0.5000 mg | Freq: Once | INTRAMUSCULAR | Status: AC | PRN
  Administered 2018-09-22: 0.5 mg via INTRAVENOUS
  Filled 2018-09-22: qty 1

## 2018-09-22 MED ORDER — LEUCOVORIN CALCIUM INJECTION 350 MG
650.0000 mg | Freq: Once | INTRAVENOUS | Status: AC
  Administered 2018-09-22: 650 mg via INTRAVENOUS
  Filled 2018-09-22: qty 25

## 2018-09-22 MED ORDER — DEXTROSE 5 % IV SOLN
Freq: Once | INTRAVENOUS | Status: AC
  Administered 2018-09-22: 11:00:00 via INTRAVENOUS
  Filled 2018-09-22: qty 250

## 2018-09-22 MED ORDER — IRINOTECAN HCL CHEMO INJECTION 100 MG/5ML
108.0000 mg | Freq: Once | INTRAVENOUS | Status: AC
  Administered 2018-09-22: 100 mg via INTRAVENOUS
  Filled 2018-09-22: qty 5

## 2018-09-22 MED ORDER — SODIUM CHLORIDE 0.9% FLUSH
10.0000 mL | Freq: Once | INTRAVENOUS | Status: AC
  Administered 2018-09-22: 10 mL via INTRAVENOUS
  Filled 2018-09-22: qty 10

## 2018-09-22 MED ORDER — SODIUM CHLORIDE 0.9 % IV SOLN
4150.0000 mg | INTRAVENOUS | Status: DC
  Administered 2018-09-22: 4150 mg via INTRAVENOUS
  Filled 2018-09-22: qty 83

## 2018-09-22 MED ORDER — DEXAMETHASONE SODIUM PHOSPHATE 10 MG/ML IJ SOLN
10.0000 mg | Freq: Once | INTRAMUSCULAR | Status: AC
  Administered 2018-09-22: 10 mg via INTRAVENOUS
  Filled 2018-09-22: qty 1

## 2018-09-22 MED ORDER — IRINOTECAN HCL CHEMO INJECTION 100 MG/5ML: 65.0000 mg/m2 | Freq: Once | INTRAVENOUS | Status: DC

## 2018-09-22 MED ORDER — LEUCOVORIN CALCIUM INJECTION 350 MG: 750.0000 mg | Freq: Once | INTRAVENOUS | Status: DC

## 2018-09-22 MED ORDER — OXALIPLATIN CHEMO INJECTION 100 MG/20ML
80.0000 mg/m2 | Freq: Once | INTRAVENOUS | Status: AC
  Administered 2018-09-22: 135 mg via INTRAVENOUS
  Filled 2018-09-22: qty 20

## 2018-09-22 NOTE — Assessment & Plan Note (Addendum)
#  Pancreatic adenocarcinoma/ pT2 pN1 M0, Stage IIB currently on adjuvant FOLFIRINOX.   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  #Proceed with FOLFIRINOX treatment #5 today. Labs today reviewed;  acceptable for treatment today.   # hyponatremia-improved 132.  # Hypomagnesemia-magnesium 2 stable.  # Elevated Alkaline phosphatase- sec to chemo vs. Other.  Check ca-19-9.   # Grade 1 diarrhea, stable.  Imodium as instructed.  #Chemotherapy-induced thrombocytopenia,stable Monitor.   #Chemotherapy-induced anemia.  Stable. Monitor.   #Peripheral neuropathy oxaliplatin grade 1 stable.  # DISPOSITION: # Chemo today; pump off/onpro Friday. # keep appt with Dr.Yu next week-cbc/bmp/mag- Wed/ IVFs over 1 hou Mag 2 gm/1 hour # in 2 weeks- MD/Dr.B- FOLFIRINOX;/possible Mag over 1 hour  Day-3 pump off/onpro-Dr.B

## 2018-09-22 NOTE — Progress Notes (Signed)
[place] OFFICE PROGRESS NOTE  Patient Care Team: Jodelle Green, FNP as PCP - General (Family Medicine) Clent Jacks, RN as Registered Nurse  Cancer Staging No matching staging information was found for the patient.     Malignant neoplasm of head of pancreas ([place])   06/08/2018 Initial Diagnosis    Malignant neoplasm of head of pancreas ([place])    07/13/2018 -  Chemotherapy    The patient had palonosetron (ALOXI) injection 0.25 mg, 0.25 mg, Intravenous,  Once, 4 of 12 cycles Administration: 0.25 mg (07/13/2018), 0.25 mg (08/03/2018), 0.25 mg (08/18/2018), 0.25 mg (09/01/2018) pegfilgrastim (NEULASTA ONPRO KIT) injection 6 mg, 6 mg, Subcutaneous, Once, 4 of 12 cycles Administration: 6 mg (07/15/2018), 6 mg (08/05/2018), 6 mg (08/20/2018), 6 mg (09/03/2018) irinotecan (CAMPTOSAR) 340 mg in dextrose 5 % 500 mL chemo infusion, 180 mg/m2 = 340 mg, Intravenous,  Once, 4 of 12 cycles Dose modification: 65 mg/m2 (original dose 180 mg/m2, Cycle 5, Reason: Dose not tolerated), 65 mg/m2 (original dose 180 mg/m2, Cycle 2, Reason: Dose not tolerated), 65 mg/m2 (original dose 180 mg/m2, Cycle 3, Reason: Dose not tolerated), 65 mg/m2 (original dose 180 mg/m2, Cycle 4, Reason: Dose not tolerated) Administration: 340 mg (07/13/2018), 120 mg (08/03/2018), 120 mg (08/18/2018), 120 mg (09/01/2018) leucovorin 750 mg in dextrose 5 % 250 mL infusion, 752 mg, Intravenous,  Once, 4 of 12 cycles Administration: 750 mg (07/13/2018), 750 mg (08/03/2018), 750 mg (08/18/2018), 750 mg (09/01/2018) oxaliplatin (ELOXATIN) 150 mg in dextrose 5 % 500 mL chemo infusion, 160 mg, Intravenous,  Once, 4 of 12 cycles Dose modification: 80 mg/m2 (original dose 85 mg/m2, Cycle 5, Reason: Provider Judgment), 75 mg/m2 (original dose 85 mg/m2, Cycle 2, Reason: Dose not tolerated), 80 mg/m2 (original dose 85 mg/m2, Cycle 4, Reason: Dose not tolerated) Administration: 150 mg (07/13/2018), 135 mg (08/03/2018), 150 mg (08/18/2018), 150 mg  (09/01/2018) fluorouracil (ADRUCIL) chemo injection 750 mg, 400 mg/m2 = 750 mg, Intravenous,  Once, 4 of 4 cycles Dose modification: 200 mg/m2 (original dose 400 mg/m2, Cycle 2, Reason: Dose not tolerated) Administration: 750 mg (07/13/2018), 350 mg (08/03/2018), 750 mg (08/18/2018), 700 mg (09/01/2018) fluorouracil (ADRUCIL) 4,500 mg in sodium chloride 0.9 % 60 mL chemo infusion, 2,400 mg/m2 = 4,500 mg, Intravenous, 1 Day/Dose, 4 of 12 cycles Dose modification: 1,200 mg/m2 (original dose 2,400 mg/m2, Cycle 2, Reason: Dose not tolerated) Administration: 4,500 mg (07/13/2018), 2,250 mg (08/03/2018), 4,500 mg (08/18/2018), 4,150 mg (09/01/2018)  for chemotherapy treatment.        INTERVAL HISTORY:  Lynn Taylor 73 y.o.  female pleasant patient above history of stage II pancreatic adenocarcinoma currently on adjuvant FOLFIRINOX chemotherapy.  Patient has intermittent diarrhea otherwise overall improved.  No pain.  Intermittent nausea no vomiting.  No chest pain or shortness of breath or cough.  Mild tingling and numbness. Review of Systems  Constitutional: Positive for malaise/fatigue and weight loss. Negative for chills, diaphoresis and fever.  HENT: Negative for nosebleeds and sore throat.   Eyes: Negative for double vision.  Respiratory: Negative for cough, hemoptysis, sputum production, shortness of breath and wheezing.   Cardiovascular: Negative for chest pain, palpitations, orthopnea and leg swelling.  Gastrointestinal: Positive for diarrhea and nausea. Negative for abdominal pain, blood in stool, constipation, heartburn, melena and vomiting.  Genitourinary: Negative for dysuria, frequency and urgency.  Musculoskeletal: Negative for back pain and joint pain.  Skin: Negative.  Negative for itching and rash.  Neurological: Positive for tingling. Negative for dizziness, focal weakness,  weakness and headaches.  Endo/Heme/Allergies: Does not bruise/bleed easily.  Psychiatric/Behavioral: Negative  for depression. The patient is not nervous/anxious and does not have insomnia.       PAST MEDICAL HISTORY :  Past Medical History:  Diagnosis Date  . Cancer ([place])   . GERD (gastroesophageal reflux disease)   . Hypertension   . Hypothyroidism   . Malignant neoplasm of head of pancreas ([place]) 06/08/2018    PAST SURGICAL HISTORY :   Past Surgical History:  Procedure Laterality Date  . CHOLECYSTECTOMY    . COLONOSCOPY N/A 12/02/2016   Procedure: COLONOSCOPY;  Surgeon: Lollie Sails, MD;  Location: [facility] ENDOSCOPY;  Service: Endoscopy;  Laterality: N/A;  . ERCP N/A 04/30/2018   Procedure: ENDOSCOPIC RETROGRADE CHOLANGIOPANCREATOGRAPHY (ERCP);  Surgeon: Lucilla Lame, MD;  Location: [facility] ENDOSCOPY;  Service: Endoscopy;  Laterality: N/A;  . ERCP N/A 05/05/2018   Procedure: ENDOSCOPIC RETROGRADE CHOLANGIOPANCREATOGRAPHY (ERCP);  Surgeon: Lucilla Lame, MD;  Location: [facility] ENDOSCOPY;  Service: Endoscopy;  Laterality: N/A;  . PORTA CATH INSERTION N/A 06/28/2018   Procedure: PORTA CATH INSERTION;  Surgeon: Algernon Huxley, MD;  Location: [facility] CV LAB;  Service: Cardiovascular;  Laterality: N/A;    FAMILY HISTORY :   Family History  Problem Relation Age of Onset  . Diabetes Mellitus I Other   . Alcoholism Other   . Hypertension Other   . Hyperlipidemia Other   . Coronary artery disease Other   . Stroke Other   . Osteoarthritis Other   . Migraines Other   . Heart Problems Mother   . Stroke Sister   . CAD Brother   . Stroke Brother   . Breast cancer Neg Hx     SOCIAL HISTORY:   Social History   Tobacco Use  . Smoking status: Former Smoker    Last attempt to quit: 04/14/1988    Years since quitting: 30.4  . Smokeless tobacco: Never Used  Substance Use Topics  . Alcohol use: Yes    Comment: social drinker  . Drug use: No    ALLERGIES:  has No Known Allergies.  MEDICATIONS:  Current Outpatient Medications  Medication Sig Dispense Refill  . chlorhexidine (PERIDEX)  0.12 % solution Use as directed 15 mLs in the mouth or throat 2 (two) times daily. 473 mL 3  . Cholecalciferol (VITAMIN D-3) 25 MCG (1000 UT) CAPS Take by mouth.    . levothyroxine (SYNTHROID, LEVOTHROID) 100 MCG tablet Take 1 tablet (100 mcg total) by mouth daily before breakfast. 90 tablet 4  . Magnesium Cl-Calcium Carbonate (SLOW-MAG PO) Take 2 tablets by mouth daily.    Marland Kitchen omeprazole (PRILOSEC) 20 MG capsule Take 1 capsule (20 mg total) by mouth daily. 30 capsule 2  . ondansetron (ZOFRAN) 4 MG tablet Take 1 tablet (4 mg total) by mouth every 6 (six) hours as needed for nausea or vomiting. 30 tablet 3  . potassium chloride SA (K-DUR,KLOR-CON) 20 MEQ tablet Take 2 tablets (40 mEq total) by mouth daily. 28 tablet 0  . prochlorperazine (COMPAZINE) 10 MG tablet Take 1 tablet (10 mg total) by mouth every 6 (six) hours as needed (NAUSEA). 30 tablet 1  . traMADol (ULTRAM) 50 MG tablet Take 1 tablet (50 mg total) by mouth every 6 (six) hours as needed. 30 tablet 0  . loperamide (IMODIUM) 2 MG capsule Take 1 capsule (2 mg total) by mouth See admin instructions. With onset of loose stool, take '4mg'$  followed by '2mg'$  every 2 hours until 12 hours have  passed without loose bowel movement. Maximum: 16 mg/day (Patient not taking: Reported on 09/22/2018) 120 capsule 1   No current facility-administered medications for this visit.     PHYSICAL EXAMINATION: ECOG PERFORMANCE STATUS: 0 - Asymptomatic  BP 124/82 (BP Location: Left Arm)   Pulse (!) 112   Temp (!) 96 F (35.6 C) (Tympanic)   Ht '5\' 2"'$  (1.575 m)   Wt 139 lb 6.4 oz (63.2 kg)   BMI 25.50 kg/m   Filed Weights   09/22/18 0904  Weight: 139 lb 6.4 oz (63.2 kg)    Physical Exam  Constitutional: She is oriented to person, place, and time and well-developed, well-nourished, and in no distress.  Thin built female patient resting in a wheelchair.  She is alone.  HENT:  Head: Normocephalic and atraumatic.  Mouth/Throat: Oropharynx is clear and moist.  No oropharyngeal exudate.  Eyes: Pupils are equal, round, and reactive to light.  Neck: Normal range of motion. Neck supple.  Cardiovascular: Normal rate and regular rhythm.  Pulmonary/Chest: Breath sounds normal. No respiratory distress. She has no wheezes.  Abdominal: Soft. Bowel sounds are normal. She exhibits no distension and no mass. There is no abdominal tenderness. There is no rebound and no guarding.  Musculoskeletal: Normal range of motion.        General: No tenderness or edema.  Neurological: She is alert and oriented to person, place, and time.  Skin: Skin is warm.  Psychiatric: Affect normal.       LABORATORY DATA:  I have reviewed the data as listed    Component Value Date/Time   NA 132 (L) 09/22/2018 0816   NA 137 04/21/2018 1046   K 3.8 09/22/2018 0816   CL 102 09/22/2018 0816   CO2 24 09/22/2018 0816   GLUCOSE 109 (H) 09/22/2018 0816   BUN 5 (L) 09/22/2018 0816   BUN 23 04/21/2018 1046   CREATININE 0.67 09/22/2018 0816   CALCIUM 9.4 09/22/2018 0816   PROT 6.8 09/22/2018 0816   PROT 7.3 05/10/2018 1429   ALBUMIN 3.2 (L) 09/22/2018 0816   ALBUMIN 3.8 05/10/2018 1429   AST 32 09/22/2018 0816   ALT 15 09/22/2018 0816   ALKPHOS 173 (H) 09/22/2018 0816   BILITOT 0.5 09/22/2018 0816   BILITOT 6.2 (H) 05/10/2018 1429   GFRNONAA >60 09/22/2018 0816   GFRAA >60 09/22/2018 0816    No results found for: SPEP, UPEP  Lab Results  Component Value Date   WBC 12.3 (H) 09/22/2018   NEUTROABS 8.7 (H) 09/22/2018   HGB 8.8 (L) 09/22/2018   HCT 27.1 (L) 09/22/2018   MCV 82.1 09/22/2018   PLT 271 09/22/2018      Chemistry      Component Value Date/Time   NA 132 (L) 09/22/2018 0816   NA 137 04/21/2018 1046   K 3.8 09/22/2018 0816   CL 102 09/22/2018 0816   CO2 24 09/22/2018 0816   BUN 5 (L) 09/22/2018 0816   BUN 23 04/21/2018 1046   CREATININE 0.67 09/22/2018 0816      Component Value Date/Time   CALCIUM 9.4 09/22/2018 0816   ALKPHOS 173 (H) 09/22/2018  0816   AST 32 09/22/2018 0816   ALT 15 09/22/2018 0816   BILITOT 0.5 09/22/2018 0816   BILITOT 6.2 (H) 05/10/2018 1429       RADIOGRAPHIC STUDIES: I have personally reviewed the radiological images as listed and agreed with the findings in the report. No results found.   ASSESSMENT & PLAN:  Malignant neoplasm of head of pancreas [facility]) #Pancreatic adenocarcinoma/ pT2 pN1 M0, Stage IIB currently on adjuvant FOLFIRINOX.   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  #Proceed with FOLFIRINOX treatment #5 today. Labs today reviewed;  acceptable for treatment today.   # hyponatremia-improved 132.  # Hypomagnesemia-magnesium 2 stable.  # Elevated Alkaline phosphatase- sec to chemo vs. Other.  Check ca-19-9.   # Grade 1 diarrhea, stable.  Imodium as instructed.  #Chemotherapy-induced thrombocytopenia,stable Monitor.   #Chemotherapy-induced anemia.  Stable. Monitor.   #Peripheral neuropathy oxaliplatin grade 1 stable.  # DISPOSITION: # Chemo today; pump off/onpro Friday. # keep appt with Dr.Yu next week-cbc/bmp/mag- Wed/ IVFs over 1 hou Mag 2 gm/1 hour # in 2 weeks- MD/Dr.B- FOLFIRINOX;/possible Mag over 1 hour  Day-3 pump off/onpro-Dr.B    Orders Placed This Encounter  Procedures  . Cancer antigen 19-9    Standing Status:   Future    Standing Expiration Date:   10/27/2019   All questions were answered. The patient knows to call the clinic with any problems, questions or concerns.      Cammie Sickle, MD 09/22/2018 9:38 AM

## 2018-09-22 NOTE — Progress Notes (Signed)
Weight has decreased from 163lbs to 139lbs over treatment course. Spoke with MD. Orders given to decrease irinotecan dose to current calculated dose of 108mg  which was rounded to 100mg  for vial size. Leucovorin dose was decreased from 750mg  to 650mg  due to weight decrease as well.

## 2018-09-23 ENCOUNTER — Other Ambulatory Visit: Payer: Self-pay

## 2018-09-24 ENCOUNTER — Inpatient Hospital Stay: Payer: Medicare Other

## 2018-09-24 ENCOUNTER — Other Ambulatory Visit: Payer: Self-pay

## 2018-09-24 VITALS — BP 132/86 | HR 99 | Temp 97.1°F | Resp 18

## 2018-09-24 DIAGNOSIS — C250 Malignant neoplasm of head of pancreas: Secondary | ICD-10-CM

## 2018-09-24 DIAGNOSIS — C801 Malignant (primary) neoplasm, unspecified: Secondary | ICD-10-CM

## 2018-09-24 DIAGNOSIS — Z5111 Encounter for antineoplastic chemotherapy: Secondary | ICD-10-CM | POA: Diagnosis not present

## 2018-09-24 MED ORDER — PEGFILGRASTIM 6 MG/0.6ML ~~LOC~~ PSKT
6.0000 mg | PREFILLED_SYRINGE | Freq: Once | SUBCUTANEOUS | Status: AC
  Administered 2018-09-24: 16:00:00 6 mg via SUBCUTANEOUS
  Filled 2018-09-24: qty 0.6

## 2018-09-24 MED ORDER — SODIUM CHLORIDE 0.9% FLUSH
10.0000 mL | INTRAVENOUS | Status: DC | PRN
  Administered 2018-09-24: 10 mL
  Filled 2018-09-24: qty 10

## 2018-09-24 MED ORDER — SODIUM CHLORIDE 0.9 % IV SOLN
Freq: Once | INTRAVENOUS | Status: AC
  Administered 2018-09-24: 14:00:00 via INTRAVENOUS
  Filled 2018-09-24: qty 250

## 2018-09-24 MED ORDER — SODIUM CHLORIDE 0.9% FLUSH
10.0000 mL | INTRAVENOUS | Status: DC | PRN
  Administered 2018-09-24: 10 mL via INTRAVENOUS
  Filled 2018-09-24: qty 10

## 2018-09-24 MED ORDER — HEPARIN SOD (PORK) LOCK FLUSH 100 UNIT/ML IV SOLN
INTRAVENOUS | Status: AC
  Filled 2018-09-24: qty 5

## 2018-09-24 MED ORDER — HEPARIN SOD (PORK) LOCK FLUSH 100 UNIT/ML IV SOLN
500.0000 [IU] | Freq: Once | INTRAVENOUS | Status: AC | PRN
  Administered 2018-09-24: 500 [IU]
  Filled 2018-09-24: qty 5

## 2018-09-24 MED ORDER — HEPARIN SOD (PORK) LOCK FLUSH 100 UNIT/ML IV SOLN
500.0000 [IU] | Freq: Once | INTRAVENOUS | Status: AC
  Administered 2018-09-24: 500 [IU] via INTRAVENOUS

## 2018-09-24 NOTE — Progress Notes (Signed)
Per MD, Dr. Tasia Catchings, order: patient to receive IVF today, order present in treatment plan (1L 0.9% Sodium Chloride infusion at 9101ml/hr for one hour). Order released and administered at this time, see MAR.

## 2018-09-24 NOTE — Progress Notes (Signed)
14:04 - patient on schedule for d/c pump and Onpro only today, deaccessed patient's port for d/c pump

## 2018-09-27 ENCOUNTER — Other Ambulatory Visit: Payer: Self-pay | Admitting: Oncology

## 2018-09-27 NOTE — Telephone Encounter (Signed)
Last potassium 3.8.

## 2018-09-28 ENCOUNTER — Other Ambulatory Visit: Payer: Self-pay

## 2018-09-28 DIAGNOSIS — C250 Malignant neoplasm of head of pancreas: Secondary | ICD-10-CM

## 2018-09-29 ENCOUNTER — Telehealth: Payer: Self-pay | Admitting: *Deleted

## 2018-09-29 ENCOUNTER — Inpatient Hospital Stay: Payer: Medicare Other

## 2018-09-29 ENCOUNTER — Other Ambulatory Visit: Payer: Self-pay

## 2018-09-29 ENCOUNTER — Inpatient Hospital Stay (HOSPITAL_BASED_OUTPATIENT_CLINIC_OR_DEPARTMENT_OTHER): Payer: Medicare Other | Admitting: Oncology

## 2018-09-29 ENCOUNTER — Encounter: Payer: Self-pay | Admitting: Oncology

## 2018-09-29 VITALS — BP 98/62 | HR 115 | Temp 95.8°F | Resp 22 | Ht 62.0 in | Wt 143.0 lb

## 2018-09-29 VITALS — BP 125/73 | HR 85

## 2018-09-29 DIAGNOSIS — C250 Malignant neoplasm of head of pancreas: Secondary | ICD-10-CM

## 2018-09-29 DIAGNOSIS — Z87891 Personal history of nicotine dependence: Secondary | ICD-10-CM

## 2018-09-29 DIAGNOSIS — E871 Hypo-osmolality and hyponatremia: Secondary | ICD-10-CM | POA: Diagnosis not present

## 2018-09-29 DIAGNOSIS — E860 Dehydration: Secondary | ICD-10-CM

## 2018-09-29 DIAGNOSIS — R110 Nausea: Secondary | ICD-10-CM | POA: Diagnosis not present

## 2018-09-29 DIAGNOSIS — I10 Essential (primary) hypertension: Secondary | ICD-10-CM

## 2018-09-29 DIAGNOSIS — Z95828 Presence of other vascular implants and grafts: Secondary | ICD-10-CM

## 2018-09-29 DIAGNOSIS — Z79899 Other long term (current) drug therapy: Secondary | ICD-10-CM

## 2018-09-29 DIAGNOSIS — D6959 Other secondary thrombocytopenia: Secondary | ICD-10-CM

## 2018-09-29 DIAGNOSIS — D6481 Anemia due to antineoplastic chemotherapy: Secondary | ICD-10-CM

## 2018-09-29 DIAGNOSIS — Z5111 Encounter for antineoplastic chemotherapy: Secondary | ICD-10-CM | POA: Diagnosis not present

## 2018-09-29 DIAGNOSIS — Z90411 Acquired partial absence of pancreas: Secondary | ICD-10-CM

## 2018-09-29 DIAGNOSIS — E039 Hypothyroidism, unspecified: Secondary | ICD-10-CM

## 2018-09-29 DIAGNOSIS — G620 Drug-induced polyneuropathy: Secondary | ICD-10-CM

## 2018-09-29 LAB — BASIC METABOLIC PANEL
Anion gap: 5 (ref 5–15)
BUN: 9 mg/dL (ref 8–23)
CO2: 25 mmol/L (ref 22–32)
Calcium: 9.4 mg/dL (ref 8.9–10.3)
Chloride: 99 mmol/L (ref 98–111)
Creatinine, Ser: 0.71 mg/dL (ref 0.44–1.00)
GFR calc Af Amer: >60 mL/min (ref >60)
GFR calc non Af Amer: >60 mL/min (ref >60)
Glucose, Bld: 161 mg/dL — ABNORMAL HIGH (ref 70–99)
Potassium: 3.6 mmol/L (ref 3.5–5.1)
Sodium: 129 mmol/L — ABNORMAL LOW (ref 135–145)

## 2018-09-29 LAB — CBC WITH DIFFERENTIAL/PLATELET
Abs Immature Granulocytes: 0.24 10*3/uL — ABNORMAL HIGH (ref 0.00–0.07)
Basophils Absolute: 0.1 10*3/uL (ref 0.0–0.1)
Basophils Relative: 1 %
Eosinophils Absolute: 0 10*3/uL (ref 0.0–0.5)
Eosinophils Relative: 0 %
HCT: 26.5 % — ABNORMAL LOW (ref 36.0–46.0)
Hemoglobin: 8.4 g/dL — ABNORMAL LOW (ref 12.0–15.0)
Immature Granulocytes: 2 %
Lymphocytes Relative: 12 %
Lymphs Abs: 1.2 10*3/uL (ref 0.7–4.0)
MCH: 26.7 pg (ref 26.0–34.0)
MCHC: 31.7 g/dL (ref 30.0–36.0)
MCV: 84.1 fL (ref 80.0–100.0)
Monocytes Absolute: 0.6 10*3/uL (ref 0.1–1.0)
Monocytes Relative: 6 %
Neutro Abs: 8.1 10*3/uL — ABNORMAL HIGH (ref 1.7–7.7)
Neutrophils Relative %: 79 %
Platelets: 77 10*3/uL — ABNORMAL LOW (ref 150–400)
RBC: 3.15 MIL/uL — ABNORMAL LOW (ref 3.87–5.11)
RDW: 23.3 % — ABNORMAL HIGH (ref 11.5–15.5)
Smear Review: DECREASED
WBC: 10.2 10*3/uL (ref 4.0–10.5)
nRBC: 0 % (ref 0.0–0.2)

## 2018-09-29 LAB — GLUCOSE, CAPILLARY: Glucose-Capillary: 142 mg/dL — ABNORMAL HIGH (ref 70–99)

## 2018-09-29 LAB — MAGNESIUM: Magnesium: 2.1 mg/dL (ref 1.7–2.4)

## 2018-09-29 MED ORDER — ONDANSETRON HCL 4 MG/2ML IJ SOLN
8.0000 mg | Freq: Once | INTRAMUSCULAR | Status: AC
  Administered 2018-09-29: 8 mg via INTRAVENOUS
  Filled 2018-09-29: qty 4

## 2018-09-29 MED ORDER — PROCHLORPERAZINE MALEATE 10 MG PO TABS: 10.0000 mg | ORAL_TABLET | Freq: Four times a day (QID) | ORAL | 1 refills | Status: DC | PRN

## 2018-09-29 MED ORDER — SODIUM CHLORIDE 0.9 % IV SOLN
Freq: Once | INTRAVENOUS | Status: AC
  Administered 2018-09-29: 10:00:00 via INTRAVENOUS
  Filled 2018-09-29: qty 250

## 2018-09-29 MED ORDER — LIDOCAINE-PRILOCAINE 2.5-2.5 % EX CREA: 1.0000 "application " | TOPICAL_CREAM | CUTANEOUS | 3 refills | Status: DC | PRN

## 2018-09-29 MED ORDER — MEGESTROL ACETATE 40 MG PO TABS: 40.0000 mg | ORAL_TABLET | Freq: Four times a day (QID) | ORAL | 0 refills | Status: DC

## 2018-09-29 MED ORDER — HEPARIN SOD (PORK) LOCK FLUSH 100 UNIT/ML IV SOLN
500.0000 [IU] | Freq: Once | INTRAVENOUS | Status: AC
  Administered 2018-09-29: 500 [IU]
  Filled 2018-09-29: qty 5

## 2018-09-29 MED ORDER — SODIUM CHLORIDE 0.9% FLUSH
10.0000 mL | Freq: Once | INTRAVENOUS | Status: AC
  Administered 2018-09-29: 10 mL via INTRAVENOUS
  Filled 2018-09-29: qty 10

## 2018-09-29 NOTE — Progress Notes (Signed)
V/o Dr. Tasia Catchings to obtain POCT-glucose. Reading 146   S/p 1 liter of IV fluids. Pt reports that her "diizziness has resolved." She is feeling much better."   Port a cath flushed. Patient will return tomorrow. Port needle left accessed. Pt's port is Heparin locked.

## 2018-09-29 NOTE — Telephone Encounter (Signed)
I am sending megace and comapzine now.

## 2018-09-29 NOTE — Telephone Encounter (Signed)
Patient went to pick up prescriptions and there was no nausea or appetite medications there. Please advise

## 2018-09-29 NOTE — Progress Notes (Signed)
Hematology/Oncology follow-up note [facility] Telephone:([phone] [phone]   Patient Care Team: Jodelle Green, FNP as PCP - General (Family Medicine) Clent Jacks, RN as Registered Nurse  REFERRING PROVIDER: Evie Lacks CHIEF COMPLAINTS/REASON FOR VISIT:  Follow-up for management of pancreatic cancer.  HISTORY OF PRESENTING ILLNESS:  Lynn Taylor is a  73 y.o.  female with PMH listed below who was referred to me for evaluation of evaluation of elevated ferritin. Patient recently had lab work-up done on 04/09/2018 which showed ferritin level 1877, iron 97, TIBC 340, iron saturation 29, 04/11/2018 CBC showed hemoglobin 13.2, MCV 81, WBC 7.6, platelet counts 246,000. Patient was referred to hematology for further evaluation of elevated ferritin.  #04/28/2018 Ultrasound of liver was obtained which showed CBD and intrahepatic biliary dilatation Patient wa she is s advised to go to emergency room for further evaluation. Also had hyperbilirubinemia.  Patient was complaining about being jaundiced, pruritus all over. MR abdomen MRCP with and without contrast showed market biliary duct dilatation, secondary to obstruction in the region of pancreatic head.  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 No abdominal adenopathy, liver metastasis or cross vascular involvement.  patient was evaluated by gastroenterology and status post ERCP with stenting. CA 19.9 1173 CEA 3.9 # 05/25/2018  patient was referred to [facility] and s/p whipple resection.Her postop course was c/b Type A pancreatic leak and c.diff. Pathology showed A. Hepatic artery lymph node, excision: One lymph node, negative for malignancy (0/1). B. Biliary stent, removal: Medical device, consistent with stent, gross examination only. C. 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 All margins are negative (closest margin=uncinate, 0.5 mm) Metastatic adenocarcinoma in one of thirty-two lymph nodes (1/32).  Portion of stomach and duodenum with no specific pathologic diagnosis.   Grade, 3 poorly differentiated.  Pathologic pT2 pN1  Cancer Treatment 07/13/2018 Cycle 1 FOLFIRINOX with Onpro Neulasta.  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 INTERVAL HISTORY Lynn Taylor is a 73 y.o. female who has above history reviewed by me today presents for follow up visit for evaluation of tolerability of cycle 5 modified FOLFIRINOX.  Patient reports feeling dizzy today, about to pass out.  She has some nausea this morning, did not vomit.  Denies any diarrhea. She takes Slow-Mag 2 tablets daily.  Denies any abdominal pain, fever or chills. Appetite is very poor, not eating very well.     Review of Systems  Constitutional: Positive for fatigue. Negative for appetite change, chills and fever.  HENT:   Negative for hearing loss and voice change.   Eyes: Negative for eye problems.  Respiratory: Negative for chest tightness and cough.   Cardiovascular: Negative for chest pain.  Gastrointestinal: Positive for diarrhea. Negative for abdominal distention, abdominal pain, blood in stool and nausea.  Endocrine: Negative for hot flashes.  Genitourinary: Negative for difficulty urinating, frequency and hematuria.    Musculoskeletal: Negative for arthralgias.  Skin: Negative for itching and rash.  Neurological:  Negative for extremity weakness.  Hematological: Negative for adenopathy. Does not bruise/bleed easily.  Psychiatric/Behavioral: Negative for confusion.     MEDICAL HISTORY:  Past Medical History:  Diagnosis Date  . Cancer ([place])   . GERD (gastroesophageal reflux disease)   . Hypertension   . Hypothyroidism   . Malignant neoplasm of head of pancreas ([place]) 06/08/2018    SURGICAL HISTORY: Past Surgical History:  Procedure Laterality Date  . CHOLECYSTECTOMY    . COLONOSCOPY N/A 12/02/2016   Procedure: COLONOSCOPY;  Surgeon: Lollie Sails, MD;  Location: [facility] ENDOSCOPY;  Service: Endoscopy;  Laterality: N/A;  . ERCP N/A 04/30/2018   Procedure: ENDOSCOPIC RETROGRADE CHOLANGIOPANCREATOGRAPHY (ERCP);  Surgeon: Lucilla Lame, MD;  Location: [facility] ENDOSCOPY;  Service: Endoscopy;  Laterality: N/A;  . ERCP N/A 05/05/2018   Procedure: ENDOSCOPIC RETROGRADE CHOLANGIOPANCREATOGRAPHY (ERCP);  Surgeon: Lucilla Lame, MD;  Location: [facility] ENDOSCOPY;  Service: Endoscopy;  Laterality: N/A;  . PORTA CATH INSERTION N/A 06/28/2018   Procedure: PORTA CATH INSERTION;  Surgeon: Algernon Huxley, MD;  Location: [facility];  Service: Cardiovascular;  Laterality: N/A;    SOCIAL HISTORY: Social History   Socioeconomic History  . Marital status: Married    Spouse name: Not on file  . Number of children: Not on file  . Years of education: Not on file  . Highest education level: Not on file  Occupational History  . Not on file  Social Needs  . Financial resource strain: Not on file  . Food insecurity:    Worry: Not on file    Inability: Not on file  . Transportation needs:    Medical: Not on file    Non-medical: Not on file  Tobacco Use  . Smoking status: Former Smoker    Last attempt to quit: 04/14/1988    Years since quitting: 30.4  . Smokeless tobacco: Never Used  Substance and Sexual  Activity  . Alcohol use: Yes    Comment: social drinker  . Drug use: No  . Sexual activity: Not Currently  Lifestyle  . Physical activity:    Days per week: Not on file    Minutes per session: Not on file  . Stress: Not on file  Relationships  . Social connections:    Talks on phone: Not on file    Gets together: Not on file    Attends religious service: Not on file    Active member of club or organization: Not on file    Attends meetings of clubs or organizations: Not on file    Relationship status: Not on file  . Intimate partner violence:    Fear of current or ex partner: Not on file    Emotionally abused: Not on file    Physically abused: Not on file    Forced sexual activity: Not on file  Other Topics Concern  . Not on file  Social History Narrative  . Not on file    FAMILY HISTORY: Family History  Problem Relation Age of Onset  . Diabetes Mellitus I Other   . Alcoholism Other   . Hypertension Other   . Hyperlipidemia Other   . Coronary artery disease Other   . Stroke Other   . Osteoarthritis Other   . Migraines Other   . Heart Problems Mother   . Stroke Sister   . CAD Brother   . Stroke Brother   . Breast cancer Neg Hx     ALLERGIES:  has No Known Allergies.  MEDICATIONS:  Current Outpatient Medications  Medication Sig Dispense Refill  . Cholecalciferol (VITAMIN D-3) 25 MCG (1000 UT) CAPS Take by mouth.    . levothyroxine (SYNTHROID, LEVOTHROID) 100 MCG tablet Take 1 tablet (100 mcg total) by mouth daily before breakfast. 90 tablet 4  . Magnesium Cl-Calcium Carbonate (SLOW-MAG PO) Take 2 tablets by mouth daily.    Marland Kitchen omeprazole (PRILOSEC) 20 MG capsule Take 1 capsule (20 mg total) by mouth daily. 30 capsule 2  . ondansetron (ZOFRAN) 4 MG tablet Take 1 tablet (4 mg total) by mouth every 6 (six) hours as needed for nausea or vomiting. 30 tablet 3  . potassium chloride SA (K-DUR) 20 MEQ tablet TAKE 2 TABLETS BY MOUTH DAILY 28 tablet 0  . prochlorperazine  (COMPAZINE) 10 MG tablet Take 1 tablet (10 mg total) by mouth every 6 (six) hours as needed (NAUSEA). 30 tablet 1  . chlorhexidine (PERIDEX) 0.12 % solution Use as directed 15 mLs in the mouth or throat 2 (two) times daily. (Patient not taking: Reported on 09/29/2018) 473 mL 3  . lidocaine-prilocaine (EMLA) cream Apply 1 application topically as needed. 30 g 3  . loperamide (IMODIUM) 2 MG capsule Take 1 capsule (2 mg total) by mouth See admin instructions. With onset of loose stool, take 4mg  followed by 2mg  every 2 hours until 12 hours have passed without loose bowel movement. Maximum: 16 mg/day (Patient not taking: Reported on 09/22/2018) 120 capsule 1  . megestrol (MEGACE) 40 MG tablet Take 1 tablet (40 mg total) by mouth 4 (four) times daily. 120 tablet 0  . traMADol (ULTRAM) 50 MG tablet Take 1 tablet (50 mg total) by mouth every 6 (six) hours as needed. (Patient not taking: Reported on 09/29/2018) 30 tablet 0   No current facility-administered medications for this visit.      PHYSICAL EXAMINATION: ECOG PERFORMANCE STATUS: 1 - Symptomatic but completely ambulatory Vitals:   09/29/18 0908  BP: 98/62  Pulse: (!) 115  Resp: (!) 22  Temp: (!) 95.8 F (35.4 C)   Filed Weights   09/29/18 0908  Weight: 143 lb (64.9 kg)    Physical Exam Constitutional:      General: She is not in acute distress. HENT:     Head: Normocephalic and atraumatic.     Mouth/Throat:     Mouth: Mucous membranes are dry.     Comments: No thrush, dry oral mucosa.  Eyes:     General: No scleral icterus.    Pupils: Pupils are equal, round, and reactive to light.  Neck:     Musculoskeletal: Normal range of motion and neck supple.  Cardiovascular:     Rate and Rhythm: Regular rhythm.     Heart sounds: Normal heart sounds.     Comments: Tachycardic Pulmonary:     Effort: Pulmonary effort is normal. No respiratory distress.     Breath sounds: No wheezing.  Abdominal:     General: Bowel sounds are normal. There  is no distension.     Palpations: Abdomen is soft. There is no mass.     Tenderness: There is no abdominal tenderness.  Musculoskeletal: Normal range of motion.        General: No deformity.  Skin:    General: Skin is warm and dry.     Findings: No erythema or rash.  Neurological:     Mental Status: She is alert and oriented to person, place, and time.     Cranial Nerves: No cranial nerve deficit.  Coordination: Coordination normal.  Psychiatric:        Behavior: Behavior normal.        Thought Content: Thought content normal.      LABORATORY DATA:  I have reviewed the data as listed Lab Results  Component Value Date   WBC 10.2 09/29/2018   HGB 8.4 (L) 09/29/2018   HCT 26.5 (L) 09/29/2018   MCV 84.1 09/29/2018   PLT 77 (L) 09/29/2018   Recent Labs    04/21/18 1046  04/29/18 0612  05/10/18 1429  09/15/18 0850  09/17/18 1255 09/22/18 0816 09/29/18 0844  NA 137   < > 136   < >  --    < > 126*   < > 129* 132* 129*  K 3.8   < > 3.3*   < >  --    < > 3.6   < > 3.2* 3.8 3.6  CL 100   < > 108   < >  --    < > 95*   < > 98 102 99  CO2 22   < > 23   < >  --    < > 22   < > 23 24 25   GLUCOSE 95   < > 101*   < >  --    < > 113*   < > 101* 109* 161*  BUN 23   < > 9   < >  --    < > 16   < > 12 5* 9  CREATININE 1.11*   < > 0.49   < >  --    < > 0.95   < > 0.70 0.67 0.71  CALCIUM 10.6*   < > 9.2   < >  --    < > 9.3   < > 8.8* 9.4 9.4  GFRNONAA 50*   < > >60   < >  --    < > 60*   < > >60 >60 >60  GFRAA 57*   < > >60   < >  --    < > >60   < > >60 >60 >60  PROT 7.7   < >  --    < > 7.3   < > 6.6  --  6.4* 6.8  --   ALBUMIN 3.9   < >  --    < > 3.8   < > 3.3*  --  3.2* 3.2*  --   AST 61*   < >  --    < > 94*   < > 30  --  30 32  --   ALT 121*   < >  --    < > 80*   < > 14  --  15 15  --   ALKPHOS 275*   < >  --    < > 200*   < > 146*  --  136* 173*  --   BILITOT 11.7*   < > 13.7*   < > 6.2*   < > 0.8  --  0.7 0.5  --   BILIDIR 8.84*  --  8.2*  --  4.58*  --   --   --   --    --   --    < > = values in this interval not displayed.   Iron/TIBC/Ferritin/ %Sat    Component Value Date/Time   IRON 77 05/04/2018 0831   IRON 97 04/09/2018 1440  TIBC 320 05/04/2018 0831   TIBC 340 04/09/2018 1440   FERRITIN 496 (H) 05/04/2018 0831   FERRITIN 1,877 (H) 04/09/2018 1440   IRONPCTSAT 24 05/04/2018 0831   IRONPCTSAT 29 04/09/2018 1440     post op CA 19.9 is 21.    ASSESSMENT & PLAN:  1. Dehydration   2. Malignant neoplasm of head of pancreas ([place])   3. Nausea without vomiting   4. Hyponatremia    # pT2 pN1 M0, Stage IIB pancreatic cancer S/p whipple resection.  Status post 5 cycles of FOLFIRINOX She has had GI toxicities and hematological toxicities after cycle 1 FOLFIRINOX. 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  Patient saw Dr. Imelda Pillow last week and had cycle 5 modified FOLFIRINOX treatment.  50% dose reduction of Irinotecan and omitted bolus 5-FU.  Labs are reviewed and discussed with patient. Dizziness, tachycardia-likely secondary to hypovolemia due to poor oral intake and dehydration. Proceed with 1 L of IV fluid normal saline today. She also feels nausea, will proceed with 8 mg of Zofran IV x1. Patient has tried to oral Zofran and does not feel helpful.  Advised patient to use Compazine as instructed. Recommend patient to start appetite stimulator Megace 40 mg twice daily, if not helpful she can further titrate up to 40 mg 4 times daily Continue following up with dietitian  -I evaluate the patient after she receives IV fluid and Zofran.  Subjectively feels a lot better.  Hyponatremia, secondary to poor oral intake.  We will proceed with daily IV hydration with 1 L of  normal saline x 3. Repeat labs daily.  Refer to palliative care for evaluation.  Hypomagnesia, magnesium level stable.  Continue oral slow mag 2 tablets daily.   We spent sufficient time to discuss many aspect of care, questions were answered to patient's satisfaction.  Return to clinic with lab BMP, daily x 2,+/- IV fluid  Labs MD- assessment-next cycle of FOLFIRINOX with Dr. Jacinto Reap , +/- IV fluid and Magnesium in 1 week.  Earlie Server, MD, PhD Hematology Oncology [facility] Pager- [phone] 09/29/2018

## 2018-09-30 ENCOUNTER — Inpatient Hospital Stay: Payer: Medicare Other

## 2018-09-30 ENCOUNTER — Other Ambulatory Visit: Payer: Self-pay

## 2018-09-30 VITALS — BP 122/81 | HR 97 | Temp 96.6°F | Resp 20

## 2018-09-30 DIAGNOSIS — E871 Hypo-osmolality and hyponatremia: Secondary | ICD-10-CM

## 2018-09-30 DIAGNOSIS — C801 Malignant (primary) neoplasm, unspecified: Secondary | ICD-10-CM

## 2018-09-30 DIAGNOSIS — Z5111 Encounter for antineoplastic chemotherapy: Secondary | ICD-10-CM | POA: Diagnosis not present

## 2018-09-30 DIAGNOSIS — E860 Dehydration: Secondary | ICD-10-CM

## 2018-09-30 DIAGNOSIS — Z95828 Presence of other vascular implants and grafts: Secondary | ICD-10-CM

## 2018-09-30 LAB — BASIC METABOLIC PANEL
Anion gap: 7 (ref 5–15)
BUN: 6 mg/dL — ABNORMAL LOW (ref 8–23)
CO2: 22 mmol/L (ref 22–32)
Calcium: 9.1 mg/dL (ref 8.9–10.3)
Chloride: 101 mmol/L (ref 98–111)
Creatinine, Ser: 0.69 mg/dL (ref 0.44–1.00)
GFR calc Af Amer: >60 mL/min (ref >60)
GFR calc non Af Amer: >60 mL/min (ref >60)
Glucose, Bld: 131 mg/dL — ABNORMAL HIGH (ref 70–99)
Potassium: 3.4 mmol/L — ABNORMAL LOW (ref 3.5–5.1)
Sodium: 130 mmol/L — ABNORMAL LOW (ref 135–145)

## 2018-09-30 LAB — CANCER ANTIGEN 19-9: CA 19-9: 29 U/mL (ref 0–35)

## 2018-09-30 MED ORDER — SODIUM CHLORIDE 0.9 % IV SOLN
Freq: Once | INTRAVENOUS | Status: AC
  Administered 2018-09-30: 10:00:00 via INTRAVENOUS
  Filled 2018-09-30: qty 250

## 2018-09-30 MED ORDER — HEPARIN SOD (PORK) LOCK FLUSH 100 UNIT/ML IV SOLN
500.0000 [IU] | Freq: Once | INTRAVENOUS | Status: AC
  Administered 2018-09-30: 500 [IU] via INTRAVENOUS

## 2018-09-30 MED ORDER — HEPARIN SOD (PORK) LOCK FLUSH 100 UNIT/ML IV SOLN
INTRAVENOUS | Status: AC
  Filled 2018-09-30: qty 5

## 2018-09-30 MED ORDER — SODIUM CHLORIDE 0.9% FLUSH
10.0000 mL | Freq: Once | INTRAVENOUS | Status: AC
  Administered 2018-09-30: 10 mL via INTRAVENOUS
  Filled 2018-09-30: qty 10

## 2018-10-01 ENCOUNTER — Inpatient Hospital Stay: Payer: Medicare Other

## 2018-10-01 ENCOUNTER — Other Ambulatory Visit: Payer: Self-pay

## 2018-10-01 VITALS — BP 123/74 | HR 98 | Temp 96.3°F | Resp 18

## 2018-10-01 DIAGNOSIS — E860 Dehydration: Secondary | ICD-10-CM

## 2018-10-01 DIAGNOSIS — Z5111 Encounter for antineoplastic chemotherapy: Secondary | ICD-10-CM | POA: Diagnosis not present

## 2018-10-01 DIAGNOSIS — E871 Hypo-osmolality and hyponatremia: Secondary | ICD-10-CM

## 2018-10-01 LAB — BASIC METABOLIC PANEL
Anion gap: 8 (ref 5–15)
BUN: 5 mg/dL — ABNORMAL LOW (ref 8–23)
CO2: 21 mmol/L — ABNORMAL LOW (ref 22–32)
Calcium: 9.3 mg/dL (ref 8.9–10.3)
Chloride: 102 mmol/L (ref 98–111)
Creatinine, Ser: 0.63 mg/dL (ref 0.44–1.00)
GFR calc Af Amer: >60 mL/min (ref >60)
GFR calc non Af Amer: >60 mL/min (ref >60)
Glucose, Bld: 131 mg/dL — ABNORMAL HIGH (ref 70–99)
Potassium: 3 mmol/L — ABNORMAL LOW (ref 3.5–5.1)
Sodium: 131 mmol/L — ABNORMAL LOW (ref 135–145)

## 2018-10-01 MED ORDER — SODIUM CHLORIDE 0.9 % IV SOLN
Freq: Once | INTRAVENOUS | Status: AC
  Administered 2018-10-01: 1000 mL via INTRAVENOUS
  Filled 2018-10-01: qty 250

## 2018-10-01 MED ORDER — HEPARIN SOD (PORK) LOCK FLUSH 100 UNIT/ML IV SOLN
500.0000 [IU] | Freq: Once | INTRAVENOUS | Status: AC
  Administered 2018-10-01: 500 [IU] via INTRAVENOUS
  Filled 2018-10-01: qty 5

## 2018-10-01 MED ORDER — POTASSIUM CHLORIDE 20 MEQ/100ML IV SOLN
20.0000 meq | Freq: Once | INTRAVENOUS | Status: AC
  Administered 2018-10-01: 20 meq via INTRAVENOUS

## 2018-10-01 MED ORDER — SODIUM CHLORIDE 0.9% FLUSH
10.0000 mL | Freq: Once | INTRAVENOUS | Status: AC
  Administered 2018-10-01: 09:00:00 10 mL via INTRAVENOUS
  Filled 2018-10-01: qty 10

## 2018-10-01 NOTE — Progress Notes (Signed)
Lab results reviewed with MD, Dr. Tasia Catchings. Per MD order: release orders for 0.9% Sodium Chloride infusion 1L at 93ml/hr and Potassium Chloride 20 mEq infusion over 1 hour and administer today.

## 2018-10-04 ENCOUNTER — Telehealth: Payer: Self-pay

## 2018-10-04 ENCOUNTER — Other Ambulatory Visit: Payer: Self-pay

## 2018-10-04 ENCOUNTER — Inpatient Hospital Stay: Payer: Medicare Other

## 2018-10-04 ENCOUNTER — Other Ambulatory Visit: Payer: Self-pay | Admitting: Oncology

## 2018-10-04 DIAGNOSIS — E876 Hypokalemia: Secondary | ICD-10-CM

## 2018-10-04 DIAGNOSIS — Z5111 Encounter for antineoplastic chemotherapy: Secondary | ICD-10-CM | POA: Diagnosis not present

## 2018-10-04 DIAGNOSIS — Z95828 Presence of other vascular implants and grafts: Secondary | ICD-10-CM

## 2018-10-04 DIAGNOSIS — C250 Malignant neoplasm of head of pancreas: Secondary | ICD-10-CM

## 2018-10-04 DIAGNOSIS — E860 Dehydration: Secondary | ICD-10-CM

## 2018-10-04 LAB — COMPREHENSIVE METABOLIC PANEL
ALT: 13 U/L (ref 0–44)
AST: 24 U/L (ref 15–41)
Albumin: 3.5 g/dL (ref 3.5–5.0)
Alkaline Phosphatase: 134 U/L — ABNORMAL HIGH (ref 38–126)
Anion gap: 11 (ref 5–15)
BUN: <5 mg/dL — ABNORMAL LOW (ref 8–23)
CO2: 24 mmol/L (ref 22–32)
Calcium: 10.2 mg/dL (ref 8.9–10.3)
Chloride: 98 mmol/L (ref 98–111)
Creatinine, Ser: 0.64 mg/dL (ref 0.44–1.00)
GFR calc Af Amer: >60 mL/min (ref >60)
GFR calc non Af Amer: >60 mL/min (ref >60)
Glucose, Bld: 109 mg/dL — ABNORMAL HIGH (ref 70–99)
Potassium: 3.3 mmol/L — ABNORMAL LOW (ref 3.5–5.1)
Sodium: 133 mmol/L — ABNORMAL LOW (ref 135–145)
Total Bilirubin: 0.6 mg/dL (ref 0.3–1.2)
Total Protein: 7.2 g/dL (ref 6.5–8.1)

## 2018-10-04 LAB — MAGNESIUM: Magnesium: 1.8 mg/dL (ref 1.7–2.4)

## 2018-10-04 LAB — CBC WITH DIFFERENTIAL/PLATELET
Abs Immature Granulocytes: 0.56 10*3/uL — ABNORMAL HIGH (ref 0.00–0.07)
Basophils Absolute: 0.1 10*3/uL (ref 0.0–0.1)
Basophils Relative: 1 %
Eosinophils Absolute: 0 10*3/uL (ref 0.0–0.5)
Eosinophils Relative: 0 %
HCT: 28.8 % — ABNORMAL LOW (ref 36.0–46.0)
Hemoglobin: 9.1 g/dL — ABNORMAL LOW (ref 12.0–15.0)
Immature Granulocytes: 5 %
Lymphocytes Relative: 20 %
Lymphs Abs: 2.2 10*3/uL (ref 0.7–4.0)
MCH: 27.3 pg (ref 26.0–34.0)
MCHC: 31.6 g/dL (ref 30.0–36.0)
MCV: 86.5 fL (ref 80.0–100.0)
Monocytes Absolute: 1.1 10*3/uL — ABNORMAL HIGH (ref 0.1–1.0)
Monocytes Relative: 10 %
Neutro Abs: 6.9 10*3/uL (ref 1.7–7.7)
Neutrophils Relative %: 64 %
Platelets: 149 10*3/uL — ABNORMAL LOW (ref 150–400)
RBC: 3.33 MIL/uL — ABNORMAL LOW (ref 3.87–5.11)
RDW: 25.2 % — ABNORMAL HIGH (ref 11.5–15.5)
Smear Review: ADEQUATE
WBC: 10.8 10*3/uL — ABNORMAL HIGH (ref 4.0–10.5)
nRBC: 0.6 % — ABNORMAL HIGH (ref 0.0–0.2)

## 2018-10-04 MED ORDER — HEPARIN SOD (PORK) LOCK FLUSH 100 UNIT/ML IV SOLN
INTRAVENOUS | Status: AC
  Filled 2018-10-04: qty 5

## 2018-10-04 MED ORDER — HEPARIN SOD (PORK) LOCK FLUSH 100 UNIT/ML IV SOLN
500.0000 [IU] | Freq: Once | INTRAVENOUS | Status: AC
  Administered 2018-10-04: 500 [IU] via INTRAVENOUS

## 2018-10-04 MED ORDER — SODIUM CHLORIDE 0.9% FLUSH
10.0000 mL | Freq: Once | INTRAVENOUS | Status: AC
  Administered 2018-10-04: 10 mL via INTRAVENOUS
  Filled 2018-10-04: qty 10

## 2018-10-04 MED ORDER — SODIUM CHLORIDE 0.9 % IV SOLN
Freq: Once | INTRAVENOUS | Status: AC
  Administered 2018-10-04: 11:00:00 via INTRAVENOUS
  Filled 2018-10-04: qty 1000

## 2018-10-04 MED ORDER — SODIUM CHLORIDE 0.9 % IV SOLN
Freq: Once | INTRAVENOUS | Status: AC
  Administered 2018-10-04: 11:00:00 via INTRAVENOUS
  Filled 2018-10-04: qty 250

## 2018-10-04 NOTE — Progress Notes (Signed)
Nutrition Follow-up:  RD working remotely.  Patient with pancreatic cancer, s/p whipple.  Patient receiving chemotherapy.    Called patient for nutrition follow-up.  Patient reports that she continues with no taste and no appetite.  "I can't taste anything and it is really hard to eat when you can't taste."  Reports has only had grits so far today.  Came to clinic earlier today for IV fluids.  Reports that she is better after receiving fluids.  Reports that she tried one of the shakes and had diarrhea afterwards.     Medications: megace and compazine added.   Labs: reviewed  Anthropometrics:   Weight noted 143 lb on 4/22 decreased from 144 lb on 4/13.    NUTRITION DIAGNOSIS: Inadequate oral intake continues    INTERVENTION:  Reviewed strategies to help with taste changes Encouraged added calories and protein in foods that she is able to eat.   Will provide samples of "clear" shakes for patient to try. She will pick up at 4/29 visit    MONITORING, EVALUATION, GOAL: Patient will consume adequate calories and protein to maintain weight   NEXT VISIT: Monday, May 18, phone visit  Tramaine Sauls B. Zenia Resides, [place], [place] Registered Dietitian [phone] (pager)

## 2018-10-04 NOTE — Telephone Encounter (Signed)
Open in error

## 2018-10-06 ENCOUNTER — Inpatient Hospital Stay: Payer: Medicare Other

## 2018-10-06 ENCOUNTER — Other Ambulatory Visit: Payer: Self-pay

## 2018-10-06 ENCOUNTER — Inpatient Hospital Stay (HOSPITAL_BASED_OUTPATIENT_CLINIC_OR_DEPARTMENT_OTHER): Payer: Medicare Other | Admitting: Hospice and Palliative Medicine

## 2018-10-06 ENCOUNTER — Inpatient Hospital Stay (HOSPITAL_BASED_OUTPATIENT_CLINIC_OR_DEPARTMENT_OTHER): Payer: Medicare Other | Admitting: Internal Medicine

## 2018-10-06 VITALS — HR 90

## 2018-10-06 VITALS — BP 126/83 | HR 108 | Temp 97.5°F | Resp 16 | Ht 62.0 in | Wt 137.7 lb

## 2018-10-06 DIAGNOSIS — Z87891 Personal history of nicotine dependence: Secondary | ICD-10-CM

## 2018-10-06 DIAGNOSIS — K521 Toxic gastroenteritis and colitis: Secondary | ICD-10-CM

## 2018-10-06 DIAGNOSIS — Z5111 Encounter for antineoplastic chemotherapy: Secondary | ICD-10-CM | POA: Diagnosis not present

## 2018-10-06 DIAGNOSIS — R7989 Other specified abnormal findings of blood chemistry: Secondary | ICD-10-CM | POA: Diagnosis not present

## 2018-10-06 DIAGNOSIS — E871 Hypo-osmolality and hyponatremia: Secondary | ICD-10-CM | POA: Diagnosis not present

## 2018-10-06 DIAGNOSIS — E039 Hypothyroidism, unspecified: Secondary | ICD-10-CM

## 2018-10-06 DIAGNOSIS — C250 Malignant neoplasm of head of pancreas: Secondary | ICD-10-CM

## 2018-10-06 DIAGNOSIS — Z79899 Other long term (current) drug therapy: Secondary | ICD-10-CM

## 2018-10-06 DIAGNOSIS — T451X5A Adverse effect of antineoplastic and immunosuppressive drugs, initial encounter: Secondary | ICD-10-CM

## 2018-10-06 DIAGNOSIS — D6481 Anemia due to antineoplastic chemotherapy: Secondary | ICD-10-CM

## 2018-10-06 DIAGNOSIS — R531 Weakness: Secondary | ICD-10-CM

## 2018-10-06 DIAGNOSIS — E46 Unspecified protein-calorie malnutrition: Secondary | ICD-10-CM

## 2018-10-06 DIAGNOSIS — G620 Drug-induced polyneuropathy: Secondary | ICD-10-CM

## 2018-10-06 DIAGNOSIS — D696 Thrombocytopenia, unspecified: Secondary | ICD-10-CM

## 2018-10-06 DIAGNOSIS — E860 Dehydration: Secondary | ICD-10-CM | POA: Diagnosis not present

## 2018-10-06 DIAGNOSIS — Z90411 Acquired partial absence of pancreas: Secondary | ICD-10-CM

## 2018-10-06 DIAGNOSIS — Z515 Encounter for palliative care: Secondary | ICD-10-CM

## 2018-10-06 DIAGNOSIS — K219 Gastro-esophageal reflux disease without esophagitis: Secondary | ICD-10-CM

## 2018-10-06 DIAGNOSIS — I10 Essential (primary) hypertension: Secondary | ICD-10-CM

## 2018-10-06 DIAGNOSIS — D6959 Other secondary thrombocytopenia: Secondary | ICD-10-CM

## 2018-10-06 LAB — COMPREHENSIVE METABOLIC PANEL
ALT: 12 U/L (ref 0–44)
AST: 23 U/L (ref 15–41)
Albumin: 3.3 g/dL — ABNORMAL LOW (ref 3.5–5.0)
Alkaline Phosphatase: 121 U/L (ref 38–126)
Anion gap: 9 (ref 5–15)
BUN: 13 mg/dL (ref 8–23)
CO2: 23 mmol/L (ref 22–32)
Calcium: 9.7 mg/dL (ref 8.9–10.3)
Chloride: 102 mmol/L (ref 98–111)
Creatinine, Ser: 0.83 mg/dL (ref 0.44–1.00)
GFR calc Af Amer: >60 mL/min (ref >60)
GFR calc non Af Amer: >60 mL/min (ref >60)
Glucose, Bld: 106 mg/dL — ABNORMAL HIGH (ref 70–99)
Potassium: 4.2 mmol/L (ref 3.5–5.1)
Sodium: 134 mmol/L — ABNORMAL LOW (ref 135–145)
Total Bilirubin: 0.5 mg/dL (ref 0.3–1.2)
Total Protein: 6.8 g/dL (ref 6.5–8.1)

## 2018-10-06 LAB — CBC WITH DIFFERENTIAL/PLATELET
Abs Immature Granulocytes: 0.69 10*3/uL — ABNORMAL HIGH (ref 0.00–0.07)
Basophils Absolute: 0.1 10*3/uL (ref 0.0–0.1)
Basophils Relative: 1 %
Eosinophils Absolute: 0 10*3/uL (ref 0.0–0.5)
Eosinophils Relative: 0 %
HCT: 26.7 % — ABNORMAL LOW (ref 36.0–46.0)
Hemoglobin: 8.5 g/dL — ABNORMAL LOW (ref 12.0–15.0)
Immature Granulocytes: 4 %
Lymphocytes Relative: 17 %
Lymphs Abs: 2.6 10*3/uL (ref 0.7–4.0)
MCH: 27.4 pg (ref 26.0–34.0)
MCHC: 31.8 g/dL (ref 30.0–36.0)
MCV: 86.1 fL (ref 80.0–100.0)
Monocytes Absolute: 1.1 10*3/uL — ABNORMAL HIGH (ref 0.1–1.0)
Monocytes Relative: 7 %
Neutro Abs: 11 10*3/uL — ABNORMAL HIGH (ref 1.7–7.7)
Neutrophils Relative %: 71 %
Platelets: 181 10*3/uL (ref 150–400)
RBC: 3.1 MIL/uL — ABNORMAL LOW (ref 3.87–5.11)
RDW: 25 % — ABNORMAL HIGH (ref 11.5–15.5)
Smear Review: ADEQUATE
WBC: 15.5 10*3/uL — ABNORMAL HIGH (ref 4.0–10.5)
nRBC: 0.3 % — ABNORMAL HIGH (ref 0.0–0.2)

## 2018-10-06 LAB — MAGNESIUM: Magnesium: 1.8 mg/dL (ref 1.7–2.4)

## 2018-10-06 LAB — SAMPLE TO BLOOD BANK

## 2018-10-06 MED ORDER — LEUCOVORIN CALCIUM INJECTION 350 MG
650.0000 mg | Freq: Once | INTRAVENOUS | Status: AC
  Administered 2018-10-06: 650 mg via INTRAVENOUS
  Filled 2018-10-06: qty 25

## 2018-10-06 MED ORDER — SODIUM CHLORIDE 0.9 % IV SOLN
4150.0000 mg | INTRAVENOUS | Status: DC
  Administered 2018-10-06: 15:00:00 4150 mg via INTRAVENOUS
  Filled 2018-10-06: qty 83

## 2018-10-06 MED ORDER — IRINOTECAN HCL CHEMO INJECTION 100 MG/5ML
65.0000 mg/m2 | Freq: Once | INTRAVENOUS | Status: AC
  Administered 2018-10-06: 120 mg via INTRAVENOUS
  Filled 2018-10-06: qty 6

## 2018-10-06 MED ORDER — SODIUM CHLORIDE 0.9 % IV SOLN
Freq: Once | INTRAVENOUS | Status: AC
  Administered 2018-10-06: 13:00:00 via INTRAVENOUS
  Filled 2018-10-06: qty 250

## 2018-10-06 MED ORDER — LEUCOVORIN CALCIUM INJECTION 350 MG: 400.0000 mg/m2 | Freq: Once | INTRAVENOUS | Status: DC

## 2018-10-06 MED ORDER — OXALIPLATIN CHEMO INJECTION 100 MG/20ML
135.0000 mg | Freq: Once | INTRAVENOUS | Status: AC
  Administered 2018-10-06: 135 mg via INTRAVENOUS
  Filled 2018-10-06: qty 20

## 2018-10-06 MED ORDER — OXALIPLATIN CHEMO INJECTION 100 MG/20ML: 80.0000 mg/m2 | Freq: Once | INTRAVENOUS | Status: DC

## 2018-10-06 MED ORDER — DEXAMETHASONE SODIUM PHOSPHATE 10 MG/ML IJ SOLN
10.0000 mg | Freq: Once | INTRAMUSCULAR | Status: AC
  Administered 2018-10-06: 10 mg via INTRAVENOUS
  Filled 2018-10-06: qty 1

## 2018-10-06 MED ORDER — PALONOSETRON HCL INJECTION 0.25 MG/5ML
0.2500 mg | Freq: Once | INTRAVENOUS | Status: AC
  Administered 2018-10-06: 0.25 mg via INTRAVENOUS
  Filled 2018-10-06: qty 5

## 2018-10-06 MED ORDER — SODIUM CHLORIDE 0.9 % IV SOLN: 2400.0000 mg/m2 | INTRAVENOUS | Status: DC

## 2018-10-06 MED ORDER — SODIUM CHLORIDE 0.9% FLUSH
10.0000 mL | INTRAVENOUS | Status: DC | PRN
  Administered 2018-10-06: 10 mL via INTRAVENOUS
  Filled 2018-10-06: qty 10

## 2018-10-06 MED ORDER — ATROPINE SULFATE 1 MG/ML IJ SOLN
0.5000 mg | Freq: Once | INTRAMUSCULAR | Status: AC | PRN
  Administered 2018-10-06: 0.5 mg via INTRAVENOUS
  Filled 2018-10-06: qty 1

## 2018-10-06 MED ORDER — DEXTROSE 5 % IV SOLN
Freq: Once | INTRAVENOUS | Status: AC
  Administered 2018-10-06: 10:00:00 via INTRAVENOUS
  Filled 2018-10-06: qty 250

## 2018-10-06 NOTE — Progress Notes (Signed)
[place] OFFICE PROGRESS NOTE  Patient Care Team: Jodelle Green, FNP as PCP - General (Family Medicine) Clent Jacks, RN as Registered Nurse  Cancer Staging No matching staging information was found for the patient.     Malignant neoplasm of head of pancreas ([place])   06/08/2018 Initial Diagnosis    Malignant neoplasm of head of pancreas ([place])    07/13/2018 -  Chemotherapy    The patient had palonosetron (ALOXI) injection 0.25 mg, 0.25 mg, Intravenous,  Once, 6 of 12 cycles Administration: 0.25 mg (07/13/2018), 0.25 mg (09/22/2018), 0.25 mg (08/03/2018), 0.25 mg (08/18/2018), 0.25 mg (09/01/2018) pegfilgrastim (NEULASTA ONPRO KIT) injection 6 mg, 6 mg, Subcutaneous, Once, 6 of 12 cycles Administration: 6 mg (07/15/2018), 6 mg (08/05/2018), 6 mg (09/24/2018), 6 mg (08/20/2018), 6 mg (09/03/2018) irinotecan (CAMPTOSAR) 340 mg in dextrose 5 % 500 mL chemo infusion, 180 mg/m2 = 340 mg, Intravenous,  Once, 6 of 12 cycles Dose modification: 65 mg/m2 (original dose 180 mg/m2, Cycle 5, Reason: Dose not tolerated), 65 mg/m2 (original dose 180 mg/m2, Cycle 2, Reason: Dose not tolerated), 65 mg/m2 (original dose 180 mg/m2, Cycle 3, Reason: Dose not tolerated), 65 mg/m2 (original dose 180 mg/m2, Cycle 4, Reason: Dose not tolerated) Administration: 340 mg (07/13/2018), 120 mg (08/03/2018), 120 mg (08/18/2018), 120 mg (09/01/2018) leucovorin 750 mg in dextrose 5 % 250 mL infusion, 752 mg, Intravenous,  Once, 6 of 12 cycles Administration: 750 mg (07/13/2018), 750 mg (08/03/2018), 750 mg (08/18/2018), 750 mg (09/01/2018) oxaliplatin (ELOXATIN) 150 mg in dextrose 5 % 500 mL chemo infusion, 160 mg, Intravenous,  Once, 6 of 12 cycles Dose modification: 80 mg/m2 (original dose 85 mg/m2, Cycle 5, Reason: Provider Judgment), 75 mg/m2 (original dose 85 mg/m2, Cycle 2, Reason: Dose not tolerated), 80 mg/m2 (original dose 85 mg/m2, Cycle 4, Reason: Dose not tolerated) Administration: 150 mg (07/13/2018), 135 mg  (09/22/2018), 135 mg (08/03/2018), 150 mg (08/18/2018), 150 mg (09/01/2018) fluorouracil (ADRUCIL) chemo injection 750 mg, 400 mg/m2 = 750 mg, Intravenous,  Once, 4 of 4 cycles Dose modification: 200 mg/m2 (original dose 400 mg/m2, Cycle 2, Reason: Dose not tolerated) Administration: 750 mg (07/13/2018), 350 mg (08/03/2018), 750 mg (08/18/2018), 700 mg (09/01/2018) fluorouracil (ADRUCIL) 4,500 mg in sodium chloride 0.9 % 60 mL chemo infusion, 2,400 mg/m2 = 4,500 mg, Intravenous, 1 Day/Dose, 6 of 12 cycles Dose modification: 1,200 mg/m2 (original dose 2,400 mg/m2, Cycle 2, Reason: Dose not tolerated) Administration: 4,500 mg (07/13/2018), 4,150 mg (09/22/2018), 2,250 mg (08/03/2018), 4,500 mg (08/18/2018), 4,150 mg (09/01/2018)  for chemotherapy treatment.        INTERVAL HISTORY:  Lynn Taylor 73 y.o.  female pleasant patient above history of stage II pancreatic adenocarcinoma currently on adjuvant FOLFIRINOX chemotherapy.  Patient complains of intermittent diarrhea especially mornings.  Otherwise currently improved.  She is taking Imodium as needed.  Intermittent nausea no vomiting.  No chest pain or shortness of breath.  Denies any significant tingling or numbness.   Mild tingling and numbness. Review of Systems  Constitutional: Positive for malaise/fatigue and weight loss. Negative for chills, diaphoresis and fever.  HENT: Negative for nosebleeds and sore throat.   Eyes: Negative for double vision.  Respiratory: Negative for cough, hemoptysis, sputum production, shortness of breath and wheezing.   Cardiovascular: Negative for chest pain, palpitations, orthopnea and leg swelling.  Gastrointestinal: Positive for diarrhea and nausea. Negative for abdominal pain, blood in stool, constipation, heartburn, melena and vomiting.  Genitourinary: Negative for dysuria, frequency and urgency.  Musculoskeletal:  Negative for back pain and joint pain.  Skin: Negative.  Negative for itching and rash.   Neurological: Positive for tingling. Negative for dizziness, focal weakness, weakness and headaches.  Endo/Heme/Allergies: Does not bruise/bleed easily.  Psychiatric/Behavioral: Negative for depression. The patient is not nervous/anxious and does not have insomnia.       PAST MEDICAL HISTORY :  Past Medical History:  Diagnosis Date  . Cancer ([place])   . GERD (gastroesophageal reflux disease)   . Hypertension   . Hypothyroidism   . Malignant neoplasm of head of pancreas ([place]) 06/08/2018    PAST SURGICAL HISTORY :   Past Surgical History:  Procedure Laterality Date  . CHOLECYSTECTOMY    . COLONOSCOPY N/A 12/02/2016   Procedure: COLONOSCOPY;  Surgeon: Lollie Sails, MD;  Location: [facility] ENDOSCOPY;  Service: Endoscopy;  Laterality: N/A;  . ERCP N/A 04/30/2018   Procedure: ENDOSCOPIC RETROGRADE CHOLANGIOPANCREATOGRAPHY (ERCP);  Surgeon: Lucilla Lame, MD;  Location: [facility] ENDOSCOPY;  Service: Endoscopy;  Laterality: N/A;  . ERCP N/A 05/05/2018   Procedure: ENDOSCOPIC RETROGRADE CHOLANGIOPANCREATOGRAPHY (ERCP);  Surgeon: Lucilla Lame, MD;  Location: [facility] ENDOSCOPY;  Service: Endoscopy;  Laterality: N/A;  . PORTA CATH INSERTION N/A 06/28/2018   Procedure: PORTA CATH INSERTION;  Surgeon: Algernon Huxley, MD;  Location: [place] CV LAB;  Service: Cardiovascular;  Laterality: N/A;    FAMILY HISTORY :   Family History  Problem Relation Age of Onset  . Diabetes Mellitus I Other   . Alcoholism Other   . Hypertension Other   . Hyperlipidemia Other   . Coronary artery disease Other   . Stroke Other   . Osteoarthritis Other   . Migraines Other   . Heart Problems Mother   . Stroke Sister   . CAD Brother   . Stroke Brother   . Breast cancer Neg Hx     SOCIAL HISTORY:   Social History   Tobacco Use  . Smoking status: Former Smoker    Last attempt to quit: 04/14/1988    Years since quitting: 30.4  . Smokeless tobacco: Never Used  Substance Use Topics  . Alcohol use: Yes     Comment: social drinker  . Drug use: No    ALLERGIES:  has No Known Allergies.  MEDICATIONS:  Current Outpatient Medications  Medication Sig Dispense Refill  . Cholecalciferol (VITAMIN D-3) 25 MCG (1000 UT) CAPS Take by mouth.    . levothyroxine (SYNTHROID, LEVOTHROID) 100 MCG tablet Take 1 tablet (100 mcg total) by mouth daily before breakfast. 90 tablet 4  . lidocaine-prilocaine (EMLA) cream Apply 1 application topically as needed. 30 g 3  . Magnesium Cl-Calcium Carbonate (SLOW-MAG PO) Take 2 tablets by mouth daily.    . megestrol (MEGACE) 40 MG tablet Take 1 tablet (40 mg total) by mouth 4 (four) times daily. 120 tablet 0  . omeprazole (PRILOSEC) 20 MG capsule Take 1 capsule (20 mg total) by mouth daily. 30 capsule 2  . ondansetron (ZOFRAN) 4 MG tablet Take 1 tablet (4 mg total) by mouth every 6 (six) hours as needed for nausea or vomiting. 30 tablet 3  . potassium chloride SA (K-DUR) 20 MEQ tablet TAKE 2 TABLETS BY MOUTH DAILY 28 tablet 0  . prochlorperazine (COMPAZINE) 10 MG tablet Take 1 tablet (10 mg total) by mouth every 6 (six) hours as needed (NAUSEA). 30 tablet 1  . chlorhexidine (PERIDEX) 0.12 % solution Use as directed 15 mLs in the mouth or throat 2 (two) times daily. (Patient not taking:  Reported on 09/29/2018) 473 mL 3  . loperamide (IMODIUM) 2 MG capsule Take 1 capsule (2 mg total) by mouth See admin instructions. With onset of loose stool, take '4mg'$  followed by '2mg'$  every 2 hours until 12 hours have passed without loose bowel movement. Maximum: 16 mg/day (Patient not taking: Reported on 09/22/2018) 120 capsule 1  . traMADol (ULTRAM) 50 MG tablet Take 1 tablet (50 mg total) by mouth every 6 (six) hours as needed. (Patient not taking: Reported on 09/29/2018) 30 tablet 0   No current facility-administered medications for this visit.    Facility-Administered Medications Ordered in Other Visits  Medication Dose Route Frequency Provider Last Rate Last Dose  . 0.9 %  sodium chloride  infusion   Intravenous Once Charlaine Dalton R, MD      . fluorouracil (ADRUCIL) 4,150 mg in sodium chloride 0.9 % 67 mL chemo infusion  4,150 mg Intravenous 1 day or 1 dose Earlie Server, MD      . irinotecan (CAMPTOSAR) 120 mg in dextrose 5 % 500 mL chemo infusion  65 mg/m2 (Treatment Plan Recorded) Intravenous Once Charlaine Dalton R, MD      . leucovorin 650 mg in dextrose 5 % 250 mL infusion  650 mg Intravenous Once Earlie Server, MD      . oxaliplatin (ELOXATIN) 135 mg in dextrose 5 % 500 mL chemo infusion  135 mg Intravenous Once Earlie Server, MD      . sodium chloride flush (NS) 0.9 % injection 10 mL  10 mL Intravenous PRN Cammie Sickle, MD   10 mL at 10/06/18 0835    PHYSICAL EXAMINATION: ECOG PERFORMANCE STATUS: 0 - Asymptomatic  BP 126/83   Pulse (!) 108   Temp (!) 97.5 F (36.4 C) (Tympanic)   Resp 16   Ht '5\' 2"'$  (1.575 m)   Wt 137 lb 11.2 oz (62.5 kg)   BMI 25.19 kg/m   Filed Weights   10/06/18 0851  Weight: 137 lb 11.2 oz (62.5 kg)    Physical Exam  Constitutional: She is oriented to person, place, and time and well-developed, well-nourished, and in no distress.  Thin built female patient resting in a wheelchair.  She is alone.  HENT:  Head: Normocephalic and atraumatic.  Mouth/Throat: Oropharynx is clear and moist. No oropharyngeal exudate.  Eyes: Pupils are equal, round, and reactive to light.  Neck: Normal range of motion. Neck supple.  Cardiovascular: Normal rate and regular rhythm.  Pulmonary/Chest: Breath sounds normal. No respiratory distress. She has no wheezes.  Abdominal: Soft. Bowel sounds are normal. She exhibits no distension and no mass. There is no abdominal tenderness. There is no rebound and no guarding.  Musculoskeletal: Normal range of motion.        General: No tenderness or edema.  Neurological: She is alert and oriented to person, place, and time.  Skin: Skin is warm.  Psychiatric: Affect normal.       LABORATORY DATA:  I have  reviewed the data as listed    Component Value Date/Time   NA 134 (L) 10/06/2018 0824   NA 137 04/21/2018 1046   K 4.2 10/06/2018 0824   CL 102 10/06/2018 0824   CO2 23 10/06/2018 0824   GLUCOSE 106 (H) 10/06/2018 0824   BUN 13 10/06/2018 0824   BUN 23 04/21/2018 1046   CREATININE 0.83 10/06/2018 0824   CALCIUM 9.7 10/06/2018 0824   PROT 6.8 10/06/2018 0824   PROT 7.3 05/10/2018 1429   ALBUMIN 3.3 (L)  10/06/2018 0824   ALBUMIN 3.8 05/10/2018 1429   AST 23 10/06/2018 0824   ALT 12 10/06/2018 0824   ALKPHOS 121 10/06/2018 0824   BILITOT 0.5 10/06/2018 0824   BILITOT 6.2 (H) 05/10/2018 1429   GFRNONAA >60 10/06/2018 0824   GFRAA >60 10/06/2018 0824    No results found for: SPEP, UPEP  Lab Results  Component Value Date   WBC 15.5 (H) 10/06/2018   NEUTROABS 11.0 (H) 10/06/2018   HGB 8.5 (L) 10/06/2018   HCT 26.7 (L) 10/06/2018   MCV 86.1 10/06/2018   PLT 181 10/06/2018      Chemistry      Component Value Date/Time   NA 134 (L) 10/06/2018 0824   NA 137 04/21/2018 1046   K 4.2 10/06/2018 0824   CL 102 10/06/2018 0824   CO2 23 10/06/2018 0824   BUN 13 10/06/2018 0824   BUN 23 04/21/2018 1046   CREATININE 0.83 10/06/2018 0824      Component Value Date/Time   CALCIUM 9.7 10/06/2018 0824   ALKPHOS 121 10/06/2018 0824   AST 23 10/06/2018 0824   ALT 12 10/06/2018 0824   BILITOT 0.5 10/06/2018 0824   BILITOT 6.2 (H) 05/10/2018 1429       RADIOGRAPHIC STUDIES: I have personally reviewed the radiological images as listed and agreed with the findings in the report. No results found.   ASSESSMENT & PLAN:  Malignant neoplasm of head of pancreas ([place]) #Pancreatic adenocarcinoma/ pT2 pN1 M0, Stage IIB currently on adjuvant FOLFIRINOX.   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  #Proceed with FOLFIRINOX treatment #6  today. Labs today reviewed;  acceptable for treatment today.  Discussed the post  chemotherapy plan would be is to do surveillance imaging/labs.  # Hypomagnesemia-magnesium 1.8 stable  # Elevated Alkaline phosphatase- sec to chemo vs. Other.  Improving.  Likely from chemotherapy.  Recent CA 19 normal.  # Grade 1 diarrhea, stable.  Imodium as instructed.  #Chemotherapy-induced thrombocytopenia,stable Monitor.   #Chemotherapy-induced anemia.  Stable. Monitor.   #Peripheral neuropathy oxaliplatin grade 1 -STABLE.   # DISPOSITION: # Chemo today; pump off/onpro Friday; IVF- 1 lit over 1 hour. No mag. # IVF 1 hour on 5/1;  appt with Dr.Yu on 5/4-cbc/bmp/mag- Wed/ IVFs over 1 hou Mag 2 gm/1 hour # in 2 weeks- MD/Dr.B- FOLFIRINOX; Day-3 pump off/onpro-Dr.B    Orders Placed This Encounter  Procedures  . CBC with Differential    Standing Status:   Future    Standing Expiration Date:   10/06/2019  . Basic metabolic panel    Standing Status:   Future    Standing Expiration Date:   10/06/2019  . Magnesium    Standing Status:   Future    Standing Expiration Date:   10/06/2019   All questions were answered. The patient knows to call the clinic with any problems, questions or concerns.      Cammie Sickle, MD 10/06/2018 10:29 AM

## 2018-10-06 NOTE — Progress Notes (Signed)
[facility]  [phone] [phone]   Name: Lynn Taylor Date: 10/06/2018 MRN: [id number]  DOB: 1945/12/14  Patient Care Team: Jodelle Green, FNP as PCP - General (Family Medicine) Clent Jacks, RN as Registered Nurse    REASON FOR CONSULTATION: Palliative Care consult requested for this 73 y.o. female with multiple medical problems including stage IIB pancreatic cancer status post [place] on adjuvant FOLFIRINOX chemotherapy.  Patient has had weakness and poor oral intake.  She was referred to palliative care for support and to address goals.  SOCIAL HISTORY:     reports that she quit smoking about 30 years ago. She has never used smokeless tobacco. She reports current alcohol use. She reports that she does not use drugs.   Patient is married and lives at home with her husband, daughter, and granddaughter.  Patient also has 2 sons, 1 of whom lives nearby and the other in [place].  Patient retired from [employer] and then worked for several years in [employer].  ADVANCE DIRECTIVES:    CODE STATUS:   PAST MEDICAL HISTORY: Past Medical History:  Diagnosis Date  . Cancer ([place])   . GERD (gastroesophageal reflux disease)   . Hypertension   . Hypothyroidism   . Malignant neoplasm of head of pancreas ([place]) 06/08/2018    PAST SURGICAL HISTORY:  Past Surgical History:  Procedure Laterality Date  . CHOLECYSTECTOMY    . COLONOSCOPY N/A 12/02/2016   Procedure: COLONOSCOPY;  Surgeon: Lollie Sails, MD;  Location: [facility] ENDOSCOPY;  Service: Endoscopy;  Laterality: N/A;  . ERCP N/A 04/30/2018   Procedure: ENDOSCOPIC RETROGRADE CHOLANGIOPANCREATOGRAPHY (ERCP);  Surgeon: Lucilla Lame, MD;  Location[facility] ENDOSCOPY;  Service: Endoscopy;  Laterality: N/A;  . ERCP N/A 05/05/2018   Procedure: ENDOSCOPIC RETROGRADE CHOLANGIOPANCREATOGRAPHY (ERCP);  Surgeon: Lucilla Lame, MD;  Location: [facility] ENDOSCOPY;   Service: Endoscopy;  Laterality: N/A;  . PORTA CATH INSERTION N/A 06/28/2018   Procedure: PORTA CATH INSERTION;  Surgeon: Algernon Huxley, MD;  Location: [facility] CV LAB;  Service: Cardiovascular;  Laterality: N/A;    HEMATOLOGY/ONCOLOGY HISTORY:    Malignant neoplasm of head of pancreas ([place])   06/08/2018 Initial Diagnosis    Malignant neoplasm of head of pancreas ([place])    07/13/2018 -  Chemotherapy    The patient had palonosetron (ALOXI) injection 0.25 mg, 0.25 mg, Intravenous,  Once, 6 of 12 cycles Administration: 0.25 mg (07/13/2018), 0.25 mg (09/22/2018), 0.25 mg (10/06/2018), 0.25 mg (08/03/2018), 0.25 mg (08/18/2018), 0.25 mg (09/01/2018) pegfilgrastim (NEULASTA ONPRO KIT) injection 6 mg, 6 mg, Subcutaneous, Once, 6 of 12 cycles Administration: 6 mg (07/15/2018), 6 mg (08/05/2018), 6 mg (09/24/2018), 6 mg (08/20/2018), 6 mg (09/03/2018) irinotecan (CAMPTOSAR) 340 mg in dextrose 5 % 500 mL chemo infusion, 180 mg/m2 = 340 mg, Intravenous,  Once, 6 of 12 cycles Dose modification: 65 mg/m2 (original dose 180 mg/m2, Cycle 5, Reason: Dose not tolerated), 65 mg/m2 (original dose 180 mg/m2, Cycle 2, Reason: Dose not tolerated), 65 mg/m2 (original dose 180 mg/m2, Cycle 3, Reason: Dose not tolerated), 65 mg/m2 (original dose 180 mg/m2, Cycle 4, Reason: Dose not tolerated) Administration: 340 mg (07/13/2018), 120 mg (10/06/2018), 120 mg (08/03/2018), 120 mg (08/18/2018), 120 mg (09/01/2018) leucovorin 750 mg in dextrose 5 % 250 mL infusion, 752 mg, Intravenous,  Once, 6 of 12 cycles Administration: 750 mg (07/13/2018), 650 mg (10/06/2018), 750 mg (08/03/2018), 750 mg (08/18/2018), 750 mg (09/01/2018) oxaliplatin (ELOXATIN) 150 mg in dextrose 5 %  500 mL chemo infusion, 160 mg, Intravenous,  Once, 6 of 12 cycles Dose modification: 80 mg/m2 (original dose 85 mg/m2, Cycle 5, Reason: Provider Judgment), 75 mg/m2 (original dose 85 mg/m2, Cycle 2, Reason: Dose not tolerated), 80 mg/m2 (original dose 85 mg/m2, Cycle 4, Reason: Dose  not tolerated) Administration: 150 mg (07/13/2018), 135 mg (09/22/2018), 135 mg (10/06/2018), 135 mg (08/03/2018), 150 mg (08/18/2018), 150 mg (09/01/2018) fluorouracil (ADRUCIL) chemo injection 750 mg, 400 mg/m2 = 750 mg, Intravenous,  Once, 4 of 4 cycles Dose modification: 200 mg/m2 (original dose 400 mg/m2, Cycle 2, Reason: Dose not tolerated) Administration: 750 mg (07/13/2018), 350 mg (08/03/2018), 750 mg (08/18/2018), 700 mg (09/01/2018) fluorouracil (ADRUCIL) 4,500 mg in sodium chloride 0.9 % 60 mL chemo infusion, 2,400 mg/m2 = 4,500 mg, Intravenous, 1 Day/Dose, 6 of 12 cycles Dose modification: 1,200 mg/m2 (original dose 2,400 mg/m2, Cycle 2, Reason: Dose not tolerated) Administration: 4,500 mg (07/13/2018), 4,150 mg (09/22/2018), 2,250 mg (08/03/2018), 4,500 mg (08/18/2018), 4,150 mg (09/01/2018)  for chemotherapy treatment.      ALLERGIES:  has No Known Allergies.  MEDICATIONS:  Current Outpatient Medications  Medication Sig Dispense Refill  . chlorhexidine (PERIDEX) 0.12 % solution Use as directed 15 mLs in the mouth or throat 2 (two) times daily. (Patient not taking: Reported on 09/29/2018) 473 mL 3  . Cholecalciferol (VITAMIN D-3) 25 MCG (1000 UT) CAPS Take by mouth.    . levothyroxine (SYNTHROID, LEVOTHROID) 100 MCG tablet Take 1 tablet (100 mcg total) by mouth daily before breakfast. 90 tablet 4  . lidocaine-prilocaine (EMLA) cream Apply 1 application topically as needed. 30 g 3  . loperamide (IMODIUM) 2 MG capsule Take 1 capsule (2 mg total) by mouth See admin instructions. With onset of loose stool, take 77m followed by 270mevery 2 hours until 12 hours have passed without loose bowel movement. Maximum: 16 mg/day (Patient not taking: Reported on 09/22/2018) 120 capsule 1  . Magnesium Cl-Calcium Carbonate (SLOW-MAG PO) Take 2 tablets by mouth daily.    . megestrol (MEGACE) 40 MG tablet Take 1 tablet (40 mg total) by mouth 4 (four) times daily. 120 tablet 0  . omeprazole (PRILOSEC) 20 MG capsule  Take 1 capsule (20 mg total) by mouth daily. 30 capsule 2  . ondansetron (ZOFRAN) 4 MG tablet Take 1 tablet (4 mg total) by mouth every 6 (six) hours as needed for nausea or vomiting. 30 tablet 3  . potassium chloride SA (K-DUR) 20 MEQ tablet TAKE 2 TABLETS BY MOUTH DAILY 28 tablet 0  . prochlorperazine (COMPAZINE) 10 MG tablet Take 1 tablet (10 mg total) by mouth every 6 (six) hours as needed (NAUSEA). 30 tablet 1  . traMADol (ULTRAM) 50 MG tablet Take 1 tablet (50 mg total) by mouth every 6 (six) hours as needed. (Patient not taking: Reported on 09/29/2018) 30 tablet 0   No current facility-administered medications for this visit.    Facility-Administered Medications Ordered in Other Visits  Medication Dose Route Frequency Provider Last Rate Last Dose  . 0.9 %  sodium chloride infusion   Intravenous Once BrCammie SickleMD 720 mL/hr at 10/06/18 1304    . fluorouracil (ADRUCIL) 4,150 mg in sodium chloride 0.9 % 67 mL chemo infusion  4,150 mg Intravenous 1 day or 1 dose YuEarlie ServerMD      . irinotecan (CAMPTOSAR) 120 mg in dextrose 5 % 500 mL chemo infusion  65 mg/m2 (Treatment Plan Recorded) Intravenous Once BrCammie SickleMD 337 mL/hr at 10/06/18 1304    .  leucovorin 650 mg in dextrose 5 % 250 mL infusion  650 mg Intravenous Once Earlie Server, MD 189 mL/hr at 10/06/18 1304    . sodium chloride flush (NS) 0.9 % injection 10 mL  10 mL Intravenous PRN Cammie Sickle, MD   10 mL at 10/06/18 0835    VITAL SIGNS: There were no vitals taken for this visit. There were no vitals filed for this visit.  Estimated body mass index is 25.19 kg/m as calculated from the following:   Height as of an earlier encounter on 10/06/18: _0  (1.575 m).   Weight as of an earlier encounter on 10/06/18: 137 lb 11.2 oz (62.5 kg).  LABS: CBC:    Component Value Date/Time   WBC 15.5 (H) 10/06/2018 0824   HGB 8.5 (L) 10/06/2018 0824   HGB 13.0 11/12/2017 0837   HCT 26.7 (L) 10/06/2018 0824   HCT  38.5 11/12/2017 0837   PLT 181 10/06/2018 0824   PLT 228 11/12/2017 0837   MCV 86.1 10/06/2018 0824   MCV 84 11/12/2017 0837   NEUTROABS 11.0 (H) 10/06/2018 0824   LYMPHSABS 2.6 10/06/2018 0824   MONOABS 1.1 (H) 10/06/2018 0824   EOSABS 0.0 10/06/2018 0824   BASOSABS 0.1 10/06/2018 0824   Comprehensive Metabolic Panel:    Component Value Date/Time   NA 134 (L) 10/06/2018 0824   NA 137 04/21/2018 1046   K 4.2 10/06/2018 0824   CL 102 10/06/2018 0824   CO2 23 10/06/2018 0824   BUN 13 10/06/2018 0824   BUN 23 04/21/2018 1046   CREATININE 0.83 10/06/2018 0824   GLUCOSE 106 (H) 10/06/2018 0824   CALCIUM 9.7 10/06/2018 0824   AST 23 10/06/2018 0824   ALT 12 10/06/2018 0824   ALKPHOS 121 10/06/2018 0824   BILITOT 0.5 10/06/2018 0824   BILITOT 6.2 (H) 05/10/2018 1429   PROT 6.8 10/06/2018 0824   PROT 7.3 05/10/2018 1429   ALBUMIN 3.3 (L) 10/06/2018 0824   ALBUMIN 3.8 05/10/2018 1429    RADIOGRAPHIC STUDIES: No results found.  PERFORMANCE STATUS (ECOG) : 2 - Symptomatic, <50% confined to bed  Review of Systems Unless otherwise noted, a complete review of systems is negative.  Physical Exam General: NAD, frail appearing, thin Pulmonary: Unlabored Extremities: no edema Skin: no rashes Neurological: Weakness but otherwise nonfocal  IMPRESSION: I met with patient today while she was in the infusion area receiving treatment.  I introduced palliative care services and attempted establish therapeutic rapport.  Patient denies any acute or distressing symptoms today.  She feels she is doing reasonably well considering.  She does endorse chronic poor appetite that she relates to change in taste and smell.  She has been followed by our dietitian.  Weight down significantly over past month to 137 pounds from 153 pounds one month ago and 162 pounds the month prior.  She has been started on Megace.  We discussed the importance of small portions more frequently throughout the day with a  particular focus on increasing protein and calorie intake.  I also encouraged increased fluids.  Patient reports having a good support system in the home.  She has been weak and requiring assistance with transfers, bathing, and dressing.  Her daughter and granddaughter live in the home and help her with these tasks.  We discussed fall mitigation strategies.  She denies needing a cane or walker.  Patient reports she is coping well with her illness.  She denies depression.  Again, she feels she has  good support at home.  We will follow-up with patient next week when I can see her in exam room and have more in-depth conversation with her regarding her goals and advance care planning.  PLAN: -Continue current scope of treatment -Will follow -RTC in 1 week   Patient expressed understanding and was in agreement with this plan. She also understands that She can call the clinic at any time with any questions, concerns, or complaints.     Time Total: 30 minutes  Visit consisted of counseling and education dealing with the complex and emotionally intense issues of symptom management and palliative care in the setting of serious and potentially life-threatening illness.Greater than 50%  of this time was spent counseling and coordinating care related to the above assessment and plan.  Signed by: Altha Harm, PhD, NP-C [phone] (Work Cell)

## 2018-10-06 NOTE — Assessment & Plan Note (Addendum)
#  Pancreatic adenocarcinoma/ pT2 pN1 M0, Stage IIB currently on adjuvant FOLFIRINOX.   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  #Proceed with FOLFIRINOX treatment #6  today. Labs today reviewed;  acceptable for treatment today.  Discussed the post chemotherapy plan would be is to do surveillance imaging/labs.  # Hypomagnesemia-magnesium 1.8 stable  # Elevated Alkaline phosphatase- sec to chemo vs. Other.  Improving.  Likely from chemotherapy.  Recent CA 19 normal.  # Grade 1 diarrhea, stable.  Imodium as instructed.  #Chemotherapy-induced thrombocytopenia,stable Monitor.   #Chemotherapy-induced anemia.  Stable. Monitor.   #Peripheral neuropathy oxaliplatin grade 1 -STABLE.   # DISPOSITION: # Chemo today; pump off/onpro Friday; IVF- 1 lit over 1 hour. No mag. # IVF 1 hour on 5/1;  appt with Dr.Yu on 5/4-cbc/bmp/mag- Wed/ IVFs over 1 hou Mag 2 gm/1 hour # in 2 weeks- MD/Dr.B- FOLFIRINOX; Day-3 pump off/onpro-Dr.B

## 2018-10-06 NOTE — Progress Notes (Signed)
HR 108 ok to proceed per md

## 2018-10-08 ENCOUNTER — Other Ambulatory Visit: Payer: Self-pay

## 2018-10-08 ENCOUNTER — Inpatient Hospital Stay: Payer: Medicare Other | Attending: Oncology

## 2018-10-08 VITALS — BP 124/80 | HR 93 | Temp 98.3°F | Resp 18

## 2018-10-08 DIAGNOSIS — G620 Drug-induced polyneuropathy: Secondary | ICD-10-CM | POA: Diagnosis not present

## 2018-10-08 DIAGNOSIS — R5383 Other fatigue: Secondary | ICD-10-CM | POA: Insufficient documentation

## 2018-10-08 DIAGNOSIS — Z5111 Encounter for antineoplastic chemotherapy: Secondary | ICD-10-CM | POA: Insufficient documentation

## 2018-10-08 DIAGNOSIS — I10 Essential (primary) hypertension: Secondary | ICD-10-CM | POA: Diagnosis not present

## 2018-10-08 DIAGNOSIS — Z515 Encounter for palliative care: Secondary | ICD-10-CM | POA: Diagnosis not present

## 2018-10-08 DIAGNOSIS — R531 Weakness: Secondary | ICD-10-CM | POA: Diagnosis not present

## 2018-10-08 DIAGNOSIS — Z79899 Other long term (current) drug therapy: Secondary | ICD-10-CM | POA: Diagnosis not present

## 2018-10-08 DIAGNOSIS — R197 Diarrhea, unspecified: Secondary | ICD-10-CM | POA: Insufficient documentation

## 2018-10-08 DIAGNOSIS — E876 Hypokalemia: Secondary | ICD-10-CM | POA: Diagnosis not present

## 2018-10-08 DIAGNOSIS — K219 Gastro-esophageal reflux disease without esophagitis: Secondary | ICD-10-CM | POA: Insufficient documentation

## 2018-10-08 DIAGNOSIS — L89159 Pressure ulcer of sacral region, unspecified stage: Secondary | ICD-10-CM | POA: Diagnosis not present

## 2018-10-08 DIAGNOSIS — D6959 Other secondary thrombocytopenia: Secondary | ICD-10-CM | POA: Diagnosis not present

## 2018-10-08 DIAGNOSIS — R630 Anorexia: Secondary | ICD-10-CM | POA: Insufficient documentation

## 2018-10-08 DIAGNOSIS — E860 Dehydration: Secondary | ICD-10-CM | POA: Insufficient documentation

## 2018-10-08 DIAGNOSIS — T451X5S Adverse effect of antineoplastic and immunosuppressive drugs, sequela: Secondary | ICD-10-CM | POA: Insufficient documentation

## 2018-10-08 DIAGNOSIS — Z87891 Personal history of nicotine dependence: Secondary | ICD-10-CM | POA: Diagnosis not present

## 2018-10-08 DIAGNOSIS — R7989 Other specified abnormal findings of blood chemistry: Secondary | ICD-10-CM | POA: Insufficient documentation

## 2018-10-08 DIAGNOSIS — C250 Malignant neoplasm of head of pancreas: Secondary | ICD-10-CM | POA: Diagnosis not present

## 2018-10-08 DIAGNOSIS — R5381 Other malaise: Secondary | ICD-10-CM | POA: Diagnosis not present

## 2018-10-08 DIAGNOSIS — E46 Unspecified protein-calorie malnutrition: Secondary | ICD-10-CM | POA: Diagnosis not present

## 2018-10-08 DIAGNOSIS — D6481 Anemia due to antineoplastic chemotherapy: Secondary | ICD-10-CM | POA: Diagnosis not present

## 2018-10-08 DIAGNOSIS — E871 Hypo-osmolality and hyponatremia: Secondary | ICD-10-CM | POA: Insufficient documentation

## 2018-10-08 DIAGNOSIS — E039 Hypothyroidism, unspecified: Secondary | ICD-10-CM | POA: Diagnosis not present

## 2018-10-08 MED ORDER — SODIUM CHLORIDE 0.9 % IV SOLN
Freq: Once | INTRAVENOUS | Status: AC
  Administered 2018-10-08: 1000 mL via INTRAVENOUS
  Filled 2018-10-08: qty 250

## 2018-10-08 MED ORDER — HEPARIN SOD (PORK) LOCK FLUSH 100 UNIT/ML IV SOLN
500.0000 [IU] | Freq: Once | INTRAVENOUS | Status: AC | PRN
  Administered 2018-10-08: 500 [IU]
  Filled 2018-10-08: qty 5

## 2018-10-08 MED ORDER — SODIUM CHLORIDE 0.9% FLUSH
10.0000 mL | INTRAVENOUS | Status: DC | PRN
  Administered 2018-10-08: 10 mL
  Filled 2018-10-08: qty 10

## 2018-10-08 MED ORDER — PEGFILGRASTIM 6 MG/0.6ML ~~LOC~~ PSKT
6.0000 mg | PREFILLED_SYRINGE | Freq: Once | SUBCUTANEOUS | Status: AC
  Administered 2018-10-08: 6 mg via SUBCUTANEOUS
  Filled 2018-10-08: qty 0.6

## 2018-10-11 ENCOUNTER — Other Ambulatory Visit: Payer: Self-pay

## 2018-10-11 ENCOUNTER — Inpatient Hospital Stay: Payer: Medicare Other

## 2018-10-11 VITALS — BP 101/69 | HR 102 | Temp 97.6°F | Resp 18

## 2018-10-11 DIAGNOSIS — Z5111 Encounter for antineoplastic chemotherapy: Secondary | ICD-10-CM | POA: Diagnosis not present

## 2018-10-11 DIAGNOSIS — E871 Hypo-osmolality and hyponatremia: Secondary | ICD-10-CM

## 2018-10-11 MED ORDER — HEPARIN SOD (PORK) LOCK FLUSH 100 UNIT/ML IV SOLN
INTRAVENOUS | Status: AC
  Filled 2018-10-11: qty 5

## 2018-10-11 MED ORDER — HEPARIN SOD (PORK) LOCK FLUSH 100 UNIT/ML IV SOLN
500.0000 [IU] | Freq: Once | INTRAVENOUS | Status: AC | PRN
  Administered 2018-10-11: 11:00:00 500 [IU]

## 2018-10-11 MED ORDER — SODIUM CHLORIDE 0.9 % IV SOLN
Freq: Once | INTRAVENOUS | Status: AC
  Administered 2018-10-11: 10:00:00 via INTRAVENOUS
  Filled 2018-10-11: qty 250

## 2018-10-12 ENCOUNTER — Encounter: Payer: Self-pay | Admitting: Oncology

## 2018-10-12 ENCOUNTER — Inpatient Hospital Stay (HOSPITAL_BASED_OUTPATIENT_CLINIC_OR_DEPARTMENT_OTHER): Payer: Medicare Other | Admitting: Oncology

## 2018-10-12 ENCOUNTER — Other Ambulatory Visit: Payer: Self-pay

## 2018-10-12 DIAGNOSIS — D6481 Anemia due to antineoplastic chemotherapy: Secondary | ICD-10-CM | POA: Diagnosis not present

## 2018-10-12 DIAGNOSIS — E871 Hypo-osmolality and hyponatremia: Secondary | ICD-10-CM

## 2018-10-12 DIAGNOSIS — C250 Malignant neoplasm of head of pancreas: Secondary | ICD-10-CM

## 2018-10-12 DIAGNOSIS — T451X5A Adverse effect of antineoplastic and immunosuppressive drugs, initial encounter: Secondary | ICD-10-CM

## 2018-10-12 DIAGNOSIS — E46 Unspecified protein-calorie malnutrition: Secondary | ICD-10-CM | POA: Diagnosis not present

## 2018-10-12 DIAGNOSIS — Z79899 Other long term (current) drug therapy: Secondary | ICD-10-CM

## 2018-10-12 DIAGNOSIS — R197 Diarrhea, unspecified: Secondary | ICD-10-CM

## 2018-10-12 DIAGNOSIS — D696 Thrombocytopenia, unspecified: Secondary | ICD-10-CM

## 2018-10-12 MED ORDER — NYSTATIN 100000 UNIT/ML MT SUSP: 5.0000 mL | Freq: Four times a day (QID) | OROMUCOSAL | 1 refills | Status: DC

## 2018-10-12 NOTE — Progress Notes (Signed)
HEMATOLOGY-ONCOLOGY TeleHEALTH VISIT PROGRESS NOTE  I connected with Lynn Taylor on 10/12/18 at  9:00 AM EDT by video enabled telemedicine visit and verified that I am speaking with the correct person using two identifiers. I discussed the limitations, risks, security and privacy concerns of performing an evaluation and management service by telemedicine and the availability of in-person appointments. I also discussed with the patient that there may be a patient responsible charge related to this service. The patient expressed understanding and agreed to proceed.   Other persons participating in the visit and their role in the encounter:  Geraldine Solar, Lorenzo, check in patient     Patient's location: Home  Provider's location: Work Chief Complaint: Follow-up for tolerability of chemotherapy, dehydration, electrolyte imbalance.   INTERVAL HISTORY Lynn Taylor is a 73 y.o. female who has above history reviewed by me today presents for follow up visit for management of tolerability of chemotherapy treatment for pancreatic cancer, dehydration and electrolyte imbalance seen. Problems and complaints are listed below:  Patient was seen and evaluated by Dr. B last week and she underwent cycle 6 modified FOLFIRINOX last week.  Today she reports feeling weak and tired.  Requested to have IV fluid 2 days ago and reports feeling better.  Reports feeling better after each IV hydration session. Denies any nausea or vomiting.  Intermittent diarrhea, she takes Imodium as instructed. Denies any fever, chills, chest pain, shortness of breath or abdominal pain. Appetite is not good.  SHe takes Megace but not taking 4 times a day. Fatigue: reports worsening fatigue. Chronic onset, perisistent, no aggravating or improving factors, no associated symptoms.   Review of Systems  Constitutional: Positive for appetite change and fatigue. Negative for chills and fever.  HENT:   Negative for hearing loss and voice  change.   Eyes: Negative for eye problems.  Respiratory: Negative for chest tightness and cough.   Cardiovascular: Negative for chest pain.  Gastrointestinal: Positive for diarrhea. Negative for abdominal distention, abdominal pain and blood in stool.  Endocrine: Negative for hot flashes.  Genitourinary: Negative for difficulty urinating and frequency.   Musculoskeletal: Negative for arthralgias.  Skin: Negative for itching and rash.  Neurological: Negative for extremity weakness.  Hematological: Negative for adenopathy.  Psychiatric/Behavioral: Negative for confusion.    Past Medical History:  Diagnosis Date  . Cancer ([place])   . GERD (gastroesophageal reflux disease)   . Hypertension   . Hypothyroidism   . Malignant neoplasm of head of pancreas ([place]) 06/08/2018   Past Surgical History:  Procedure Laterality Date  . CHOLECYSTECTOMY    . COLONOSCOPY N/A 12/02/2016   Procedure: COLONOSCOPY;  Surgeon: Lollie Sails, MD;  Location: [facility] ENDOSCOPY;  Service: Endoscopy;  Laterality: N/A;  . ERCP N/A 04/30/2018   Procedure: ENDOSCOPIC RETROGRADE CHOLANGIOPANCREATOGRAPHY (ERCP);  Surgeon: Lucilla Lame, MD;  Location: [facility] ENDOSCOPY;  Service: Endoscopy;  Laterality: N/A;  . ERCP N/A 05/05/2018   Procedure: ENDOSCOPIC RETROGRADE CHOLANGIOPANCREATOGRAPHY (ERCP);  Surgeon: Lucilla Lame, MD;  Location: [facility] ENDOSCOPY;  Service: Endoscopy;  Laterality: N/A;  . PORTA CATH INSERTION N/A 06/28/2018   Procedure: PORTA CATH INSERTION;  Surgeon: Algernon Huxley, MD;  Location: [place] CV LAB;  Service: Cardiovascular;  Laterality: N/A;    Family History  Problem Relation Age of Onset  . Diabetes Mellitus I Other   . Alcoholism Other   . Hypertension Other   . Hyperlipidemia Other   . Coronary artery disease Other   . Stroke Other   .  Osteoarthritis Other   . Migraines Other   . Heart Problems Mother   . Stroke Sister   . CAD Brother   . Stroke Brother   . Breast cancer Neg Hx      Social History   Socioeconomic History  . Marital status: Married    Spouse name: Not on file  . Number of children: Not on file  . Years of education: Not on file  . Highest education level: Not on file  Occupational History  . Not on file  Social Needs  . Financial resource strain: Not on file  . Food insecurity:    Worry: Not on file    Inability: Not on file  . Transportation needs:    Medical: Not on file    Non-medical: Not on file  Tobacco Use  . Smoking status: Former Smoker    Last attempt to quit: 04/14/1988    Years since quitting: 30.5  . Smokeless tobacco: Never Used  Substance and Sexual Activity  . Alcohol use: Yes    Comment: social drinker  . Drug use: No  . Sexual activity: Not Currently  Lifestyle  . Physical activity:    Days per week: Not on file    Minutes per session: Not on file  . Stress: Not on file  Relationships  . Social connections:    Talks on phone: Not on file    Gets together: Not on file    Attends religious service: Not on file    Active member of club or organization: Not on file    Attends meetings of clubs or organizations: Not on file    Relationship status: Not on file  . Intimate partner violence:    Fear of current or ex partner: Not on file    Emotionally abused: Not on file    Physically abused: Not on file    Forced sexual activity: Not on file  Other Topics Concern  . Not on file  Social History Narrative  . Not on file    Current Outpatient Medications on File Prior to Visit  Medication Sig Dispense Refill  . chlorhexidine (PERIDEX) 0.12 % solution Use as directed 15 mLs in the mouth or throat 2 (two) times daily. 473 mL 3  . Cholecalciferol (VITAMIN D-3) 25 MCG (1000 UT) CAPS Take by mouth.    . dexamethasone (DECADRON) 4 MG tablet Take 1 tablet by mouth 1 day or 1 dose.    . levothyroxine (SYNTHROID, LEVOTHROID) 100 MCG tablet Take 1 tablet (100 mcg total) by mouth daily before breakfast. 90 tablet 4  .  lidocaine-prilocaine (EMLA) cream Apply 1 application topically as needed. 30 g 3  . loperamide (IMODIUM) 2 MG capsule Take 1 capsule (2 mg total) by mouth See admin instructions. With onset of loose stool, take 4mg  followed by 2mg  every 2 hours until 12 hours have passed without loose bowel movement. Maximum: 16 mg/day 120 capsule 1  . loratadine (CLARITIN) 10 MG tablet Take 10 mg by mouth daily.    . Magnesium Cl-Calcium Carbonate (SLOW-MAG PO) Take 2 tablets by mouth daily.    . megestrol (MEGACE) 40 MG tablet Take 1 tablet (40 mg total) by mouth 4 (four) times daily. 120 tablet 0  . omeprazole (PRILOSEC) 20 MG capsule Take 1 capsule (20 mg total) by mouth daily. 30 capsule 2  . ondansetron (ZOFRAN) 4 MG tablet Take 1 tablet (4 mg total) by mouth every 6 (six) hours as needed for nausea  or vomiting. 30 tablet 3  . potassium chloride SA (K-DUR) 20 MEQ tablet TAKE 2 TABLETS BY MOUTH DAILY 28 tablet 0  . prochlorperazine (COMPAZINE) 10 MG tablet Take 1 tablet (10 mg total) by mouth every 6 (six) hours as needed (NAUSEA). 30 tablet 1   No current facility-administered medications on file prior to visit.     No Known Allergies     Observations/Objective: There were no vitals filed for this visit. There is no height or weight on file to calculate BMI.   Pain level 0 Physical Exam  Constitutional: She is oriented to person, place, and time. No distress.  HENT:  Head: Normocephalic and atraumatic.  Pulmonary/Chest: Effort normal.  Neurological: She is alert and oriented to person, place, and time.  Psychiatric: Affect normal.    CBC    Component Value Date/Time   WBC 15.5 (H) 10/06/2018 0824   RBC 3.10 (L) 10/06/2018 0824   HGB 8.5 (L) 10/06/2018 0824   HGB 13.0 11/12/2017 0837   HCT 26.7 (L) 10/06/2018 0824   HCT 38.5 11/12/2017 0837   PLT 181 10/06/2018 0824   PLT 228 11/12/2017 0837   MCV 86.1 10/06/2018 0824   MCV 84 11/12/2017 0837   MCH 27.4 10/06/2018 0824   MCHC 31.8  10/06/2018 0824   RDW 25.0 (H) 10/06/2018 0824   RDW 14.9 11/12/2017 0837   LYMPHSABS 2.6 10/06/2018 0824   MONOABS 1.1 (H) 10/06/2018 0824   EOSABS 0.0 10/06/2018 0824   BASOSABS 0.1 10/06/2018 0824    CMP     Component Value Date/Time   NA 134 (L) 10/06/2018 0824   NA 137 04/21/2018 1046   K 4.2 10/06/2018 0824   CL 102 10/06/2018 0824   CO2 23 10/06/2018 0824   GLUCOSE 106 (H) 10/06/2018 0824   BUN 13 10/06/2018 0824   BUN 23 04/21/2018 1046   CREATININE 0.83 10/06/2018 0824   CALCIUM 9.7 10/06/2018 0824   PROT 6.8 10/06/2018 0824   PROT 7.3 05/10/2018 1429   ALBUMIN 3.3 (L) 10/06/2018 0824   ALBUMIN 3.8 05/10/2018 1429   AST 23 10/06/2018 0824   ALT 12 10/06/2018 0824   ALKPHOS 121 10/06/2018 0824   BILITOT 0.5 10/06/2018 0824   BILITOT 6.2 (H) 05/10/2018 1429   GFRNONAA >60 10/06/2018 0824   GFRAA >60 10/06/2018 0824     Assessment and Plan: 1. Malignant neoplasm of head of pancreas ([place])   2. Hyponatremia   3. Protein-calorie malnutrition, unspecified severity ([place])   4. Anemia due to antineoplastic chemotherapy   5. Thrombocytopenia ([place])     Patient appears tolerating modified FOLFIRINOX with mild to moderate side effects. Recommend continue supportive care.  Going to have lab-CBC CMP magnesium tomorrow and receive IV fluid hydration.  Depending on her labs, she will proceed with electrolyte supplements if needed.  Poor oral intake and weight loss, malnutrition, advised patient to follow Megace instruction to take 40 mg 4 times a day.  She voices understanding.  Continue Ensure supplementation.  Follow-up with dietitian. 10/13/2018 labs reviewed. Hyponatremia, sodium level 133.  Proceed with 1 L of IV normal saline on 10/13/2018. Hypokalemia, potassium 3.2.  She will proceed with IV potassium chloride 20 mEq over 1 hour on 10/13/2018.  Continue oral potassium chloride 40 mEq daily. Anemia, hemoglobin further decreased to 7.4, she did feel fatigued and tired.   Recommend patient to proceed with 1 unit of PRBC blood transfusion for symptomatic anemia.  She reports feeling better when she  is here for IV fluid infusion and does not want to have blood transfusion.  We will add hold tubes when she sees Dr. B next week for chemotherapy.  Possible blood transfusion at that time if needed.  Thrombocytopenia, platelet counts 46,000.  This is due to chemotherapy.  No active bleeding events.  Continue to monitor.  Follow Up Instructions:  IV fluid hydration 5/8, 5/11 10/20/2018  Dr. B for cycle 7 mFOLFIRINOX, 5/15 pump discontinue and IVF.  10/26/2018 Doximity visit with me and 5/20 lab/IV hydration 5/27 lab md chemotherpay cycle 8 mFOLFIRINOX   I discussed the assessment and treatment plan with the patient. The patient was provided an opportunity to ask questions and all were answered. The patient agreed with the plan and demonstrated an understanding of the instructions.  The patient was advised to call back or seek an in-person evaluation if the symptoms worsen or if the condition fails to improve as anticipated.    Earlie Server, MD 10/12/2018 9:52 PM

## 2018-10-12 NOTE — Progress Notes (Signed)
Patient stated that she had been doing well with no complaints. 

## 2018-10-13 ENCOUNTER — Inpatient Hospital Stay: Payer: Medicare Other

## 2018-10-13 ENCOUNTER — Inpatient Hospital Stay: Payer: Medicare Other | Admitting: Oncology

## 2018-10-13 ENCOUNTER — Other Ambulatory Visit: Payer: Self-pay

## 2018-10-13 ENCOUNTER — Inpatient Hospital Stay (HOSPITAL_BASED_OUTPATIENT_CLINIC_OR_DEPARTMENT_OTHER): Payer: Medicare Other | Admitting: Hospice and Palliative Medicine

## 2018-10-13 ENCOUNTER — Inpatient Hospital Stay: Payer: Medicare Other | Admitting: *Deleted

## 2018-10-13 VITALS — BP 121/78 | HR 97 | Temp 96.6°F | Resp 20

## 2018-10-13 DIAGNOSIS — T451X5S Adverse effect of antineoplastic and immunosuppressive drugs, sequela: Secondary | ICD-10-CM | POA: Diagnosis not present

## 2018-10-13 DIAGNOSIS — I10 Essential (primary) hypertension: Secondary | ICD-10-CM

## 2018-10-13 DIAGNOSIS — R5383 Other fatigue: Secondary | ICD-10-CM

## 2018-10-13 DIAGNOSIS — T451X5A Adverse effect of antineoplastic and immunosuppressive drugs, initial encounter: Secondary | ICD-10-CM

## 2018-10-13 DIAGNOSIS — C250 Malignant neoplasm of head of pancreas: Secondary | ICD-10-CM | POA: Diagnosis not present

## 2018-10-13 DIAGNOSIS — E876 Hypokalemia: Secondary | ICD-10-CM

## 2018-10-13 DIAGNOSIS — K219 Gastro-esophageal reflux disease without esophagitis: Secondary | ICD-10-CM

## 2018-10-13 DIAGNOSIS — D6481 Anemia due to antineoplastic chemotherapy: Secondary | ICD-10-CM

## 2018-10-13 DIAGNOSIS — Z79899 Other long term (current) drug therapy: Secondary | ICD-10-CM

## 2018-10-13 DIAGNOSIS — Z515 Encounter for palliative care: Secondary | ICD-10-CM | POA: Diagnosis not present

## 2018-10-13 DIAGNOSIS — D696 Thrombocytopenia, unspecified: Secondary | ICD-10-CM

## 2018-10-13 DIAGNOSIS — E46 Unspecified protein-calorie malnutrition: Secondary | ICD-10-CM

## 2018-10-13 DIAGNOSIS — E871 Hypo-osmolality and hyponatremia: Secondary | ICD-10-CM

## 2018-10-13 DIAGNOSIS — Z5111 Encounter for antineoplastic chemotherapy: Secondary | ICD-10-CM | POA: Diagnosis not present

## 2018-10-13 DIAGNOSIS — R7989 Other specified abnormal findings of blood chemistry: Secondary | ICD-10-CM

## 2018-10-13 DIAGNOSIS — E860 Dehydration: Secondary | ICD-10-CM

## 2018-10-13 DIAGNOSIS — R197 Diarrhea, unspecified: Secondary | ICD-10-CM

## 2018-10-13 DIAGNOSIS — Z95828 Presence of other vascular implants and grafts: Secondary | ICD-10-CM

## 2018-10-13 DIAGNOSIS — Z87891 Personal history of nicotine dependence: Secondary | ICD-10-CM

## 2018-10-13 DIAGNOSIS — R531 Weakness: Secondary | ICD-10-CM

## 2018-10-13 DIAGNOSIS — E039 Hypothyroidism, unspecified: Secondary | ICD-10-CM

## 2018-10-13 LAB — BASIC METABOLIC PANEL
Anion gap: 6 (ref 5–15)
BUN: 9 mg/dL (ref 8–23)
CO2: 25 mmol/L (ref 22–32)
Calcium: 9.1 mg/dL (ref 8.9–10.3)
Chloride: 102 mmol/L (ref 98–111)
Creatinine, Ser: 0.45 mg/dL (ref 0.44–1.00)
GFR calc Af Amer: >60 mL/min (ref >60)
GFR calc non Af Amer: >60 mL/min (ref >60)
Glucose, Bld: 110 mg/dL — ABNORMAL HIGH (ref 70–99)
Potassium: 3.2 mmol/L — ABNORMAL LOW (ref 3.5–5.1)
Sodium: 133 mmol/L — ABNORMAL LOW (ref 135–145)

## 2018-10-13 LAB — CBC WITH DIFFERENTIAL/PLATELET
Abs Immature Granulocytes: 0.07 10*3/uL (ref 0.00–0.07)
Basophils Absolute: 0 10*3/uL (ref 0.0–0.1)
Basophils Relative: 1 %
Eosinophils Absolute: 0 10*3/uL (ref 0.0–0.5)
Eosinophils Relative: 1 %
HCT: 23.5 % — ABNORMAL LOW (ref 36.0–46.0)
Hemoglobin: 7.4 g/dL — ABNORMAL LOW (ref 12.0–15.0)
Immature Granulocytes: 2 %
Lymphocytes Relative: 25 %
Lymphs Abs: 1 10*3/uL (ref 0.7–4.0)
MCH: 27.6 pg (ref 26.0–34.0)
MCHC: 31.5 g/dL (ref 30.0–36.0)
MCV: 87.7 fL (ref 80.0–100.0)
Monocytes Absolute: 0.2 10*3/uL (ref 0.1–1.0)
Monocytes Relative: 5 %
Neutro Abs: 2.7 10*3/uL (ref 1.7–7.7)
Neutrophils Relative %: 66 %
Platelets: 46 10*3/uL — ABNORMAL LOW (ref 150–400)
RBC: 2.68 MIL/uL — ABNORMAL LOW (ref 3.87–5.11)
RDW: 23.7 % — ABNORMAL HIGH (ref 11.5–15.5)
Smear Review: NORMAL
WBC: 4.1 10*3/uL (ref 4.0–10.5)
nRBC: 0 % (ref 0.0–0.2)

## 2018-10-13 LAB — MAGNESIUM: Magnesium: 1.9 mg/dL (ref 1.7–2.4)

## 2018-10-13 LAB — SAMPLE TO BLOOD BANK

## 2018-10-13 MED ORDER — SODIUM CHLORIDE 0.9 % IV SOLN
Freq: Once | INTRAVENOUS | Status: AC
  Administered 2018-10-13: 10:00:00 1000 mL via INTRAVENOUS
  Filled 2018-10-13: qty 250

## 2018-10-13 MED ORDER — POTASSIUM CHLORIDE 20 MEQ/100ML IV SOLN
20.0000 meq | Freq: Once | INTRAVENOUS | Status: AC
  Administered 2018-10-13: 20 meq via INTRAVENOUS

## 2018-10-13 MED ORDER — POTASSIUM CHLORIDE CRYS ER 20 MEQ PO TBCR: 40.0000 meq | EXTENDED_RELEASE_TABLET | Freq: Every day | ORAL | 0 refills | Status: DC

## 2018-10-13 MED ORDER — SODIUM CHLORIDE 0.9% FLUSH
10.0000 mL | Freq: Once | INTRAVENOUS | Status: AC | PRN
  Administered 2018-10-13: 10 mL
  Filled 2018-10-13: qty 10

## 2018-10-13 MED ORDER — SODIUM CHLORIDE 0.9% FLUSH
10.0000 mL | Freq: Once | INTRAVENOUS | Status: AC
  Administered 2018-10-13: 10 mL via INTRAVENOUS
  Filled 2018-10-13: qty 10

## 2018-10-13 MED ORDER — HEPARIN SOD (PORK) LOCK FLUSH 100 UNIT/ML IV SOLN
500.0000 [IU] | Freq: Once | INTRAVENOUS | Status: AC | PRN
  Administered 2018-10-13: 500 [IU]
  Filled 2018-10-13: qty 5

## 2018-10-13 NOTE — Progress Notes (Signed)
[facility]  Telephone:([phone] [phone]   Name: Lynn Taylor Date: 10/13/2018 MRN: [id number]  DOB: 1945-11-30  Patient Care Team: Jodelle Green, FNP as PCP - General (Family Medicine) Clent Jacks, RN as Registered Nurse    REASON FOR CONSULTATION: Palliative Care consult requested for this 73 y.o. female with multiple medical problems including stage IIB pancreatic cancer status post [place] on adjuvant FOLFIRINOX chemotherapy.  Patient has had weakness and poor oral intake.  She was referred to palliative care for support and to address goals.  SOCIAL HISTORY:     reports that she quit smoking about 30 years ago. She has never used smokeless tobacco. She reports current alcohol use. She reports that she does not use drugs.   Patient is married and lives at home with her husband, daughter, and granddaughter.  Patient also has 2 sons, 1 of whom lives nearby and the other in [place].  Patient retired from [employer] and then worked for several years in [employer].  ADVANCE DIRECTIVES:    CODE STATUS:   PAST MEDICAL HISTORY: Past Medical History:  Diagnosis Date  . Cancer ([place])   . GERD (gastroesophageal reflux disease)   . Hypertension   . Hypothyroidism   . Malignant neoplasm of head of pancreas ([place]) 06/08/2018    PAST SURGICAL HISTORY:  Past Surgical History:  Procedure Laterality Date  . CHOLECYSTECTOMY    . COLONOSCOPY N/A 12/02/2016   Procedure: COLONOSCOPY;  Surgeon: Lollie Sails, MD;  Location: [facility] ENDOSCOPY;  Service: Endoscopy;  Laterality: N/A;  . ERCP N/A 04/30/2018   Procedure: ENDOSCOPIC RETROGRADE CHOLANGIOPANCREATOGRAPHY (ERCP);  Surgeon: Lucilla Lame, MD;  Location: [facility] ENDOSCOPY;  Service: Endoscopy;  Laterality: N/A;  . ERCP N/A 05/05/2018   Procedure: ENDOSCOPIC RETROGRADE CHOLANGIOPANCREATOGRAPHY (ERCP);  Surgeon: Lucilla Lame, MD;  Location: [facility] ENDOSCOPY;  Service:  Endoscopy;  Laterality: N/A;  . PORTA CATH INSERTION N/A 06/28/2018   Procedure: PORTA CATH INSERTION;  Surgeon: Algernon Huxley, MD;  Location: [place] [facility] LAB;  Service: Cardiovascular;  Laterality: N/A;    HEMATOLOGY/ONCOLOGY HISTORY:    Malignant neoplasm of head of pancreas ([place])   06/08/2018 Initial Diagnosis    Malignant neoplasm of head of pancreas ([place])    07/13/2018 -  Chemotherapy    The patient had palonosetron (ALOXI) injection 0.25 mg, 0.25 mg, Intravenous,  Once, 6 of 12 cycles Administration: 0.25 mg (07/13/2018), 0.25 mg (09/22/2018), 0.25 mg (10/06/2018), 0.25 mg (08/03/2018), 0.25 mg (08/18/2018), 0.25 mg (09/01/2018) pegfilgrastim (NEULASTA ONPRO KIT) injection 6 mg, 6 mg, Subcutaneous, Once, 6 of 12 cycles Administration: 6 mg (07/15/2018), 6 mg (08/05/2018), 6 mg (09/24/2018), 6 mg (10/08/2018), 6 mg (08/20/2018), 6 mg (09/03/2018) irinotecan (CAMPTOSAR) 340 mg in dextrose 5 % 500 mL chemo infusion, 180 mg/m2 = 340 mg, Intravenous,  Once, 6 of 12 cycles Dose modification: 65 mg/m2 (original dose 180 mg/m2, Cycle 5, Reason: Dose not tolerated), 65 mg/m2 (original dose 180 mg/m2, Cycle 2, Reason: Dose not tolerated), 65 mg/m2 (original dose 180 mg/m2, Cycle 3, Reason: Dose not tolerated), 65 mg/m2 (original dose 180 mg/m2, Cycle 4, Reason: Dose not tolerated) Administration: 340 mg (07/13/2018), 120 mg (10/06/2018), 120 mg (08/03/2018), 120 mg (08/18/2018), 120 mg (09/01/2018) leucovorin 750 mg in dextrose 5 % 250 mL infusion, 752 mg, Intravenous,  Once, 6 of 12 cycles Administration: 750 mg (07/13/2018), 650 mg (10/06/2018), 750 mg (08/03/2018), 750 mg (08/18/2018), 750 mg (09/01/2018) oxaliplatin (ELOXATIN) 150 mg  in dextrose 5 % 500 mL chemo infusion, 160 mg, Intravenous,  Once, 6 of 12 cycles Dose modification: 80 mg/m2 (original dose 85 mg/m2, Cycle 5, Reason: Provider Judgment), 75 mg/m2 (original dose 85 mg/m2, Cycle 2, Reason: Dose not tolerated), 80 mg/m2 (original dose 85 mg/m2, Cycle 4,  Reason: Dose not tolerated) Administration: 150 mg (07/13/2018), 135 mg (09/22/2018), 135 mg (10/06/2018), 135 mg (08/03/2018), 150 mg (08/18/2018), 150 mg (09/01/2018) fluorouracil (ADRUCIL) chemo injection 750 mg, 400 mg/m2 = 750 mg, Intravenous,  Once, 4 of 4 cycles Dose modification: 200 mg/m2 (original dose 400 mg/m2, Cycle 2, Reason: Dose not tolerated) Administration: 750 mg (07/13/2018), 350 mg (08/03/2018), 750 mg (08/18/2018), 700 mg (09/01/2018) fluorouracil (ADRUCIL) 4,500 mg in sodium chloride 0.9 % 60 mL chemo infusion, 2,400 mg/m2 = 4,500 mg, Intravenous, 1 Day/Dose, 6 of 12 cycles Dose modification: 1,200 mg/m2 (original dose 2,400 mg/m2, Cycle 2, Reason: Dose not tolerated) Administration: 4,500 mg (07/13/2018), 4,150 mg (09/22/2018), 4,150 mg (10/06/2018), 2,250 mg (08/03/2018), 4,500 mg (08/18/2018), 4,150 mg (09/01/2018)  for chemotherapy treatment.      ALLERGIES:  has No Known Allergies.  MEDICATIONS:  Current Outpatient Medications  Medication Sig Dispense Refill  . chlorhexidine (PERIDEX) 0.12 % solution Use as directed 15 mLs in the mouth or throat 2 (two) times daily. 473 mL 3  . Cholecalciferol (VITAMIN D-3) 25 MCG (1000 UT) CAPS Take by mouth.    . dexamethasone (DECADRON) 4 MG tablet Take 1 tablet by mouth 1 day or 1 dose.    . levothyroxine (SYNTHROID, LEVOTHROID) 100 MCG tablet Take 1 tablet (100 mcg total) by mouth daily before breakfast. 90 tablet 4  . lidocaine-prilocaine (EMLA) cream Apply 1 application topically as needed. 30 g 3  . loperamide (IMODIUM) 2 MG capsule Take 1 capsule (2 mg total) by mouth See admin instructions. With onset of loose stool, take '4mg'$  followed by '2mg'$  every 2 hours until 12 hours have passed without loose bowel movement. Maximum: 16 mg/day 120 capsule 1  . loratadine (CLARITIN) 10 MG tablet Take 10 mg by mouth daily.    . Magnesium Cl-Calcium Carbonate (SLOW-MAG PO) Take 2 tablets by mouth daily.    . megestrol (MEGACE) 40 MG tablet Take 1 tablet  (40 mg total) by mouth 4 (four) times daily. 120 tablet 0  . nystatin (MYCOSTATIN) 100000 UNIT/ML suspension Take 5 mLs (500,000 Units total) by mouth 4 (four) times daily. 473 mL 1  . omeprazole (PRILOSEC) 20 MG capsule Take 1 capsule (20 mg total) by mouth daily. 30 capsule 2  . ondansetron (ZOFRAN) 4 MG tablet Take 1 tablet (4 mg total) by mouth every 6 (six) hours as needed for nausea or vomiting. 30 tablet 3  . potassium chloride SA (K-DUR) 20 MEQ tablet TAKE 2 TABLETS BY MOUTH DAILY 28 tablet 0  . prochlorperazine (COMPAZINE) 10 MG tablet Take 1 tablet (10 mg total) by mouth every 6 (six) hours as needed (NAUSEA). 30 tablet 1   No current facility-administered medications for this visit.     VITAL SIGNS: There were no vitals taken for this visit. There were no vitals filed for this visit.  Estimated body mass index is 25.19 kg/m as calculated from the following:   Height as of 10/06/18: '5\' 2"'$  (1.575 m).   Weight as of 10/06/18: 137 lb 11.2 oz (62.5 kg).  LABS: CBC:    Component Value Date/Time   WBC 4.1 10/13/2018 0851   HGB 7.4 (L) 10/13/2018 0851   HGB 13.0  11/12/2017 0837   HCT 23.5 (L) 10/13/2018 0851   HCT 38.5 11/12/2017 0837   PLT 46 (L) 10/13/2018 0851   PLT 228 11/12/2017 0837   MCV 87.7 10/13/2018 0851   MCV 84 11/12/2017 0837   NEUTROABS 2.7 10/13/2018 0851   LYMPHSABS 1.0 10/13/2018 0851   MONOABS 0.2 10/13/2018 0851   EOSABS 0.0 10/13/2018 0851   BASOSABS 0.0 10/13/2018 0851   Comprehensive Metabolic Panel:    Component Value Date/Time   NA 133 (L) 10/13/2018 0851   NA 137 04/21/2018 1046   K 3.2 (L) 10/13/2018 0851   CL 102 10/13/2018 0851   CO2 25 10/13/2018 0851   BUN 9 10/13/2018 0851   BUN 23 04/21/2018 1046   CREATININE 0.45 10/13/2018 0851   GLUCOSE 110 (H) 10/13/2018 0851   CALCIUM 9.1 10/13/2018 0851   AST 23 10/06/2018 0824   ALT 12 10/06/2018 0824   ALKPHOS 121 10/06/2018 0824   BILITOT 0.5 10/06/2018 0824   BILITOT 6.2 (H) 05/10/2018  1429   PROT 6.8 10/06/2018 0824   PROT 7.3 05/10/2018 1429   ALBUMIN 3.3 (L) 10/06/2018 0824   ALBUMIN 3.8 05/10/2018 1429    RADIOGRAPHIC STUDIES: No results found.  PERFORMANCE STATUS (ECOG) : 2 - Symptomatic, <50% confined to bed  Review of Systems Unless otherwise noted, a complete review of systems is negative.  Physical Exam General: NAD, frail appearing, thin Pulmonary: Unlabored Extremities: no edema Skin: no rashes Neurological: Weakness but otherwise nonfocal  IMPRESSION: I met with patient today while she was in the infusion area receiving treatment.    Patient reports feeling better over the past several days.  She denies any acute changes or concerns.  Patient feels like her appetite is slightly improved.  She has had significant weight loss over the past 2 months.  We again discussed strategies for increasing caloric intake.  I encouraged that she increase oral supplements to 2-3 times a day.  Patient says that her appetite has been somewhat limited by frequent nausea.  Encouraged her to try antiemetics an hour so before meals.  Patient might also try scheduled ondansetron every 6 hours.  We will plan follow-up when patient can be seen in exam room to discuss in more detail her goals and advance care planning.  PLAN: -Continue current scope of treatment -Consider scheduled ondansetron -Increase oral supplements to 2-3 times a day -RTC in 2-3 weeks   Patient expressed understanding and was in agreement with this plan. She also understands that She can call the clinic at any time with any questions, concerns, or complaints.     Time Total: 15 minutes  Visit consisted of counseling and education dealing with the complex and emotionally intense issues of symptom management and palliative care in the setting of serious and potentially life-threatening illness.Greater than 50%  of this time was spent counseling and coordinating care related to the above  assessment and plan.  Signed by: Altha Harm, PhD, NP-C [phone] (Work Cell)

## 2018-10-13 NOTE — Progress Notes (Signed)
Patient states, "I feel better today. I'm not having any nausea or vomiting. I have not had any diarrhea today, but I do have it sometimes. I am eating and drinking." Patient was specifically asked the question of whether her fatigue was better today? Patient stated, "Yes, so far." Lab results back and reviewed with MD, Dr. Tasia Catchings. Per MD order: release orders from supportive therapy plan for 0.9% Sodium Chloride infusion at 973ml/hr for one hour for a total of 1L, and Potassium Chloride 22mEq in 137ml over one hour, and administer both today.

## 2018-10-15 ENCOUNTER — Other Ambulatory Visit: Payer: Self-pay

## 2018-10-15 ENCOUNTER — Inpatient Hospital Stay: Payer: Medicare Other

## 2018-10-15 VITALS — BP 125/80 | HR 98 | Temp 97.5°F | Resp 18

## 2018-10-15 DIAGNOSIS — E871 Hypo-osmolality and hyponatremia: Secondary | ICD-10-CM

## 2018-10-15 DIAGNOSIS — Z5111 Encounter for antineoplastic chemotherapy: Secondary | ICD-10-CM | POA: Diagnosis not present

## 2018-10-15 MED ORDER — SODIUM CHLORIDE 0.9 % IV SOLN
Freq: Once | INTRAVENOUS | Status: AC
  Administered 2018-10-15: 11:00:00 via INTRAVENOUS
  Filled 2018-10-15: qty 250

## 2018-10-15 MED ORDER — HEPARIN SOD (PORK) LOCK FLUSH 100 UNIT/ML IV SOLN
500.0000 [IU] | Freq: Once | INTRAVENOUS | Status: AC | PRN
  Administered 2018-10-15: 500 [IU]

## 2018-10-18 ENCOUNTER — Inpatient Hospital Stay: Payer: Medicare Other

## 2018-10-18 ENCOUNTER — Other Ambulatory Visit: Payer: Self-pay | Admitting: *Deleted

## 2018-10-18 ENCOUNTER — Other Ambulatory Visit: Payer: Self-pay | Admitting: Oncology

## 2018-10-18 ENCOUNTER — Other Ambulatory Visit: Payer: Self-pay

## 2018-10-18 VITALS — BP 103/65 | HR 87 | Resp 18

## 2018-10-18 DIAGNOSIS — Z5111 Encounter for antineoplastic chemotherapy: Secondary | ICD-10-CM | POA: Diagnosis not present

## 2018-10-18 DIAGNOSIS — C250 Malignant neoplasm of head of pancreas: Secondary | ICD-10-CM

## 2018-10-18 DIAGNOSIS — E871 Hypo-osmolality and hyponatremia: Secondary | ICD-10-CM

## 2018-10-18 LAB — COMPREHENSIVE METABOLIC PANEL
ALT: 11 U/L (ref 0–44)
AST: 23 U/L (ref 15–41)
Albumin: 3.2 g/dL — ABNORMAL LOW (ref 3.5–5.0)
Alkaline Phosphatase: 112 U/L (ref 38–126)
Anion gap: 12 (ref 5–15)
BUN: <5 mg/dL — ABNORMAL LOW (ref 8–23)
CO2: 24 mmol/L (ref 22–32)
Calcium: 9.5 mg/dL (ref 8.9–10.3)
Chloride: 98 mmol/L (ref 98–111)
Creatinine, Ser: 0.62 mg/dL (ref 0.44–1.00)
GFR calc Af Amer: >60 mL/min (ref >60)
GFR calc non Af Amer: >60 mL/min (ref >60)
Glucose, Bld: 99 mg/dL (ref 70–99)
Potassium: 2.4 mmol/L — CL (ref 3.5–5.1)
Sodium: 134 mmol/L — ABNORMAL LOW (ref 135–145)
Total Bilirubin: 0.7 mg/dL (ref 0.3–1.2)
Total Protein: 6.6 g/dL (ref 6.5–8.1)

## 2018-10-18 LAB — MAGNESIUM: Magnesium: 1.7 mg/dL (ref 1.7–2.4)

## 2018-10-18 MED ORDER — HEPARIN SOD (PORK) LOCK FLUSH 100 UNIT/ML IV SOLN
500.0000 [IU] | Freq: Once | INTRAVENOUS | Status: AC
  Administered 2018-10-18: 500 [IU] via INTRAVENOUS
  Filled 2018-10-18: qty 5

## 2018-10-18 MED ORDER — SODIUM CHLORIDE 0.9% FLUSH
10.0000 mL | Freq: Once | INTRAVENOUS | Status: AC
  Administered 2018-10-18: 10 mL via INTRAVENOUS
  Filled 2018-10-18: qty 10

## 2018-10-18 MED ORDER — SODIUM CHLORIDE 0.9 % IV SOLN
40.0000 meq | Freq: Once | INTRAVENOUS | Status: AC
  Administered 2018-10-18: 40 meq via INTRAVENOUS
  Filled 2018-10-18: qty 20

## 2018-10-18 MED ORDER — SODIUM CHLORIDE 0.9 % IV SOLN
Freq: Once | INTRAVENOUS | Status: AC
  Administered 2018-10-18: 11:00:00 via INTRAVENOUS
  Filled 2018-10-18: qty 250

## 2018-10-18 MED ORDER — SODIUM CHLORIDE 0.9 % IV SOLN
INTRAVENOUS | Status: DC
  Administered 2018-10-18: 12:00:00 via INTRAVENOUS
  Filled 2018-10-18: qty 250

## 2018-10-18 MED ORDER — POTASSIUM CHLORIDE CRYS ER 10 MEQ PO TBCR
20.0000 meq | EXTENDED_RELEASE_TABLET | Freq: Two times a day (BID) | ORAL | Status: DC
  Administered 2018-10-18: 20 meq via ORAL
  Filled 2018-10-18: qty 2

## 2018-10-20 ENCOUNTER — Inpatient Hospital Stay: Payer: Medicare Other

## 2018-10-20 ENCOUNTER — Other Ambulatory Visit: Payer: Self-pay

## 2018-10-20 ENCOUNTER — Inpatient Hospital Stay (HOSPITAL_BASED_OUTPATIENT_CLINIC_OR_DEPARTMENT_OTHER): Payer: Medicare Other | Admitting: Internal Medicine

## 2018-10-20 VITALS — BP 113/74 | HR 118 | Temp 96.6°F | Resp 18 | Ht 62.0 in | Wt 135.0 lb

## 2018-10-20 DIAGNOSIS — E46 Unspecified protein-calorie malnutrition: Secondary | ICD-10-CM

## 2018-10-20 DIAGNOSIS — I10 Essential (primary) hypertension: Secondary | ICD-10-CM

## 2018-10-20 DIAGNOSIS — R7989 Other specified abnormal findings of blood chemistry: Secondary | ICD-10-CM

## 2018-10-20 DIAGNOSIS — Z79899 Other long term (current) drug therapy: Secondary | ICD-10-CM

## 2018-10-20 DIAGNOSIS — C250 Malignant neoplasm of head of pancreas: Secondary | ICD-10-CM

## 2018-10-20 DIAGNOSIS — R197 Diarrhea, unspecified: Secondary | ICD-10-CM

## 2018-10-20 DIAGNOSIS — D696 Thrombocytopenia, unspecified: Secondary | ICD-10-CM

## 2018-10-20 DIAGNOSIS — D6481 Anemia due to antineoplastic chemotherapy: Secondary | ICD-10-CM

## 2018-10-20 DIAGNOSIS — E871 Hypo-osmolality and hyponatremia: Secondary | ICD-10-CM

## 2018-10-20 DIAGNOSIS — Z5111 Encounter for antineoplastic chemotherapy: Secondary | ICD-10-CM | POA: Diagnosis not present

## 2018-10-20 DIAGNOSIS — G620 Drug-induced polyneuropathy: Secondary | ICD-10-CM

## 2018-10-20 DIAGNOSIS — T451X5S Adverse effect of antineoplastic and immunosuppressive drugs, sequela: Secondary | ICD-10-CM

## 2018-10-20 DIAGNOSIS — R531 Weakness: Secondary | ICD-10-CM

## 2018-10-20 DIAGNOSIS — Z87891 Personal history of nicotine dependence: Secondary | ICD-10-CM

## 2018-10-20 DIAGNOSIS — E860 Dehydration: Secondary | ICD-10-CM

## 2018-10-20 DIAGNOSIS — K219 Gastro-esophageal reflux disease without esophagitis: Secondary | ICD-10-CM

## 2018-10-20 DIAGNOSIS — R5383 Other fatigue: Secondary | ICD-10-CM

## 2018-10-20 DIAGNOSIS — E039 Hypothyroidism, unspecified: Secondary | ICD-10-CM

## 2018-10-20 DIAGNOSIS — E876 Hypokalemia: Secondary | ICD-10-CM

## 2018-10-20 DIAGNOSIS — R5381 Other malaise: Secondary | ICD-10-CM

## 2018-10-20 LAB — CBC WITH DIFFERENTIAL/PLATELET
Abs Immature Granulocytes: 0.54 10*3/uL — ABNORMAL HIGH (ref 0.00–0.07)
Basophils Absolute: 0.1 10*3/uL (ref 0.0–0.1)
Basophils Relative: 0 %
Eosinophils Absolute: 0 10*3/uL (ref 0.0–0.5)
Eosinophils Relative: 0 %
HCT: 26.2 % — ABNORMAL LOW (ref 36.0–46.0)
Hemoglobin: 8.1 g/dL — ABNORMAL LOW (ref 12.0–15.0)
Immature Granulocytes: 3 %
Lymphocytes Relative: 16 %
Lymphs Abs: 2.6 10*3/uL (ref 0.7–4.0)
MCH: 27.8 pg (ref 26.0–34.0)
MCHC: 30.9 g/dL (ref 30.0–36.0)
MCV: 90 fL (ref 80.0–100.0)
Monocytes Absolute: 1.1 10*3/uL — ABNORMAL HIGH (ref 0.1–1.0)
Monocytes Relative: 7 %
Neutro Abs: 11.7 10*3/uL — ABNORMAL HIGH (ref 1.7–7.7)
Neutrophils Relative %: 74 %
Platelets: 98 10*3/uL — ABNORMAL LOW (ref 150–400)
RBC: 2.91 MIL/uL — ABNORMAL LOW (ref 3.87–5.11)
RDW: 24.9 % — ABNORMAL HIGH (ref 11.5–15.5)
WBC: 15.9 10*3/uL — ABNORMAL HIGH (ref 4.0–10.5)
nRBC: 0.2 % (ref 0.0–0.2)

## 2018-10-20 LAB — COMPREHENSIVE METABOLIC PANEL
ALT: 12 U/L (ref 0–44)
AST: 24 U/L (ref 15–41)
Albumin: 3.1 g/dL — ABNORMAL LOW (ref 3.5–5.0)
Alkaline Phosphatase: 123 U/L (ref 38–126)
Anion gap: 10 (ref 5–15)
BUN: 8 mg/dL (ref 8–23)
CO2: 22 mmol/L (ref 22–32)
Calcium: 9.5 mg/dL (ref 8.9–10.3)
Chloride: 104 mmol/L (ref 98–111)
Creatinine, Ser: 0.62 mg/dL (ref 0.44–1.00)
GFR calc Af Amer: >60 mL/min (ref >60)
GFR calc non Af Amer: >60 mL/min (ref >60)
Glucose, Bld: 113 mg/dL — ABNORMAL HIGH (ref 70–99)
Potassium: 3.5 mmol/L (ref 3.5–5.1)
Sodium: 136 mmol/L (ref 135–145)
Total Bilirubin: 0.6 mg/dL (ref 0.3–1.2)
Total Protein: 6.3 g/dL — ABNORMAL LOW (ref 6.5–8.1)

## 2018-10-20 LAB — SAMPLE TO BLOOD BANK

## 2018-10-20 LAB — MAGNESIUM: Magnesium: 1.7 mg/dL (ref 1.7–2.4)

## 2018-10-20 MED ORDER — OXALIPLATIN CHEMO INJECTION 100 MG/20ML
135.0000 mg | Freq: Once | INTRAVENOUS | Status: AC
  Administered 2018-10-20: 135 mg via INTRAVENOUS
  Filled 2018-10-20: qty 20

## 2018-10-20 MED ORDER — IRINOTECAN HCL CHEMO INJECTION 100 MG/5ML
100.0000 mg | Freq: Once | INTRAVENOUS | Status: AC
  Administered 2018-10-20: 100 mg via INTRAVENOUS
  Filled 2018-10-20: qty 5

## 2018-10-20 MED ORDER — DEXAMETHASONE SODIUM PHOSPHATE 10 MG/ML IJ SOLN
10.0000 mg | Freq: Once | INTRAMUSCULAR | Status: AC
  Administered 2018-10-20: 10 mg via INTRAVENOUS
  Filled 2018-10-20: qty 1

## 2018-10-20 MED ORDER — LEUCOVORIN CALCIUM INJECTION 350 MG
650.0000 mg | Freq: Once | INTRAVENOUS | Status: AC
  Administered 2018-10-20: 650 mg via INTRAVENOUS
  Filled 2018-10-20: qty 7.5

## 2018-10-20 MED ORDER — SODIUM CHLORIDE 0.9% FLUSH
10.0000 mL | INTRAVENOUS | Status: DC | PRN
  Administered 2018-10-20: 10 mL via INTRAVENOUS
  Filled 2018-10-20: qty 10

## 2018-10-20 MED ORDER — SODIUM CHLORIDE 0.9 % IV SOLN
Freq: Once | INTRAVENOUS | Status: AC
  Administered 2018-10-20: 11:00:00 via INTRAVENOUS
  Filled 2018-10-20: qty 250

## 2018-10-20 MED ORDER — HEPARIN SOD (PORK) LOCK FLUSH 100 UNIT/ML IV SOLN: 500.0000 [IU] | Freq: Once | INTRAVENOUS | Status: DC

## 2018-10-20 MED ORDER — ATROPINE SULFATE 1 MG/ML IJ SOLN
0.5000 mg | Freq: Once | INTRAMUSCULAR | Status: AC | PRN
  Administered 2018-10-20: 0.5 mg via INTRAVENOUS
  Filled 2018-10-20: qty 1

## 2018-10-20 MED ORDER — SODIUM CHLORIDE 0.9 % IV SOLN
4150.0000 mg | INTRAVENOUS | Status: DC
  Administered 2018-10-20: 16:00:00 4150 mg via INTRAVENOUS
  Filled 2018-10-20: qty 83

## 2018-10-20 MED ORDER — DEXTROSE 5 % IV SOLN
Freq: Once | INTRAVENOUS | Status: AC
  Administered 2018-10-20: 11:00:00 via INTRAVENOUS
  Filled 2018-10-20: qty 250

## 2018-10-20 MED ORDER — PALONOSETRON HCL INJECTION 0.25 MG/5ML
0.2500 mg | Freq: Once | INTRAVENOUS | Status: AC
  Administered 2018-10-20: 0.25 mg via INTRAVENOUS
  Filled 2018-10-20: qty 5

## 2018-10-20 NOTE — Progress Notes (Signed)
[place] OFFICE PROGRESS NOTE  Patient Care Team: Jodelle Green, FNP as PCP - General (Family Medicine) Clent Jacks, RN as Registered Nurse  Cancer Staging No matching staging information was found for the patient.     Malignant neoplasm of head of pancreas ([place])   06/08/2018 Initial Diagnosis    Malignant neoplasm of head of pancreas ([place])    07/13/2018 -  Chemotherapy    The patient had palonosetron (ALOXI) injection 0.25 mg, 0.25 mg, Intravenous,  Once, 6 of 12 cycles Administration: 0.25 mg (07/13/2018), 0.25 mg (09/22/2018), 0.25 mg (10/06/2018), 0.25 mg (08/03/2018), 0.25 mg (08/18/2018), 0.25 mg (09/01/2018) pegfilgrastim (NEULASTA ONPRO KIT) injection 6 mg, 6 mg, Subcutaneous, Once, 6 of 12 cycles Administration: 6 mg (07/15/2018), 6 mg (08/05/2018), 6 mg (09/24/2018), 6 mg (10/08/2018), 6 mg (08/20/2018), 6 mg (09/03/2018) irinotecan (CAMPTOSAR) 340 mg in dextrose 5 % 500 mL chemo infusion, 180 mg/m2 = 340 mg, Intravenous,  Once, 6 of 12 cycles Dose modification: 65 mg/m2 (original dose 180 mg/m2, Cycle 5, Reason: Dose not tolerated), 65 mg/m2 (original dose 180 mg/m2, Cycle 2, Reason: Dose not tolerated), 65 mg/m2 (original dose 180 mg/m2, Cycle 3, Reason: Dose not tolerated), 65 mg/m2 (original dose 180 mg/m2, Cycle 4, Reason: Dose not tolerated) Administration: 340 mg (07/13/2018), 120 mg (10/06/2018), 120 mg (08/03/2018), 120 mg (08/18/2018), 120 mg (09/01/2018) leucovorin 750 mg in dextrose 5 % 250 mL infusion, 752 mg, Intravenous,  Once, 6 of 12 cycles Administration: 750 mg (07/13/2018), 650 mg (10/06/2018), 750 mg (08/03/2018), 750 mg (08/18/2018), 750 mg (09/01/2018) oxaliplatin (ELOXATIN) 150 mg in dextrose 5 % 500 mL chemo infusion, 160 mg, Intravenous,  Once, 6 of 12 cycles Dose modification: 80 mg/m2 (original dose 85 mg/m2, Cycle 5, Reason: Provider Judgment), 75 mg/m2 (original dose 85 mg/m2, Cycle 2, Reason: Dose not tolerated), 80 mg/m2 (original dose 85 mg/m2, Cycle  4, Reason: Dose not tolerated) Administration: 150 mg (07/13/2018), 135 mg (09/22/2018), 135 mg (10/06/2018), 135 mg (08/03/2018), 150 mg (08/18/2018), 150 mg (09/01/2018) fluorouracil (ADRUCIL) chemo injection 750 mg, 400 mg/m2 = 750 mg, Intravenous,  Once, 4 of 4 cycles Dose modification: 200 mg/m2 (original dose 400 mg/m2, Cycle 2, Reason: Dose not tolerated) Administration: 750 mg (07/13/2018), 350 mg (08/03/2018), 750 mg (08/18/2018), 700 mg (09/01/2018) fluorouracil (ADRUCIL) 4,500 mg in sodium chloride 0.9 % 60 mL chemo infusion, 2,400 mg/m2 = 4,500 mg, Intravenous, 1 Day/Dose, 6 of 12 cycles Dose modification: 1,200 mg/m2 (original dose 2,400 mg/m2, Cycle 2, Reason: Dose not tolerated) Administration: 4,500 mg (07/13/2018), 4,150 mg (09/22/2018), 4,150 mg (10/06/2018), 2,250 mg (08/03/2018), 4,500 mg (08/18/2018), 4,150 mg (09/01/2018)  for chemotherapy treatment.       INTERVAL HISTORY:  Lynn Taylor 73 y.o.  female pleasant patient above history of stage II pancreatic adenocarcinoma currently on adjuvant FOLFIRINOX chemotherapy.  Patient continues to IV fluids almost twice a week; and also needed potassium supplementation last week.  Her appetite is improving.  Intermittent diarrhea.  Currently improved.  Continues take Imodium Lomotil as needed.  Complains of fatigue.  Complains of intermittent tingling and numbness not any worse.  Mild tingling and numbness. Review of Systems  Constitutional: Positive for malaise/fatigue and weight loss. Negative for chills, diaphoresis and fever.  HENT: Negative for nosebleeds and sore throat.   Eyes: Negative for double vision.  Respiratory: Negative for cough, hemoptysis, sputum production, shortness of breath and wheezing.   Cardiovascular: Negative for chest pain, palpitations, orthopnea and leg swelling.  Gastrointestinal: Positive  for diarrhea and nausea. Negative for abdominal pain, blood in stool, constipation, heartburn, melena and vomiting.   Genitourinary: Negative for dysuria, frequency and urgency.  Musculoskeletal: Negative for back pain and joint pain.  Skin: Negative.  Negative for itching and rash.  Neurological: Positive for tingling. Negative for dizziness, focal weakness, weakness and headaches.  Endo/Heme/Allergies: Does not bruise/bleed easily.  Psychiatric/Behavioral: Negative for depression. The patient is not nervous/anxious and does not have insomnia.       PAST MEDICAL HISTORY :  Past Medical History:  Diagnosis Date  . Cancer ([place])   . GERD (gastroesophageal reflux disease)   . Hypertension   . Hypothyroidism   . Malignant neoplasm of head of pancreas ([place]) 06/08/2018    PAST SURGICAL HISTORY :   Past Surgical History:  Procedure Laterality Date  . CHOLECYSTECTOMY    . COLONOSCOPY N/A 12/02/2016   Procedure: COLONOSCOPY;  Surgeon: Lollie Sails, MD;  Location: [facility] ENDOSCOPY;  Service: Endoscopy;  Laterality: N/A;  . ERCP N/A 04/30/2018   Procedure: ENDOSCOPIC RETROGRADE CHOLANGIOPANCREATOGRAPHY (ERCP);  Surgeon: Lucilla Lame, MD;  Location[facility] ENDOSCOPY;  Service: Endoscopy;  Laterality: N/A;  . ERCP N/A 05/05/2018   Procedure: ENDOSCOPIC RETROGRADE CHOLANGIOPANCREATOGRAPHY (ERCP);  Surgeon: Lucilla Lame, MD;  Location: [facility] ENDOSCOPY;  Service: Endoscopy;  Laterality: N/A;  . PORTA CATH INSERTION N/A 06/28/2018   Procedure: PORTA CATH INSERTION;  Surgeon: Algernon Huxley, MD;  Location: [facility] CV LAB;  Service: Cardiovascular;  Laterality: N/A;    FAMILY HISTORY :   Family History  Problem Relation Age of Onset  . Diabetes Mellitus I Other   . Alcoholism Other   . Hypertension Other   . Hyperlipidemia Other   . Coronary artery disease Other   . Stroke Other   . Osteoarthritis Other   . Migraines Other   . Heart Problems Mother   . Stroke Sister   . CAD Brother   . Stroke Brother   . Breast cancer Neg Hx     SOCIAL HISTORY:   Social History   Tobacco Use  . Smoking  status: Former Smoker    Last attempt to quit: 04/14/1988    Years since quitting: 30.5  . Smokeless tobacco: Never Used  Substance Use Topics  . Alcohol use: Yes    Comment: social drinker  . Drug use: No    ALLERGIES:  has No Known Allergies.  MEDICATIONS:  Current Outpatient Medications  Medication Sig Dispense Refill  . chlorhexidine (PERIDEX) 0.12 % solution Use as directed 15 mLs in the mouth or throat 2 (two) times daily. 473 mL 3  . Cholecalciferol (VITAMIN D-3) 25 MCG (1000 UT) CAPS Take by mouth.    . dexamethasone (DECADRON) 4 MG tablet Take 1 tablet by mouth 1 day or 1 dose.    . levothyroxine (SYNTHROID, LEVOTHROID) 100 MCG tablet Take 1 tablet (100 mcg total) by mouth daily before breakfast. 90 tablet 4  . lidocaine-prilocaine (EMLA) cream Apply 1 application topically as needed. 30 g 3  . loperamide (IMODIUM) 2 MG capsule Take 1 capsule (2 mg total) by mouth See admin instructions. With onset of loose stool, take '4mg'$  followed by '2mg'$  every 2 hours until 12 hours have passed without loose bowel movement. Maximum: 16 mg/day 120 capsule 1  . loratadine (CLARITIN) 10 MG tablet Take 10 mg by mouth daily.    . Magnesium Cl-Calcium Carbonate (SLOW-MAG PO) Take 2 tablets by mouth daily.    . megestrol (MEGACE) 40 MG  tablet Take 1 tablet (40 mg total) by mouth 4 (four) times daily. 120 tablet 0  . nystatin (MYCOSTATIN) 100000 UNIT/ML suspension Take 5 mLs (500,000 Units total) by mouth 4 (four) times daily. 473 mL 1  . omeprazole (PRILOSEC) 20 MG capsule Take 1 capsule by mouth once daily 90 capsule 0  . ondansetron (ZOFRAN) 4 MG tablet Take 1 tablet (4 mg total) by mouth every 6 (six) hours as needed for nausea or vomiting. 30 tablet 3  . potassium chloride SA (K-DUR) 20 MEQ tablet Take 2 tablets (40 mEq total) by mouth daily. 28 tablet 0  . prochlorperazine (COMPAZINE) 10 MG tablet Take 1 tablet (10 mg total) by mouth every 6 (six) hours as needed (NAUSEA). 30 tablet 1   No  current facility-administered medications for this visit.    Facility-Administered Medications Ordered in Other Visits  Medication Dose Route Frequency Provider Last Rate Last Dose  . heparin lock flush 100 unit/mL  500 Units Intravenous Once Earlie Server, MD      . sodium chloride flush (NS) 0.9 % injection 10 mL  10 mL Intravenous PRN Earlie Server, MD   10 mL at 10/20/18 0904    PHYSICAL EXAMINATION: ECOG PERFORMANCE STATUS: 0 - Asymptomatic  BP 113/74   Pulse (!) 118   Temp (!) 96.6 F (35.9 C) (Tympanic)   Resp 18   Ht '5\' 2"'$  (1.575 m)   Wt 135 lb (61.2 kg)   BMI 24.69 kg/m   Filed Weights   10/20/18 0939  Weight: 135 lb (61.2 kg)    Physical Exam  Constitutional: She is oriented to person, place, and time and well-developed, well-nourished, and in no distress.  Thin built female patient resting in a wheelchair.  She is alone.  HENT:  Head: Normocephalic and atraumatic.  Mouth/Throat: Oropharynx is clear and moist. No oropharyngeal exudate.  Eyes: Pupils are equal, round, and reactive to light.  Neck: Normal range of motion. Neck supple.  Cardiovascular: Normal rate and regular rhythm.  Pulmonary/Chest: Breath sounds normal. No respiratory distress. She has no wheezes.  Abdominal: Soft. Bowel sounds are normal. She exhibits no distension and no mass. There is no abdominal tenderness. There is no rebound and no guarding.  Musculoskeletal: Normal range of motion.        General: No tenderness or edema.  Neurological: She is alert and oriented to person, place, and time.  Skin: Skin is warm.  Psychiatric: Affect normal.    LABORATORY DATA:  I have reviewed the data as listed    Component Value Date/Time   NA 136 10/20/2018 0904   NA 137 04/21/2018 1046   K 3.5 10/20/2018 0904   CL 104 10/20/2018 0904   CO2 22 10/20/2018 0904   GLUCOSE 113 (H) 10/20/2018 0904   BUN 8 10/20/2018 0904   BUN 23 04/21/2018 1046   CREATININE 0.62 10/20/2018 0904   CALCIUM 9.5 10/20/2018  0904   PROT 6.3 (L) 10/20/2018 0904   PROT 7.3 05/10/2018 1429   ALBUMIN 3.1 (L) 10/20/2018 0904   ALBUMIN 3.8 05/10/2018 1429   AST 24 10/20/2018 0904   ALT 12 10/20/2018 0904   ALKPHOS 123 10/20/2018 0904   BILITOT 0.6 10/20/2018 0904   BILITOT 6.2 (H) 05/10/2018 1429   GFRNONAA >60 10/20/2018 0904   GFRAA >60 10/20/2018 0904    No results found for: SPEP, UPEP  Lab Results  Component Value Date   WBC 15.9 (H) 10/20/2018   NEUTROABS 11.7 (H) 10/20/2018  HGB 8.1 (L) 10/20/2018   HCT 26.2 (L) 10/20/2018   MCV 90.0 10/20/2018   PLT 98 (L) 10/20/2018      Chemistry      Component Value Date/Time   NA 136 10/20/2018 0904   NA 137 04/21/2018 1046   K 3.5 10/20/2018 0904   CL 104 10/20/2018 0904   CO2 22 10/20/2018 0904   BUN 8 10/20/2018 0904   BUN 23 04/21/2018 1046   CREATININE 0.62 10/20/2018 0904      Component Value Date/Time   CALCIUM 9.5 10/20/2018 0904   ALKPHOS 123 10/20/2018 0904   AST 24 10/20/2018 0904   ALT 12 10/20/2018 0904   BILITOT 0.6 10/20/2018 0904   BILITOT 6.2 (H) 05/10/2018 1429       RADIOGRAPHIC STUDIES: I have personally reviewed the radiological images as listed and agreed with the findings in the report. No results found.   ASSESSMENT & PLAN:  Malignant neoplasm of head of pancreas ([place]) #Pancreatic adenocarcinoma/ pT2 pN1 M0, Stage IIB currently on adjuvant FOLFIRINOX.   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  #Proceed with FOLFIRINOX treatment #7  today. Labs today reviewed;  acceptable for treatment today.    #Hypokalemia potassium 2.7-currently improved at 3.5.  Continue home supplementation.  # Hypomagnesemia-magnesium 1.7 stable  # Grade 1 diarrhea,  stable  #Chemotherapy-induced thrombocytopenia,stable Monitor.   #Chemotherapy-induced anemia.  8.1 hemoglobin.  Monitor closely possible 1 unit transfusion next week.  #Peripheral neuropathy oxaliplatin  grade 1 -STABLE.   # DISPOSITION: # Chemo today; pump off/onpro Friday; IVF- 1 lit over 1 hour.  # 5/18- IVF over 1 hour;  # Appt with Dr.Yu  In 1 week; cbc/HOLD tube; cbc/bmp/mag-possible 1 unit PRBC transfusion;  # in 2 weeks- MD/Dr.Yu FOLFIRINOX; Day-3 pump off/onpro-Dr.B [keep appt as planned]    No orders of the defined types were placed in this encounter.  All questions were answered. The patient knows to call the clinic with any problems, questions or concerns.      Cammie Sickle, MD 10/20/2018 10:02 AM

## 2018-10-20 NOTE — Assessment & Plan Note (Addendum)
#  Pancreatic adenocarcinoma/ pT2 pN1 M0, Stage IIB currently on adjuvant FOLFIRINOX.   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  #Proceed with FOLFIRINOX treatment #7  today. Labs today reviewed;  acceptable for treatment today.    #Hypokalemia potassium 2.7-currently improved at 3.5.  Continue home supplementation.  # Hypomagnesemia-magnesium 1.7 stable  # Grade 1 diarrhea,  stable  #Chemotherapy-induced thrombocytopenia,stable Monitor.   #Chemotherapy-induced anemia.  8.1 hemoglobin.  Monitor closely possible 1 unit transfusion next week.  #Peripheral neuropathy oxaliplatin grade 1 -STABLE.   # DISPOSITION: # Chemo today; pump off/onpro Friday; IVF- 1 lit over 1 hour.  # 5/18- IVF over 1 hour;  # Appt with Dr.Yu  In 1 week; cbc/HOLD tube; cbc/bmp/mag-possible 1 unit PRBC transfusion;  # in 2 weeks- MD/Dr.Yu FOLFIRINOX; Day-3 pump off/onpro-Dr.B [keep appt as planned]

## 2018-10-21 MED ORDER — PROCHLORPERAZINE MALEATE 10 MG PO TABS: 10.0000 mg | ORAL_TABLET | Freq: Four times a day (QID) | ORAL | 1 refills | Status: DC | PRN

## 2018-10-21 MED ORDER — DEXAMETHASONE 4 MG PO TABS: 4.0000 mg | ORAL_TABLET | ORAL | 1 refills | Status: DC

## 2018-10-21 MED ORDER — LOPERAMIDE HCL 2 MG PO CAPS: 2.0000 mg | ORAL_CAPSULE | ORAL | 1 refills | Status: DC

## 2018-10-21 MED ORDER — POTASSIUM CHLORIDE CRYS ER 20 MEQ PO TBCR: 40.0000 meq | EXTENDED_RELEASE_TABLET | Freq: Every day | ORAL | 0 refills | Status: DC

## 2018-10-22 ENCOUNTER — Ambulatory Visit: Payer: Medicare Other

## 2018-10-22 ENCOUNTER — Telehealth: Payer: Self-pay | Admitting: Oncology

## 2018-10-22 ENCOUNTER — Inpatient Hospital Stay: Payer: Medicare Other | Attending: Internal Medicine

## 2018-10-22 ENCOUNTER — Other Ambulatory Visit: Payer: Self-pay

## 2018-10-22 VITALS — BP 138/80 | HR 89 | Resp 18

## 2018-10-22 DIAGNOSIS — Z7689 Persons encountering health services in other specified circumstances: Secondary | ICD-10-CM | POA: Diagnosis not present

## 2018-10-22 DIAGNOSIS — C250 Malignant neoplasm of head of pancreas: Secondary | ICD-10-CM | POA: Insufficient documentation

## 2018-10-22 MED ORDER — SODIUM CHLORIDE 0.9 % IV SOLN
Freq: Once | INTRAVENOUS | Status: AC
  Administered 2018-10-22: 11:00:00 via INTRAVENOUS
  Filled 2018-10-22: qty 250

## 2018-10-22 MED ORDER — PEGFILGRASTIM 6 MG/0.6ML ~~LOC~~ PSKT
6.0000 mg | PREFILLED_SYRINGE | Freq: Once | SUBCUTANEOUS | Status: AC
  Administered 2018-10-22: 6 mg via SUBCUTANEOUS
  Filled 2018-10-22: qty 0.6

## 2018-10-22 MED ORDER — SODIUM CHLORIDE 0.9% FLUSH
10.0000 mL | INTRAVENOUS | Status: DC | PRN
  Filled 2018-10-22: qty 10

## 2018-10-22 MED ORDER — HEPARIN SOD (PORK) LOCK FLUSH 100 UNIT/ML IV SOLN
500.0000 [IU] | Freq: Once | INTRAVENOUS | Status: AC
  Administered 2018-10-22: 500 [IU] via INTRAVENOUS
  Filled 2018-10-22: qty 5

## 2018-10-25 ENCOUNTER — Inpatient Hospital Stay: Payer: Medicare Other

## 2018-10-25 ENCOUNTER — Telehealth: Payer: Self-pay

## 2018-10-25 ENCOUNTER — Other Ambulatory Visit: Payer: Self-pay

## 2018-10-25 VITALS — BP 114/76 | HR 97 | Temp 97.9°F | Resp 20

## 2018-10-25 DIAGNOSIS — E871 Hypo-osmolality and hyponatremia: Secondary | ICD-10-CM

## 2018-10-25 DIAGNOSIS — Z5111 Encounter for antineoplastic chemotherapy: Secondary | ICD-10-CM | POA: Diagnosis not present

## 2018-10-25 MED ORDER — HEPARIN SOD (PORK) LOCK FLUSH 100 UNIT/ML IV SOLN
500.0000 [IU] | Freq: Once | INTRAVENOUS | Status: AC | PRN
  Administered 2018-10-25: 14:00:00 500 [IU]
  Filled 2018-10-25: qty 5

## 2018-10-25 MED ORDER — SODIUM CHLORIDE 0.9 % IV SOLN
Freq: Once | INTRAVENOUS | Status: AC
  Administered 2018-10-25: 13:00:00 via INTRAVENOUS
  Filled 2018-10-25: qty 250

## 2018-10-25 MED ORDER — SODIUM CHLORIDE 0.9% FLUSH
10.0000 mL | Freq: Once | INTRAVENOUS | Status: AC | PRN
  Administered 2018-10-25: 10 mL
  Filled 2018-10-25: qty 10

## 2018-10-25 NOTE — Telephone Encounter (Signed)
Nutrition Follow-up  RD working remotely.  Patient with pancreatic cancer, s/p whipple.  Patient receiving chemotherapy.  Patient returned RD's call.  Patient reports appetite maybe a little bit better.  Reports that she received fluids today.  Reports diarrhea is better but on days when receives fluid has more diarrhea than on other days.  Reports had diarrhea this am and will likely have it again tonight. Reports imodium helps.  Reports has been eating cereal, oatmeal, potatoes, succotash.  Reports no taste for meats.    Patient has not tried "clear" juice type shakes yet.  Medications: reviewed  Labs: K 2.4 (supplemented)  Anthropometrics:   Weight 135 lb (5/13) decreased from 143 lb on 4/22   NUTRITION DIAGNOSIS: Inadequate oral intake continues   INTERVENTION:  Reviewed additional good sources of protein as patient with no taste for meat.   Encouraged trying juice type shakes to see if she can tolerate better than "milky" shakes     MONITORING, EVALUATION, GOAL: Patient will consume adequate calories and protein to maintain weight   NEXT VISIT: phone f/u Thursday, June 18  Eisa Necaise B. Zenia Resides, [place], [place] Registered Dietitian [phone] (pager)

## 2018-10-25 NOTE — Progress Notes (Signed)
Nutrition  RD working remotely.  Called patient for nutrition follow-up.  No answer.  Left message with call back number.  Izaak Sahr B. Zenia Resides, [place], [place] Registered Dietitian [phone] (pager)

## 2018-10-26 ENCOUNTER — Inpatient Hospital Stay (HOSPITAL_BASED_OUTPATIENT_CLINIC_OR_DEPARTMENT_OTHER): Payer: Medicare Other | Admitting: Oncology

## 2018-10-26 ENCOUNTER — Inpatient Hospital Stay (HOSPITAL_BASED_OUTPATIENT_CLINIC_OR_DEPARTMENT_OTHER): Payer: Medicare Other | Admitting: Hospice and Palliative Medicine

## 2018-10-26 ENCOUNTER — Encounter: Payer: Self-pay | Admitting: Oncology

## 2018-10-26 ENCOUNTER — Other Ambulatory Visit: Payer: Self-pay

## 2018-10-26 DIAGNOSIS — D6481 Anemia due to antineoplastic chemotherapy: Secondary | ICD-10-CM | POA: Diagnosis not present

## 2018-10-26 DIAGNOSIS — C250 Malignant neoplasm of head of pancreas: Secondary | ICD-10-CM

## 2018-10-26 DIAGNOSIS — E860 Dehydration: Secondary | ICD-10-CM

## 2018-10-26 DIAGNOSIS — T451X5A Adverse effect of antineoplastic and immunosuppressive drugs, initial encounter: Secondary | ICD-10-CM

## 2018-10-26 DIAGNOSIS — R197 Diarrhea, unspecified: Secondary | ICD-10-CM

## 2018-10-26 DIAGNOSIS — E46 Unspecified protein-calorie malnutrition: Secondary | ICD-10-CM | POA: Diagnosis not present

## 2018-10-26 DIAGNOSIS — D696 Thrombocytopenia, unspecified: Secondary | ICD-10-CM

## 2018-10-26 DIAGNOSIS — Z515 Encounter for palliative care: Secondary | ICD-10-CM

## 2018-10-26 NOTE — Progress Notes (Signed)
HEMATOLOGY-ONCOLOGY TeleHEALTH VISIT PROGRESS NOTE  I connected with Lynn Taylor on 10/26/18 at  9:15 AM EDT by video enabled telemedicine visit and verified that I am speaking with the correct person using two identifiers. I discussed the limitations, risks, security and privacy concerns of performing an evaluation and management service by telemedicine and the availability of in-person appointments. I also discussed with the patient that there may be a patient responsible charge related to this service. The patient expressed understanding and agreed to proceed.   Other persons participating in the visit and their role in the encounter:  Geraldine Solar, CMA, check in patient     Patient's location: Home  Provider's location: work Chief Complaint: follow up for assessment of chemotherapy tolerability and pancreatic cancer. .    INTERVAL HISTORY Lynn Taylor is a 73 y.o. female who has above history reviewed by me today presents for follow up visit for management of pancreatic cancer,  Problems and complaints are listed below:  Patient received Dose reduced cycle 7 FOLFIRI [place] 1 week ago. She reports feeling tired and weak for a few days after chemotherapy. Loose stool episodes x2 yesterday. She has been having IV fluid hydration every other day since chemotherapy. Appetite is poor.  Not eating very much. Reports having Watery eyes.  No exacerbating or alleviating factors.  Review of Systems  Constitutional: Positive for appetite change and fatigue. Negative for chills and fever.  HENT:   Negative for hearing loss and voice change.        Watery eyes  Eyes: Negative for eye problems.  Respiratory: Negative for chest tightness and cough.   Cardiovascular: Negative for chest pain.  Gastrointestinal: Positive for diarrhea. Negative for abdominal distention, abdominal pain and blood in stool.  Endocrine: Negative for hot flashes.  Genitourinary: Negative for difficulty urinating and  frequency.   Musculoskeletal: Negative for arthralgias.  Skin: Negative for itching and rash.  Neurological: Negative for extremity weakness.  Hematological: Negative for adenopathy.  Psychiatric/Behavioral: Negative for confusion.    Past Medical History:  Diagnosis Date  . Cancer ([place])   . GERD (gastroesophageal reflux disease)   . Hypertension   . Hypothyroidism   . Malignant neoplasm of head of pancreas ([place]) 06/08/2018   Past Surgical History:  Procedure Laterality Date  . CHOLECYSTECTOMY    . COLONOSCOPY N/A 12/02/2016   Procedure: COLONOSCOPY;  Surgeon: Lollie Sails, MD;  Location: [facility] ENDOSCOPY;  Service: Endoscopy;  Laterality: N/A;  . ERCP N/A 04/30/2018   Procedure: ENDOSCOPIC RETROGRADE CHOLANGIOPANCREATOGRAPHY (ERCP);  Surgeon: Lucilla Lame, MD;  Location: [facility] ENDOSCOPY;  Service: Endoscopy;  Laterality: N/A;  . ERCP N/A 05/05/2018   Procedure: ENDOSCOPIC RETROGRADE CHOLANGIOPANCREATOGRAPHY (ERCP);  Surgeon: Lucilla Lame, MD;  Location: [facility] ENDOSCOPY;  Service: Endoscopy;  Laterality: N/A;  . PORTA CATH INSERTION N/A 06/28/2018   Procedure: PORTA CATH INSERTION;  Surgeon: Algernon Huxley, MD;  Location: [place] CV LAB;  Service: Cardiovascular;  Laterality: N/A;    Family History  Problem Relation Age of Onset  . Diabetes Mellitus I Other   . Alcoholism Other   . Hypertension Other   . Hyperlipidemia Other   . Coronary artery disease Other   . Stroke Other   . Osteoarthritis Other   . Migraines Other   . Heart Problems Mother   . Stroke Sister   . CAD Brother   . Stroke Brother   . Breast cancer Neg Hx     Social History   Socioeconomic  History  . Marital status: Married    Spouse name: Not on file  . Number of children: Not on file  . Years of education: Not on file  . Highest education level: Not on file  Occupational History  . Not on file  Social Needs  . Financial resource strain: Not on file  . Food insecurity:    Worry: Not on file     Inability: Not on file  . Transportation needs:    Medical: Not on file    Non-medical: Not on file  Tobacco Use  . Smoking status: Former Smoker    Last attempt to quit: 04/14/1988    Years since quitting: 30.5  . Smokeless tobacco: Never Used  Substance and Sexual Activity  . Alcohol use: Yes    Comment: social drinker  . Drug use: No  . Sexual activity: Not Currently  Lifestyle  . Physical activity:    Days per week: Not on file    Minutes per session: Not on file  . Stress: Not on file  Relationships  . Social connections:    Talks on phone: Not on file    Gets together: Not on file    Attends religious service: Not on file    Active member of club or organization: Not on file    Attends meetings of clubs or organizations: Not on file    Relationship status: Not on file  . Intimate partner violence:    Fear of current or ex partner: Not on file    Emotionally abused: Not on file    Physically abused: Not on file    Forced sexual activity: Not on file  Other Topics Concern  . Not on file  Social History Narrative  . Not on file    Current Outpatient Medications on File Prior to Visit  Medication Sig Dispense Refill  . Cholecalciferol (VITAMIN D-3) 25 MCG (1000 UT) CAPS Take by mouth.    . dexamethasone (DECADRON) 4 MG tablet Take 1 tablet (4 mg total) by mouth 1 day or 1 dose. 30 tablet 1  . levothyroxine (SYNTHROID, LEVOTHROID) 100 MCG tablet Take 1 tablet (100 mcg total) by mouth daily before breakfast. 90 tablet 4  . lidocaine-prilocaine (EMLA) cream Apply 1 application topically as needed. 30 g 3  . loperamide (IMODIUM) 2 MG capsule Take 1 capsule (2 mg total) by mouth See admin instructions. With onset of loose stool, take 4mg  followed by 2mg  every 2 hours until 12 hours have passed without loose bowel movement. Maximum: 16 mg/day 120 capsule 1  . loratadine (CLARITIN) 10 MG tablet Take 10 mg by mouth daily.    . Magnesium Cl-Calcium Carbonate (SLOW-MAG PO)  Take 2 tablets by mouth daily.    . megestrol (MEGACE) 40 MG tablet Take 1 tablet (40 mg total) by mouth 4 (four) times daily. 120 tablet 0  . nystatin (MYCOSTATIN) 100000 UNIT/ML suspension Take 5 mLs (500,000 Units total) by mouth 4 (four) times daily. 473 mL 1  . omeprazole (PRILOSEC) 20 MG capsule Take 1 capsule by mouth once daily 90 capsule 0  . ondansetron (ZOFRAN) 4 MG tablet Take 1 tablet (4 mg total) by mouth every 6 (six) hours as needed for nausea or vomiting. 30 tablet 3  . potassium chloride SA (K-DUR) 20 MEQ tablet Take 2 tablets (40 mEq total) by mouth daily. 28 tablet 0  . prochlorperazine (COMPAZINE) 10 MG tablet Take 1 tablet (10 mg total) by mouth every 6 (six)  hours as needed (NAUSEA). 30 tablet 1  . chlorhexidine (PERIDEX) 0.12 % solution Use as directed 15 mLs in the mouth or throat 2 (two) times daily. (Patient not taking: Reported on 10/26/2018) 473 mL 3   No current facility-administered medications on file prior to visit.     No Known Allergies     Observations/Objective: Today's Vitals   10/26/18 0927  PainSc: 0-No pain   There is no height or weight on file to calculate BMI.  Physical Exam  Constitutional: She is oriented to person, place, and time. No distress.  Cachetic   HENT:  Head: Atraumatic.  Pulmonary/Chest: Effort normal.  Neurological: She is alert and oriented to person, place, and time.  Psychiatric: Affect normal.    CBC    Component Value Date/Time   WBC 15.9 (H) 10/20/2018 0904   RBC 2.91 (L) 10/20/2018 0904   HGB 8.1 (L) 10/20/2018 0904   HGB 13.0 11/12/2017 0837   HCT 26.2 (L) 10/20/2018 0904   HCT 38.5 11/12/2017 0837   PLT 98 (L) 10/20/2018 0904   PLT 228 11/12/2017 0837   MCV 90.0 10/20/2018 0904   MCV 84 11/12/2017 0837   MCH 27.8 10/20/2018 0904   MCHC 30.9 10/20/2018 0904   RDW 24.9 (H) 10/20/2018 0904   RDW 14.9 11/12/2017 0837   LYMPHSABS 2.6 10/20/2018 0904   MONOABS 1.1 (H) 10/20/2018 0904   EOSABS 0.0 10/20/2018  0904   BASOSABS 0.1 10/20/2018 0904    CMP     Component Value Date/Time   NA 136 10/20/2018 0904   NA 137 04/21/2018 1046   K 3.5 10/20/2018 0904   CL 104 10/20/2018 0904   CO2 22 10/20/2018 0904   GLUCOSE 113 (H) 10/20/2018 0904   BUN 8 10/20/2018 0904   BUN 23 04/21/2018 1046   CREATININE 0.62 10/20/2018 0904   CALCIUM 9.5 10/20/2018 0904   PROT 6.3 (L) 10/20/2018 0904   PROT 7.3 05/10/2018 1429   ALBUMIN 3.1 (L) 10/20/2018 0904   ALBUMIN 3.8 05/10/2018 1429   AST 24 10/20/2018 0904   ALT 12 10/20/2018 0904   ALKPHOS 123 10/20/2018 0904   BILITOT 0.6 10/20/2018 0904   BILITOT 6.2 (H) 05/10/2018 1429   GFRNONAA >60 10/20/2018 0904   GFRAA >60 10/20/2018 0904     Assessment and Plan: 1. Malignant neoplasm of head of pancreas ([place])   2. Anemia due to antineoplastic chemotherapy   3. Protein-calorie malnutrition, unspecified severity ([place])   4. Dehydration   5. Diarrhea, unspecified type   6. Thrombocytopenia ([place])     Patient tolerates dose reduced modified FOLFIRINOX with mild to moderate difficulties. Intermittent diarrhea, chemotherapy-induced, continue Imodium as instructed. Patient will continue to get IV fluid hydration every other day for supportive care. Protein calorie malnutrition, continue follow-up with dietitian.  Continue supplements.  #10/27/2018 labs reviewed. Symptomatic anemia, proceed with 1 unit of PRBC transfusion. Thrombocytopenia secondary to chemotherapy.  Platelet count 15,000.  Close monitor.  She will repeat labs on 10/28/2018  Patient's daughter called and reports patient has an open wound buttock. patient will be seen by palliative care Altha Harm today.  Communicated with palliative care Vonna Kotyk [employer] for up to me that patient has a prolonged Stutter today.  We will obtain stat CT head to rule out intracranial process in the context of thrombocytopenia.  Follow Up Instructions: 1 week for lab and MD assessment prior to cycle 7 dose  reduced modified FOLFIRINOX.  Orders Placed This Encounter  Procedures  .  CT Head Wo Contrast    Standing Status:   Future    Standing Expiration Date:   10/28/2019    Order Specific Question:   ** REASON FOR EXAM (FREE TEXT)    Answer:   thrombocytopenia, change of mental status    Order Specific Question:   Preferred imaging location?    Answer:   [facility]    Order Specific Question:   Radiology Contrast Protocol - do NOT remove file path    Answer:   \\[email or web address]    I discussed the assessment and treatment plan with the patient. The patient was provided an opportunity to ask questions and all were answered. The patient agreed with the plan and demonstrated an understanding of the instructions.  The patient was advised to call back or seek an in-person evaluation if the symptoms worsen or if the condition fails to improve as anticipated.   Earlie Server, MD 10/26/2018 9:35 PM

## 2018-10-26 NOTE — Progress Notes (Signed)
Called patient for Telehealth visit via [place].  Patient c/o watery eyes.

## 2018-10-27 ENCOUNTER — Other Ambulatory Visit: Payer: Self-pay

## 2018-10-27 ENCOUNTER — Inpatient Hospital Stay: Payer: Medicare Other

## 2018-10-27 ENCOUNTER — Other Ambulatory Visit: Payer: Self-pay | Admitting: Oncology

## 2018-10-27 ENCOUNTER — Other Ambulatory Visit: Payer: Self-pay | Admitting: Hospice and Palliative Medicine

## 2018-10-27 ENCOUNTER — Inpatient Hospital Stay (HOSPITAL_BASED_OUTPATIENT_CLINIC_OR_DEPARTMENT_OTHER): Payer: Medicare Other | Admitting: Hospice and Palliative Medicine

## 2018-10-27 VITALS — BP 126/80 | HR 80 | Temp 98.1°F | Resp 20

## 2018-10-27 DIAGNOSIS — Z87891 Personal history of nicotine dependence: Secondary | ICD-10-CM

## 2018-10-27 DIAGNOSIS — C250 Malignant neoplasm of head of pancreas: Secondary | ICD-10-CM

## 2018-10-27 DIAGNOSIS — Z79899 Other long term (current) drug therapy: Secondary | ICD-10-CM

## 2018-10-27 DIAGNOSIS — L89159 Pressure ulcer of sacral region, unspecified stage: Secondary | ICD-10-CM

## 2018-10-27 DIAGNOSIS — E876 Hypokalemia: Secondary | ICD-10-CM

## 2018-10-27 DIAGNOSIS — E871 Hypo-osmolality and hyponatremia: Secondary | ICD-10-CM

## 2018-10-27 DIAGNOSIS — I10 Essential (primary) hypertension: Secondary | ICD-10-CM

## 2018-10-27 DIAGNOSIS — Z515 Encounter for palliative care: Secondary | ICD-10-CM | POA: Diagnosis not present

## 2018-10-27 DIAGNOSIS — E039 Hypothyroidism, unspecified: Secondary | ICD-10-CM

## 2018-10-27 DIAGNOSIS — R531 Weakness: Secondary | ICD-10-CM

## 2018-10-27 DIAGNOSIS — R630 Anorexia: Secondary | ICD-10-CM

## 2018-10-27 DIAGNOSIS — D6481 Anemia due to antineoplastic chemotherapy: Secondary | ICD-10-CM

## 2018-10-27 DIAGNOSIS — D696 Thrombocytopenia, unspecified: Secondary | ICD-10-CM

## 2018-10-27 DIAGNOSIS — Z5111 Encounter for antineoplastic chemotherapy: Secondary | ICD-10-CM | POA: Diagnosis not present

## 2018-10-27 DIAGNOSIS — K219 Gastro-esophageal reflux disease without esophagitis: Secondary | ICD-10-CM

## 2018-10-27 LAB — CBC WITH DIFFERENTIAL/PLATELET
Abs Immature Granulocytes: 0.06 10*3/uL (ref 0.00–0.07)
Basophils Absolute: 0 10*3/uL (ref 0.0–0.1)
Basophils Relative: 0 %
Eosinophils Absolute: 0 10*3/uL (ref 0.0–0.5)
Eosinophils Relative: 0 %
HCT: 20.2 % — ABNORMAL LOW (ref 36.0–46.0)
Hemoglobin: 6.4 g/dL — ABNORMAL LOW (ref 12.0–15.0)
Immature Granulocytes: 3 %
Lymphocytes Relative: 35 %
Lymphs Abs: 0.8 10*3/uL (ref 0.7–4.0)
MCH: 28.7 pg (ref 26.0–34.0)
MCHC: 31.7 g/dL (ref 30.0–36.0)
MCV: 90.6 fL (ref 80.0–100.0)
Monocytes Absolute: 0.1 10*3/uL (ref 0.1–1.0)
Monocytes Relative: 4 %
Neutro Abs: 1.4 10*3/uL — ABNORMAL LOW (ref 1.7–7.7)
Neutrophils Relative %: 58 %
Platelets: 15 10*3/uL — CL (ref 150–400)
RBC: 2.23 MIL/uL — ABNORMAL LOW (ref 3.87–5.11)
RDW: 22.5 % — ABNORMAL HIGH (ref 11.5–15.5)
Smear Review: NORMAL
WBC: 2.4 10*3/uL — ABNORMAL LOW (ref 4.0–10.5)
nRBC: 0 % (ref 0.0–0.2)

## 2018-10-27 LAB — COMPREHENSIVE METABOLIC PANEL
ALT: 10 U/L (ref 0–44)
AST: 23 U/L (ref 15–41)
Albumin: 3.2 g/dL — ABNORMAL LOW (ref 3.5–5.0)
Alkaline Phosphatase: 105 U/L (ref 38–126)
Anion gap: 9 (ref 5–15)
BUN: 6 mg/dL — ABNORMAL LOW (ref 8–23)
CO2: 23 mmol/L (ref 22–32)
Calcium: 8.7 mg/dL — ABNORMAL LOW (ref 8.9–10.3)
Chloride: 100 mmol/L (ref 98–111)
Creatinine, Ser: 0.47 mg/dL (ref 0.44–1.00)
GFR calc Af Amer: >60 mL/min (ref >60)
GFR calc non Af Amer: >60 mL/min (ref >60)
Glucose, Bld: 111 mg/dL — ABNORMAL HIGH (ref 70–99)
Potassium: 2.9 mmol/L — ABNORMAL LOW (ref 3.5–5.1)
Sodium: 132 mmol/L — ABNORMAL LOW (ref 135–145)
Total Bilirubin: 1.2 mg/dL (ref 0.3–1.2)
Total Protein: 6.1 g/dL — ABNORMAL LOW (ref 6.5–8.1)

## 2018-10-27 LAB — SAMPLE TO BLOOD BANK

## 2018-10-27 LAB — MAGNESIUM: Magnesium: 1.8 mg/dL (ref 1.7–2.4)

## 2018-10-27 LAB — PREPARE RBC (CROSSMATCH)

## 2018-10-27 LAB — ABO/RH: ABO/RH(D): A POS

## 2018-10-27 MED ORDER — ACETAMINOPHEN 325 MG PO TABS
650.0000 mg | ORAL_TABLET | Freq: Once | ORAL | Status: AC
  Administered 2018-10-27: 650 mg via ORAL
  Filled 2018-10-27: qty 2

## 2018-10-27 MED ORDER — SODIUM CHLORIDE 0.9 % IV SOLN
40.0000 meq | Freq: Once | INTRAVENOUS | Status: AC
  Administered 2018-10-27: 40 meq via INTRAVENOUS
  Filled 2018-10-27: qty 20

## 2018-10-27 MED ORDER — SODIUM CHLORIDE 0.9 % IV SOLN
Freq: Once | INTRAVENOUS | Status: DC
  Filled 2018-10-27: qty 500

## 2018-10-27 MED ORDER — DIPHENHYDRAMINE HCL 25 MG PO CAPS
25.0000 mg | ORAL_CAPSULE | Freq: Once | ORAL | Status: AC
  Administered 2018-10-27: 25 mg via ORAL
  Filled 2018-10-27: qty 1

## 2018-10-27 MED ORDER — SODIUM CHLORIDE 0.9 % IV SOLN
40.0000 meq | Freq: Once | INTRAVENOUS | Status: DC
  Filled 2018-10-27: qty 20

## 2018-10-27 MED ORDER — SODIUM CHLORIDE 0.9% IV SOLUTION
250.0000 mL | Freq: Once | INTRAVENOUS | Status: AC
  Administered 2018-10-27: 250 mL via INTRAVENOUS
  Filled 2018-10-27: qty 250

## 2018-10-27 MED ORDER — SODIUM CHLORIDE 0.9% FLUSH
10.0000 mL | Freq: Once | INTRAVENOUS | Status: DC
  Filled 2018-10-27: qty 10

## 2018-10-27 MED ORDER — HEPARIN SOD (PORK) LOCK FLUSH 100 UNIT/ML IV SOLN
500.0000 [IU] | Freq: Once | INTRAVENOUS | Status: AC
  Administered 2018-10-27: 500 [IU] via INTRAVENOUS
  Filled 2018-10-27: qty 5

## 2018-10-27 MED ORDER — SODIUM CHLORIDE 0.9 % IV SOLN
Freq: Once | INTRAVENOUS | Status: DC
  Filled 2018-10-27: qty 250

## 2018-10-27 NOTE — Progress Notes (Signed)
[place]  [phone] [phone]   Name: Lynn Taylor Date: 10/27/2018 MRN: [id number]  DOB: 11-23-1945  Patient Care Team: Jodelle Green, FNP as PCP - General (Family Medicine) Clent Jacks, RN as Registered Nurse    REASON FOR CONSULTATION: Palliative Care consult requested for this 73 y.o. female with multiple medical problems including stage IIB pancreatic cancer status post Whipple on adjuvant FOLFIRINOX chemotherapy.  Patient has had weakness and poor oral intake.  She was referred to palliative care for support and to address goals.  SOCIAL HISTORY:     reports that she quit smoking about 30 years ago. She has never used smokeless tobacco. She reports current alcohol use. She reports that she does not use drugs.   Patient is married and lives at home with her husband, daughter, and granddaughter.  Patient also has 2 sons, 1 of whom lives nearby and the other in [place].  Patient retired from [employer] and then worked for several years in [employer].  ADVANCE DIRECTIVES:    CODE STATUS:   PAST MEDICAL HISTORY: Past Medical History:  Diagnosis Date  . Cancer ([place])   . GERD (gastroesophageal reflux disease)   . Hypertension   . Hypothyroidism   . Malignant neoplasm of head of pancreas ([place]) 06/08/2018    PAST SURGICAL HISTORY:  Past Surgical History:  Procedure Laterality Date  . CHOLECYSTECTOMY    . COLONOSCOPY N/A 12/02/2016   Procedure: COLONOSCOPY;  Surgeon: Lollie Sails, MD;  Location: [facility] ENDOSCOPY;  Service: Endoscopy;  Laterality: N/A;  . ERCP N/A 04/30/2018   Procedure: ENDOSCOPIC RETROGRADE CHOLANGIOPANCREATOGRAPHY (ERCP);  Surgeon: Lucilla Lame, MD;  Location: [facility] ENDOSCOPY;  Service: Endoscopy;  Laterality: N/A;  . ERCP N/A 05/05/2018   Procedure: ENDOSCOPIC RETROGRADE CHOLANGIOPANCREATOGRAPHY (ERCP);  Surgeon: Lucilla Lame, MD;  Location: [facility] ENDOSCOPY;   Service: Endoscopy;  Laterality: N/A;  . PORTA CATH INSERTION N/A 06/28/2018   Procedure: PORTA CATH INSERTION;  Surgeon: Algernon Huxley, MD;  Location: [facility] CV LAB;  Service: Cardiovascular;  Laterality: N/A;    HEMATOLOGY/ONCOLOGY HISTORY:    Malignant neoplasm of head of pancreas ([place])   06/08/2018 Initial Diagnosis    Malignant neoplasm of head of pancreas ([place])    07/13/2018 -  Chemotherapy    The patient had palonosetron (ALOXI) injection 0.25 mg, 0.25 mg, Intravenous,  Once, 7 of 12 cycles Administration: 0.25 mg (07/13/2018), 0.25 mg (09/22/2018), 0.25 mg (10/06/2018), 0.25 mg (10/20/2018), 0.25 mg (08/03/2018), 0.25 mg (08/18/2018), 0.25 mg (09/01/2018) pegfilgrastim (NEULASTA ONPRO KIT) injection 6 mg, 6 mg, Subcutaneous, Once, 7 of 12 cycles Administration: 6 mg (07/15/2018), 6 mg (08/05/2018), 6 mg (09/24/2018), 6 mg (10/08/2018), 6 mg (08/20/2018), 6 mg (09/03/2018) irinotecan (CAMPTOSAR) 340 mg in dextrose 5 % 500 mL chemo infusion, 180 mg/m2 = 340 mg, Intravenous,  Once, 7 of 12 cycles Dose modification: 65 mg/m2 (original dose 180 mg/m2, Cycle 5, Reason: Dose not tolerated), 65 mg/m2 (original dose 180 mg/m2, Cycle 2, Reason: Dose not tolerated), 65 mg/m2 (original dose 180 mg/m2, Cycle 3, Reason: Dose not tolerated), 65 mg/m2 (original dose 180 mg/m2, Cycle 4, Reason: Dose not tolerated) Administration: 340 mg (07/13/2018), 120 mg (10/06/2018), 100 mg (10/20/2018), 120 mg (08/03/2018), 120 mg (08/18/2018), 120 mg (09/01/2018) leucovorin 750 mg in dextrose 5 % 250 mL infusion, 752 mg, Intravenous,  Once, 7 of 12 cycles Administration: 750 mg (07/13/2018), 650 mg (10/06/2018), 650 mg (10/20/2018), 750 mg (08/03/2018), 750  mg (08/18/2018), 750 mg (09/01/2018) oxaliplatin (ELOXATIN) 150 mg in dextrose 5 % 500 mL chemo infusion, 160 mg, Intravenous,  Once, 7 of 12 cycles Dose modification: 80 mg/m2 (original dose 85 mg/m2, Cycle 5, Reason: Provider Judgment), 75 mg/m2 (original dose 85 mg/m2, Cycle 2,  Reason: Dose not tolerated), 80 mg/m2 (original dose 85 mg/m2, Cycle 4, Reason: Dose not tolerated) Administration: 150 mg (07/13/2018), 135 mg (09/22/2018), 135 mg (10/06/2018), 135 mg (10/20/2018), 135 mg (08/03/2018), 150 mg (08/18/2018), 150 mg (09/01/2018) fluorouracil (ADRUCIL) chemo injection 750 mg, 400 mg/m2 = 750 mg, Intravenous,  Once, 4 of 4 cycles Dose modification: 200 mg/m2 (original dose 400 mg/m2, Cycle 2, Reason: Dose not tolerated) Administration: 750 mg (07/13/2018), 350 mg (08/03/2018), 750 mg (08/18/2018), 700 mg (09/01/2018) fluorouracil (ADRUCIL) 4,500 mg in sodium chloride 0.9 % 60 mL chemo infusion, 2,400 mg/m2 = 4,500 mg, Intravenous, 1 Day/Dose, 7 of 12 cycles Dose modification: 1,200 mg/m2 (original dose 2,400 mg/m2, Cycle 2, Reason: Dose not tolerated) Administration: 4,500 mg (07/13/2018), 4,150 mg (09/22/2018), 4,150 mg (10/06/2018), 4,150 mg (10/20/2018), 2,250 mg (08/03/2018), 4,500 mg (08/18/2018), 4,150 mg (09/01/2018)  for chemotherapy treatment.      ALLERGIES:  has No Known Allergies.  MEDICATIONS:  Current Outpatient Medications  Medication Sig Dispense Refill  . chlorhexidine (PERIDEX) 0.12 % solution Use as directed 15 mLs in the mouth or throat 2 (two) times daily. (Patient not taking: Reported on 10/26/2018) 473 mL 3  . Cholecalciferol (VITAMIN D-3) 25 MCG (1000 UT) CAPS Take by mouth.    . dexamethasone (DECADRON) 4 MG tablet Take 1 tablet (4 mg total) by mouth 1 day or 1 dose. 30 tablet 1  . levothyroxine (SYNTHROID, LEVOTHROID) 100 MCG tablet Take 1 tablet (100 mcg total) by mouth daily before breakfast. 90 tablet 4  . lidocaine-prilocaine (EMLA) cream Apply 1 application topically as needed. 30 g 3  . loperamide (IMODIUM) 2 MG capsule Take 1 capsule (2 mg total) by mouth See admin instructions. With onset of loose stool, take 77m followed by 247mevery 2 hours until 12 hours have passed without loose bowel movement. Maximum: 16 mg/day 120 capsule 1  . loratadine  (CLARITIN) 10 MG tablet Take 10 mg by mouth daily.    . Magnesium Cl-Calcium Carbonate (SLOW-MAG PO) Take 2 tablets by mouth daily.    . megestrol (MEGACE) 40 MG tablet Take 1 tablet (40 mg total) by mouth 4 (four) times daily. 120 tablet 0  . nystatin (MYCOSTATIN) 100000 UNIT/ML suspension Take 5 mLs (500,000 Units total) by mouth 4 (four) times daily. 473 mL 1  . omeprazole (PRILOSEC) 20 MG capsule Take 1 capsule by mouth once daily 90 capsule 0  . ondansetron (ZOFRAN) 4 MG tablet Take 1 tablet (4 mg total) by mouth every 6 (six) hours as needed for nausea or vomiting. 30 tablet 3  . potassium chloride SA (K-DUR) 20 MEQ tablet Take 2 tablets (40 mEq total) by mouth daily. 28 tablet 0  . prochlorperazine (COMPAZINE) 10 MG tablet Take 1 tablet (10 mg total) by mouth every 6 (six) hours as needed (NAUSEA). 30 tablet 1   No current facility-administered medications for this visit.    Facility-Administered Medications Ordered in Other Visits  Medication Dose Route Frequency Provider Last Rate Last Dose  . 0.9 %  sodium chloride infusion   Intravenous Once YuEarlie ServerMD      . heparin lock flush 100 unit/mL  500 Units Intravenous Once YuEarlie ServerMD      .  sodium chloride flush (NS) 0.9 % injection 10 mL  10 mL Intravenous Once Earlie Server, MD        VITAL SIGNS: There were no vitals taken for this visit. There were no vitals filed for this visit.  Estimated body mass index is 24.69 kg/m as calculated from the following:   Height as of 10/20/18: _0  (1.575 m).   Weight as of 10/20/18: 135 lb (61.2 kg).  LABS: CBC:    Component Value Date/Time   WBC 2.4 (L) 10/27/2018 0921   HGB 6.4 (L) 10/27/2018 0921   HGB 13.0 11/12/2017 0837   HCT 20.2 (L) 10/27/2018 0921   HCT 38.5 11/12/2017 0837   PLT 15 (LL) 10/27/2018 0921   PLT 228 11/12/2017 0837   MCV 90.6 10/27/2018 0921   MCV 84 11/12/2017 0837   NEUTROABS 1.4 (L) 10/27/2018 0921   LYMPHSABS 0.8 10/27/2018 0921   MONOABS 0.1 10/27/2018  0921   EOSABS 0.0 10/27/2018 0921   BASOSABS 0.0 10/27/2018 0921   Comprehensive Metabolic Panel:    Component Value Date/Time   NA 132 (L) 10/27/2018 0921   NA 137 04/21/2018 1046   K 2.9 (L) 10/27/2018 0921   CL 100 10/27/2018 0921   CO2 23 10/27/2018 0921   BUN 6 (L) 10/27/2018 0921   BUN 23 04/21/2018 1046   CREATININE 0.47 10/27/2018 0921   GLUCOSE 111 (H) 10/27/2018 0921   CALCIUM 8.7 (L) 10/27/2018 0921   AST 23 10/27/2018 0921   ALT 10 10/27/2018 0921   ALKPHOS 105 10/27/2018 0921   BILITOT 1.2 10/27/2018 0921   BILITOT 6.2 (H) 05/10/2018 1429   PROT 6.1 (L) 10/27/2018 0921   PROT 7.3 05/10/2018 1429   ALBUMIN 3.2 (L) 10/27/2018 0921   ALBUMIN 3.8 05/10/2018 1429    RADIOGRAPHIC STUDIES: No results found.  PERFORMANCE STATUS (ECOG) : 2 - Symptomatic, <50% confined to bed  Review of Systems Unless otherwise noted, a complete review of systems is negative.  Physical Exam General: NAD, frail appearing, thin Pulmonary: Unlabored Extremities: no edema Skin: 4x1cm superficial wound to L. Buttocks. Without any erythema or drainage Neurological: Weakness but otherwise nonfocal      Patient was examined with assistance of Renita Papa, RN.  IMPRESSION: Patient was an add-on to the clinic schedule today to evaluate development of a pressure ulcer on her sacrum.  Patient appears to have a superficial stage II pressure ulcer to left buttocks (see image above).  Hydrocolloid dressing applied with instructions given for wound care including washing wound with soap and water, pat dry, and use of hydrocolloid dressing.  Will monitor in the clinic and consider referral to wound center if needed.  Discussed methods of offloading pressure.  Recommended soft cushions when sitting.  Maximize movement and ambulation.  Will order home health PT for ambulation.  Weight has down trended to 135 pounds on 5/13 from 143 pounds on 4/22.  BMI is 24.6.  Serum albumin is 3.1.   Discussed importance of nutritional support with a focus in increasing protein.  Patient is being followed by RD with recommendation for oral supplements.  Case and plan discussed with Dr. Tasia Catchings.  PLAN: -Continue current scope of treatment -Wound care with hydrocolloid dressing -Pressure offloading device when sitting -Encourage ambulation -Referral home health PT -Patient is being followed by RD  -Recommend oral supplements BID-TID -RTC in 1 week   Patient expressed understanding and was in agreement with this plan. She also understands that She can call  the clinic at any time with any questions, concerns, or complaints.     Time Total: 20 minutes  Visit consisted of counseling and education dealing with the complex and emotionally intense issues of symptom management and palliative care in the setting of serious and potentially life-threatening illness.Greater than 50%  of this time was spent counseling and coordinating care related to the above assessment and plan.  Signed by: Altha Harm, PhD, NP-C [phone] (Work Cell)

## 2018-10-27 NOTE — Progress Notes (Signed)
Virtual Visit via Telephone Note  I connected with Lynn Taylor on 10/27/18 at  9:30 AM EDT by telephone and verified that I am speaking with the correct person using two identifiers.   I discussed the limitations, risks, security and privacy concerns of performing an evaluation and management service by telephone and the availability of in person appointments. I also discussed with the patient that there may be a patient responsible charge related to this service. The patient expressed understanding and agreed to proceed.   History of Present Illness: Palliative Care consult requested for this 73 y.o. female with multiple medical problems including stage IIB pancreatic cancer status post Whipple on adjuvant FOLFIRINOX chemotherapy.  Patient has had weakness and poor oral intake.  She was referred to palliative care for support and to address goals.   Observations/Objective: I called patient to follow-up on her symptoms.  She reports doing reasonably well without any acute changes or concerns today.  Patient is also had a telephone visit this morning with Dr. Tasia Catchings.  Patient denies pain.  Her oral intake remains poor but patient feels like it is slightly better than it was.  She is being actively followed by our dietitian.  We discussed advance care planning.  She does not have an [facility] POA or living will but says she would want her husband to be her decision-maker if necessary.  She says that she has discussed with her husband end-of-life decisions and that he knows what she would want.  Patient says at this point she thinks she would want an attempt at resuscitation but would likely opt to forego that in the event that it appeared unlikely to result in a good outcome.  I described a MOST Form and will complete at time of a future visit.  Assessment and Plan: -Continue current scope of treatment -Full code -Complete MOST form at time of a future visit   Follow Up Instructions: RTC in 2 weeks    I discussed the assessment and treatment plan with the patient. The patient was provided an opportunity to ask questions and all were answered. The patient agreed with the plan and demonstrated an understanding of the instructions.   The patient was advised to call back or seek an in-person evaluation if the symptoms worsen or if the condition fails to improve as anticipated.  I provided 10 minutes of non-face-to-face time during this encounter.   Irean Hong, NP

## 2018-10-28 ENCOUNTER — Ambulatory Visit
Admission: RE | Admit: 2018-10-28 | Discharge: 2018-10-28 | Disposition: A | Discharge status: Home / Self Care | Payer: Medicare Other | Source: Ambulatory Visit | Attending: Oncology | Admitting: Oncology

## 2018-10-28 ENCOUNTER — Telehealth: Payer: Self-pay

## 2018-10-28 ENCOUNTER — Other Ambulatory Visit: Payer: Self-pay

## 2018-10-28 ENCOUNTER — Inpatient Hospital Stay: Payer: Medicare Other

## 2018-10-28 ENCOUNTER — Other Ambulatory Visit: Payer: Self-pay | Admitting: Oncology

## 2018-10-28 DIAGNOSIS — D696 Thrombocytopenia, unspecified: Secondary | ICD-10-CM | POA: Insufficient documentation

## 2018-10-28 DIAGNOSIS — E871 Hypo-osmolality and hyponatremia: Secondary | ICD-10-CM

## 2018-10-28 DIAGNOSIS — Z5111 Encounter for antineoplastic chemotherapy: Secondary | ICD-10-CM | POA: Diagnosis not present

## 2018-10-28 DIAGNOSIS — R4182 Altered mental status, unspecified: Secondary | ICD-10-CM | POA: Diagnosis present

## 2018-10-28 DIAGNOSIS — Z95828 Presence of other vascular implants and grafts: Secondary | ICD-10-CM

## 2018-10-28 LAB — COMPREHENSIVE METABOLIC PANEL
ALT: 11 U/L (ref 0–44)
AST: 22 U/L (ref 15–41)
Albumin: 3.3 g/dL — ABNORMAL LOW (ref 3.5–5.0)
Alkaline Phosphatase: 94 U/L (ref 38–126)
Anion gap: 7 (ref 5–15)
BUN: 6 mg/dL — ABNORMAL LOW (ref 8–23)
CO2: 24 mmol/L (ref 22–32)
Calcium: 8.8 mg/dL — ABNORMAL LOW (ref 8.9–10.3)
Chloride: 103 mmol/L (ref 98–111)
Creatinine, Ser: 0.43 mg/dL — ABNORMAL LOW (ref 0.44–1.00)
GFR calc Af Amer: >60 mL/min (ref >60)
GFR calc non Af Amer: >60 mL/min (ref >60)
Glucose, Bld: 98 mg/dL (ref 70–99)
Potassium: 3.1 mmol/L — ABNORMAL LOW (ref 3.5–5.1)
Sodium: 134 mmol/L — ABNORMAL LOW (ref 135–145)
Total Bilirubin: 1.2 mg/dL (ref 0.3–1.2)
Total Protein: 6.2 g/dL — ABNORMAL LOW (ref 6.5–8.1)

## 2018-10-28 LAB — CBC WITH DIFFERENTIAL/PLATELET
Abs Immature Granulocytes: 0.08 10*3/uL — ABNORMAL HIGH (ref 0.00–0.07)
Basophils Absolute: 0 10*3/uL (ref 0.0–0.1)
Basophils Relative: 1 %
Eosinophils Absolute: 0 10*3/uL (ref 0.0–0.5)
Eosinophils Relative: 1 %
HCT: 25 % — ABNORMAL LOW (ref 36.0–46.0)
Hemoglobin: 8.3 g/dL — ABNORMAL LOW (ref 12.0–15.0)
Immature Granulocytes: 5 %
Lymphocytes Relative: 47 %
Lymphs Abs: 0.8 10*3/uL (ref 0.7–4.0)
MCH: 29.2 pg (ref 26.0–34.0)
MCHC: 33.2 g/dL (ref 30.0–36.0)
MCV: 88 fL (ref 80.0–100.0)
Monocytes Absolute: 0.2 10*3/uL (ref 0.1–1.0)
Monocytes Relative: 10 %
Neutro Abs: 0.6 10*3/uL — ABNORMAL LOW (ref 1.7–7.7)
Neutrophils Relative %: 36 %
Platelets: 9 10*3/uL — CL (ref 150–400)
RBC: 2.84 MIL/uL — ABNORMAL LOW (ref 3.87–5.11)
RDW: 21.7 % — ABNORMAL HIGH (ref 11.5–15.5)
WBC: 1.7 10*3/uL — ABNORMAL LOW (ref 4.0–10.5)
nRBC: 0 % (ref 0.0–0.2)

## 2018-10-28 LAB — TYPE AND SCREEN
ABO/RH(D): A POS
Antibody Screen: NEGATIVE
Unit division: 0

## 2018-10-28 LAB — BPAM RBC
Blood Product Expiration Date: 202006042359
ISSUE DATE / TIME: 202005201332
Unit Type and Rh: 6200

## 2018-10-28 LAB — MAGNESIUM: Magnesium: 1.7 mg/dL (ref 1.7–2.4)

## 2018-10-28 MED ORDER — DIPHENHYDRAMINE HCL 25 MG PO CAPS
25.0000 mg | ORAL_CAPSULE | Freq: Once | ORAL | Status: AC
  Administered 2018-10-28: 25 mg via ORAL
  Filled 2018-10-28: qty 1

## 2018-10-28 MED ORDER — SODIUM CHLORIDE 0.9 % IV SOLN
40.0000 meq | Freq: Once | INTRAVENOUS | Status: AC
  Administered 2018-10-28: 40 meq via INTRAVENOUS
  Filled 2018-10-28: qty 20

## 2018-10-28 MED ORDER — SODIUM CHLORIDE 0.9% FLUSH
10.0000 mL | Freq: Once | INTRAVENOUS | Status: AC
  Administered 2018-10-28: 10:00:00 10 mL via INTRAVENOUS
  Filled 2018-10-28: qty 10

## 2018-10-28 MED ORDER — ACETAMINOPHEN 325 MG PO TABS
650.0000 mg | ORAL_TABLET | Freq: Once | ORAL | Status: AC
  Administered 2018-10-28: 650 mg via ORAL
  Filled 2018-10-28: qty 2

## 2018-10-28 MED ORDER — SODIUM CHLORIDE 0.9 % IV SOLN
Freq: Once | INTRAVENOUS | Status: AC
  Administered 2018-10-28: 11:00:00 via INTRAVENOUS
  Filled 2018-10-28: qty 250

## 2018-10-28 MED ORDER — SODIUM CHLORIDE 0.9% IV SOLUTION
250.0000 mL | Freq: Once | INTRAVENOUS | Status: AC
  Administered 2018-10-28: 14:00:00 250 mL via INTRAVENOUS
  Filled 2018-10-28: qty 250

## 2018-10-28 MED ORDER — HEPARIN SOD (PORK) LOCK FLUSH 100 UNIT/ML IV SOLN
500.0000 [IU] | Freq: Once | INTRAVENOUS | Status: AC | PRN
  Administered 2018-10-28: 500 [IU]
  Filled 2018-10-28: qty 5

## 2018-10-28 NOTE — Progress Notes (Signed)
Reviewed labs with Dr. Tasia Catchings. Per Dr. Tasia Catchings pt to receive 40 MEQs of IV potassium over 2 hours and one unit of platelets.

## 2018-10-28 NOTE — Addendum Note (Signed)
Addended by: Mallie Snooks I on: 10/28/2018 10:17 AM   Modules accepted: Orders

## 2018-10-28 NOTE — Telephone Encounter (Signed)
Nutrition  Patient called with nutrition related questions and left message for RD.    RD called patient back and answered questions to patient's satisfaction.    Kainat Pizana B. Zenia Resides, [place], Real Registered Dietitian [phone] (pager)

## 2018-10-29 ENCOUNTER — Inpatient Hospital Stay: Payer: Medicare Other

## 2018-10-29 ENCOUNTER — Other Ambulatory Visit: Payer: Self-pay

## 2018-10-29 ENCOUNTER — Other Ambulatory Visit: Payer: Self-pay | Admitting: Oncology

## 2018-10-29 DIAGNOSIS — D696 Thrombocytopenia, unspecified: Secondary | ICD-10-CM

## 2018-10-29 DIAGNOSIS — E871 Hypo-osmolality and hyponatremia: Secondary | ICD-10-CM

## 2018-10-29 DIAGNOSIS — Z5111 Encounter for antineoplastic chemotherapy: Secondary | ICD-10-CM | POA: Diagnosis not present

## 2018-10-29 LAB — COMPREHENSIVE METABOLIC PANEL
ALT: 11 U/L (ref 0–44)
AST: 20 U/L (ref 15–41)
Albumin: 3.2 g/dL — ABNORMAL LOW (ref 3.5–5.0)
Alkaline Phosphatase: 92 U/L (ref 38–126)
Anion gap: 9 (ref 5–15)
BUN: <5 mg/dL — ABNORMAL LOW (ref 8–23)
CO2: 23 mmol/L (ref 22–32)
Calcium: 8.8 mg/dL — ABNORMAL LOW (ref 8.9–10.3)
Chloride: 103 mmol/L (ref 98–111)
Creatinine, Ser: 0.49 mg/dL (ref 0.44–1.00)
GFR calc Af Amer: >60 mL/min (ref >60)
GFR calc non Af Amer: >60 mL/min (ref >60)
Glucose, Bld: 91 mg/dL (ref 70–99)
Potassium: 3.2 mmol/L — ABNORMAL LOW (ref 3.5–5.1)
Sodium: 135 mmol/L (ref 135–145)
Total Bilirubin: 1 mg/dL (ref 0.3–1.2)
Total Protein: 6.2 g/dL — ABNORMAL LOW (ref 6.5–8.1)

## 2018-10-29 LAB — PREPARE PLATELET PHERESIS: Unit division: 0

## 2018-10-29 LAB — BPAM PLATELET PHERESIS
Blood Product Expiration Date: 202005242359
ISSUE DATE / TIME: 202005211344
Unit Type and Rh: 7300

## 2018-10-29 LAB — CBC
HCT: 25.6 % — ABNORMAL LOW (ref 36.0–46.0)
Hemoglobin: 8.3 g/dL — ABNORMAL LOW (ref 12.0–15.0)
MCH: 28.7 pg (ref 26.0–34.0)
MCHC: 32.4 g/dL (ref 30.0–36.0)
MCV: 88.6 fL (ref 80.0–100.0)
Platelets: 33 10*3/uL — ABNORMAL LOW (ref 150–400)
RBC: 2.89 MIL/uL — ABNORMAL LOW (ref 3.87–5.11)
RDW: 21.1 % — ABNORMAL HIGH (ref 11.5–15.5)
WBC: 1.8 10*3/uL — ABNORMAL LOW (ref 4.0–10.5)
nRBC: 0 % (ref 0.0–0.2)

## 2018-10-29 MED ORDER — SODIUM CHLORIDE 0.9% FLUSH
10.0000 mL | Freq: Once | INTRAVENOUS | Status: AC
  Administered 2018-10-29: 10 mL via INTRAVENOUS
  Filled 2018-10-29: qty 10

## 2018-10-29 MED ORDER — POTASSIUM CHLORIDE 20 MEQ/100ML IV SOLN
20.0000 meq | Freq: Once | INTRAVENOUS | Status: AC
  Administered 2018-10-29: 20 meq via INTRAVENOUS

## 2018-10-29 MED ORDER — SODIUM CHLORIDE 0.9 % IV SOLN
Freq: Once | INTRAVENOUS | Status: AC
  Administered 2018-10-29: 14:00:00 via INTRAVENOUS
  Filled 2018-10-29: qty 250

## 2018-10-29 MED ORDER — HEPARIN SOD (PORK) LOCK FLUSH 100 UNIT/ML IV SOLN: 500.0000 [IU] | Freq: Once | INTRAVENOUS | Status: DC | PRN

## 2018-10-29 MED ORDER — HEPARIN SOD (PORK) LOCK FLUSH 100 UNIT/ML IV SOLN
500.0000 [IU] | Freq: Once | INTRAVENOUS | Status: AC
  Administered 2018-10-29: 15:00:00 500 [IU] via INTRAVENOUS
  Filled 2018-10-29: qty 5

## 2018-11-02 ENCOUNTER — Other Ambulatory Visit: Payer: Self-pay

## 2018-11-02 ENCOUNTER — Inpatient Hospital Stay: Payer: Medicare Other

## 2018-11-02 VITALS — BP 135/83 | HR 102 | Temp 97.8°F | Resp 18

## 2018-11-02 DIAGNOSIS — Z5111 Encounter for antineoplastic chemotherapy: Secondary | ICD-10-CM | POA: Diagnosis not present

## 2018-11-02 DIAGNOSIS — E871 Hypo-osmolality and hyponatremia: Secondary | ICD-10-CM

## 2018-11-02 DIAGNOSIS — C250 Malignant neoplasm of head of pancreas: Secondary | ICD-10-CM

## 2018-11-02 DIAGNOSIS — E876 Hypokalemia: Secondary | ICD-10-CM

## 2018-11-02 MED ORDER — SODIUM CHLORIDE 0.9 % IV SOLN
Freq: Once | INTRAVENOUS | Status: AC
  Administered 2018-11-02: 13:00:00 via INTRAVENOUS
  Filled 2018-11-02: qty 250

## 2018-11-02 MED ORDER — HEPARIN SOD (PORK) LOCK FLUSH 100 UNIT/ML IV SOLN
500.0000 [IU] | Freq: Once | INTRAVENOUS | Status: AC | PRN
  Administered 2018-11-02: 500 [IU]

## 2018-11-02 MED ORDER — HEPARIN SOD (PORK) LOCK FLUSH 100 UNIT/ML IV SOLN
INTRAVENOUS | Status: AC
  Filled 2018-11-02: qty 5

## 2018-11-03 ENCOUNTER — Inpatient Hospital Stay: Payer: Medicare Other

## 2018-11-03 ENCOUNTER — Inpatient Hospital Stay (HOSPITAL_BASED_OUTPATIENT_CLINIC_OR_DEPARTMENT_OTHER): Payer: Medicare Other | Admitting: Oncology

## 2018-11-03 ENCOUNTER — Other Ambulatory Visit: Payer: Self-pay

## 2018-11-03 ENCOUNTER — Other Ambulatory Visit: Payer: Self-pay | Admitting: *Deleted

## 2018-11-03 ENCOUNTER — Encounter: Payer: Self-pay | Admitting: Oncology

## 2018-11-03 VITALS — BP 120/79 | HR 106 | Temp 97.6°F | Wt 138.1 lb

## 2018-11-03 DIAGNOSIS — E871 Hypo-osmolality and hyponatremia: Secondary | ICD-10-CM

## 2018-11-03 DIAGNOSIS — Z5111 Encounter for antineoplastic chemotherapy: Secondary | ICD-10-CM | POA: Diagnosis not present

## 2018-11-03 DIAGNOSIS — D696 Thrombocytopenia, unspecified: Secondary | ICD-10-CM

## 2018-11-03 DIAGNOSIS — E876 Hypokalemia: Secondary | ICD-10-CM | POA: Diagnosis not present

## 2018-11-03 DIAGNOSIS — E039 Hypothyroidism, unspecified: Secondary | ICD-10-CM

## 2018-11-03 DIAGNOSIS — Z79899 Other long term (current) drug therapy: Secondary | ICD-10-CM

## 2018-11-03 DIAGNOSIS — C250 Malignant neoplasm of head of pancreas: Secondary | ICD-10-CM

## 2018-11-03 DIAGNOSIS — D6959 Other secondary thrombocytopenia: Secondary | ICD-10-CM

## 2018-11-03 DIAGNOSIS — K219 Gastro-esophageal reflux disease without esophagitis: Secondary | ICD-10-CM

## 2018-11-03 DIAGNOSIS — Z87891 Personal history of nicotine dependence: Secondary | ICD-10-CM

## 2018-11-03 DIAGNOSIS — I10 Essential (primary) hypertension: Secondary | ICD-10-CM

## 2018-11-03 DIAGNOSIS — T451X5S Adverse effect of antineoplastic and immunosuppressive drugs, sequela: Secondary | ICD-10-CM

## 2018-11-03 LAB — CBC WITH DIFFERENTIAL/PLATELET
Abs Immature Granulocytes: 0.59 10*3/uL — ABNORMAL HIGH (ref 0.00–0.07)
Basophils Absolute: 0.1 10*3/uL (ref 0.0–0.1)
Basophils Relative: 1 %
Eosinophils Absolute: 0 10*3/uL (ref 0.0–0.5)
Eosinophils Relative: 0 %
HCT: 26.8 % — ABNORMAL LOW (ref 36.0–46.0)
Hemoglobin: 8.6 g/dL — ABNORMAL LOW (ref 12.0–15.0)
Immature Granulocytes: 4 %
Lymphocytes Relative: 13 %
Lymphs Abs: 1.9 10*3/uL (ref 0.7–4.0)
MCH: 29.5 pg (ref 26.0–34.0)
MCHC: 32.1 g/dL (ref 30.0–36.0)
MCV: 91.8 fL (ref 80.0–100.0)
Monocytes Absolute: 1.1 10*3/uL — ABNORMAL HIGH (ref 0.1–1.0)
Monocytes Relative: 8 %
Neutro Abs: 10.7 10*3/uL — ABNORMAL HIGH (ref 1.7–7.7)
Neutrophils Relative %: 74 %
Platelets: 56 10*3/uL — ABNORMAL LOW (ref 150–400)
RBC: 2.92 MIL/uL — ABNORMAL LOW (ref 3.87–5.11)
RDW: 21.1 % — ABNORMAL HIGH (ref 11.5–15.5)
Smear Review: NORMAL
WBC Morphology: INCREASED
WBC: 14.4 10*3/uL — ABNORMAL HIGH (ref 4.0–10.5)
nRBC: 0.2 % (ref 0.0–0.2)

## 2018-11-03 LAB — SAMPLE TO BLOOD BANK

## 2018-11-03 LAB — COMPREHENSIVE METABOLIC PANEL
ALT: 10 U/L (ref 0–44)
AST: 22 U/L (ref 15–41)
Albumin: 3.1 g/dL — ABNORMAL LOW (ref 3.5–5.0)
Alkaline Phosphatase: 112 U/L (ref 38–126)
Anion gap: 10 (ref 5–15)
BUN: <5 mg/dL — ABNORMAL LOW (ref 8–23)
CO2: 22 mmol/L (ref 22–32)
Calcium: 9.5 mg/dL (ref 8.9–10.3)
Chloride: 104 mmol/L (ref 98–111)
Creatinine, Ser: 0.53 mg/dL (ref 0.44–1.00)
GFR calc Af Amer: >60 mL/min (ref >60)
GFR calc non Af Amer: >60 mL/min (ref >60)
Glucose, Bld: 112 mg/dL — ABNORMAL HIGH (ref 70–99)
Potassium: 3 mmol/L — ABNORMAL LOW (ref 3.5–5.1)
Sodium: 136 mmol/L (ref 135–145)
Total Bilirubin: 0.6 mg/dL (ref 0.3–1.2)
Total Protein: 5.8 g/dL — ABNORMAL LOW (ref 6.5–8.1)

## 2018-11-03 LAB — MAGNESIUM: Magnesium: 1.6 mg/dL — ABNORMAL LOW (ref 1.7–2.4)

## 2018-11-03 MED ORDER — SODIUM CHLORIDE 0.9% FLUSH
10.0000 mL | Freq: Once | INTRAVENOUS | Status: AC
  Administered 2018-11-03: 09:00:00 10 mL via INTRAVENOUS
  Filled 2018-11-03: qty 10

## 2018-11-03 MED ORDER — HEPARIN SOD (PORK) LOCK FLUSH 100 UNIT/ML IV SOLN
500.0000 [IU] | Freq: Once | INTRAVENOUS | Status: AC
  Administered 2018-11-03: 500 [IU] via INTRAVENOUS

## 2018-11-03 MED ORDER — SODIUM CHLORIDE 0.9 % IV SOLN
Freq: Once | INTRAVENOUS | Status: AC
  Administered 2018-11-03: 11:00:00 via INTRAVENOUS
  Filled 2018-11-03: qty 250

## 2018-11-03 MED ORDER — SODIUM CHLORIDE 0.9 % IV SOLN: 2.0000 g | Freq: Once | INTRAVENOUS | Status: DC

## 2018-11-03 MED ORDER — HEPARIN SOD (PORK) LOCK FLUSH 100 UNIT/ML IV SOLN
500.0000 [IU] | Freq: Once | INTRAVENOUS | Status: DC | PRN
  Filled 2018-11-03: qty 5

## 2018-11-03 MED ORDER — SODIUM CHLORIDE 0.9 % IV SOLN: 40.0000 meq | Freq: Once | INTRAVENOUS | Status: DC

## 2018-11-03 MED ORDER — POTASSIUM CHLORIDE CRYS ER 20 MEQ PO TBCR: 40.0000 meq | EXTENDED_RELEASE_TABLET | Freq: Every day | ORAL | 0 refills | Status: DC

## 2018-11-03 NOTE — Progress Notes (Addendum)
Hematology/Oncology Follow Up Note [facility]  Telephone:([phone] [phone]  Patient Care Team: Jodelle Green, FNP as PCP - General (Family Medicine) Clent Jacks, RN as Registered Nurse   Name of the patient: Lynn Taylor  [id number]  June 24, 1945   REASON FOR VISIT  follow-up for adjuvant chemotherapy for pancreatic cancer.  HISTORY OF PRESENTING ILLNESS:  Lynn Taylor is a  73 y.o.  female with PMH listed below who was referred to me for evaluation of evaluation of elevated ferritin. Patient recently had lab work-up done on 04/09/2018 which showed ferritin level 1877, iron 97, TIBC 340, iron saturation 29, 04/11/2018 CBC showed hemoglobin 13.2, MCV 81, WBC 7.6, platelet counts 246,000. Patient was referred to hematology for further evaluation of elevated ferritin.  #04/28/2018 Ultrasound of liver was obtained which showed CBD and intrahepatic biliary dilatation Patient wa she is s advised to go to emergency room for further evaluation. Also had hyperbilirubinemia.  Patient was complaining about being jaundiced, pruritus all over. MR abdomen MRCP with and without contrast showed market biliary duct dilatation, secondary to obstruction in the region of pancreatic head.  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 No abdominal adenopathy, liver metastasis or cross vascular involvement.  patient was evaluated by gastroenterology and status post ERCP with stenting. CA 19.9 1173 CEA 3.9 # 05/25/2018  patient was referred to [facility] and s/p whipple resection.Her postop course was c/b Type A pancreatic leak and c.diff. Pathology showed A. Hepatic artery lymph node, excision: One lymph node, negative for malignancy (0/1). B. Biliary stent, removal: Medical device, consistent with stent, gross examination only. C. Head of pancreas, duodenum, portion of stomach, pancreaticoduodenectomy (Whipple):  Pancreatic ductal adenocarcinoma, poorly differentiated, 3.2 cm, uncinate, confined to pancreas. All margins are negative (closest margin=uncinate, 0.5 mm) Metastatic adenocarcinoma in one of thirty-two lymph nodes (1/32).  Portion of stomach and duodenum with no specific pathologic diagnosis.   Grade, 3 poorly differentiated.  Pathologic pT2 pN1  Cancer Treatment 07/13/2018 Cycle 1 FOLFIRINOX with Onpro Neulasta.  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 10/06/2018 cycle 6  mFOLFIRINOX with Onpro Neulasta - [50% dose for Irinotecan- 5-FU bolus omitted]. 10/20/2018 cycle 7 mFOLFIRINOX with Onpro Neulasta - [50% dose for Irinotecan- 5-FU bolus omitted].  INTERVAL HISTORY Lynn Taylor is a 73 y.o. female with history of stage IIb pancreatic cancer presents for evaluation prior to adjuvant chemotherapy FOLFIRI Geralynn Ochs Patient has been having moderate difficulties in tolerating FOLFIRI [place]. She denies nausea, vomiting,  no fever or chills. She has had chronic intermittent diarrhea, average 1-2 episodes a day.  She uses Imodium as instructed. Appetite is very poor.  Has been taking Megace 40 mg 4 times daily.  Has gained some weight since last visit. Patient had an episode of stuttering last week.  CT head reveals no acute intracranial process.  She tells me that when she was young, she used to have problems with stuttering.  Grade 4 thrombocytopenia received platelet transfusion.  No bleeding effects. Today  patient's reports feeling better.  Review of Systems  Constitutional: Positive for appetite change and fatigue. Negative for chills and fever.  HENT:   Negative for hearing loss and voice change.   Eyes: Negative for eye problems.  Respiratory: Negative for chest tightness, cough and shortness of breath.   Cardiovascular: Negative for chest pain.  Gastrointestinal: Negative for abdominal distention, abdominal pain and blood in stool.  Endocrine: Negative for hot flashes.  Genitourinary: Negative for difficulty urinating and frequency.   Musculoskeletal: Negative for arthralgias.  Skin: Negative for itching and rash.  Neurological: Negative for extremity weakness.  Hematological: Negative for adenopathy.  Psychiatric/Behavioral: Negative for confusion.      No Known Allergies   Past Medical History:  Diagnosis Date  . Cancer ([place])   . GERD (gastroesophageal reflux disease)   . Hypertension   . Hypothyroidism   . Malignant neoplasm of head of pancreas ([place]) 06/08/2018     Past Surgical History:  Procedure Laterality Date  . CHOLECYSTECTOMY    . COLONOSCOPY N/A 12/02/2016   Procedure: COLONOSCOPY;  Surgeon: Lollie Sails, MD;  Location: [facility] ENDOSCOPY;  Service: Endoscopy;  Laterality: N/A;  . ERCP N/A 04/30/2018   Procedure: ENDOSCOPIC RETROGRADE CHOLANGIOPANCREATOGRAPHY (ERCP);  Surgeon: Lucilla Lame, MD;  Location: [facility] ENDOSCOPY;  Service: Endoscopy;  Laterality: N/A;  . ERCP N/A 05/05/2018   Procedure: ENDOSCOPIC RETROGRADE CHOLANGIOPANCREATOGRAPHY (ERCP);  Surgeon: Lucilla Lame, MD;  Location: [facility] ENDOSCOPY;  Service: Endoscopy;  Laterality: N/A;  . PORTA CATH INSERTION N/A 06/28/2018   Procedure: PORTA CATH INSERTION;  Surgeon: Algernon Huxley, MD;  Location: [facility] CV LAB;  Service: Cardiovascular;  Laterality: N/A;    Social History   Socioeconomic History  . Marital status: Married    Spouse name: Not on file  . Number of children: Not on file   . Years of education: Not on file  . Highest education level: Not on file  Occupational History  . Not on file  Social Needs  . Financial resource strain: Not on file  . Food insecurity:    Worry: Not on file    Inability: Not on file  . Transportation needs:    Medical: Not on file    Non-medical: Not on file  Tobacco Use  . Smoking status: Former Smoker    Last attempt to quit: 04/14/1988    Years since quitting: 30.5  . Smokeless tobacco: Never Used  Substance and Sexual Activity  . Alcohol use: Yes    Comment: social drinker  . Drug use: No  . Sexual activity: Not Currently  Lifestyle  . Physical activity:    Days per week: Not on file    Minutes per session: Not on file  . Stress: Not on file  Relationships  . Social connections:    Talks on phone: Not on file    Gets together: Not on file    Attends religious service: Not on file    Active member of club or organization: Not on file    Attends meetings of clubs or organizations: Not on file    Relationship status: Not on file  . Intimate partner violence:    Fear of current or ex partner: Not on file    Emotionally abused: Not on file    Physically abused: Not on file    Forced sexual activity: Not on file  Other Topics Concern  . Not on file  Social History Narrative  . Not on file    Family History  Problem Relation  Age of Onset  . Diabetes Mellitus I Other   . Alcoholism Other   . Hypertension Other   . Hyperlipidemia Other   . Coronary artery disease Other   . Stroke Other   . Osteoarthritis Other   . Migraines Other   . Heart Problems Mother   . Stroke Sister   . CAD Brother   . Stroke Brother   . Breast cancer Neg Hx      Current Outpatient Medications:  .  chlorhexidine (PERIDEX) 0.12 % solution, Use as directed 15 mLs in the mouth or throat 2 (two) times daily., Disp: 473 mL, Rfl: 3 .  Cholecalciferol (VITAMIN D-3) 25 MCG (1000 UT) CAPS, Take by mouth., Disp: , Rfl:  .  dexamethasone  (DECADRON) 4 MG tablet, Take 1 tablet (4 mg total) by mouth 1 day or 1 dose., Disp: 30 tablet, Rfl: 1 .  levothyroxine (SYNTHROID, LEVOTHROID) 100 MCG tablet, Take 1 tablet (100 mcg total) by mouth daily before breakfast., Disp: 90 tablet, Rfl: 4 .  lidocaine-prilocaine (EMLA) cream, Apply 1 application topically as needed., Disp: 30 g, Rfl: 3 .  loperamide (IMODIUM) 2 MG capsule, Take 1 capsule (2 mg total) by mouth See admin instructions. With onset of loose stool, take 4mg  followed by 2mg  every 2 hours until 12 hours have passed without loose bowel movement. Maximum: 16 mg/day, Disp: 120 capsule, Rfl: 1 .  loratadine (CLARITIN) 10 MG tablet, Take 10 mg by mouth daily., Disp: , Rfl:  .  Magnesium Cl-Calcium Carbonate (SLOW-MAG PO), Take 2 tablets by mouth daily., Disp: , Rfl:  .  megestrol (MEGACE) 40 MG tablet, Take 1 tablet (40 mg total) by mouth 4 (four) times daily., Disp: 120 tablet, Rfl: 0 .  nystatin (MYCOSTATIN) 100000 UNIT/ML suspension, Take 5 mLs (500,000 Units total) by mouth 4 (four) times daily., Disp: 473 mL, Rfl: 1 .  omeprazole (PRILOSEC) 20 MG capsule, Take 1 capsule by mouth once daily, Disp: 90 capsule, Rfl: 0 .  ondansetron (ZOFRAN) 4 MG tablet, Take 1 tablet (4 mg total) by mouth every 6 (six) hours as needed for nausea or vomiting., Disp: 30 tablet, Rfl: 3 .  potassium chloride SA (K-DUR) 20 MEQ tablet, Take 2 tablets (40 mEq total) by mouth daily., Disp: 60 tablet, Rfl: 0 .  prochlorperazine (COMPAZINE) 10 MG tablet, Take 1 tablet (10 mg total) by mouth every 6 (six) hours as needed (NAUSEA)., Disp: 30 tablet, Rfl: 1  Physical exam: ECOG 2 Vitals:   11/03/18 0850  BP: 120/79  Pulse: (!) 106  Temp: 97.6 F (36.4 C)  Weight: 138 lb 1.6 oz (62.6 kg)   Physical Exam Constitutional:      General: She is not in acute distress.    Comments: Very thin,   HENT:     Head: Normocephalic and atraumatic.  Eyes:     General: No scleral icterus.    Pupils: Pupils are equal,  round, and reactive to light.  Neck:     Musculoskeletal: Normal range of motion and neck supple.  Cardiovascular:     Rate and Rhythm: Normal rate and regular rhythm.     Heart sounds: Normal heart sounds.  Pulmonary:     Effort: Pulmonary effort is normal. No respiratory distress.     Breath sounds: No wheezing.  Abdominal:     General: Bowel sounds are normal. There is no distension.     Palpations: Abdomen is soft. There is no mass.     Tenderness:  There is no abdominal tenderness.  Musculoskeletal: Normal range of motion.        General: No deformity.  Skin:    General: Skin is warm and dry.     Findings: No erythema or rash.  Neurological:     Mental Status: She is alert and oriented to person, place, and time.     Cranial Nerves: No cranial nerve deficit.     Coordination: Coordination normal.  Psychiatric:        Behavior: Behavior normal.        Thought Content: Thought content normal.     CMP Latest Ref Rng & Units 11/03/2018  Glucose 70 - 99 mg/dL 112(H)  BUN 8 - 23 mg/dL <5(L)  Creatinine 0.44 - 1.00 mg/dL 0.53  Sodium 135 - 145 mmol/L 136  Potassium 3.5 - 5.1 mmol/L 3.0(L)  Chloride 98 - 111 mmol/L 104  CO2 22 - 32 mmol/L 22  Calcium 8.9 - 10.3 mg/dL 9.5  Total Protein 6.5 - 8.1 g/dL 5.8(L)  Total Bilirubin 0.3 - 1.2 mg/dL 0.6  Alkaline Phos 38 - 126 U/L 112  AST 15 - 41 U/L 22  ALT 0 - 44 U/L 10   CBC Latest Ref Rng & Units 11/03/2018  WBC 4.0 - 10.5 K/uL 14.4(H)  Hemoglobin 12.0 - 15.0 g/dL 8.6(L)  Hematocrit 36.0 - 46.0 % 26.8(L)  Platelets 150 - 400 K/uL 56(L)    Ct Head Wo Contrast  Result Date: 10/28/2018 CLINICAL DATA:  Altered mental status, dizziness and blurred vision since yesterday. History of pancreatic cancer. EXAM: CT HEAD WITHOUT CONTRAST TECHNIQUE: Contiguous axial images were obtained from the base of the skull through the vertex without intravenous contrast. COMPARISON:  February 03, 2011 FINDINGS: Brain: No evidence of acute infarction,  hemorrhage, hydrocephalus, extra-axial collection or mass lesion/mass effect. There is chronic diffuse atrophy. Vascular: No hyperdense vessel is noted. Skull: Normal. Negative for fracture or focal lesion. Sinuses/Orbits: No acute finding. Other: None. IMPRESSION: No focal acute intracranial abnormality identified. Chronic diffuse atrophy. Electronically Signed   By: Abelardo Diesel M.D.   On: 10/28/2018 09:44     Assessment and plan Patient is a 73 y.o. female history of stage IIb pancreatic cancer present for evaluation prior to adjuvant chemotherapy.    1. Malignant neoplasm of head of pancreas ([place])   2. Hypokalemia   3. Hypomagnesemia   4. Thrombocytopenia ([place])    #Stage IIb pancreatic cancer status post surgical resection. Hold adjuvant chemotherapy mFOLFIRINOX see below. Patient has had a moderate difficulties and did not tolerate last treatment.  #Grade 4 thrombocytopenia, status post platelet transfusion.  Today's platelet counts improved to 56,000. Hold treatment today to allow further improvement of counts. Reduce oxaliplatin to 60 mg/m2 and 5-FU infusion to 75% dose 1800mg /m2 at next cycle of chemo.  #Hypokalemia, she will receive 40 mEq potassium chloride IV x1,  Continue oral potassium supplementation.  40 mEq daily.  Prescription refilled.  #Hypo-magnesium, magnesium 1.6.  Proceed with IV magnesium sulfate 2 g x 1. Patient has been on PPI omeprazole for occasional acid reflux. Advised patient to use omeprazole as needed instead of daily.  This may improve her chronic electrolyte wasting.  Continue supportive care with IVF normal saline 1L/1hour Mondays Wedesdays Fridays.  Repeat labs in 1 week, MD assessment for evaluation of neck cycle of adjuvant chemotherapy   Earlie Server, MD, PhD Hematology Oncology [facility] at [facility] Pager- [phone] 11/03/2018  Addendum, patient wants her medication refills  to be sent to [facility], refills  sent.

## 2018-11-03 NOTE — Progress Notes (Signed)
Patient here for follow up. States she feels "good." Denies nausea, vomitting or diarrhea

## 2018-11-05 ENCOUNTER — Other Ambulatory Visit: Payer: Self-pay

## 2018-11-05 ENCOUNTER — Inpatient Hospital Stay: Payer: Medicare Other

## 2018-11-05 ENCOUNTER — Other Ambulatory Visit: Payer: Medicare Other

## 2018-11-05 VITALS — BP 121/74 | HR 84 | Temp 97.4°F | Resp 20

## 2018-11-05 DIAGNOSIS — C250 Malignant neoplasm of head of pancreas: Secondary | ICD-10-CM

## 2018-11-05 DIAGNOSIS — E876 Hypokalemia: Secondary | ICD-10-CM

## 2018-11-05 DIAGNOSIS — E871 Hypo-osmolality and hyponatremia: Secondary | ICD-10-CM

## 2018-11-05 DIAGNOSIS — D696 Thrombocytopenia, unspecified: Secondary | ICD-10-CM

## 2018-11-05 DIAGNOSIS — Z5111 Encounter for antineoplastic chemotherapy: Secondary | ICD-10-CM | POA: Diagnosis not present

## 2018-11-05 LAB — BASIC METABOLIC PANEL
Anion gap: 9 (ref 5–15)
BUN: 8 mg/dL (ref 8–23)
CO2: 23 mmol/L (ref 22–32)
Calcium: 10 mg/dL (ref 8.9–10.3)
Chloride: 105 mmol/L (ref 98–111)
Creatinine, Ser: 0.67 mg/dL (ref 0.44–1.00)
GFR calc Af Amer: >60 mL/min (ref >60)
GFR calc non Af Amer: >60 mL/min (ref >60)
Glucose, Bld: 99 mg/dL (ref 70–99)
Potassium: 3.9 mmol/L (ref 3.5–5.1)
Sodium: 137 mmol/L (ref 135–145)

## 2018-11-05 LAB — MAGNESIUM: Magnesium: 2 mg/dL (ref 1.7–2.4)

## 2018-11-05 MED ORDER — SODIUM CHLORIDE 0.9% FLUSH
10.0000 mL | Freq: Once | INTRAVENOUS | Status: AC | PRN
  Administered 2018-11-05: 10 mL
  Filled 2018-11-05: qty 10

## 2018-11-05 MED ORDER — OMEPRAZOLE 20 MG PO CPDR: 20.0000 mg | DELAYED_RELEASE_CAPSULE | Freq: Every day | ORAL | 0 refills | Status: DC

## 2018-11-05 MED ORDER — ONDANSETRON HCL 4 MG PO TABS: 4.0000 mg | ORAL_TABLET | Freq: Four times a day (QID) | ORAL | 1 refills | Status: DC | PRN

## 2018-11-05 MED ORDER — HEPARIN SOD (PORK) LOCK FLUSH 100 UNIT/ML IV SOLN
500.0000 [IU] | Freq: Once | INTRAVENOUS | Status: AC | PRN
  Administered 2018-11-05: 500 [IU]
  Filled 2018-11-05: qty 5

## 2018-11-05 MED ORDER — NYSTATIN 100000 UNIT/ML MT SUSP: 5.0000 mL | Freq: Four times a day (QID) | OROMUCOSAL | 1 refills | Status: DC

## 2018-11-05 MED ORDER — LIDOCAINE-PRILOCAINE 2.5-2.5 % EX CREA: 1.0000 "application " | TOPICAL_CREAM | CUTANEOUS | 3 refills | Status: DC | PRN

## 2018-11-05 MED ORDER — SODIUM CHLORIDE 0.9 % IV SOLN
Freq: Once | INTRAVENOUS | Status: AC
  Administered 2018-11-05: 1000 mL via INTRAVENOUS
  Filled 2018-11-05: qty 250

## 2018-11-05 MED ORDER — DEXAMETHASONE 4 MG PO TABS: 8.0000 mg | ORAL_TABLET | ORAL | 0 refills | Status: DC

## 2018-11-05 MED ORDER — LOPERAMIDE HCL 2 MG PO CAPS: 2.0000 mg | ORAL_CAPSULE | ORAL | 1 refills | Status: DC

## 2018-11-05 MED ORDER — PROCHLORPERAZINE MALEATE 10 MG PO TABS: 10.0000 mg | ORAL_TABLET | Freq: Four times a day (QID) | ORAL | 0 refills | Status: DC | PRN

## 2018-11-05 MED ORDER — CHLORHEXIDINE GLUCONATE 0.12 % MT SOLN: 15.0000 mL | Freq: Two times a day (BID) | OROMUCOSAL | 3 refills | Status: DC

## 2018-11-05 NOTE — Addendum Note (Signed)
Addended by: Earlie Server on: 11/05/2018 03:47 PM   Modules accepted: Orders

## 2018-11-05 NOTE — Telephone Encounter (Signed)
Lynn Taylor I recently refill her medications.

## 2018-11-08 ENCOUNTER — Other Ambulatory Visit: Payer: Self-pay | Admitting: Oncology

## 2018-11-08 ENCOUNTER — Telehealth: Payer: Self-pay | Admitting: Family Medicine

## 2018-11-08 ENCOUNTER — Inpatient Hospital Stay: Payer: Medicare Other | Attending: Oncology

## 2018-11-08 ENCOUNTER — Other Ambulatory Visit: Payer: Self-pay

## 2018-11-08 ENCOUNTER — Inpatient Hospital Stay: Payer: Medicare Other

## 2018-11-08 DIAGNOSIS — C250 Malignant neoplasm of head of pancreas: Secondary | ICD-10-CM | POA: Diagnosis present

## 2018-11-08 DIAGNOSIS — R439 Unspecified disturbances of smell and taste: Secondary | ICD-10-CM | POA: Insufficient documentation

## 2018-11-08 DIAGNOSIS — Z9049 Acquired absence of other specified parts of digestive tract: Secondary | ICD-10-CM | POA: Insufficient documentation

## 2018-11-08 DIAGNOSIS — E440 Moderate protein-calorie malnutrition: Secondary | ICD-10-CM | POA: Insufficient documentation

## 2018-11-08 DIAGNOSIS — E876 Hypokalemia: Secondary | ICD-10-CM | POA: Insufficient documentation

## 2018-11-08 DIAGNOSIS — Z87891 Personal history of nicotine dependence: Secondary | ICD-10-CM | POA: Insufficient documentation

## 2018-11-08 DIAGNOSIS — D6481 Anemia due to antineoplastic chemotherapy: Secondary | ICD-10-CM | POA: Diagnosis not present

## 2018-11-08 DIAGNOSIS — E039 Hypothyroidism, unspecified: Secondary | ICD-10-CM | POA: Diagnosis not present

## 2018-11-08 DIAGNOSIS — R197 Diarrhea, unspecified: Secondary | ICD-10-CM | POA: Diagnosis not present

## 2018-11-08 DIAGNOSIS — I824Y3 Acute embolism and thrombosis of unspecified deep veins of proximal lower extremity, bilateral: Secondary | ICD-10-CM | POA: Insufficient documentation

## 2018-11-08 DIAGNOSIS — T451X5S Adverse effect of antineoplastic and immunosuppressive drugs, sequela: Secondary | ICD-10-CM | POA: Insufficient documentation

## 2018-11-08 DIAGNOSIS — I10 Essential (primary) hypertension: Secondary | ICD-10-CM | POA: Insufficient documentation

## 2018-11-08 DIAGNOSIS — K219 Gastro-esophageal reflux disease without esophagitis: Secondary | ICD-10-CM | POA: Diagnosis not present

## 2018-11-08 DIAGNOSIS — R600 Localized edema: Secondary | ICD-10-CM | POA: Insufficient documentation

## 2018-11-08 DIAGNOSIS — R7989 Other specified abnormal findings of blood chemistry: Secondary | ICD-10-CM | POA: Diagnosis not present

## 2018-11-08 DIAGNOSIS — Z79899 Other long term (current) drug therapy: Secondary | ICD-10-CM | POA: Insufficient documentation

## 2018-11-08 DIAGNOSIS — G620 Drug-induced polyneuropathy: Secondary | ICD-10-CM | POA: Insufficient documentation

## 2018-11-08 DIAGNOSIS — Z452 Encounter for adjustment and management of vascular access device: Secondary | ICD-10-CM | POA: Insufficient documentation

## 2018-11-08 DIAGNOSIS — R5383 Other fatigue: Secondary | ICD-10-CM | POA: Insufficient documentation

## 2018-11-08 DIAGNOSIS — Z95828 Presence of other vascular implants and grafts: Secondary | ICD-10-CM

## 2018-11-08 DIAGNOSIS — D6959 Other secondary thrombocytopenia: Secondary | ICD-10-CM | POA: Diagnosis not present

## 2018-11-08 DIAGNOSIS — R630 Anorexia: Secondary | ICD-10-CM | POA: Insufficient documentation

## 2018-11-08 DIAGNOSIS — E871 Hypo-osmolality and hyponatremia: Secondary | ICD-10-CM | POA: Diagnosis not present

## 2018-11-08 DIAGNOSIS — D696 Thrombocytopenia, unspecified: Secondary | ICD-10-CM

## 2018-11-08 LAB — BASIC METABOLIC PANEL
Anion gap: 8 (ref 5–15)
BUN: 9 mg/dL (ref 8–23)
CO2: 25 mmol/L (ref 22–32)
Calcium: 9.2 mg/dL (ref 8.9–10.3)
Chloride: 101 mmol/L (ref 98–111)
Creatinine, Ser: 0.64 mg/dL (ref 0.44–1.00)
GFR calc Af Amer: >60 mL/min (ref >60)
GFR calc non Af Amer: >60 mL/min (ref >60)
Glucose, Bld: 108 mg/dL — ABNORMAL HIGH (ref 70–99)
Potassium: 3 mmol/L — ABNORMAL LOW (ref 3.5–5.1)
Sodium: 134 mmol/L — ABNORMAL LOW (ref 135–145)

## 2018-11-08 LAB — MAGNESIUM: Magnesium: 1.8 mg/dL (ref 1.7–2.4)

## 2018-11-08 MED ORDER — HEPARIN SOD (PORK) LOCK FLUSH 100 UNIT/ML IV SOLN
INTRAVENOUS | Status: AC
  Filled 2018-11-08: qty 5

## 2018-11-08 MED ORDER — SODIUM CHLORIDE 0.9 % IV SOLN
Freq: Once | INTRAVENOUS | Status: AC
  Administered 2018-11-08: 11:00:00 via INTRAVENOUS
  Filled 2018-11-08: qty 20

## 2018-11-08 MED ORDER — SODIUM CHLORIDE 0.9% FLUSH
10.0000 mL | Freq: Once | INTRAVENOUS | Status: AC
  Administered 2018-11-08: 10 mL via INTRAVENOUS
  Filled 2018-11-08: qty 10

## 2018-11-08 MED ORDER — HEPARIN SOD (PORK) LOCK FLUSH 100 UNIT/ML IV SOLN
500.0000 [IU] | Freq: Once | INTRAVENOUS | Status: AC
  Administered 2018-11-08: 500 [IU] via INTRAVENOUS

## 2018-11-08 NOTE — Telephone Encounter (Signed)
PT order request is fine, please give verbal OK to proceed  Thanks  LG

## 2018-11-08 NOTE — Telephone Encounter (Unsigned)
Copied from [place] [id number]. Topic: Quick Communication - Home Health Verbal Orders >> Nov 08, 2018  8:13 AM Carolyn Stare wrote: Caller/Agency Stacy a PT with [facility]   Number [phone] can leave message    Requesting  req verbal for PT   Frequency 2 x 3    1 x 3

## 2018-11-09 NOTE — Telephone Encounter (Signed)
This looks to have been approved but has not been sent back out to [facility]. Please send to [place] at [facility] in [place]. note and to general advance # at [phone]

## 2018-11-10 ENCOUNTER — Encounter: Payer: Self-pay | Admitting: Oncology

## 2018-11-10 ENCOUNTER — Inpatient Hospital Stay: Payer: Medicare Other

## 2018-11-10 ENCOUNTER — Inpatient Hospital Stay (HOSPITAL_BASED_OUTPATIENT_CLINIC_OR_DEPARTMENT_OTHER): Payer: Medicare Other | Admitting: Oncology

## 2018-11-10 ENCOUNTER — Other Ambulatory Visit: Payer: Self-pay

## 2018-11-10 VITALS — BP 124/80 | HR 101 | Temp 97.4°F | Wt 140.0 lb

## 2018-11-10 DIAGNOSIS — C250 Malignant neoplasm of head of pancreas: Secondary | ICD-10-CM | POA: Diagnosis not present

## 2018-11-10 DIAGNOSIS — D696 Thrombocytopenia, unspecified: Secondary | ICD-10-CM

## 2018-11-10 DIAGNOSIS — Z87891 Personal history of nicotine dependence: Secondary | ICD-10-CM

## 2018-11-10 DIAGNOSIS — Z79899 Other long term (current) drug therapy: Secondary | ICD-10-CM

## 2018-11-10 DIAGNOSIS — D6481 Anemia due to antineoplastic chemotherapy: Secondary | ICD-10-CM

## 2018-11-10 DIAGNOSIS — E039 Hypothyroidism, unspecified: Secondary | ICD-10-CM

## 2018-11-10 DIAGNOSIS — I10 Essential (primary) hypertension: Secondary | ICD-10-CM

## 2018-11-10 DIAGNOSIS — R630 Anorexia: Secondary | ICD-10-CM

## 2018-11-10 DIAGNOSIS — G620 Drug-induced polyneuropathy: Secondary | ICD-10-CM

## 2018-11-10 DIAGNOSIS — E876 Hypokalemia: Secondary | ICD-10-CM

## 2018-11-10 DIAGNOSIS — R5383 Other fatigue: Secondary | ICD-10-CM

## 2018-11-10 DIAGNOSIS — D6959 Other secondary thrombocytopenia: Secondary | ICD-10-CM | POA: Diagnosis not present

## 2018-11-10 DIAGNOSIS — Z9049 Acquired absence of other specified parts of digestive tract: Secondary | ICD-10-CM

## 2018-11-10 DIAGNOSIS — K219 Gastro-esophageal reflux disease without esophagitis: Secondary | ICD-10-CM

## 2018-11-10 DIAGNOSIS — Z95828 Presence of other vascular implants and grafts: Secondary | ICD-10-CM

## 2018-11-10 DIAGNOSIS — T451X5S Adverse effect of antineoplastic and immunosuppressive drugs, sequela: Secondary | ICD-10-CM

## 2018-11-10 DIAGNOSIS — R197 Diarrhea, unspecified: Secondary | ICD-10-CM

## 2018-11-10 DIAGNOSIS — E871 Hypo-osmolality and hyponatremia: Secondary | ICD-10-CM

## 2018-11-10 DIAGNOSIS — G629 Polyneuropathy, unspecified: Secondary | ICD-10-CM

## 2018-11-10 LAB — CBC WITH DIFFERENTIAL/PLATELET
Abs Immature Granulocytes: 0.76 10*3/uL — ABNORMAL HIGH (ref 0.00–0.07)
Basophils Absolute: 0.1 10*3/uL (ref 0.0–0.1)
Basophils Relative: 1 %
Eosinophils Absolute: 0.1 10*3/uL (ref 0.0–0.5)
Eosinophils Relative: 0 %
HCT: 25.8 % — ABNORMAL LOW (ref 36.0–46.0)
Hemoglobin: 8.3 g/dL — ABNORMAL LOW (ref 12.0–15.0)
Immature Granulocytes: 4 %
Lymphocytes Relative: 15 %
Lymphs Abs: 2.5 10*3/uL (ref 0.7–4.0)
MCH: 29.7 pg (ref 26.0–34.0)
MCHC: 32.2 g/dL (ref 30.0–36.0)
MCV: 92.5 fL (ref 80.0–100.0)
Monocytes Absolute: 1.1 10*3/uL — ABNORMAL HIGH (ref 0.1–1.0)
Monocytes Relative: 6 %
Neutro Abs: 12.7 10*3/uL — ABNORMAL HIGH (ref 1.7–7.7)
Neutrophils Relative %: 74 %
Platelets: 180 10*3/uL (ref 150–400)
RBC: 2.79 MIL/uL — ABNORMAL LOW (ref 3.87–5.11)
RDW: 20.8 % — ABNORMAL HIGH (ref 11.5–15.5)
WBC: 17.3 10*3/uL — ABNORMAL HIGH (ref 4.0–10.5)
nRBC: 0 % (ref 0.0–0.2)

## 2018-11-10 LAB — COMPREHENSIVE METABOLIC PANEL
ALT: 15 U/L (ref 0–44)
AST: 29 U/L (ref 15–41)
Albumin: 2.8 g/dL — ABNORMAL LOW (ref 3.5–5.0)
Alkaline Phosphatase: 106 U/L (ref 38–126)
Anion gap: 10 (ref 5–15)
BUN: 9 mg/dL (ref 8–23)
CO2: 23 mmol/L (ref 22–32)
Calcium: 9 mg/dL (ref 8.9–10.3)
Chloride: 103 mmol/L (ref 98–111)
Creatinine, Ser: 0.48 mg/dL (ref 0.44–1.00)
GFR calc Af Amer: >60 mL/min (ref >60)
GFR calc non Af Amer: >60 mL/min (ref >60)
Glucose, Bld: 108 mg/dL — ABNORMAL HIGH (ref 70–99)
Potassium: 3.6 mmol/L (ref 3.5–5.1)
Sodium: 136 mmol/L (ref 135–145)
Total Bilirubin: 0.7 mg/dL (ref 0.3–1.2)
Total Protein: 5.9 g/dL — ABNORMAL LOW (ref 6.5–8.1)

## 2018-11-10 LAB — MAGNESIUM: Magnesium: 1.8 mg/dL (ref 1.7–2.4)

## 2018-11-10 MED ORDER — SODIUM CHLORIDE 0.9% FLUSH
10.0000 mL | Freq: Once | INTRAVENOUS | Status: AC
  Administered 2018-11-10: 10 mL via INTRAVENOUS
  Filled 2018-11-10: qty 10

## 2018-11-10 MED ORDER — GABAPENTIN 300 MG PO CAPS: 300.0000 mg | ORAL_CAPSULE | Freq: Three times a day (TID) | ORAL | 0 refills | Status: DC

## 2018-11-10 MED ORDER — HEPARIN SOD (PORK) LOCK FLUSH 100 UNIT/ML IV SOLN
500.0000 [IU] | Freq: Once | INTRAVENOUS | Status: AC
  Administered 2018-11-10: 500 [IU] via INTRAVENOUS

## 2018-11-10 NOTE — Progress Notes (Signed)
Hematology/Oncology Follow Up Note [facility]  [phone] [phone]  Patient Care Team: Jodelle Green, FNP as PCP - General (Family Medicine) Clent Jacks, RN as Registered Nurse   Name of the patient: Lynn Taylor  [id number]  [birth year]   REASON FOR VISIT  follow-up for adjuvant chemotherapy for pancreatic cancer.  HISTORY OF PRESENTING ILLNESS:  Lynn Taylor is a  73 y.o.  female with PMH listed below who was referred to me for evaluation of evaluation of elevated ferritin. Patient recently had lab work-up done on 04/09/2018 which showed ferritin level 1877, iron 97, TIBC 340, iron saturation 29, 04/11/2018 CBC showed hemoglobin 13.2, MCV 81, WBC 7.6, platelet counts 246,000. Patient was referred to hematology for further evaluation of elevated ferritin.  #04/28/2018 Ultrasound of liver was obtained which showed CBD and intrahepatic biliary dilatation Patient wa she is s advised to go to emergency room for further evaluation. Also had hyperbilirubinemia.  Patient was complaining about being jaundiced, pruritus all over. MR abdomen MRCP with and without contrast showed market biliary duct dilatation, secondary to obstruction in the region of pancreatic head.  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 No abdominal adenopathy, liver metastasis or cross vascular involvement.  patient was evaluated by gastroenterology and status post ERCP with stenting. CA 19.9 1173 CEA 3.9 # 05/25/2018  patient was referred to [facility] and s/p whipple resection.Her postop course was c/b Type A pancreatic leak and c.diff. Pathology showed A. Hepatic artery lymph node, excision: One lymph node, negative for malignancy (0/1). B. Biliary stent, removal: Medical device, consistent with stent, gross examination only. C. Head of pancreas, duodenum, portion of stomach, pancreaticoduodenectomy (Whipple):  Pancreatic ductal adenocarcinoma, poorly differentiated, 3.2 cm, uncinate, confined to pancreas. All margins are negative (closest margin=uncinate, 0.5 mm) Metastatic adenocarcinoma in one of thirty-two lymph nodes (1/32).  Portion of stomach and duodenum with no specific pathologic diagnosis.   Grade, 3 poorly differentiated.  Pathologic pT2 pN1  Cancer Treatment 07/13/2018 Cycle 1 FOLFIRINOX with Onpro Neulasta.  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 10/06/2018 cycle 6  mFOLFIRINOX with Onpro Neulasta - [50% dose for Irinotecan- 5-FU bolus omitted]. 10/20/2018 cycle 7 mFOLFIRINOX with Onpro Neulasta - [50% dose for Irinotecan- 5-FU bolus omitted].  INTERVAL HISTORY Lynn Taylor is a 73 y.o. female with history of stage IIb pancreatic cancer presents for evaluation prior to adjuvant chemotherapy [place] Patient reports feeling "ok". Patient has been having moderate difficulties in tolerating FOFIRINOX. Patient reports chronic intermittent diarrhea, average 1-2 episodes a day.  Uses Imodium as instructed. Appetite is poor.  She has tried Megace 40 mg 4 times daily currently not taking as she feels the Megace may make her hungry but she does not have appetite Patient has gained 2 pounds since last visit 1 week ago. Chemotherapy was held last week due to thrombocytopenia. # Leg and feet numbness and tingling.  No exacerbating or alleviating factors.   Review of Systems  Constitutional: Positive for  appetite change and fatigue. Negative for chills and  fever.  HENT:   Negative for hearing loss and voice change.   Eyes: Negative for eye problems.  Respiratory: Negative for chest tightness, cough and shortness of breath.   Cardiovascular: Negative for chest pain.  Gastrointestinal: Positive for diarrhea. Negative for abdominal distention, abdominal pain and blood in stool.  Endocrine: Negative for hot flashes.  Genitourinary: Negative for difficulty urinating and frequency.   Musculoskeletal: Negative for arthralgias.  Skin: Negative for itching and rash.  Neurological: Negative for extremity weakness.  Hematological: Negative for adenopathy.  Psychiatric/Behavioral: Negative for confusion.      No Known Allergies   Past Medical History:  Diagnosis Date  . Cancer ([place])   . GERD (gastroesophageal reflux disease)   . Hypertension   . Hypothyroidism   . Malignant neoplasm of head of pancreas ([place]) 06/08/2018     Past Surgical History:  Procedure Laterality Date  . CHOLECYSTECTOMY    . COLONOSCOPY N/A 12/02/2016   Procedure: COLONOSCOPY;  Surgeon: Lollie Sails, MD;  Location: [facility] ENDOSCOPY;  Service: Endoscopy;  Laterality: N/A;  . ERCP N/A 04/30/2018   Procedure: ENDOSCOPIC RETROGRADE CHOLANGIOPANCREATOGRAPHY (ERCP);  Surgeon: Lucilla Lame, MD;  Location: [facility] ENDOSCOPY;  Service: Endoscopy;  Laterality: N/A;  . ERCP N/A 05/05/2018   Procedure: ENDOSCOPIC RETROGRADE CHOLANGIOPANCREATOGRAPHY (ERCP);  Surgeon: Lucilla Lame, MD;  Location: [facility] ENDOSCOPY;  Service: Endoscopy;  Laterality: N/A;  . PORTA CATH INSERTION N/A 06/28/2018   Procedure: PORTA CATH INSERTION;  Surgeon: Algernon Huxley, MD;  Location: [facility] CV LAB;  Service: Cardiovascular;  Laterality: N/A;    Social History   Socioeconomic History  . Marital status: Married    Spouse name: Not on file  . Number of children: Not on file  . Years of education: Not on file  . Highest education level: Not on  file  Occupational History  . Not on file  Social Needs  . Financial resource strain: Not on file  . Food insecurity:    Worry: Not on file    Inability: Not on file  . Transportation needs:    Medical: Not on file    Non-medical: Not on file  Tobacco Use  . Smoking status: Former Smoker    Last attempt to quit: 04/14/1988    Years since quitting: 30.5  . Smokeless tobacco: Never Used  Substance and Sexual Activity  . Alcohol use: Yes    Comment: social drinker  . Drug use: No  . Sexual activity: Not Currently  Lifestyle  . Physical activity:    Days per week: Not on file    Minutes per session: Not on file  . Stress: Not on file  Relationships  . Social connections:    Talks on phone: Not on file    Gets together: Not on file    Attends religious service: Not on file    Active member of club or organization: Not on file    Attends meetings of clubs or organizations: Not on file    Relationship status: Not on file  . Intimate partner violence:    Fear of current or ex partner: Not on file    Emotionally abused: Not on file    Physically abused: Not on file    Forced sexual activity: Not on file  Other Topics Concern  . Not on file  Social History Narrative  . Not on file    Family History  Problem Relation Age of Onset  . Diabetes Mellitus I Other   .  Alcoholism Other   . Hypertension Other   . Hyperlipidemia Other   . Coronary artery disease Other   . Stroke Other   . Osteoarthritis Other   . Migraines Other   . Heart Problems Mother   . Stroke Sister   . CAD Brother   . Stroke Brother   . Breast cancer Neg Hx      Current Outpatient Medications:  .  chlorhexidine (PERIDEX) 0.12 % solution, Use as directed 15 mLs in the mouth or throat 2 (two) times daily., Disp: 473 mL, Rfl: 3 .  Cholecalciferol (VITAMIN D-3) 25 MCG (1000 UT) CAPS, Take by mouth., Disp: , Rfl:  .  dexamethasone (DECADRON) 4 MG tablet, Take 2 tablets (8 mg total) by mouth See admin  instructions. Start the day after chemo for 2 days., Disp: 30 tablet, Rfl: 0 .  levothyroxine (SYNTHROID, LEVOTHROID) 100 MCG tablet, Take 1 tablet (100 mcg total) by mouth daily before breakfast., Disp: 90 tablet, Rfl: 4 .  lidocaine-prilocaine (EMLA) cream, Apply 1 application topically as needed., Disp: 30 g, Rfl: 3 .  loperamide (IMODIUM) 2 MG capsule, Take 1 capsule (2 mg total) by mouth See admin instructions. With onset of loose stool, take 4mg  followed by 2mg  every 2 hours until 12 hours have passed without loose bowel movement. Maximum: 16 mg/day, Disp: 120 capsule, Rfl: 1 .  loratadine (CLARITIN) 10 MG tablet, Take 10 mg by mouth daily., Disp: , Rfl:  .  Magnesium Cl-Calcium Carbonate (SLOW-MAG PO), Take 2 tablets by mouth daily., Disp: , Rfl:  .  nystatin (MYCOSTATIN) 100000 UNIT/ML suspension, Take 5 mLs (500,000 Units total) by mouth 4 (four) times daily., Disp: 473 mL, Rfl: 1 .  omeprazole (PRILOSEC) 20 MG capsule, Take 1 capsule (20 mg total) by mouth daily., Disp: 90 capsule, Rfl: 0 .  ondansetron (ZOFRAN) 4 MG tablet, Take 1 tablet (4 mg total) by mouth every 6 (six) hours as needed for nausea or vomiting., Disp: 90 tablet, Rfl: 1 .  potassium chloride SA (K-DUR) 20 MEQ tablet, Take 2 tablets (40 mEq total) by mouth daily., Disp: 60 tablet, Rfl: 0 .  prochlorperazine (COMPAZINE) 10 MG tablet, Take 1 tablet (10 mg total) by mouth every 6 (six) hours as needed (NAUSEA)., Disp: 90 tablet, Rfl: 0 .  gabapentin (NEURONTIN) 300 MG capsule, Take 1 capsule (300 mg total) by mouth 3 (three) times daily., Disp: 90 capsule, Rfl: 0  Physical exam: ECOG 2 Vitals:   11/10/18 0910  BP: 124/80  Pulse: (!) 101  Temp: (!) 97.4 F (36.3 C)  TempSrc: Tympanic  Weight: 140 lb (63.5 kg)   Physical Exam Constitutional:      General: She is not in acute distress.    Comments: Very thin,   HENT:     Head: Normocephalic and atraumatic.  Eyes:     General: No scleral icterus.    Pupils: Pupils  are equal, round, and reactive to light.  Neck:     Musculoskeletal: Normal range of motion and neck supple.  Cardiovascular:     Rate and Rhythm: Normal rate and regular rhythm.     Heart sounds: Normal heart sounds.  Pulmonary:     Effort: Pulmonary effort is normal. No respiratory distress.     Breath sounds: No wheezing.  Abdominal:     General: Bowel sounds are normal. There is no distension.     Palpations: Abdomen is soft. There is no mass.     Tenderness: There is  no abdominal tenderness.  Musculoskeletal: Normal range of motion.        General: No deformity.  Skin:    General: Skin is warm and dry.     Findings: No erythema or rash.  Neurological:     Mental Status: She is alert and oriented to person, place, and time.     Cranial Nerves: No cranial nerve deficit.     Coordination: Coordination normal.  Psychiatric:        Behavior: Behavior normal.        Thought Content: Thought content normal.     CMP Latest Ref Rng & Units 11/10/2018  Glucose 70 - 99 mg/dL 108(H)  BUN 8 - 23 mg/dL 9  Creatinine 0.44 - 1.00 mg/dL 0.48  Sodium 135 - 145 mmol/L 136  Potassium 3.5 - 5.1 mmol/L 3.6  Chloride 98 - 111 mmol/L 103  CO2 22 - 32 mmol/L 23  Calcium 8.9 - 10.3 mg/dL 9.0  Total Protein 6.5 - 8.1 g/dL 5.9(L)  Total Bilirubin 0.3 - 1.2 mg/dL 0.7  Alkaline Phos 38 - 126 U/L 106  AST 15 - 41 U/L 29  ALT 0 - 44 U/L 15   CBC Latest Ref Rng & Units 11/10/2018  WBC 4.0 - 10.5 K/uL 17.3(H)  Hemoglobin 12.0 - 15.0 g/dL 8.3(L)  Hematocrit 36.0 - 46.0 % 25.8(L)  Platelets 150 - 400 K/uL 180   RADIOGRAPHIC STUDIES: I have personally reviewed the radiological images as listed and agreed with the findings in the report.  Ct Head Wo Contrast  Result Date: 10/28/2018 CLINICAL DATA:  Altered mental status, dizziness and blurred vision since yesterday. History of pancreatic cancer. EXAM: CT HEAD WITHOUT CONTRAST TECHNIQUE: Contiguous axial images were obtained from the base of the  skull through the vertex without intravenous contrast. COMPARISON:  February 03, 2011 FINDINGS: Brain: No evidence of acute infarction, hemorrhage, hydrocephalus, extra-axial collection or mass lesion/mass effect. There is chronic diffuse atrophy. Vascular: No hyperdense vessel is noted. Skull: Normal. Negative for fracture or focal lesion. Sinuses/Orbits: No acute finding. Other: None. IMPRESSION: No focal acute intracranial abnormality identified. Chronic diffuse atrophy. Electronically Signed   By: Abelardo Diesel M.D.   On: 10/28/2018 09:44     Assessment and plan Patient is a 73 y.o. female history of stage IIb pancreatic cancer present for evaluation prior to adjuvant chemotherapy.   1. Malignant neoplasm of head of pancreas ([place])   2. Thrombocytopenia ([place])   3. Hyponatremia   4. Hypokalemia   5. Port-A-Cath in place   6. Anemia due to antineoplastic chemotherapy   7. Neuropathy    #Stage IIb pancreatic cancer status post surgical resection. Patient received 7 cycles of mFOLFIRINOX with moderate difficulties. Continue to hold chemotherapy mFOLFIRINOX see below.  #Grade 4 thrombocytopenia, status post platelet transfusion.  Today's platelet counts improved to 1 80,000. Hold treatment today for patient to further improve overall condition. Reduce oxaliplatin to 60 mg/m2 and 5-FU infusion to 75% dose 1800mg /m2 at next cycle of chemo.  #Hypokalemia, continue oral potassium supplementation 59meq daily. #Fatigue-, secondary to chemotherapy.  Will allow another week for her to recover. Plan dose reduced chemotherapy next week. #Weight loss, patient has gained 2 pounds during the interval.  Recommend continue Ensure supplementation.  Follow-up with dietitian.    # Neuropathy, recommend patient to start gabapentin.  Prescription of gabapentin 300 mg 3 times daily sent to pharmacy. Advised patient to start with 300 mg daily if tolerated she may increase dose to 300  mg 2-3 times daily.  Repeat  labs in 1 week, MD assessment for evaluation of neck cycle of adjuvant chemotherapy   Earlie Server, MD, PhD  11/10/2018

## 2018-11-10 NOTE — Progress Notes (Signed)
RN was informed by pt.'s care team that pt is not receiving chemotherapy treatment today and to deaccess pt.'s port. Pt was deaccessed and awaiting ride from husband at this time. All pt.'s questions answered at this time.   Gayleen Sholtz [employer]

## 2018-11-10 NOTE — Progress Notes (Signed)
Patient here today for follow up and chemotherapy.  Patient c/o leg/feet neuropathy and pain.

## 2018-11-11 ENCOUNTER — Telehealth: Payer: Self-pay | Admitting: Family Medicine

## 2018-11-11 NOTE — Telephone Encounter (Signed)
Copied from [place] [id number]. Topic: Quick Communication - Home Health Verbal Orders >> Nov 11, 2018 12:42 PM Margot Ables wrote: Caller/Agency: Izora Gala OT with [facility] Number: [phone] secure VM Requesting OT/PT/Skilled Nursing/Social Work/Speech Therapy: OT Frequency: requesting VO for OT 1 week 2, 2 week 2

## 2018-11-12 ENCOUNTER — Other Ambulatory Visit: Payer: Self-pay | Admitting: *Deleted

## 2018-11-12 MED ORDER — LOPERAMIDE HCL 2 MG PO CAPS: 2.0000 mg | ORAL_CAPSULE | ORAL | 1 refills | Status: DC

## 2018-11-12 NOTE — Telephone Encounter (Signed)
Verbal orders given  

## 2018-11-15 NOTE — Telephone Encounter (Signed)
Verbal order given to Stacy @ [facility], I called [facility] and they stated given the PT order to Marzetta Board was enough because Erline Levine would put the order in.  Jendaya Gossett,cma

## 2018-11-16 ENCOUNTER — Other Ambulatory Visit: Payer: Self-pay

## 2018-11-17 ENCOUNTER — Other Ambulatory Visit: Payer: Self-pay

## 2018-11-17 ENCOUNTER — Encounter: Payer: Self-pay | Admitting: Oncology

## 2018-11-17 ENCOUNTER — Inpatient Hospital Stay (HOSPITAL_BASED_OUTPATIENT_CLINIC_OR_DEPARTMENT_OTHER): Payer: Medicare Other | Admitting: Oncology

## 2018-11-17 ENCOUNTER — Inpatient Hospital Stay: Payer: Medicare Other

## 2018-11-17 VITALS — BP 128/82 | HR 108 | Temp 97.0°F | Resp 18 | Wt 128.1 lb

## 2018-11-17 DIAGNOSIS — Z95828 Presence of other vascular implants and grafts: Secondary | ICD-10-CM

## 2018-11-17 DIAGNOSIS — Z87891 Personal history of nicotine dependence: Secondary | ICD-10-CM

## 2018-11-17 DIAGNOSIS — E871 Hypo-osmolality and hyponatremia: Secondary | ICD-10-CM

## 2018-11-17 DIAGNOSIS — R439 Unspecified disturbances of smell and taste: Secondary | ICD-10-CM

## 2018-11-17 DIAGNOSIS — D6481 Anemia due to antineoplastic chemotherapy: Secondary | ICD-10-CM

## 2018-11-17 DIAGNOSIS — C250 Malignant neoplasm of head of pancreas: Secondary | ICD-10-CM

## 2018-11-17 DIAGNOSIS — R634 Abnormal weight loss: Secondary | ICD-10-CM

## 2018-11-17 DIAGNOSIS — K219 Gastro-esophageal reflux disease without esophagitis: Secondary | ICD-10-CM

## 2018-11-17 DIAGNOSIS — I10 Essential (primary) hypertension: Secondary | ICD-10-CM

## 2018-11-17 DIAGNOSIS — D6959 Other secondary thrombocytopenia: Secondary | ICD-10-CM

## 2018-11-17 DIAGNOSIS — E039 Hypothyroidism, unspecified: Secondary | ICD-10-CM

## 2018-11-17 DIAGNOSIS — R600 Localized edema: Secondary | ICD-10-CM

## 2018-11-17 DIAGNOSIS — G620 Drug-induced polyneuropathy: Secondary | ICD-10-CM

## 2018-11-17 DIAGNOSIS — R5383 Other fatigue: Secondary | ICD-10-CM

## 2018-11-17 DIAGNOSIS — E876 Hypokalemia: Secondary | ICD-10-CM

## 2018-11-17 DIAGNOSIS — Z9049 Acquired absence of other specified parts of digestive tract: Secondary | ICD-10-CM

## 2018-11-17 DIAGNOSIS — D696 Thrombocytopenia, unspecified: Secondary | ICD-10-CM

## 2018-11-17 DIAGNOSIS — T451X5S Adverse effect of antineoplastic and immunosuppressive drugs, sequela: Secondary | ICD-10-CM

## 2018-11-17 DIAGNOSIS — R7989 Other specified abnormal findings of blood chemistry: Secondary | ICD-10-CM

## 2018-11-17 DIAGNOSIS — R630 Anorexia: Secondary | ICD-10-CM

## 2018-11-17 DIAGNOSIS — R197 Diarrhea, unspecified: Secondary | ICD-10-CM

## 2018-11-17 DIAGNOSIS — Z79899 Other long term (current) drug therapy: Secondary | ICD-10-CM

## 2018-11-17 LAB — CBC WITH DIFFERENTIAL/PLATELET
Abs Immature Granulocytes: 0.07 10*3/uL (ref 0.00–0.07)
Basophils Absolute: 0.1 10*3/uL (ref 0.0–0.1)
Basophils Relative: 1 %
Eosinophils Absolute: 0 10*3/uL (ref 0.0–0.5)
Eosinophils Relative: 1 %
HCT: 25.5 % — ABNORMAL LOW (ref 36.0–46.0)
Hemoglobin: 8.1 g/dL — ABNORMAL LOW (ref 12.0–15.0)
Immature Granulocytes: 1 %
Lymphocytes Relative: 20 %
Lymphs Abs: 1.6 10*3/uL (ref 0.7–4.0)
MCH: 29.8 pg (ref 26.0–34.0)
MCHC: 31.8 g/dL (ref 30.0–36.0)
MCV: 93.8 fL (ref 80.0–100.0)
Monocytes Absolute: 0.6 10*3/uL (ref 0.1–1.0)
Monocytes Relative: 8 %
Neutro Abs: 5.3 10*3/uL (ref 1.7–7.7)
Neutrophils Relative %: 69 %
Platelets: 177 10*3/uL (ref 150–400)
RBC: 2.72 MIL/uL — ABNORMAL LOW (ref 3.87–5.11)
RDW: 19.8 % — ABNORMAL HIGH (ref 11.5–15.5)
WBC: 7.7 10*3/uL (ref 4.0–10.5)
nRBC: 0 % (ref 0.0–0.2)

## 2018-11-17 LAB — COMPREHENSIVE METABOLIC PANEL
ALT: 19 U/L (ref 0–44)
AST: 42 U/L — ABNORMAL HIGH (ref 15–41)
Albumin: 2.9 g/dL — ABNORMAL LOW (ref 3.5–5.0)
Alkaline Phosphatase: 91 U/L (ref 38–126)
Anion gap: 10 (ref 5–15)
BUN: 7 mg/dL — ABNORMAL LOW (ref 8–23)
CO2: 24 mmol/L (ref 22–32)
Calcium: 9.2 mg/dL (ref 8.9–10.3)
Chloride: 101 mmol/L (ref 98–111)
Creatinine, Ser: 0.52 mg/dL (ref 0.44–1.00)
GFR calc Af Amer: >60 mL/min (ref >60)
GFR calc non Af Amer: >60 mL/min (ref >60)
Glucose, Bld: 102 mg/dL — ABNORMAL HIGH (ref 70–99)
Potassium: 3.2 mmol/L — ABNORMAL LOW (ref 3.5–5.1)
Sodium: 135 mmol/L (ref 135–145)
Total Bilirubin: 0.7 mg/dL (ref 0.3–1.2)
Total Protein: 6.4 g/dL — ABNORMAL LOW (ref 6.5–8.1)

## 2018-11-17 LAB — SAMPLE TO BLOOD BANK

## 2018-11-17 LAB — MAGNESIUM: Magnesium: 1.9 mg/dL (ref 1.7–2.4)

## 2018-11-17 MED ORDER — MAGNESIUM SULFATE 2 GM/50ML IV SOLN: 2.0000 g | Freq: Once | INTRAVENOUS | Status: DC

## 2018-11-17 MED ORDER — SODIUM CHLORIDE 0.9% FLUSH
10.0000 mL | Freq: Once | INTRAVENOUS | Status: AC
  Administered 2018-11-17: 10 mL via INTRAVENOUS
  Filled 2018-11-17: qty 10

## 2018-11-17 MED ORDER — HEPARIN SOD (PORK) LOCK FLUSH 100 UNIT/ML IV SOLN
500.0000 [IU] | Freq: Once | INTRAVENOUS | Status: AC | PRN
  Administered 2018-11-17: 500 [IU]
  Filled 2018-11-17: qty 5

## 2018-11-17 MED ORDER — SODIUM CHLORIDE 0.9 % IV SOLN
INTRAVENOUS | Status: DC
  Administered 2018-11-17: 10:00:00 via INTRAVENOUS
  Filled 2018-11-17: qty 250

## 2018-11-17 MED ORDER — POTASSIUM CHLORIDE 20 MEQ/100ML IV SOLN
20.0000 meq | Freq: Once | INTRAVENOUS | Status: AC
  Administered 2018-11-17: 20 meq via INTRAVENOUS

## 2018-11-17 MED ORDER — PANCRELIPASE (LIP-PROT-AMYL) 36000-114000 UNITS PO CPEP: 36000.0000 [IU] | ORAL_CAPSULE | Freq: Three times a day (TID) | ORAL | 0 refills | Status: DC

## 2018-11-17 MED ORDER — SODIUM CHLORIDE 0.9 % IV SOLN: 2.0000 g | Freq: Once | INTRAVENOUS | Status: DC

## 2018-11-17 NOTE — Progress Notes (Signed)
ON PATHWAY REGIMEN - Pancreatic  No Change  Continue With Treatment as Ordered.     A cycle is every 14 days:     Oxaliplatin      Leucovorin      Irinotecan      5-Fluorouracil   **Always confirm dose/schedule in your pharmacy ordering system**  Patient Characteristics: Adenocarcinoma, Resectable, Adjuvant, Negative Margins (Includes Positive Lymph Nodes) Histology: Adenocarcinoma Current evidence of distant metastases<= No AJCC T Category: T2 AJCC N Category: N1 AJCC M Category: M0 AJCC 8 Stage Grouping: IIB Treatment Setting: Adjuvant Intent of Therapy: Curative Intent, Discussed with Patient

## 2018-11-17 NOTE — Progress Notes (Signed)
DISCONTINUE ON PATHWAY REGIMEN - Pancreatic     A cycle is every 14 days:     Oxaliplatin      Leucovorin      Irinotecan      5-Fluorouracil   **Always confirm dose/schedule in your pharmacy ordering system**  REASON: Toxicities / Adverse Event PRIOR TREATMENT: [id number]: mFOLFIRINOX q14 Days to a Total of 6 Months of Chemotherapy (Combined Neoadjuvant and Adjuvant) TREATMENT RESPONSE: Unable to Evaluate  START ON PATHWAY REGIMEN - Pancreatic Adenocarcinoma     A cycle is every 28 days (3 weeks on and 1 week off):     Gemcitabine   **Always confirm dose/schedule in your pharmacy ordering system**  Patient Characteristics: No Distant Metastases, Resectable, Adjuvant, Negative Margins (Includes Positive Lymph Nodes) Current evidence of distant metastases<= No AJCC T Category: T2 AJCC N Category: N1 AJCC M Category: M0 AJCC 8 Stage Grouping: IIB Treatment Setting: Adjuvant Intent of Therapy: Curative Intent, Discussed with Patient

## 2018-11-17 NOTE — Progress Notes (Signed)
Patient here for follow up. Pt states she still has sores to her bottom. Pt's weight today was 128.1# on 2 different scales; reweighed x3. She states she has been eating the same, but approx 12 pound weight loss since last visit. She states she has started working with PT and she has been walking more.

## 2018-11-17 NOTE — Progress Notes (Signed)
Hematology/Oncology Follow Up Note [facility]  [phone] [phone]  Patient Care Team: Jodelle Green, FNP as PCP - General (Family Medicine) Clent Jacks, RN as Registered Nurse   Name of the patient: Lynn Taylor  [id number]  Jun 16, 1945   REASON FOR VISIT  follow-up for adjuvant chemotherapy for pancreatic cancer.  HISTORY OF PRESENTING ILLNESS:  Lynn Taylor is a  73 y.o.  female with PMH listed below who was referred to me for evaluation of evaluation of elevated ferritin. Patient recently had lab work-up done on 04/09/2018 which showed ferritin level 1877, iron 97, TIBC 340, iron saturation 29, 04/11/2018 CBC showed hemoglobin 13.2, MCV 81, WBC 7.6, platelet counts 246,000. Patient was referred to hematology for further evaluation of elevated ferritin.  #04/28/2018 Ultrasound of liver was obtained which showed CBD and intrahepatic biliary dilatation Patient wa she is s advised to go to emergency room for further evaluation. Also had hyperbilirubinemia.  Patient was complaining about being jaundiced, pruritus all over. MR abdomen MRCP with and without contrast showed market biliary duct dilatation, secondary to obstruction in the region of pancreatic head.  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 No abdominal adenopathy, liver metastasis or cross vascular involvement.  patient was evaluated by gastroenterology and status post ERCP with stenting. CA 19.9 1173 CEA 3.9 # 05/25/2018  patient was referred to [facility] and s/p whipple resection.Her postop course was c/b Type A pancreatic leak and c.diff. Pathology showed A. Hepatic artery lymph node, excision: One lymph node, negative for malignancy (0/1). B. Biliary stent, removal: Medical device, consistent with stent, gross examination only. C. Head of pancreas, duodenum, portion of stomach, pancreaticoduodenectomy (Whipple):  Pancreatic ductal adenocarcinoma, poorly differentiated, 3.2 cm, uncinate, confined to pancreas. All margins are negative (closest margin=uncinate, 0.5 mm) Metastatic adenocarcinoma in one of thirty-two lymph nodes (1/32).  Portion of stomach and duodenum with no specific pathologic diagnosis.   Grade, 3 poorly differentiated.  Pathologic pT2 pN1  Cancer Treatment 07/13/2018 Cycle 1 FOLFIRINOX with Onpro Neulasta.  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 10/06/2018 cycle 6  mFOLFIRINOX with Onpro Neulasta - [50% dose for Irinotecan- 5-FU bolus omitted]. 10/20/2018 cycle 7 mFOLFIRINOX with Onpro Neulasta - [50% dose for Irinotecan- 5-FU bolus omitted].  INTERVAL HISTORY Lynn Taylor is a 73 y.o. female with history of stage IIb pancreatic cancer presents for evaluation prior to adjuvant chemotherapy [place] Patient reports feeling better. Does not taste food so still not eating very much. Lost 10 pounds since last week. Chemotherapy has been held for 2 weeks. Intermittent diarrhea, improved.  She is reports diarrhea is not daily, 2-3 times per week.  She uses Imodium as instructed. Lower extremity swelling has improved slightly.   Review of Systems  Constitutional: Positive for appetite change, fatigue and unexpected weight change. Negative for chills and fever.  HENT:   Negative for hearing loss and voice change.   Eyes: Negative for eye problems.  Respiratory: Negative for chest tightness, cough  and shortness of breath.   Cardiovascular: Negative  for chest pain.  Gastrointestinal: Positive for diarrhea. Negative for abdominal distention, abdominal pain and blood in stool.  Endocrine: Negative for hot flashes.  Genitourinary: Negative for difficulty urinating and frequency.   Musculoskeletal: Negative for arthralgias.  Skin: Negative for itching and rash.  Neurological: Negative for extremity weakness.  Hematological: Negative for adenopathy.  Psychiatric/Behavioral: Negative for confusion.      No Known Allergies   Past Medical History:  Diagnosis Date  . Cancer ([place])   . GERD (gastroesophageal reflux disease)   . Hypertension   . Hypothyroidism   . Malignant neoplasm of head of pancreas ([place]) 06/08/2018     Past Surgical History:  Procedure Laterality Date  . CHOLECYSTECTOMY    . COLONOSCOPY N/A 12/02/2016   Procedure: COLONOSCOPY;  Surgeon: Lollie Sails, MD;  Location: [facility] ENDOSCOPY;  Service: Endoscopy;  Laterality: N/A;  . ERCP N/A 04/30/2018   Procedure: ENDOSCOPIC RETROGRADE CHOLANGIOPANCREATOGRAPHY (ERCP);  Surgeon: Lucilla Lame, MD;  Location: [facility] ENDOSCOPY;  Service: Endoscopy;  Laterality: N/A;  . ERCP N/A 05/05/2018   Procedure: ENDOSCOPIC RETROGRADE CHOLANGIOPANCREATOGRAPHY (ERCP);  Surgeon: Lucilla Lame, MD;  Location: [facility] ENDOSCOPY;  Service: Endoscopy;  Laterality: N/A;  . PORTA CATH INSERTION N/A 06/28/2018   Procedure: PORTA CATH INSERTION;  Surgeon: Algernon Huxley, MD;  Location: [place] CV LAB;  Service: Cardiovascular;  Laterality: N/A;    Social History   Socioeconomic History  . Marital status: Married    Spouse name: Not on file  . Number of children: Not on file  . Years of education: Not on file  . Highest education level: Not on file  Occupational History  . Not on file  Social Needs  . Financial resource strain: Not on file  . Food insecurity:    Worry: Not on file    Inability: Not on file  . Transportation needs:     Medical: Not on file    Non-medical: Not on file  Tobacco Use  . Smoking status: Former Smoker    Last attempt to quit: 04/14/1988    Years since quitting: 30.6  . Smokeless tobacco: Never Used  Substance and Sexual Activity  . Alcohol use: Yes    Comment: social drinker  . Drug use: No  . Sexual activity: Not Currently  Lifestyle  . Physical activity:    Days per week: Not on file    Minutes per session: Not on file  . Stress: Not on file  Relationships  . Social connections:    Talks on phone: Not on file    Gets together: Not on file    Attends religious service: Not on file    Active member of club or organization: Not on file    Attends meetings of clubs or organizations: Not on file    Relationship status: Not on file  . Intimate partner violence:    Fear of current or ex partner: Not on file    Emotionally abused: Not on file    Physically abused: Not on file    Forced sexual activity: Not on file  Other Topics Concern  . Not on file  Social History Narrative  . Not on file    Family History  Problem Relation Age of Onset  . Diabetes Mellitus I Other   . Alcoholism Other   . Hypertension Other   . Hyperlipidemia Other   . Coronary artery disease Other   . Stroke Other   . Osteoarthritis Other   . Migraines Other   .  Heart Problems Mother   . Stroke Sister   . CAD Brother   . Stroke Brother   . Breast cancer Neg Hx      Current Outpatient Medications:  .  chlorhexidine (PERIDEX) 0.12 % solution, Use as directed 15 mLs in the mouth or throat 2 (two) times daily., Disp: 473 mL, Rfl: 3 .  Cholecalciferol (VITAMIN D-3) 25 MCG (1000 UT) CAPS, Take by mouth., Disp: , Rfl:  .  dexamethasone (DECADRON) 4 MG tablet, Take 2 tablets (8 mg total) by mouth See admin instructions. Start the day after chemo for 2 days., Disp: 30 tablet, Rfl: 0 .  gabapentin (NEURONTIN) 300 MG capsule, Take 1 capsule (300 mg total) by mouth 3 (three) times daily., Disp: 90 capsule,  Rfl: 0 .  levothyroxine (SYNTHROID, LEVOTHROID) 100 MCG tablet, Take 1 tablet (100 mcg total) by mouth daily before breakfast., Disp: 90 tablet, Rfl: 4 .  lidocaine-prilocaine (EMLA) cream, Apply 1 application topically as needed., Disp: 30 g, Rfl: 3 .  loperamide (IMODIUM) 2 MG capsule, Take 1 capsule (2 mg total) by mouth See admin instructions. With onset of loose stool, take 4mg  followed by 2mg  every 2 hours until 12 hours have passed without loose bowel movement. Maximum: 16 mg/day, Disp: 120 capsule, Rfl: 1 .  loratadine (CLARITIN) 10 MG tablet, Take 10 mg by mouth daily., Disp: , Rfl:  .  Magnesium Cl-Calcium Carbonate (SLOW-MAG PO), Take 2 tablets by mouth daily., Disp: , Rfl:  .  nystatin (MYCOSTATIN) 100000 UNIT/ML suspension, Take 5 mLs (500,000 Units total) by mouth 4 (four) times daily., Disp: 473 mL, Rfl: 1 .  omeprazole (PRILOSEC) 20 MG capsule, Take 1 capsule (20 mg total) by mouth daily., Disp: 90 capsule, Rfl: 0 .  ondansetron (ZOFRAN) 4 MG tablet, Take 1 tablet (4 mg total) by mouth every 6 (six) hours as needed for nausea or vomiting., Disp: 90 tablet, Rfl: 1 .  potassium chloride SA (K-DUR) 20 MEQ tablet, Take 2 tablets (40 mEq total) by mouth daily., Disp: 60 tablet, Rfl: 0 .  prochlorperazine (COMPAZINE) 10 MG tablet, Take 1 tablet (10 mg total) by mouth every 6 (six) hours as needed (NAUSEA)., Disp: 90 tablet, Rfl: 0 No current facility-administered medications for this visit.   Facility-Administered Medications Ordered in Other Visits:  .  0.9 %  sodium chloride infusion, , Intravenous, Continuous, Earlie Server, MD, Stopped at 11/17/18 1239 .  magnesium sulfate IVPB 2 g 50 mL, 2 g, Intravenous, Once, Earlie Server, MD  Physical exam: ECOG 2 Vitals:   11/17/18 0925  BP: 128/82  Pulse: (!) 108  Resp: 18  Temp: (!) 97 F (36.1 C)  TempSrc: Tympanic  SpO2: 98%  Weight: 128 lb 1.6 oz (58.1 kg)   Physical Exam Constitutional:      General: She is not in acute distress.     Comments: Cachectic  HENT:     Head: Normocephalic and atraumatic.  Eyes:     General: No scleral icterus.    Pupils: Pupils are equal, round, and reactive to light.  Neck:     Musculoskeletal: Normal range of motion and neck supple.  Cardiovascular:     Rate and Rhythm: Normal rate and regular rhythm.     Heart sounds: Normal heart sounds.  Pulmonary:     Effort: Pulmonary effort is normal. No respiratory distress.     Breath sounds: No wheezing.  Abdominal:     General: Bowel sounds are normal. There is no distension.  Palpations: Abdomen is soft. There is no mass.     Tenderness: There is no abdominal tenderness.  Musculoskeletal: Normal range of motion.        General: No deformity.  Skin:    General: Skin is warm and dry.     Findings: No erythema or rash.  Neurological:     Mental Status: She is alert and oriented to person, place, and time.     Cranial Nerves: No cranial nerve deficit.     Coordination: Coordination normal.  Psychiatric:        Behavior: Behavior normal.        Thought Content: Thought content normal.     CMP Latest Ref Rng & Units 11/17/2018  Glucose 70 - 99 mg/dL 102(H)  BUN 8 - 23 mg/dL 7(L)  Creatinine 0.44 - 1.00 mg/dL 0.52  Sodium 135 - 145 mmol/L 135  Potassium 3.5 - 5.1 mmol/L 3.2(L)  Chloride 98 - 111 mmol/L 101  CO2 22 - 32 mmol/L 24  Calcium 8.9 - 10.3 mg/dL 9.2  Total Protein 6.5 - 8.1 g/dL 6.4(L)  Total Bilirubin 0.3 - 1.2 mg/dL 0.7  Alkaline Phos 38 - 126 U/L 91  AST 15 - 41 U/L 42(H)  ALT 0 - 44 U/L 19   CBC Latest Ref Rng & Units 11/17/2018  WBC 4.0 - 10.5 K/uL 7.7  Hemoglobin 12.0 - 15.0 g/dL 8.1(L)  Hematocrit 36.0 - 46.0 % 25.5(L)  Platelets 150 - 400 K/uL 177   RADIOGRAPHIC STUDIES: I have personally reviewed the radiological images as listed and agreed with the findings in the report.  Ct Head Wo Contrast  Result Date: 10/28/2018 CLINICAL DATA:  Altered mental status, dizziness and blurred vision since yesterday.  History of pancreatic cancer. EXAM: CT HEAD WITHOUT CONTRAST TECHNIQUE: Contiguous axial images were obtained from the base of the skull through the vertex without intravenous contrast. COMPARISON:  February 03, 2011 FINDINGS: Brain: No evidence of acute infarction, hemorrhage, hydrocephalus, extra-axial collection or mass lesion/mass effect. There is chronic diffuse atrophy. Vascular: No hyperdense vessel is noted. Skull: Normal. Negative for fracture or focal lesion. Sinuses/Orbits: No acute finding. Other: None. IMPRESSION: No focal acute intracranial abnormality identified. Chronic diffuse atrophy. Electronically Signed   By: Abelardo Diesel M.D.   On: 10/28/2018 09:44     Assessment and plan Patient is a 73 y.o. female history of stage IIb pancreatic cancer present for evaluation prior to adjuvant chemotherapy.   1. Malignant neoplasm of head of pancreas ([place])   2. Weight loss   3. Anemia due to antineoplastic chemotherapy   4. Hypokalemia   5. Hypomagnesemia    #Stage IIb pancreatic cancer status post surgical resection. Patient received 7 cycles of mFOLFIRINOX with moderate difficulties. She has lost significant amount of weight. Hold chemotherapy.  #Thrombocytopenia, recovered. #Anemia secondary to chemotherapy, stable.  Continue to monitor. #Hypokalemia, likely secondary to diarrhea.  Continue oral potassium supplementation 40 mEq daily. I will proceed with 20 mEq potassium chloride IV #Hypomagnesia, magnesium level is 1.8 today.  Continue oral magnesium supplementation. #Weight loss/cachexia, hold chemotherapy.  Discussed with patient that since she did not tolerate chemotherapy well continue to hold and give her a break.  Patient will be reevaluated in 2 weeks. Consider switch to Gemcitabine single agent.  I would obtain repeat CT abdomen pelvis w contrast for re-evaluation.   Continue follow-up with dietitian.  #Intermittent diarrhea, chemotherapy induced versus pancreatitis  insufficiency. Diarrhea gets better when off chemotherapy.  Recommend trial of  Creon.  # Neuropathy, continue gabapentin Orders Placed This Encounter  Procedures  . CT Abdomen Pelvis W Contrast    Standing Status:   Future    Standing Expiration Date:   11/17/2019    Order Specific Question:   If indicated for the ordered procedure, I authorize the administration of contrast media per Radiology protocol    Answer:   Yes    Order Specific Question:   Preferred imaging location?    Answer:   [facility]    Order Specific Question:   Is Oral Contrast requested for this exam?    Answer:   Yes, Per Radiology protocol    Order Specific Question:   Radiology Contrast Protocol - do NOT remove file path    Answer:   [email or web address]  . Cancer antigen 19-9    Standing Status:   Future    Standing Expiration Date:   11/17/2019   Repeat labs in 2 weeks, MD assessment for evaluation of Gemcitabine.    Earlie Server, MD, PhD  11/17/2018

## 2018-11-18 ENCOUNTER — Telehealth: Payer: Self-pay

## 2018-11-18 NOTE — Telephone Encounter (Signed)
Called and notified Ms. Tappan that Dr. Tasia Catchings would like a CT of her abdomen/pelvis. CT scheduled for 11/23/18 at 1600, with arrival time of 1545, at the [facility]. She will pick up contrast and was instructed not to eat or drink anything 4 hours prior. Educated on Creon and notified that this was sent to her [employer].

## 2018-11-19 ENCOUNTER — Inpatient Hospital Stay: Payer: Medicare Other

## 2018-11-23 ENCOUNTER — Ambulatory Visit
Admission: RE | Admit: 2018-11-23 | Discharge: 2018-11-23 | Disposition: A | Discharge status: Home / Self Care | Payer: Medicare Other | Source: Ambulatory Visit | Attending: Oncology | Admitting: Oncology

## 2018-11-23 ENCOUNTER — Other Ambulatory Visit: Payer: Self-pay

## 2018-11-23 DIAGNOSIS — C250 Malignant neoplasm of head of pancreas: Secondary | ICD-10-CM | POA: Insufficient documentation

## 2018-11-23 DIAGNOSIS — R634 Abnormal weight loss: Secondary | ICD-10-CM | POA: Diagnosis present

## 2018-11-23 MED ORDER — IOHEXOL 300 MG/ML  SOLN
100.0000 mL | Freq: Once | INTRAMUSCULAR | Status: AC | PRN
  Administered 2018-11-23: 100 mL via INTRAVENOUS

## 2018-11-24 ENCOUNTER — Other Ambulatory Visit: Payer: Self-pay

## 2018-11-24 ENCOUNTER — Encounter: Payer: Self-pay | Admitting: Oncology

## 2018-11-24 ENCOUNTER — Ambulatory Visit
Admission: RE | Admit: 2018-11-24 | Discharge: 2018-11-24 | Disposition: A | Discharge status: Home / Self Care | Payer: Medicare Other | Source: Ambulatory Visit | Attending: Oncology | Admitting: Oncology

## 2018-11-24 ENCOUNTER — Inpatient Hospital Stay: Payer: Medicare Other

## 2018-11-24 ENCOUNTER — Other Ambulatory Visit: Payer: Self-pay | Admitting: Oncology

## 2018-11-24 ENCOUNTER — Inpatient Hospital Stay (HOSPITAL_BASED_OUTPATIENT_CLINIC_OR_DEPARTMENT_OTHER): Payer: Medicare Other | Admitting: Oncology

## 2018-11-24 VITALS — BP 114/74 | HR 121 | Temp 96.7°F | Resp 18 | Wt 126.5 lb

## 2018-11-24 DIAGNOSIS — E876 Hypokalemia: Secondary | ICD-10-CM

## 2018-11-24 DIAGNOSIS — D6481 Anemia due to antineoplastic chemotherapy: Secondary | ICD-10-CM

## 2018-11-24 DIAGNOSIS — C250 Malignant neoplasm of head of pancreas: Secondary | ICD-10-CM | POA: Diagnosis not present

## 2018-11-24 DIAGNOSIS — E440 Moderate protein-calorie malnutrition: Secondary | ICD-10-CM

## 2018-11-24 DIAGNOSIS — I824Y3 Acute embolism and thrombosis of unspecified deep veins of proximal lower extremity, bilateral: Secondary | ICD-10-CM

## 2018-11-24 DIAGNOSIS — E871 Hypo-osmolality and hyponatremia: Secondary | ICD-10-CM

## 2018-11-24 DIAGNOSIS — T451X5A Adverse effect of antineoplastic and immunosuppressive drugs, initial encounter: Secondary | ICD-10-CM

## 2018-11-24 DIAGNOSIS — D696 Thrombocytopenia, unspecified: Secondary | ICD-10-CM

## 2018-11-24 LAB — COMPREHENSIVE METABOLIC PANEL
ALT: 23 U/L (ref 0–44)
AST: 54 U/L — ABNORMAL HIGH (ref 15–41)
Albumin: 3.5 g/dL (ref 3.5–5.0)
Alkaline Phosphatase: 104 U/L (ref 38–126)
Anion gap: 12 (ref 5–15)
BUN: 7 mg/dL — ABNORMAL LOW (ref 8–23)
CO2: 23 mmol/L (ref 22–32)
Calcium: 9.3 mg/dL (ref 8.9–10.3)
Chloride: 99 mmol/L (ref 98–111)
Creatinine, Ser: 0.39 mg/dL — ABNORMAL LOW (ref 0.44–1.00)
GFR calc Af Amer: >60 mL/min (ref >60)
GFR calc non Af Amer: >60 mL/min (ref >60)
Glucose, Bld: 87 mg/dL (ref 70–99)
Potassium: 3.1 mmol/L — ABNORMAL LOW (ref 3.5–5.1)
Sodium: 134 mmol/L — ABNORMAL LOW (ref 135–145)
Total Bilirubin: 1 mg/dL (ref 0.3–1.2)
Total Protein: 7.4 g/dL (ref 6.5–8.1)

## 2018-11-24 LAB — CBC WITH DIFFERENTIAL/PLATELET
Abs Immature Granulocytes: 0.05 10*3/uL (ref 0.00–0.07)
Basophils Absolute: 0 10*3/uL (ref 0.0–0.1)
Basophils Relative: 0 %
Eosinophils Absolute: 0.1 10*3/uL (ref 0.0–0.5)
Eosinophils Relative: 1 %
HCT: 28.5 % — ABNORMAL LOW (ref 36.0–46.0)
Hemoglobin: 9.1 g/dL — ABNORMAL LOW (ref 12.0–15.0)
Immature Granulocytes: 1 %
Lymphocytes Relative: 21 %
Lymphs Abs: 2.1 10*3/uL (ref 0.7–4.0)
MCH: 29.9 pg (ref 26.0–34.0)
MCHC: 31.9 g/dL (ref 30.0–36.0)
MCV: 93.8 fL (ref 80.0–100.0)
Monocytes Absolute: 0.8 10*3/uL (ref 0.1–1.0)
Monocytes Relative: 8 %
Neutro Abs: 6.8 10*3/uL (ref 1.7–7.7)
Neutrophils Relative %: 69 %
Platelets: 159 10*3/uL (ref 150–400)
RBC: 3.04 MIL/uL — ABNORMAL LOW (ref 3.87–5.11)
RDW: 18.7 % — ABNORMAL HIGH (ref 11.5–15.5)
WBC: 9.8 10*3/uL (ref 4.0–10.5)
nRBC: 0 % (ref 0.0–0.2)

## 2018-11-24 LAB — MAGNESIUM: Magnesium: 1.8 mg/dL (ref 1.7–2.4)

## 2018-11-24 MED ORDER — SODIUM CHLORIDE 0.9% FLUSH
10.0000 mL | INTRAVENOUS | Status: DC | PRN
  Administered 2018-11-24: 10 mL via INTRAVENOUS
  Filled 2018-11-24: qty 10

## 2018-11-24 MED ORDER — HEPARIN SOD (PORK) LOCK FLUSH 100 UNIT/ML IV SOLN
INTRAVENOUS | Status: AC
  Filled 2018-11-24: qty 5

## 2018-11-24 MED ORDER — ELIQUIS 5 MG VTE STARTER PACK: ORAL_TABLET | ORAL | 0 refills | Status: DC

## 2018-11-24 MED ORDER — HEPARIN SOD (PORK) LOCK FLUSH 100 UNIT/ML IV SOLN
500.0000 [IU] | Freq: Once | INTRAVENOUS | Status: AC
  Administered 2018-11-24: 500 [IU] via INTRAVENOUS

## 2018-11-25 ENCOUNTER — Telehealth: Payer: Self-pay | Admitting: *Deleted

## 2018-11-25 ENCOUNTER — Inpatient Hospital Stay: Payer: Medicare Other

## 2018-11-25 ENCOUNTER — Encounter: Payer: Self-pay | Admitting: Oncology

## 2018-11-25 DIAGNOSIS — T451X5A Adverse effect of antineoplastic and immunosuppressive drugs, initial encounter: Secondary | ICD-10-CM

## 2018-11-25 DIAGNOSIS — I82403 Acute embolism and thrombosis of unspecified deep veins of lower extremity, bilateral: Secondary | ICD-10-CM | POA: Insufficient documentation

## 2018-11-25 DIAGNOSIS — D6481 Anemia due to antineoplastic chemotherapy: Secondary | ICD-10-CM

## 2018-11-25 DIAGNOSIS — E876 Hypokalemia: Secondary | ICD-10-CM | POA: Insufficient documentation

## 2018-11-25 HISTORY — DX: Adverse effect of antineoplastic and immunosuppressive drugs, initial encounter: D64.81

## 2018-11-25 HISTORY — DX: Anemia due to antineoplastic chemotherapy: T45.1X5A

## 2018-11-25 HISTORY — DX: Hypomagnesemia: E83.42

## 2018-11-25 LAB — SAMPLE TO BLOOD BANK

## 2018-11-25 NOTE — Telephone Encounter (Signed)
I called patient to see how she is doing today after a call to answering service last night. She reports that she is feeling much better today and that she had not eaten yesterday and took her medicine on an empty stomach and then ate a banana which did not settle well. She reports that her feet don't hurt as much today either and she does not feel that she needs a visit today. She will call back if she feels the need to be seen.

## 2018-11-25 NOTE — Progress Notes (Signed)
Nutrition Follow-up:  RD working remotely.  Patient with pancreatic cancer, s/p whipple. Patient currently off of chemotherapy.    Spoke with patient via phone this pm.  Patient reports that her appetite is a little bit better.  Ate some oatmeal this am.  Yesterday ate oatmeal as well but then did not eat for a long time due to having to get [place] and found to have blood clots.  Reports got home and took eliquis on empty stomach then tried to eat a banana sandwich and felt sick on stomach.  Reports that she is feeling better today and is trying to eat something before taking her medication.  Reports other foods that are tasting good to her is tomatoes and okra.    Patient is not drinking oral nutrition supplements currently due to when she tried them while on chemo it increased diarrhea.    Noted pressure ulcer.  Patient reports that this has healed.  Medications: creon added  Labs: reviewed  Anthropometrics:   Weight decreased to 126 lb from 135 lb on 5/13  6% weight loss in the last month, significant   NUTRITION DIAGNOSIS: Inadequate oral intake continues    INTERVENTION:  Reviewed how to take creon. Encouraged good sources of protein.  Currently patient diet is very low in protein foods.  Discussed options of how to add protein back into diet.  Patient verbalized understanding. Patient wanting to try oral nutrition supplements again while currently off chemotherapy.   Recommend MVI daily. Patient has contact information    MONITORING, EVALUATION, GOAL: Patient will consume adequate calories and protein to prevent further weight loss   NEXT VISIT: phone f/u July 9   Gevork Ayyad B. Zenia Resides, [place], [place] Registered Dietitian [phone] (pager)

## 2018-11-25 NOTE — Telephone Encounter (Signed)
Returned call to [facility] to give confirmation of DVT to bilateral legs and to hold PT for 2 weeks, per Dr. Tasia Catchings.

## 2018-11-25 NOTE — Progress Notes (Signed)
Hematology/Oncology Follow Up Note [facility]  [phone] [phone]  Patient Care Team: Jodelle Green, FNP as PCP - General (Family Medicine) Clent Jacks, RN as Registered Nurse   Name of the patient: Lynn Taylor  [id number]  Sep 06, 1945   REASON FOR VISIT  follow-up for adjuvant chemotherapy for pancreatic cancer.  HISTORY OF PRESENTING ILLNESS:  Lynn Taylor is a  73 y.o.  female with PMH listed below who was referred to me for evaluation of evaluation of elevated ferritin. Patient recently had lab work-up done on 04/09/2018 which showed ferritin level 1877, iron 97, TIBC 340, iron saturation 29, 04/11/2018 CBC showed hemoglobin 13.2, MCV 81, WBC 7.6, platelet counts 246,000. Patient was referred to hematology for further evaluation of elevated ferritin.  #04/28/2018 Ultrasound of liver was obtained which showed CBD and intrahepatic biliary dilatation Patient wa she is s advised to go to emergency room for further evaluation. Also had hyperbilirubinemia.  Patient was complaining about being jaundiced, pruritus all over. MR abdomen MRCP with and without contrast showed market biliary duct dilatation, secondary to obstruction in the region of pancreatic head.  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 No abdominal adenopathy, liver metastasis or cross vascular involvement.  patient was evaluated by gastroenterology and status post ERCP with stenting. CA 19.9 1173 CEA 3.9 # 05/25/2018  patient was referred to [facility] and s/p whipple resection.Her postop course was c/b Type A pancreatic leak and c.diff. Pathology showed A. Hepatic artery lymph node, excision: One lymph node, negative for malignancy (0/1). B. Biliary stent, removal: Medical device, consistent with stent, gross examination only. C. 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 All margins are negative (closest margin=uncinate, 0.5 mm) Metastatic adenocarcinoma in one of thirty-two lymph nodes (1/32).  Portion of stomach and duodenum with no specific pathologic diagnosis.   Grade, 3 poorly differentiated.  Pathologic pT2 pN1  Cancer Treatment 07/13/2018 Cycle 1 FOLFIRINOX with Onpro Neulasta.  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 10/06/2018 cycle 6  mFOLFIRINOX with Onpro Neulasta - [50% dose for Irinotecan- 5-FU bolus omitted]. 10/20/2018 cycle 7 mFOLFIRINOX with Onpro Neulasta - [50% dose for Irinotecan- 5-FU bolus omitted].  INTERVAL HISTORY Lynn Taylor is a 73 y.o. female with history of stage IIb pancreatic cancer presents to discuss management of newly diagnosed bilateral lower extremity DVT. From adjuvant chemotherapy. She did not tolerate FOLFIRINOX.  Plan to reevaluate her next week and start on gemcitabine. Interval CT was done for surveillance. CT showed stable appearance of the abdomen status post Whipple procedure.  No specific findings identified to suggest recurrent tumor or metastatic disease.  There is bilateral low-attenuation filling defects within the common femoral veins concerning for DVT. Ultrasound venous duplex bilateral lower extremity was done stat and confirmed bilateral lower extremity DVT. Reports feeling better the discontinuation of  chemotherapy. Appetite is better.  She has gained some weight. Continue to have bilateral lower extremity edema Denies any shortness of  breath, chest pain, abdominal pain.  Denies any calf tenderness. Diarrhea has improved after she adjusted her diet.  Review of Systems  Constitutional: Positive for appetite change, fatigue and unexpected weight change. Negative for chills and fever.  HENT:   Negative for hearing loss and voice change.   Eyes: Negative for eye problems.  Respiratory: Negative for chest tightness, cough and shortness of breath.   Cardiovascular: Positive for leg swelling. Negative for chest pain.  Gastrointestinal: Positive for diarrhea. Negative for abdominal distention, abdominal pain and blood in stool.  Endocrine: Negative for hot flashes.  Genitourinary: Negative for difficulty urinating and frequency.   Musculoskeletal: Negative for arthralgias.  Skin: Negative for itching and rash.  Neurological: Negative for extremity weakness.  Hematological: Negative for adenopathy.  Psychiatric/Behavioral: Negative for confusion.      No Known Allergies   Past Medical History:  Diagnosis Date   Cancer ([place])    GERD (gastroesophageal reflux disease)    Hypertension    Hypothyroidism    Malignant neoplasm of head of pancreas ([place]) 06/08/2018     Past Surgical History:  Procedure Laterality Date   CHOLECYSTECTOMY     COLONOSCOPY N/A 12/02/2016   Procedure: COLONOSCOPY;  Surgeon: Lollie Sails, MD;  Location[facility] ENDOSCOPY;  Service: Endoscopy;  Laterality: N/A;   ERCP N/A 04/30/2018   Procedure: ENDOSCOPIC RETROGRADE CHOLANGIOPANCREATOGRAPHY (ERCP);  Surgeon: Lucilla Lame, MD;  Location: [facility] ENDOSCOPY;  Service: Endoscopy;  Laterality: N/A;   ERCP N/A 05/05/2018   Procedure: ENDOSCOPIC RETROGRADE CHOLANGIOPANCREATOGRAPHY (ERCP);  Surgeon: Lucilla Lame, MD;  Location: [facility] ENDOSCOPY;  Service: Endoscopy;  Laterality: N/A;   PORTA CATH INSERTION N/A  06/28/2018   Procedure: PORTA CATH INSERTION;  Surgeon: Algernon Huxley, MD;  Location: [place] [facility] LAB;  Service: Cardiovascular;  Laterality: N/A;    Social History   Socioeconomic History   Marital status: Married    Spouse name: Not on file   Number of children: Not on file   Years of education: Not on file   Highest education level: Not on file  Occupational History   Not on file  Social Needs   Financial resource strain: Not on file   Food insecurity    Worry: Not on file    Inability: Not on file   Transportation needs    Medical: Not on file    Non-medical: Not on file  Tobacco Use   Smoking status: Former Smoker    Quit date: 04/14/1988    Years since quitting: 30.6   Smokeless tobacco: Never Used  Substance and Sexual Activity   Alcohol use: Yes    Comment: social drinker   Drug use: No   Sexual activity: Not Currently  Lifestyle   Physical activity    Days per week: Not on file    Minutes per session: Not on file   Stress: Not on file  Relationships   Social connections    Talks on phone: Not on file    Gets together: Not on file    Attends religious service: Not on file    Active member of club or organization: Not on file    Attends meetings of clubs or organizations: Not on file    Relationship status: Not on file   Intimate partner violence    Fear of current or ex partner: Not on file    Emotionally abused: Not on file    Physically abused: Not on file    Forced sexual activity: Not on  file  Other Topics Concern   Not on file  Social History Narrative   Not on file    Family History  Problem Relation Age of Onset   Diabetes Mellitus I Other    Alcoholism Other    Hypertension Other    Hyperlipidemia Other    Coronary artery disease Other    Stroke Other    Osteoarthritis Other    Migraines Other    Heart Problems Mother    Stroke Sister    CAD Brother    Stroke Brother    Breast cancer Neg Hx       Current Outpatient Medications:    chlorhexidine (PERIDEX) 0.12 % solution, Use as directed 15 mLs in the mouth or throat 2 (two) times daily., Disp: 473 mL, Rfl: 3   Cholecalciferol (VITAMIN D-3) 25 MCG (1000 UT) CAPS, Take by mouth., Disp: , Rfl:    dexamethasone (DECADRON) 4 MG tablet, Take 2 tablets (8 mg total) by mouth See admin instructions. Start the day after chemo for 2 days., Disp: 30 tablet, Rfl: 0   Eliquis DVT/PE Starter Pack (ELIQUIS STARTER PACK) 5 MG TABS, Take as directed on package: start with two-'5mg'$  tablets twice daily for 7 days. On day 8, switch to one-'5mg'$  tablet twice daily., Disp: 1 each, Rfl: 0   gabapentin (NEURONTIN) 300 MG capsule, Take 1 capsule (300 mg total) by mouth 3 (three) times daily., Disp: 90 capsule, Rfl: 0   levothyroxine (SYNTHROID, LEVOTHROID) 100 MCG tablet, Take 1 tablet (100 mcg total) by mouth daily before breakfast., Disp: 90 tablet, Rfl: 4   lidocaine-prilocaine (EMLA) cream, Apply 1 application topically as needed., Disp: 30 g, Rfl: 3   loperamide (IMODIUM) 2 MG capsule, Take 1 capsule (2 mg total) by mouth See admin instructions. With onset of loose stool, take '4mg'$  followed by '2mg'$  every 2 hours until 12 hours have passed without loose bowel movement. Maximum: 16 mg/day, Disp: 120 capsule, Rfl: 1   loratadine (CLARITIN) 10 MG tablet, Take 10 mg by mouth daily., Disp: , Rfl:    Magnesium Cl-Calcium Carbonate (SLOW-MAG PO), Take 2 tablets by mouth daily., Disp: , Rfl:    nystatin (MYCOSTATIN) 100000 UNIT/ML suspension, Take 5 mLs (500,000 Units total) by mouth 4 (four) times daily., Disp: 473 mL, Rfl: 1   omeprazole (PRILOSEC) 20 MG capsule, Take 1 capsule (20 mg total) by mouth daily., Disp: 90 capsule, Rfl: 0   ondansetron (ZOFRAN) 4 MG tablet, Take 1 tablet (4 mg total) by mouth every 6 (six) hours as needed for nausea or vomiting., Disp: 90 tablet, Rfl: 1   potassium chloride SA (K-DUR) 20 MEQ tablet, Take 2 tablets (40 mEq  total) by mouth daily., Disp: 60 tablet, Rfl: 0   lipase/protease/amylase (CREON) 36000 UNITS CPEP capsule, Take 1 capsule (36,000 Units total) by mouth 3 (three) times daily with meals. (Patient not taking: Reported on 11/24/2018), Disp: 180 capsule, Rfl: 0  Physical exam: ECOG 2 Vitals:   11/24/18 1652  BP: 114/74  Pulse: (!) 121  Resp: 18  Temp: (!) 96.7 F (35.9 C)  SpO2: 98%  Weight: 126 lb 8 oz (57.4 kg)   Physical Exam Constitutional:      General: She is not in acute distress.    Comments: Cachectic  HENT:     Head: Normocephalic and atraumatic.  Eyes:     General: No scleral icterus.    Pupils: Pupils are equal, round, and reactive to light.  Neck:  Musculoskeletal: Normal range of motion and neck supple.  Cardiovascular:     Rate and Rhythm: Normal rate and regular rhythm.     Heart sounds: Normal heart sounds.  Pulmonary:     Effort: Pulmonary effort is normal. No respiratory distress.     Breath sounds: No wheezing.  Abdominal:     General: Bowel sounds are normal. There is no distension.     Palpations: Abdomen is soft. There is no mass.     Tenderness: There is no abdominal tenderness.  Musculoskeletal: Normal range of motion.        General: Swelling present. No deformity.  Skin:    General: Skin is warm and dry.     Findings: No erythema or rash.  Neurological:     Mental Status: She is alert and oriented to person, place, and time.     Cranial Nerves: No cranial nerve deficit.     Coordination: Coordination normal.  Psychiatric:        Behavior: Behavior normal.        Thought Content: Thought content normal.     CMP Latest Ref Rng & Units 11/24/2018  Glucose 70 - 99 mg/dL 87  BUN 8 - 23 mg/dL 7(L)  Creatinine 0.44 - 1.00 mg/dL 0.39(L)  Sodium 135 - 145 mmol/L 134(L)  Potassium 3.5 - 5.1 mmol/L 3.1(L)  Chloride 98 - 111 mmol/L 99  CO2 22 - 32 mmol/L 23  Calcium 8.9 - 10.3 mg/dL 9.3  Total Protein 6.5 - 8.1 g/dL 7.4  Total Bilirubin 0.3 -  1.2 mg/dL 1.0  Alkaline Phos 38 - 126 U/L 104  AST 15 - 41 U/L 54(H)  ALT 0 - 44 U/L 23   CBC Latest Ref Rng & Units 11/24/2018  WBC 4.0 - 10.5 K/uL 9.8  Hemoglobin 12.0 - 15.0 g/dL 9.1(L)  Hematocrit 36.0 - 46.0 % 28.5(L)  Platelets 150 - 400 K/uL 159   RADIOGRAPHIC STUDIES: I have personally reviewed the radiological images as listed and agreed with the findings in the report.  Ct Head Wo Contrast  Result Date: 10/28/2018 CLINICAL DATA:  Altered mental status, dizziness and blurred vision since yesterday. History of pancreatic cancer. EXAM: CT HEAD WITHOUT CONTRAST TECHNIQUE: Contiguous axial images were obtained from the base of the skull through the vertex without intravenous contrast. COMPARISON:  February 03, 2011 FINDINGS: Brain: No evidence of acute infarction, hemorrhage, hydrocephalus, extra-axial collection or mass lesion/mass effect. There is chronic diffuse atrophy. Vascular: No hyperdense vessel is noted. Skull: Normal. Negative for fracture or focal lesion. Sinuses/Orbits: No acute finding. Other: None. IMPRESSION: No focal acute intracranial abnormality identified. Chronic diffuse atrophy. Electronically Signed   By: Abelardo Diesel M.D.   On: 10/28/2018 09:44   Ct Abdomen Pelvis W Contrast  Result Date: 11/24/2018 CLINICAL DATA:  Malignant neoplasm of the pancreas. EXAM: CT ABDOMEN AND PELVIS WITH CONTRAST TECHNIQUE: Multidetector CT imaging of the abdomen and pelvis was performed using the standard protocol following bolus administration of intravenous contrast. CONTRAST:  169m OMNIPAQUE IOHEXOL 300 MG/ML  SOLN COMPARISON:  07/02/2018 FINDINGS: Lower chest: No acute abnormality. Hepatobiliary: Small low-density structure in left lobe of liver is unchanged measuring 5 mm, image 13/2. No biliary ductal dilatation. Pancreas: Postoperative change from Whipple procedure identified. No complicating features identified. No main duct dilatation or inflammation. No mass identified. Spleen:  Normal in size without focal abnormality. Adrenals/Urinary Tract: Adrenal glands are unremarkable. Kidneys are normal, without renal calculi, focal lesion, or hydronephrosis. Bladder is unremarkable.  Stomach/Bowel: Postoperative anatomy of the stomach and proximal small bowel compatible with with procedure. No dilated loops of small or large bowel identified. The appendix is visualized and appears normal. Vascular/Lymphatic: Aortic atherosclerosis without aneurysm. The portal vein appears patent. No abdominal or pelvic adenopathy identified. Bilateral common femoral veins are distended and contain low-attenuation filling defects compatible with thrombus. Reproductive: Uterus and bilateral adnexa are unremarkable. Other: No free fluid or fluid collections. No peritoneal nodule or mass identified. Musculoskeletal: Spondylosis within the lumbar spine. IMPRESSION: 1. Stable appearance of the abdomen status post Whipple procedure. No specific findings identified to suggest recurrent tumor or metastatic disease. 2. Bilateral low-attenuation filling defects within the common femoral veins concerning for DVT. Further evaluation with bilateral lower extremity Dopplers recommended. 3. Critical Value/emergent results were called by telephone at the time of interpretation on 11/24/2018 at 9:22 am to Dr. Earlie Server , who verbally acknowledged these results. 4.  Aortic Atherosclerosis (ICD10-I70.0). 5. Electronically Signed   By: Kerby Moors M.D.   On: 11/24/2018 09:23   US Venous Img Lower Bilateral  Result Date: 11/24/2018 CLINICAL DATA:  Bilateral lower extremity pain and edema. Concern for DVT on preceding CT scan of the abdomen pelvis. History of malignancy. Former smoker. Evaluate for DVT. EXAM: BILATERAL LOWER EXTREMITY VENOUS DOPPLER ULTRASOUND TECHNIQUE: Gray-scale sonography with graded compression, as well as color Doppler and duplex ultrasound were performed to evaluate the lower extremity deep venous systems from  the level of the common femoral vein and including the common femoral, femoral, profunda femoral, popliteal and calf veins including the posterior tibial, peroneal and gastrocnemius veins when visible. The superficial great saphenous vein was also interrogated. Spectral Doppler was utilized to evaluate flow at rest and with distal augmentation maneuvers in the common femoral, femoral and popliteal veins. COMPARISON:  CT abdomen and pelvis - 11/23/2018 FINDINGS: RIGHT LOWER EXTREMITY Common Femoral Vein: There is mixed echogenic near occlusive DVT involving the right common femoral vein (image 8), as was suggested on preceding abdominal CT. Saphenofemoral Junction: No evidence of thrombus. Normal compressibility and flow on color Doppler imaging. There is hypoechoic nonocclusive thrombus involving the imaged portions of the right deep femoral vein (image 15), the proximal (image 16), mid (image 19) and distal (image 22) aspects of the right femoral vein. There is hypoechoic near occlusive thrombus involving the proximal aspect of the right popliteal vein (image 27) extending to involve both paired posterior tibial veins (images 3132). Peroneal veins appear patent where imaged. Note is made of a trace right-sided Baker cyst. _________________________________________________________ LEFT LOWER EXTREMITY Common Femoral Vein: No evidence of thrombus. Normal compressibility, respiratory phasicity and response to augmentation. Saphenofemoral Junction: No evidence of thrombus. Normal compressibility and flow on color Doppler imaging. Profunda Femoral Vein: There is mixed echogenic occlusive thrombus seen within the imaged portions of the right deep femoral vein (image 46). Femoral Vein: Proximal aspect of the right femoral vein is patent however there is hypoechoic occlusive thrombus involving the mid (image 53) and distal (image 56) aspects of left femoral vein. Popliteal Vein: No evidence of thrombus. Normal  compressibility, respiratory phasicity and response to augmentation. Calf Veins: There is hypoechoic occlusive thrombus involving both paired left posterior tibial veins (image 64). The left peroneal veins appear patent where imaged. Superficial Great Saphenous Vein: No evidence of thrombus. Normal compressibility. IMPRESSION: 1. Examination is positive for mixed occlusive and nonocclusive DVT extending from the right common femoral vein to the paired right posterior tibial veins. 2. Examination is positive for  tandem areas of occlusive DVT involving the left deep femoral, the mid and distal aspects of the left femoral and both paired left posterior tibial veins as detailed above. Electronically Signed   By: Sandi Mariscal M.D.   On: 11/24/2018 15:00     Assessment and plan Patient is a 73 y.o. female history of stage IIb pancreatic cancer present for discussion of image results and management of newly diagnosed bilateral lower extremity DVT. 1. Malignant neoplasm of head of pancreas ([place])   2. Moderate protein-calorie malnutrition ([place])   3. Acute deep vein thrombosis (DVT) of proximal vein of both lower extremities ([facility])   4. Anemia due to antineoplastic chemotherapy    #Stage IIb pancreatic cancer status post surgical resection. Patient received 7 cycles of mFOLFIRINOX with moderate difficulties. Currently chemotherapy is on hold to allow her to recover. CT abdomen pelvis images were independently viewed by me and discussed with patient.  No recurrence/disease progression. Plan reevaluate patient in 1 week for assessment prior to gemcitabine monotherapy.  #Acute bilateral lower extremity DVT, Denies any breathing symptoms.  Hemodynamically stable except tachycardia. Discussed about admitting patient to hospital heparin drip versus Eliquis outpatient and see how she does Patient to start.  Outpatient treatment. Prescription for Eliquis starter kit was sent to [facility]. Also gave patient  Eliquis coupon. I emphasized that patient needs to start taking Eliquis immediately.  If there is any problem for patient to get Eliquis today, I recommend patient to go to emergency room and get admitted. She voices understanding.  #Anemia secondary to chemotherapy, hemoglobin improved to 9.1.  Continue to monitor. #Hypokalemia, likely secondary to diarrhea.  Continue oral potassium supplementation 40 mEq daily. Recommend patient to increase potassium rich food, i.e. bananas.  #Hypomagnesia, magnesium level is 1.8 today.  Continue oral magnesium supplementation. Continue follow-up with dietitian.  #Intermittent diarrhea, chemotherapy induced versus pancreatitis insufficiency. Diarrhea gets better when off chemotherapy.  She has not tried Creon.  Encourage patient to start.  # Neuropathy, continue gabapentin  Repeat labs in1 week  MD assessment for evaluation of Gemcitabine.  We spent sufficient time to discuss many aspect of care, questions were answered to patient's satisfaction. Total face to face encounter time for this patient visit was 25 min. >50% of the time was  spent in counseling and coordination of care.    Earlie Server, MD, PhD  11/25/2018

## 2018-11-25 NOTE — Telephone Encounter (Signed)
Tommy with [facility] called reporting that the family told him patient has blood clots in her legs and he is asking for confirmation of this and if there is any plan of action for PT in regards to this. Please return his call [phone]

## 2018-11-29 ENCOUNTER — Telehealth: Payer: Self-pay | Admitting: *Deleted

## 2018-11-29 ENCOUNTER — Telehealth: Payer: Self-pay | Admitting: Family Medicine

## 2018-11-29 NOTE — Telephone Encounter (Signed)
Izora Gala, OT with [facility], calling as FYI to report patient has had two missed visits. Izora Gala can be reached directly with any questions.

## 2018-11-29 NOTE — Telephone Encounter (Signed)
Lynn Taylor from [facility] called to clarify orders. She states patient made them aware that she was newly diagnosed with blood clots. Lynn Taylor needs a return call to discuss any new recommendations regarding PT/OT. CB# [phone].

## 2018-11-29 NOTE — Telephone Encounter (Signed)
Mila Merry, OT with [facility], calling as FYI to report patient has had two missed visits. Izora Gala can be reached directly with any questions

## 2018-11-30 NOTE — Telephone Encounter (Signed)
Spoke to [place], [place], and she states that she works with pt's upper extremities and transfers. Per Dr. Tasia Catchings, ok if upper extremity and transfers. Nancy notified.

## 2018-12-01 ENCOUNTER — Other Ambulatory Visit: Payer: Self-pay

## 2018-12-01 ENCOUNTER — Inpatient Hospital Stay: Payer: Medicare Other

## 2018-12-01 ENCOUNTER — Inpatient Hospital Stay (HOSPITAL_BASED_OUTPATIENT_CLINIC_OR_DEPARTMENT_OTHER): Payer: Medicare Other | Admitting: Oncology

## 2018-12-01 ENCOUNTER — Encounter: Payer: Self-pay | Admitting: Oncology

## 2018-12-01 VITALS — BP 115/77 | HR 109 | Temp 98.2°F | Wt 129.4 lb

## 2018-12-01 DIAGNOSIS — E440 Moderate protein-calorie malnutrition: Secondary | ICD-10-CM

## 2018-12-01 DIAGNOSIS — R5383 Other fatigue: Secondary | ICD-10-CM

## 2018-12-01 DIAGNOSIS — Z9049 Acquired absence of other specified parts of digestive tract: Secondary | ICD-10-CM | POA: Diagnosis not present

## 2018-12-01 DIAGNOSIS — D6481 Anemia due to antineoplastic chemotherapy: Secondary | ICD-10-CM | POA: Diagnosis not present

## 2018-12-01 DIAGNOSIS — I10 Essential (primary) hypertension: Secondary | ICD-10-CM

## 2018-12-01 DIAGNOSIS — E039 Hypothyroidism, unspecified: Secondary | ICD-10-CM

## 2018-12-01 DIAGNOSIS — Z79899 Other long term (current) drug therapy: Secondary | ICD-10-CM

## 2018-12-01 DIAGNOSIS — T451X5A Adverse effect of antineoplastic and immunosuppressive drugs, initial encounter: Secondary | ICD-10-CM

## 2018-12-01 DIAGNOSIS — E876 Hypokalemia: Secondary | ICD-10-CM

## 2018-12-01 DIAGNOSIS — Z95828 Presence of other vascular implants and grafts: Secondary | ICD-10-CM

## 2018-12-01 DIAGNOSIS — C250 Malignant neoplasm of head of pancreas: Secondary | ICD-10-CM

## 2018-12-01 DIAGNOSIS — I824Y3 Acute embolism and thrombosis of unspecified deep veins of proximal lower extremity, bilateral: Secondary | ICD-10-CM

## 2018-12-01 DIAGNOSIS — Z87891 Personal history of nicotine dependence: Secondary | ICD-10-CM

## 2018-12-01 DIAGNOSIS — K219 Gastro-esophageal reflux disease without esophagitis: Secondary | ICD-10-CM

## 2018-12-01 DIAGNOSIS — G629 Polyneuropathy, unspecified: Secondary | ICD-10-CM

## 2018-12-01 DIAGNOSIS — T451X5S Adverse effect of antineoplastic and immunosuppressive drugs, sequela: Secondary | ICD-10-CM

## 2018-12-01 DIAGNOSIS — G620 Drug-induced polyneuropathy: Secondary | ICD-10-CM

## 2018-12-01 DIAGNOSIS — E871 Hypo-osmolality and hyponatremia: Secondary | ICD-10-CM

## 2018-12-01 DIAGNOSIS — D696 Thrombocytopenia, unspecified: Secondary | ICD-10-CM

## 2018-12-01 LAB — COMPREHENSIVE METABOLIC PANEL
ALT: 15 U/L (ref 0–44)
AST: 38 U/L (ref 15–41)
Albumin: 2.9 g/dL — ABNORMAL LOW (ref 3.5–5.0)
Alkaline Phosphatase: 97 U/L (ref 38–126)
Anion gap: 6 (ref 5–15)
BUN: 10 mg/dL (ref 8–23)
CO2: 25 mmol/L (ref 22–32)
Calcium: 9.6 mg/dL (ref 8.9–10.3)
Chloride: 105 mmol/L (ref 98–111)
Creatinine, Ser: 0.48 mg/dL (ref 0.44–1.00)
GFR calc Af Amer: >60 mL/min (ref >60)
GFR calc non Af Amer: >60 mL/min (ref >60)
Glucose, Bld: 103 mg/dL — ABNORMAL HIGH (ref 70–99)
Potassium: 3.9 mmol/L (ref 3.5–5.1)
Sodium: 136 mmol/L (ref 135–145)
Total Bilirubin: 0.5 mg/dL (ref 0.3–1.2)
Total Protein: 6.3 g/dL — ABNORMAL LOW (ref 6.5–8.1)

## 2018-12-01 LAB — CBC WITH DIFFERENTIAL/PLATELET
Abs Immature Granulocytes: 0.02 10*3/uL (ref 0.00–0.07)
Basophils Absolute: 0 10*3/uL (ref 0.0–0.1)
Basophils Relative: 1 %
Eosinophils Absolute: 0.2 10*3/uL (ref 0.0–0.5)
Eosinophils Relative: 3 %
HCT: 25.6 % — ABNORMAL LOW (ref 36.0–46.0)
Hemoglobin: 8.1 g/dL — ABNORMAL LOW (ref 12.0–15.0)
Immature Granulocytes: 0 %
Lymphocytes Relative: 25 %
Lymphs Abs: 1.4 10*3/uL (ref 0.7–4.0)
MCH: 29.9 pg (ref 26.0–34.0)
MCHC: 31.6 g/dL (ref 30.0–36.0)
MCV: 94.5 fL (ref 80.0–100.0)
Monocytes Absolute: 0.4 10*3/uL (ref 0.1–1.0)
Monocytes Relative: 8 %
Neutro Abs: 3.5 10*3/uL (ref 1.7–7.7)
Neutrophils Relative %: 63 %
Platelets: 158 10*3/uL (ref 150–400)
RBC: 2.71 MIL/uL — ABNORMAL LOW (ref 3.87–5.11)
RDW: 18.6 % — ABNORMAL HIGH (ref 11.5–15.5)
WBC: 5.5 10*3/uL (ref 4.0–10.5)
nRBC: 0 % (ref 0.0–0.2)

## 2018-12-01 LAB — SAMPLE TO BLOOD BANK

## 2018-12-01 MED ORDER — HEPARIN SOD (PORK) LOCK FLUSH 100 UNIT/ML IV SOLN
500.0000 [IU] | Freq: Once | INTRAVENOUS | Status: AC
  Administered 2018-12-01: 500 [IU] via INTRAVENOUS

## 2018-12-01 MED ORDER — SODIUM CHLORIDE 0.9% FLUSH
10.0000 mL | Freq: Once | INTRAVENOUS | Status: AC
  Administered 2018-12-01: 10 mL via INTRAVENOUS
  Filled 2018-12-01: qty 10

## 2018-12-01 MED ORDER — HEPARIN SOD (PORK) LOCK FLUSH 100 UNIT/ML IV SOLN
INTRAVENOUS | Status: AC
  Filled 2018-12-01: qty 5

## 2018-12-02 LAB — CANCER ANTIGEN 19-9: CA 19-9: 29 U/mL (ref 0–35)

## 2018-12-02 NOTE — Telephone Encounter (Signed)
Mila Merry, OT with [facility], calling as FYI to report patient has had two missed visits. Izora Gala can be reached directly with any questions

## 2018-12-03 ENCOUNTER — Telehealth: Payer: Self-pay | Admitting: *Deleted

## 2018-12-03 NOTE — Telephone Encounter (Signed)
Noted, thanks!

## 2018-12-03 NOTE — Telephone Encounter (Signed)
Copied from [place] [phone]. Topic: General - Inquiry >> Dec 03, 2018  8:49 AM [place] A wrote: Izora Gala from [facility] reporting one missed visit on 11/30/2018 due to schedule conflict.

## 2018-12-05 NOTE — Addendum Note (Signed)
Addended by: Earlie Server on: 12/05/2018 01:10 AM   Modules accepted: Orders

## 2018-12-05 NOTE — Progress Notes (Signed)
Hematology/Oncology Follow Up Note [facility]  [phone] [phone]  Patient Care Team: Jodelle Green, FNP as PCP - General (Family Medicine) Clent Jacks, RN as Registered Nurse   Name of the patient: Lynn Taylor  [id number]  1945/07/27   REASON FOR VISIT  follow-up for adjuvant chemotherapy for pancreatic cancer.  HISTORY OF PRESENTING ILLNESS:  Lynn Taylor is a  73 y.o.  female with PMH listed below who was referred to me for evaluation of evaluation of elevated ferritin. Patient recently had lab work-up done on 04/09/2018 which showed ferritin level 1877, iron 97, TIBC 340, iron saturation 29, 04/11/2018 CBC showed hemoglobin 13.2, MCV 81, WBC 7.6, platelet counts 246,000. Patient was referred to hematology for further evaluation of elevated ferritin.  #04/28/2018 Ultrasound of liver was obtained which showed CBD and intrahepatic biliary dilatation Patient wa she is s advised to go to emergency room for further evaluation. Also had hyperbilirubinemia.  Patient was complaining about being jaundiced, pruritus all over. MR abdomen MRCP with and without contrast showed market biliary duct dilatation, secondary to obstruction in the region of pancreatic head.  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 No abdominal adenopathy, liver metastasis or cross vascular involvement.  patient was evaluated by gastroenterology and status post ERCP with stenting. CA 19.9 1173 CEA 3.9 # 05/25/2018  patient was referred to [facility] and s/p whipple resection.Her postop course was c/b Type A pancreatic leak and c.diff. Pathology showed A. Hepatic artery lymph node, excision: One lymph node, negative for malignancy (0/1). B. Biliary stent, removal: Medical device, consistent with stent, gross examination only. C. 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 All margins are negative (closest margin=uncinate, 0.5 mm) Metastatic adenocarcinoma in one of thirty-two lymph nodes (1/32).  Portion of stomach and duodenum with no specific pathologic diagnosis.   Grade, 3 poorly differentiated.  Pathologic pT2 pN1  Cancer Treatment 07/13/2018 Cycle 1 FOLFIRINOX with Onpro Neulasta.  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 10/06/2018 cycle 6  mFOLFIRINOX with Onpro Neulasta - [50% dose for Irinotecan- 5-FU bolus omitted]. 10/20/2018 cycle 7 mFOLFIRINOX with Onpro Neulasta - [50% dose for Irinotecan- 5-FU bolus omitted].  INTERVAL HISTORY Lynn Taylor is a 73 y.o. female with history of stage IIb pancreatic cancer presents to for evaluation prior to chemotherapy and also management of DVT.   She take Eliquis and tolerates well. No bleeding events.  Continues to have bilateral lower extremity swelling.   Chronic weakness, fatigue, slightly better.  Appetite is still poor, does not eat much.  She has gained 3 pounds since 2 weeks ago.  Reports " food does not taste well".  Diarrhea episodes are less frequently.   Denies SOB or chest pain.   Review of Systems  Constitutional: Positive for appetite change, fatigue and unexpected weight change. Negative for chills and fever.  HENT:   Negative for hearing loss and voice change.   Eyes: Negative for eye problems.  Respiratory: Negative for chest tightness, cough and shortness of breath.   Cardiovascular: Positive for leg swelling. Negative for chest pain.  Gastrointestinal: Negative for abdominal distention, abdominal pain, blood in stool and diarrhea.  Endocrine: Negative for hot flashes.  Genitourinary: Negative for difficulty urinating and frequency.   Musculoskeletal: Negative for arthralgias.  Skin: Negative for itching and rash.  Neurological: Negative for extremity weakness.  Hematological: Negative for adenopathy.  Psychiatric/Behavioral: Negative for confusion.      No Known Allergies   Past Medical History:  Diagnosis Date   Anemia due to antineoplastic chemotherapy 11/25/2018   Cancer [facility])    GERD (gastroesophageal reflux disease)    Hypertension    Hypomagnesemia 11/25/2018   Hypothyroidism    Malignant neoplasm of head of pancreas ([place]) 06/08/2018     Past Surgical History:  Procedure Laterality Date   CHOLECYSTECTOMY     COLONOSCOPY N/A 12/02/2016   Procedure: COLONOSCOPY;  Surgeon: Lollie Sails, MD;  Location: [facility] ENDOSCOPY;  Service: Endoscopy;  Laterality: N/A;   ERCP N/A 04/30/2018   Procedure: ENDOSCOPIC RETROGRADE CHOLANGIOPANCREATOGRAPHY (ERCP);  Surgeon: Lucilla Lame, MD;  Location: [facility] ENDOSCOPY;  Service: Endoscopy;  Laterality: N/A;   ERCP N/A 05/05/2018   Procedure: ENDOSCOPIC RETROGRADE CHOLANGIOPANCREATOGRAPHY (ERCP);  Surgeon: Lucilla Lame, MD;  Location: [facility] ENDOSCOPY;  Service: Endoscopy;  Laterality: N/A;   PORTA CATH INSERTION N/A 06/28/2018   Procedure: PORTA CATH INSERTION;  Surgeon: Algernon Huxley, MD;  Location: [facility];  Service: Cardiovascular;  Laterality: N/A;    Social History   Socioeconomic History   Marital status: Married    Spouse name: Not on file   Number of children: Not on file   Years of education: Not on file   Highest education level: Not on file  Occupational History   Not on file   Social Needs   Financial resource strain: Not on file   Food insecurity    Worry: Not on file    Inability: Not on file   Transportation needs    Medical: Not on file    Non-medical: Not on file  Tobacco Use   Smoking status: Former Smoker    Quit date: 04/14/1988    Years since quitting: 30.6   Smokeless tobacco: Never Used  Substance and Sexual Activity   Alcohol use: Yes    Comment: social drinker   Drug use: No   Sexual activity: Not Currently  Lifestyle   Physical activity    Days per week: Not on file    Minutes per session: Not on file   Stress: Not on file  Relationships   Social connections    Talks on phone: Not on file    Gets together: Not on file    Attends religious service: Not on file    Active member of club or organization: Not on file    Attends meetings of clubs or organizations: Not on file    Relationship status: Not on file   Intimate partner violence    Fear of current or ex partner: Not on file    Emotionally abused: Not on file    Physically abused: Not on file    Forced sexual activity: Not on file  Other Topics Concern   Not on file  Social History Narrative   Not on file    Family History  Problem Relation Age of Onset   Diabetes Mellitus I Other    Alcoholism Other    Hypertension Other  Hyperlipidemia Other    Coronary artery disease Other    Stroke Other    Osteoarthritis Other    Migraines Other    Heart Problems Mother    Stroke Sister    CAD Brother    Stroke Brother    Breast cancer Neg Hx      Current Outpatient Medications:    chlorhexidine (PERIDEX) 0.12 % solution, Use as directed 15 mLs in the mouth or throat 2 (two) times daily., Disp: 473 mL, Rfl: 3   Cholecalciferol (VITAMIN D-3) 25 MCG (1000 UT) CAPS, Take by mouth., Disp: , Rfl:    dexamethasone (DECADRON) 4 MG tablet, Take 2 tablets (8 mg total) by mouth See admin instructions. Start the day after chemo for 2 days., Disp: 30  tablet, Rfl: 0   Eliquis DVT/PE Starter Pack (ELIQUIS STARTER PACK) 5 MG TABS, Take as directed on package: start with two-5mg  tablets twice daily for 7 days. On day 8, switch to one-5mg  tablet twice daily., Disp: 1 each, Rfl: 0   gabapentin (NEURONTIN) 300 MG capsule, Take 1 capsule (300 mg total) by mouth 3 (three) times daily., Disp: 90 capsule, Rfl: 0   levothyroxine (SYNTHROID, LEVOTHROID) 100 MCG tablet, Take 1 tablet (100 mcg total) by mouth daily before breakfast., Disp: 90 tablet, Rfl: 4   lidocaine-prilocaine (EMLA) cream, Apply 1 application topically as needed., Disp: 30 g, Rfl: 3   lipase/protease/amylase (CREON) 36000 UNITS CPEP capsule, Take 1 capsule (36,000 Units total) by mouth 3 (three) times daily with meals., Disp: 180 capsule, Rfl: 0   loperamide (IMODIUM) 2 MG capsule, Take 1 capsule (2 mg total) by mouth See admin instructions. With onset of loose stool, take 4mg  followed by 2mg  every 2 hours until 12 hours have passed without loose bowel movement. Maximum: 16 mg/day, Disp: 120 capsule, Rfl: 1   loratadine (CLARITIN) 10 MG tablet, Take 10 mg by mouth daily., Disp: , Rfl:    Magnesium Cl-Calcium Carbonate (SLOW-MAG PO), Take 2 tablets by mouth daily., Disp: , Rfl:    nystatin (MYCOSTATIN) 100000 UNIT/ML suspension, Take 5 mLs (500,000 Units total) by mouth 4 (four) times daily., Disp: 473 mL, Rfl: 1   omeprazole (PRILOSEC) 20 MG capsule, Take 1 capsule (20 mg total) by mouth daily., Disp: 90 capsule, Rfl: 0   ondansetron (ZOFRAN) 4 MG tablet, Take 1 tablet (4 mg total) by mouth every 6 (six) hours as needed for nausea or vomiting., Disp: 90 tablet, Rfl: 1   potassium chloride SA (K-DUR) 20 MEQ tablet, Take 2 tablets (40 mEq total) by mouth daily., Disp: 60 tablet, Rfl: 0  Physical exam: ECOG 2 Vitals:   12/01/18 0937  BP: 115/77  Pulse: (!) 109  Temp: 98.2 F (36.8 C)  TempSrc: Tympanic  Weight: 129 lb 6 oz (58.7 kg)   Physical Exam Constitutional:       General: She is not in acute distress.    Appearance: She is ill-appearing.     Comments: Cachectic  HENT:     Head: Normocephalic and atraumatic.  Eyes:     General: No scleral icterus.    Pupils: Pupils are equal, round, and reactive to light.  Neck:     Musculoskeletal: Normal range of motion and neck supple.  Cardiovascular:     Rate and Rhythm: Regular rhythm. Tachycardia present.     Heart sounds: Normal heart sounds.  Pulmonary:     Effort: Pulmonary effort is normal. No respiratory distress.     Breath sounds:  No wheezing.  Abdominal:     General: Bowel sounds are normal. There is no distension.     Palpations: Abdomen is soft. There is no mass.     Tenderness: There is no abdominal tenderness.  Musculoskeletal: Normal range of motion.        General: Swelling present. No deformity.  Skin:    General: Skin is warm and dry.     Findings: No erythema or rash.  Neurological:     Mental Status: She is alert and oriented to person, place, and time.     Cranial Nerves: No cranial nerve deficit.     Coordination: Coordination normal.  Psychiatric:        Behavior: Behavior normal.        Thought Content: Thought content normal.     CMP Latest Ref Rng & Units 12/01/2018  Glucose 70 - 99 mg/dL 103(H)  BUN 8 - 23 mg/dL 10  Creatinine 0.44 - 1.00 mg/dL 0.48  Sodium 135 - 145 mmol/L 136  Potassium 3.5 - 5.1 mmol/L 3.9  Chloride 98 - 111 mmol/L 105  CO2 22 - 32 mmol/L 25  Calcium 8.9 - 10.3 mg/dL 9.6  Total Protein 6.5 - 8.1 g/dL 6.3(L)  Total Bilirubin 0.3 - 1.2 mg/dL 0.5  Alkaline Phos 38 - 126 U/L 97  AST 15 - 41 U/L 38  ALT 0 - 44 U/L 15   CBC Latest Ref Rng & Units 12/01/2018  WBC 4.0 - 10.5 K/uL 5.5  Hemoglobin 12.0 - 15.0 g/dL 8.1(L)  Hematocrit 36.0 - 46.0 % 25.6(L)  Platelets 150 - 400 K/uL 158   RADIOGRAPHIC STUDIES: I have personally reviewed the radiological images as listed and agreed with the findings in the report.  Ct Abdomen Pelvis W  Contrast  Result Date: 11/24/2018 CLINICAL DATA:  Malignant neoplasm of the pancreas. EXAM: CT ABDOMEN AND PELVIS WITH CONTRAST TECHNIQUE: Multidetector CT imaging of the abdomen and pelvis was performed using the standard protocol following bolus administration of intravenous contrast. CONTRAST:  121mL OMNIPAQUE IOHEXOL 300 MG/ML  SOLN COMPARISON:  07/02/2018 FINDINGS: Lower chest: No acute abnormality. Hepatobiliary: Small low-density structure in left lobe of liver is unchanged measuring 5 mm, image 13/2. No biliary ductal dilatation. Pancreas: Postoperative change from Whipple procedure identified. No complicating features identified. No main duct dilatation or inflammation. No mass identified. Spleen: Normal in size without focal abnormality. Adrenals/Urinary Tract: Adrenal glands are unremarkable. Kidneys are normal, without renal calculi, focal lesion, or hydronephrosis. Bladder is unremarkable. Stomach/Bowel: Postoperative anatomy of the stomach and proximal small bowel compatible with with procedure. No dilated loops of small or large bowel identified. The appendix is visualized and appears normal. Vascular/Lymphatic: Aortic atherosclerosis without aneurysm. The portal vein appears patent. No abdominal or pelvic adenopathy identified. Bilateral common femoral veins are distended and contain low-attenuation filling defects compatible with thrombus. Reproductive: Uterus and bilateral adnexa are unremarkable. Other: No free fluid or fluid collections. No peritoneal nodule or mass identified. Musculoskeletal: Spondylosis within the lumbar spine. IMPRESSION: 1. Stable appearance of the abdomen status post Whipple procedure. No specific findings identified to suggest recurrent tumor or metastatic disease. 2. Bilateral low-attenuation filling defects within the common femoral veins concerning for DVT. Further evaluation with bilateral lower extremity Dopplers recommended. 3. Critical Value/emergent results were  called by telephone at the time of interpretation on 11/24/2018 at 9:22 am to Dr. Earlie Server , who verbally acknowledged these results. 4.  Aortic Atherosclerosis (ICD10-I70.0). 5. Electronically Signed   By: Lovena Le  Clovis Riley M.D.   On: 11/24/2018 09:23   US Venous Img Lower Bilateral  Result Date: 11/24/2018 CLINICAL DATA:  Bilateral lower extremity pain and edema. Concern for DVT on preceding CT scan of the abdomen pelvis. History of malignancy. Former smoker. Evaluate for DVT. EXAM: BILATERAL LOWER EXTREMITY VENOUS DOPPLER ULTRASOUND TECHNIQUE: Gray-scale sonography with graded compression, as well as color Doppler and duplex ultrasound were performed to evaluate the lower extremity deep venous systems from the level of the common femoral vein and including the common femoral, femoral, profunda femoral, popliteal and calf veins including the posterior tibial, peroneal and gastrocnemius veins when visible. The superficial great saphenous vein was also interrogated. Spectral Doppler was utilized to evaluate flow at rest and with distal augmentation maneuvers in the common femoral, femoral and popliteal veins. COMPARISON:  CT abdomen and pelvis - 11/23/2018 FINDINGS: RIGHT LOWER EXTREMITY Common Femoral Vein: There is mixed echogenic near occlusive DVT involving the right common femoral vein (image 8), as was suggested on preceding abdominal CT. Saphenofemoral Junction: No evidence of thrombus. Normal compressibility and flow on color Doppler imaging. There is hypoechoic nonocclusive thrombus involving the imaged portions of the right deep femoral vein (image 15), the proximal (image 16), mid (image 19) and distal (image 22) aspects of the right femoral vein. There is hypoechoic near occlusive thrombus involving the proximal aspect of the right popliteal vein (image 27) extending to involve both paired posterior tibial veins (images 3132). Peroneal veins appear patent where imaged. Note is made of a trace right-sided  Baker cyst. _________________________________________________________ LEFT LOWER EXTREMITY Common Femoral Vein: No evidence of thrombus. Normal compressibility, respiratory phasicity and response to augmentation. Saphenofemoral Junction: No evidence of thrombus. Normal compressibility and flow on color Doppler imaging. Profunda Femoral Vein: There is mixed echogenic occlusive thrombus seen within the imaged portions of the right deep femoral vein (image 46). Femoral Vein: Proximal aspect of the right femoral vein is patent however there is hypoechoic occlusive thrombus involving the mid (image 53) and distal (image 56) aspects of left femoral vein. Popliteal Vein: No evidence of thrombus. Normal compressibility, respiratory phasicity and response to augmentation. Calf Veins: There is hypoechoic occlusive thrombus involving both paired left posterior tibial veins (image 64). The left peroneal veins appear patent where imaged. Superficial Great Saphenous Vein: No evidence of thrombus. Normal compressibility. IMPRESSION: 1. Examination is positive for mixed occlusive and nonocclusive DVT extending from the right common femoral vein to the paired right posterior tibial veins. 2. Examination is positive for tandem areas of occlusive DVT involving the left deep femoral, the mid and distal aspects of the left femoral and both paired left posterior tibial veins as detailed above. Electronically Signed   By: Sandi Mariscal M.D.   On: 11/24/2018 15:00     Assessment and plan Patient is a 73 y.o. female history of stage IIb pancreatic cancer present for discussion of image results and management of newly diagnosed bilateral lower extremity DVT. 1. Malignant neoplasm of head of pancreas ([place])   2. Anemia due to antineoplastic chemotherapy   3. Moderate protein-calorie malnutrition ([place])   4. Acute deep vein thrombosis (DVT) of proximal vein of both lower extremities ([facility])    #Stage IIb pancreatic cancer status post  surgical resection. Patient received 7 cycles of mFOLFIRINOX with moderate difficulties. Currently chemotherapy is on hold to allow her to recover. Overall condition has slightly improved,  Hold chemotherapy and allow more time for her to recover. Plan monotherapy gemcitaibine when her condition improves.   #  Acute bilateral lower extremity DVT Tolerating Eliquis, continue.  # Diarrhea, continue Creon.   #Anemia secondary to chemotherapy, hemoglobin improved to 8.1.  Continue to monitor.  # Hypokalemia, improved, K is 3.9.  Continue oral potassium treatment.  # Hypomagnesia, continue slow mag.  # Neuropathy, continue gabapentin.  .Repeat labs in 2 weeks  MD assessment for evaluation of Gemcitabine.   We spent sufficient time to discuss many aspect of care, questions were answered to patient's satisfaction. Total face to face encounter time for this patient visit was 25 min. >50% of the time was  spent in counseling and coordination of care.     Earlie Server, MD, PhD  12/05/2018

## 2018-12-13 ENCOUNTER — Other Ambulatory Visit: Payer: Self-pay | Admitting: *Deleted

## 2018-12-13 MED ORDER — APIXABAN 5 MG PO TABS: 5.0000 mg | ORAL_TABLET | Freq: Two times a day (BID) | ORAL | 0 refills | Status: DC

## 2018-12-14 ENCOUNTER — Other Ambulatory Visit: Payer: Self-pay

## 2018-12-15 ENCOUNTER — Encounter: Payer: Self-pay | Admitting: Oncology

## 2018-12-15 ENCOUNTER — Inpatient Hospital Stay: Payer: Medicare Other

## 2018-12-15 ENCOUNTER — Inpatient Hospital Stay: Payer: Medicare Other | Attending: Oncology

## 2018-12-15 ENCOUNTER — Inpatient Hospital Stay (HOSPITAL_BASED_OUTPATIENT_CLINIC_OR_DEPARTMENT_OTHER): Payer: Medicare Other | Admitting: Oncology

## 2018-12-15 ENCOUNTER — Other Ambulatory Visit: Payer: Self-pay

## 2018-12-15 VITALS — BP 117/86 | HR 106 | Temp 97.6°F | Resp 18 | Wt 127.6 lb

## 2018-12-15 DIAGNOSIS — I10 Essential (primary) hypertension: Secondary | ICD-10-CM

## 2018-12-15 DIAGNOSIS — D6481 Anemia due to antineoplastic chemotherapy: Secondary | ICD-10-CM

## 2018-12-15 DIAGNOSIS — E440 Moderate protein-calorie malnutrition: Secondary | ICD-10-CM

## 2018-12-15 DIAGNOSIS — Z7901 Long term (current) use of anticoagulants: Secondary | ICD-10-CM

## 2018-12-15 DIAGNOSIS — M25511 Pain in right shoulder: Secondary | ICD-10-CM | POA: Insufficient documentation

## 2018-12-15 DIAGNOSIS — Z79899 Other long term (current) drug therapy: Secondary | ICD-10-CM | POA: Insufficient documentation

## 2018-12-15 DIAGNOSIS — R438 Other disturbances of smell and taste: Secondary | ICD-10-CM | POA: Diagnosis not present

## 2018-12-15 DIAGNOSIS — R600 Localized edema: Secondary | ICD-10-CM | POA: Diagnosis not present

## 2018-12-15 DIAGNOSIS — D696 Thrombocytopenia, unspecified: Secondary | ICD-10-CM

## 2018-12-15 DIAGNOSIS — Z452 Encounter for adjustment and management of vascular access device: Secondary | ICD-10-CM | POA: Insufficient documentation

## 2018-12-15 DIAGNOSIS — E039 Hypothyroidism, unspecified: Secondary | ICD-10-CM

## 2018-12-15 DIAGNOSIS — Z87891 Personal history of nicotine dependence: Secondary | ICD-10-CM

## 2018-12-15 DIAGNOSIS — K219 Gastro-esophageal reflux disease without esophagitis: Secondary | ICD-10-CM | POA: Insufficient documentation

## 2018-12-15 DIAGNOSIS — E871 Hypo-osmolality and hyponatremia: Secondary | ICD-10-CM

## 2018-12-15 DIAGNOSIS — Z9221 Personal history of antineoplastic chemotherapy: Secondary | ICD-10-CM | POA: Insufficient documentation

## 2018-12-15 DIAGNOSIS — Z90411 Acquired partial absence of pancreas: Secondary | ICD-10-CM | POA: Diagnosis not present

## 2018-12-15 DIAGNOSIS — K909 Intestinal malabsorption, unspecified: Secondary | ICD-10-CM | POA: Insufficient documentation

## 2018-12-15 DIAGNOSIS — T451X5A Adverse effect of antineoplastic and immunosuppressive drugs, initial encounter: Secondary | ICD-10-CM

## 2018-12-15 DIAGNOSIS — C250 Malignant neoplasm of head of pancreas: Secondary | ICD-10-CM | POA: Diagnosis present

## 2018-12-15 DIAGNOSIS — I824Y3 Acute embolism and thrombosis of unspecified deep veins of proximal lower extremity, bilateral: Secondary | ICD-10-CM

## 2018-12-15 DIAGNOSIS — R5383 Other fatigue: Secondary | ICD-10-CM

## 2018-12-15 DIAGNOSIS — T451X5S Adverse effect of antineoplastic and immunosuppressive drugs, sequela: Secondary | ICD-10-CM | POA: Insufficient documentation

## 2018-12-15 DIAGNOSIS — R197 Diarrhea, unspecified: Secondary | ICD-10-CM | POA: Insufficient documentation

## 2018-12-15 DIAGNOSIS — R7989 Other specified abnormal findings of blood chemistry: Secondary | ICD-10-CM | POA: Insufficient documentation

## 2018-12-15 LAB — SAMPLE TO BLOOD BANK

## 2018-12-15 LAB — CBC WITH DIFFERENTIAL/PLATELET
Abs Immature Granulocytes: 0.01 10*3/uL (ref 0.00–0.07)
Basophils Absolute: 0 10*3/uL (ref 0.0–0.1)
Basophils Relative: 1 %
Eosinophils Absolute: 0.1 10*3/uL (ref 0.0–0.5)
Eosinophils Relative: 2 %
HCT: 26.7 % — ABNORMAL LOW (ref 36.0–46.0)
Hemoglobin: 8.5 g/dL — ABNORMAL LOW (ref 12.0–15.0)
Immature Granulocytes: 0 %
Lymphocytes Relative: 30 %
Lymphs Abs: 1.7 10*3/uL (ref 0.7–4.0)
MCH: 29.7 pg (ref 26.0–34.0)
MCHC: 31.8 g/dL (ref 30.0–36.0)
MCV: 93.4 fL (ref 80.0–100.0)
Monocytes Absolute: 0.3 10*3/uL (ref 0.1–1.0)
Monocytes Relative: 5 %
Neutro Abs: 3.6 10*3/uL (ref 1.7–7.7)
Neutrophils Relative %: 62 %
Platelets: 173 10*3/uL (ref 150–400)
RBC: 2.86 MIL/uL — ABNORMAL LOW (ref 3.87–5.11)
RDW: 17.3 % — ABNORMAL HIGH (ref 11.5–15.5)
WBC: 5.8 10*3/uL (ref 4.0–10.5)
nRBC: 0 % (ref 0.0–0.2)

## 2018-12-15 LAB — COMPREHENSIVE METABOLIC PANEL
ALT: 19 U/L (ref 0–44)
AST: 49 U/L — ABNORMAL HIGH (ref 15–41)
Albumin: 2.8 g/dL — ABNORMAL LOW (ref 3.5–5.0)
Alkaline Phosphatase: 126 U/L (ref 38–126)
Anion gap: 7 (ref 5–15)
BUN: 9 mg/dL (ref 8–23)
CO2: 25 mmol/L (ref 22–32)
Calcium: 9.5 mg/dL (ref 8.9–10.3)
Chloride: 104 mmol/L (ref 98–111)
Creatinine, Ser: 0.42 mg/dL — ABNORMAL LOW (ref 0.44–1.00)
GFR calc Af Amer: >60 mL/min (ref >60)
GFR calc non Af Amer: >60 mL/min (ref >60)
Glucose, Bld: 103 mg/dL — ABNORMAL HIGH (ref 70–99)
Potassium: 3.8 mmol/L (ref 3.5–5.1)
Sodium: 136 mmol/L (ref 135–145)
Total Bilirubin: 0.4 mg/dL (ref 0.3–1.2)
Total Protein: 6.4 g/dL — ABNORMAL LOW (ref 6.5–8.1)

## 2018-12-15 LAB — MAGNESIUM: Magnesium: 1.8 mg/dL (ref 1.7–2.4)

## 2018-12-15 MED ORDER — HEPARIN SOD (PORK) LOCK FLUSH 100 UNIT/ML IV SOLN
500.0000 [IU] | Freq: Once | INTRAVENOUS | Status: AC
  Administered 2018-12-15: 500 [IU] via INTRAVENOUS
  Filled 2018-12-15: qty 5

## 2018-12-15 MED ORDER — SODIUM CHLORIDE 0.9% FLUSH
10.0000 mL | INTRAVENOUS | Status: DC | PRN
  Administered 2018-12-15: 09:00:00 10 mL via INTRAVENOUS
  Filled 2018-12-15: qty 10

## 2018-12-15 NOTE — Progress Notes (Signed)
Patient here for follow up. Pt wanting to know how "the blood clots [in her legs] are doing."  Complains of pain 5/10 to hands

## 2018-12-15 NOTE — Progress Notes (Signed)
Per Almyra Free CMA per Dr. Tasia Catchings no treatment at this time. Pt stable at discharge.

## 2018-12-16 ENCOUNTER — Inpatient Hospital Stay: Payer: Medicare Other

## 2018-12-16 ENCOUNTER — Telehealth: Payer: Self-pay

## 2018-12-16 DIAGNOSIS — M79641 Pain in right hand: Secondary | ICD-10-CM

## 2018-12-16 MED ORDER — TRAMADOL HCL 50 MG PO TABS: 50.0000 mg | ORAL_TABLET | Freq: Three times a day (TID) | ORAL | 0 refills | Status: AC | PRN

## 2018-12-16 NOTE — Telephone Encounter (Signed)
Called and spoke to [place] at [facility].  Gave verbal orders to extend PT as requested 3w 1.    Jeani Hawking also stated that patient has been c/o pain in both hands and wanted to know if provider could prescribe something in addition to gabepentin for pain.

## 2018-12-16 NOTE — Telephone Encounter (Signed)
Copied from [place] [id number] [id number]. Topic: General - Other >> Dec 16, 2018 12:22 PM Rainey Pines A wrote: Jeani Hawking is requesting verbal orders  an Extenstion  for PT at 3w1 and would like a callback at [phone]

## 2018-12-16 NOTE — Telephone Encounter (Signed)
[facility] Called and spoke to [place] at [facility].  Gave verbal orders to extend PT as requested 3w 1.    Jeani Hawking also stated that patient has been c/o pain in both hands and wanted to know if provider could prescribe something in addition to gabepentin for pain.

## 2018-12-16 NOTE — Progress Notes (Signed)
Hematology/Oncology Follow Up Note [facility]  [phone] [phone]  Patient Care Team: Jodelle Green, FNP as PCP - General (Family Medicine) Clent Jacks, RN as Registered Nurse   Name of the patient: Lynn Taylor  [id number]  [birth year]   REASON FOR VISIT  follow-up for adjuvant chemotherapy for pancreatic cancer.  HISTORY OF PRESENTING ILLNESS:  RHETTA Taylor is a  73 y.o.  female with PMH listed below who was referred to me for evaluation of evaluation of elevated ferritin. Patient recently had lab work-up done on 04/09/2018 which showed ferritin level 1877, iron 97, TIBC 340, iron saturation 29, 04/11/2018 CBC showed hemoglobin 13.2, MCV 81, WBC 7.6, platelet counts 246,000. Patient was referred to hematology for further evaluation of elevated ferritin.  #04/28/2018 Ultrasound of liver was obtained which showed CBD and intrahepatic biliary dilatation Patient wa she is s advised to go to emergency room for further evaluation. Also had hyperbilirubinemia.  Patient was complaining about being jaundiced, pruritus all over. MR abdomen MRCP with and without contrast showed market biliary duct dilatation, secondary to obstruction in the region of pancreatic head.  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 No abdominal adenopathy, liver metastasis or cross vascular involvement.  patient was evaluated by gastroenterology and status post ERCP with stenting. CA 19.9 1173 CEA 3.9 # 05/25/2018  patient was referred to [facility] and s/p whipple resection.Her postop course was c/b Type A pancreatic leak and c.diff. Pathology showed A. Hepatic artery lymph node, excision: One lymph node, negative for malignancy (0/1). B. Biliary stent, removal: Medical device, consistent with stent, gross examination only. C. 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 All margins are negative (closest margin=uncinate, 0.5 mm) Metastatic adenocarcinoma in one of thirty-two lymph nodes (1/32).  Portion of stomach and duodenum with no specific pathologic diagnosis.   Grade, 3 poorly differentiated.  Pathologic pT2 pN1  Cancer Treatment 07/13/2018 Cycle 1 FOLFIRINOX with Onpro Neulasta.  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 10/06/2018 cycle 6  mFOLFIRINOX with Onpro Neulasta - [50% dose for Irinotecan- 5-FU bolus omitted]. 10/20/2018 cycle 7 mFOLFIRINOX with Onpro Neulasta - [50% dose for Irinotecan- 5-FU bolus omitted].  INTERVAL HISTORY DEJANIQUE Taylor is a 73 y.o. female with history of stage IIb pancreatic cancer presents to for evaluation prior to chemotherapy and also management of DVT.  Chemotherapy has been held since May 2020 due to poor tolerability. Decision was made to allow her to recover and may be proceed with single agent gemcitabine. Patient reports feeling better. She takes Eliquis 5 mg twice daily and tolerates well.  No bleeding events. Bilateral lower extremity swelling has significantly improved.  Chronic weakness and fatigue, improving. Appetite remains poor.  Reports food does not taste well.  She does not take Marinol. Weight has been stable.  Patient takes Creon 36,000 units 3 times a day with meals.  Denies any abdominal pain or  bloating. Intermittent diarrhea.  Denies SOB or  chest pain.   Review of Systems  Constitutional: Positive for appetite change and fatigue. Negative for chills, fever and unexpected weight change.  HENT:   Negative for hearing loss and voice change.   Eyes: Negative for eye problems.  Respiratory: Negative for chest tightness, cough and shortness of breath.   Cardiovascular: Positive for leg swelling. Negative for chest pain.  Gastrointestinal: Negative for abdominal distention, abdominal pain, blood in stool and diarrhea.  Endocrine: Negative for hot flashes.  Genitourinary: Negative for difficulty urinating and frequency.   Musculoskeletal: Negative for arthralgias.  Skin: Negative for itching and rash.  Neurological: Negative for extremity weakness.  Hematological: Negative for adenopathy.  Psychiatric/Behavioral: Negative for confusion.      No Known Allergies   Past Medical History:  Diagnosis Date   Anemia due to antineoplastic chemotherapy 11/25/2018   Cancer [facility])    GERD (gastroesophageal reflux disease)    Hypertension    Hypomagnesemia 11/25/2018   Hypothyroidism    Malignant neoplasm of head of pancreas ([place]) 06/08/2018     Past Surgical History:  Procedure Laterality Date   CHOLECYSTECTOMY     COLONOSCOPY N/A 12/02/2016   Procedure: COLONOSCOPY;  Surgeon: Lollie Sails, MD;  Location: [facility] ENDOSCOPY;  Service: Endoscopy;  Laterality: N/A;   ERCP N/A 04/30/2018   Procedure: ENDOSCOPIC RETROGRADE CHOLANGIOPANCREATOGRAPHY (ERCP);  Surgeon: Lucilla Lame, MD;  Location: [facility] ENDOSCOPY;  Service: Endoscopy;  Laterality: N/A;   ERCP N/A 05/05/2018   Procedure: ENDOSCOPIC RETROGRADE CHOLANGIOPANCREATOGRAPHY (ERCP);  Surgeon: Lucilla Lame, MD;  Location: [facility] ENDOSCOPY;  Service: Endoscopy;  Laterality: N/A;   PORTA CATH INSERTION N/A 06/28/2018   Procedure: PORTA CATH INSERTION;  Surgeon: Algernon Huxley, MD;  Location: [place] [facility] LAB;  Service:  Cardiovascular;  Laterality: N/A;    Social History   Socioeconomic History   Marital status: Married    Spouse name: Not on file   Number of children: Not on file   Years of education: Not on file   Highest education level: Not on file  Occupational History   Not on file  Social Needs   Financial resource strain: Not on file   Food insecurity    Worry: Not on file    Inability: Not on file   Transportation needs    Medical: Not on file    Non-medical: Not on file  Tobacco Use   Smoking status: Former Smoker    Quit date: 04/14/1988    Years since quitting: 30.6   Smokeless tobacco: Never Used  Substance and Sexual Activity   Alcohol use: Yes    Comment: social drinker   Drug use: No   Sexual activity: Not Currently  Lifestyle   Physical activity    Days per week: Not on file    Minutes per session: Not on file   Stress: Not on file  Relationships   Social connections    Talks on phone: Not on file    Gets together: Not on file    Attends religious service: Not on file    Active member of club or organization: Not on file    Attends meetings of clubs or organizations: Not on file    Relationship status: Not on file   Intimate partner violence    Fear of current or ex partner: Not on file    Emotionally abused: Not on file    Physically abused: Not on file    Forced sexual activity: Not on file  Other Topics Concern  Not on file  Social History Narrative   Not on file    Family History  Problem Relation Age of Onset   Diabetes Mellitus I Other    Alcoholism Other    Hypertension Other    Hyperlipidemia Other    Coronary artery disease Other    Stroke Other    Osteoarthritis Other    Migraines Other    Heart Problems Mother    Stroke Sister    CAD Brother    Stroke Brother    Breast cancer Neg Hx      Current Outpatient Medications:    apixaban (ELIQUIS) 5 MG TABS tablet, Take 1 tablet (5 mg total) by mouth 2  (two) times daily., Disp: 180 tablet, Rfl: 0   chlorhexidine (PERIDEX) 0.12 % solution, Use as directed 15 mLs in the mouth or throat 2 (two) times daily., Disp: 473 mL, Rfl: 3   Cholecalciferol (VITAMIN D-3) 25 MCG (1000 UT) CAPS, Take by mouth., Disp: , Rfl:    dexamethasone (DECADRON) 4 MG tablet, Take 2 tablets (8 mg total) by mouth See admin instructions. Start the day after chemo for 2 days., Disp: 30 tablet, Rfl: 0   gabapentin (NEURONTIN) 300 MG capsule, Take 1 capsule (300 mg total) by mouth 3 (three) times daily., Disp: 90 capsule, Rfl: 0   levothyroxine (SYNTHROID, LEVOTHROID) 100 MCG tablet, Take 1 tablet (100 mcg total) by mouth daily before breakfast., Disp: 90 tablet, Rfl: 4   lidocaine-prilocaine (EMLA) cream, Apply 1 application topically as needed., Disp: 30 g, Rfl: 3   lipase/protease/amylase (CREON) 36000 UNITS CPEP capsule, Take 1 capsule (36,000 Units total) by mouth 3 (three) times daily with meals., Disp: 180 capsule, Rfl: 0   loperamide (IMODIUM) 2 MG capsule, Take 1 capsule (2 mg total) by mouth See admin instructions. With onset of loose stool, take 4mg  followed by 2mg  every 2 hours until 12 hours have passed without loose bowel movement. Maximum: 16 mg/day, Disp: 120 capsule, Rfl: 1   loratadine (CLARITIN) 10 MG tablet, Take 10 mg by mouth daily., Disp: , Rfl:    Magnesium Cl-Calcium Carbonate (SLOW-MAG PO), Take 2 tablets by mouth daily., Disp: , Rfl:    nystatin (MYCOSTATIN) 100000 UNIT/ML suspension, Take 5 mLs (500,000 Units total) by mouth 4 (four) times daily., Disp: 473 mL, Rfl: 1   omeprazole (PRILOSEC) 20 MG capsule, Take 1 capsule (20 mg total) by mouth daily., Disp: 90 capsule, Rfl: 0   ondansetron (ZOFRAN) 4 MG tablet, Take 1 tablet (4 mg total) by mouth every 6 (six) hours as needed for nausea or vomiting., Disp: 90 tablet, Rfl: 1   potassium chloride SA (K-DUR) 20 MEQ tablet, Take 2 tablets (40 mEq total) by mouth daily., Disp: 60 tablet, Rfl:  0   Eliquis DVT/PE Starter Pack (ELIQUIS STARTER PACK) 5 MG TABS, Take as directed on package: start with two-5mg  tablets twice daily for 7 days. On day 8, switch to one-5mg  tablet twice daily. (Patient not taking: Reported on 12/15/2018), Disp: 1 each, Rfl: 0   traMADol (ULTRAM) 50 MG tablet, Take 1 tablet (50 mg total) by mouth every 8 (eight) hours as needed for up to 5 days., Disp: 15 tablet, Rfl: 0  Physical exam: ECOG 2 Vitals:   12/15/18 0934  BP: 117/86  Pulse: (!) 106  Resp: 18  Temp: 97.6 F (36.4 C)  SpO2: 99%  Weight: 127 lb 9.6 oz (57.9 kg)   Physical Exam Constitutional:      General:  She is not in acute distress.    Appearance: She is ill-appearing.     Comments: Cachectic  HENT:     Head: Normocephalic and atraumatic.  Eyes:     General: No scleral icterus.    Pupils: Pupils are equal, round, and reactive to light.  Neck:     Musculoskeletal: Normal range of motion and neck supple.  Cardiovascular:     Rate and Rhythm: Regular rhythm. Tachycardia present.     Heart sounds: Normal heart sounds.  Pulmonary:     Effort: Pulmonary effort is normal. No respiratory distress.     Breath sounds: No wheezing.  Abdominal:     General: Bowel sounds are normal. There is no distension.     Palpations: Abdomen is soft. There is no mass.     Tenderness: There is no abdominal tenderness.  Musculoskeletal: Normal range of motion.        General: Swelling present. No deformity.  Skin:    General: Skin is warm and dry.     Findings: No erythema or rash.  Neurological:     Mental Status: She is alert and oriented to person, place, and time.     Cranial Nerves: No cranial nerve deficit.     Coordination: Coordination normal.  Psychiatric:        Behavior: Behavior normal.        Thought Content: Thought content normal.     CMP Latest Ref Rng & Units 12/15/2018  Glucose 70 - 99 mg/dL 103(H)  BUN 8 - 23 mg/dL 9  Creatinine 0.44 - 1.00 mg/dL 0.42(L)  Sodium 135 - 145  mmol/L 136  Potassium 3.5 - 5.1 mmol/L 3.8  Chloride 98 - 111 mmol/L 104  CO2 22 - 32 mmol/L 25  Calcium 8.9 - 10.3 mg/dL 9.5  Total Protein 6.5 - 8.1 g/dL 6.4(L)  Total Bilirubin 0.3 - 1.2 mg/dL 0.4  Alkaline Phos 38 - 126 U/L 126  AST 15 - 41 U/L 49(H)  ALT 0 - 44 U/L 19   CBC Latest Ref Rng & Units 12/15/2018  WBC 4.0 - 10.5 K/uL 5.8  Hemoglobin 12.0 - 15.0 g/dL 8.5(L)  Hematocrit 36.0 - 46.0 % 26.7(L)  Platelets 150 - 400 K/uL 173   RADIOGRAPHIC STUDIES: I have personally reviewed the radiological images as listed and agreed with the findings in the report.  Ct Abdomen Pelvis W Contrast  Result Date: 11/24/2018 CLINICAL DATA:  Malignant neoplasm of the pancreas. EXAM: CT ABDOMEN AND PELVIS WITH CONTRAST TECHNIQUE: Multidetector CT imaging of the abdomen and pelvis was performed using the standard protocol following bolus administration of intravenous contrast. CONTRAST:  155mL OMNIPAQUE IOHEXOL 300 MG/ML  SOLN COMPARISON:  07/02/2018 FINDINGS: Lower chest: No acute abnormality. Hepatobiliary: Small low-density structure in left lobe of liver is unchanged measuring 5 mm, image 13/2. No biliary ductal dilatation. Pancreas: Postoperative change from Whipple procedure identified. No complicating features identified. No main duct dilatation or inflammation. No mass identified. Spleen: Normal in size without focal abnormality. Adrenals/Urinary Tract: Adrenal glands are unremarkable. Kidneys are normal, without renal calculi, focal lesion, or hydronephrosis. Bladder is unremarkable. Stomach/Bowel: Postoperative anatomy of the stomach and proximal small bowel compatible with with procedure. No dilated loops of small or large bowel identified. The appendix is visualized and appears normal. Vascular/Lymphatic: Aortic atherosclerosis without aneurysm. The portal vein appears patent. No abdominal or pelvic adenopathy identified. Bilateral common femoral veins are distended and contain low-attenuation  filling defects compatible with thrombus. Reproductive: Uterus  and bilateral adnexa are unremarkable. Other: No free fluid or fluid collections. No peritoneal nodule or mass identified. Musculoskeletal: Spondylosis within the lumbar spine. IMPRESSION: 1. Stable appearance of the abdomen status post Whipple procedure. No specific findings identified to suggest recurrent tumor or metastatic disease. 2. Bilateral low-attenuation filling defects within the common femoral veins concerning for DVT. Further evaluation with bilateral lower extremity Dopplers recommended. 3. Critical Value/emergent results were called by telephone at the time of interpretation on 11/24/2018 at 9:22 am to Dr. Earlie Server , who verbally acknowledged these results. 4.  Aortic Atherosclerosis (ICD10-I70.0). 5. Electronically Signed   By: Kerby Moors M.D.   On: 11/24/2018 09:23   US Venous Img Lower Bilateral  Result Date: 11/24/2018 CLINICAL DATA:  Bilateral lower extremity pain and edema. Concern for DVT on preceding CT scan of the abdomen pelvis. History of malignancy. Former smoker. Evaluate for DVT. EXAM: BILATERAL LOWER EXTREMITY VENOUS DOPPLER ULTRASOUND TECHNIQUE: Gray-scale sonography with graded compression, as well as color Doppler and duplex ultrasound were performed to evaluate the lower extremity deep venous systems from the level of the common femoral vein and including the common femoral, femoral, profunda femoral, popliteal and calf veins including the posterior tibial, peroneal and gastrocnemius veins when visible. The superficial great saphenous vein was also interrogated. Spectral Doppler was utilized to evaluate flow at rest and with distal augmentation maneuvers in the common femoral, femoral and popliteal veins. COMPARISON:  CT abdomen and pelvis - 11/23/2018 FINDINGS: RIGHT LOWER EXTREMITY Common Femoral Vein: There is mixed echogenic near occlusive DVT involving the right common femoral vein (image 8), as was suggested  on preceding abdominal CT. Saphenofemoral Junction: No evidence of thrombus. Normal compressibility and flow on color Doppler imaging. There is hypoechoic nonocclusive thrombus involving the imaged portions of the right deep femoral vein (image 15), the proximal (image 16), mid (image 19) and distal (image 22) aspects of the right femoral vein. There is hypoechoic near occlusive thrombus involving the proximal aspect of the right popliteal vein (image 27) extending to involve both paired posterior tibial veins (images 3132). Peroneal veins appear patent where imaged. Note is made of a trace right-sided Baker cyst. _________________________________________________________ LEFT LOWER EXTREMITY Common Femoral Vein: No evidence of thrombus. Normal compressibility, respiratory phasicity and response to augmentation. Saphenofemoral Junction: No evidence of thrombus. Normal compressibility and flow on color Doppler imaging. Profunda Femoral Vein: There is mixed echogenic occlusive thrombus seen within the imaged portions of the right deep femoral vein (image 46). Femoral Vein: Proximal aspect of the right femoral vein is patent however there is hypoechoic occlusive thrombus involving the mid (image 53) and distal (image 56) aspects of left femoral vein. Popliteal Vein: No evidence of thrombus. Normal compressibility, respiratory phasicity and response to augmentation. Calf Veins: There is hypoechoic occlusive thrombus involving both paired left posterior tibial veins (image 64). The left peroneal veins appear patent where imaged. Superficial Great Saphenous Vein: No evidence of thrombus. Normal compressibility. IMPRESSION: 1. Examination is positive for mixed occlusive and nonocclusive DVT extending from the right common femoral vein to the paired right posterior tibial veins. 2. Examination is positive for tandem areas of occlusive DVT involving the left deep femoral, the mid and distal aspects of the left femoral and  both paired left posterior tibial veins as detailed above. Electronically Signed   By: Sandi Mariscal M.D.   On: 11/24/2018 15:00     Assessment and plan Patient is a 73 y.o. female history of stage IIb pancreatic cancer  present for discussion of image results and management of newly diagnosed bilateral lower extremity DVT. 1. Malignant neoplasm of head of pancreas ([place])   2. Moderate protein-calorie malnutrition ([place])   3. Acute deep vein thrombosis (DVT) of proximal vein of both lower extremities ([facility])   4. Anemia due to antineoplastic chemotherapy    #Stage IIB pancreatic cancer status post surgical resection. Patient received 7 cycles of mFOLFIRINOX with moderate difficulties. Currently chemotherapy is on hold to allow her to recover. Labs reviewed and discussed with patient. Continue to hold chemotherapy.  See below.  #Persistent anemia, hemoglobin has not returned to prechemotherapy baseline. Hemoglobin is at 8.5, slightly better comparing to 1 week ago.  #Protein calorie malnutrition due to malabsorption and a poor oral intake. She cannot tolerate Marinol. Continue follow-up with dietitian.  #Acute bilateral lower extremity DVT, tolerates Eliquis 5 mg twice daily continue.  # Diarrhea, continue Creon.   .Repeat labs in 2 weeks  MD assessment for evaluation of Gemcitabine.  If still not be able to resume chemotherapy, will discuss about omitting the remaining chemotherapy.  Continue close surveillance.  Patient agrees with the plan.  We spent sufficient time to discuss many aspect of care, questions were answered to patient's satisfaction. Total face to face encounter time for this patient visit was 25 min. >50% of the time was  spent in counseling and coordination of care.    Earlie Server, MD, PhD 12/15/2018

## 2018-12-16 NOTE — Progress Notes (Signed)
Nutrition Follow-up:  RD working remotely.  Patient with pancreatic cancer, s/p whipple.  Patient currently off of chemotherapy.   Spoke with patient via phone. Patient reports that she is feeling better and eating better.  Reports that she ate cereal for breakfast yesterday, hot links sausage for lunch with bread and fruit (melons) and had shrimp salad last night for dinner.  Also yesterday ate cashews with cheese and cranberries yesterday for snack.  Has not tried the oral supplements again.  Patient reports taste alterations but can taste mustard flavors and horseradish flavors better.    Patient denies nausea. Reports that last bowel movement was this am (normal). Last diarrhea stool was about 1 week ago.    Medications: reviewed  Labs: reviewed  Anthropometrics:   127 lb noted on 7/8 slight increase from 126 lb on 6/18.     NUTRITION DIAGNOSIS: Inadequate oral intake continues   INTERVENTION:  Encouraged good sources of protein at every meal.  Was not planning on protein source at dinner tonight.  Examples provided.   Encouraged trying oral nutrition supplements again.  Patient has contact information    MONITORING, EVALUATION, GOAL: Patient will consume adequate calories and protein to prevent further weight loss   NEXT VISIT: Thursday, July 30th  Nikolaj Geraghty B. Zenia Resides, [place], [place] Registered Dietitian [phone] (pager)

## 2018-12-16 NOTE — Telephone Encounter (Signed)
I sent in Rx of tramadol for patient  Due to it being first Rx, can only do 5 day supply for now  If she needs more after the 5 days -- let me know  LG

## 2018-12-17 ENCOUNTER — Telehealth: Payer: Self-pay | Admitting: *Deleted

## 2018-12-17 NOTE — Telephone Encounter (Signed)
Called Pt and told her the Rx was sent to the pharmacy and that there was only 5 sent in if she needed more to let us know.

## 2018-12-17 NOTE — Telephone Encounter (Signed)
Jeani Hawking informed ok to restart PT

## 2018-12-17 NOTE — Telephone Encounter (Signed)
Yes she can resume PT

## 2018-12-17 NOTE — Telephone Encounter (Signed)
Patient reported to PT that Dr Tasia Catchings said she can resume exercises and she wants to check that this is true. Can she resume services for PT?

## 2018-12-27 ENCOUNTER — Other Ambulatory Visit: Payer: Self-pay | Admitting: Oncology

## 2018-12-27 DIAGNOSIS — C250 Malignant neoplasm of head of pancreas: Secondary | ICD-10-CM

## 2018-12-27 MED ORDER — GABAPENTIN 300 MG PO CAPS: 300.0000 mg | ORAL_CAPSULE | Freq: Three times a day (TID) | ORAL | 0 refills | Status: DC

## 2018-12-29 ENCOUNTER — Inpatient Hospital Stay: Payer: Medicare Other

## 2018-12-29 ENCOUNTER — Inpatient Hospital Stay (HOSPITAL_BASED_OUTPATIENT_CLINIC_OR_DEPARTMENT_OTHER): Payer: Medicare Other | Admitting: Oncology

## 2018-12-29 ENCOUNTER — Other Ambulatory Visit: Payer: Self-pay

## 2018-12-29 ENCOUNTER — Encounter: Payer: Self-pay | Admitting: Oncology

## 2018-12-29 VITALS — BP 108/75 | HR 56 | Temp 97.6°F | Resp 18 | Wt 131.4 lb

## 2018-12-29 DIAGNOSIS — C250 Malignant neoplasm of head of pancreas: Secondary | ICD-10-CM | POA: Diagnosis not present

## 2018-12-29 DIAGNOSIS — I10 Essential (primary) hypertension: Secondary | ICD-10-CM

## 2018-12-29 DIAGNOSIS — D696 Thrombocytopenia, unspecified: Secondary | ICD-10-CM

## 2018-12-29 DIAGNOSIS — Z9221 Personal history of antineoplastic chemotherapy: Secondary | ICD-10-CM | POA: Diagnosis not present

## 2018-12-29 DIAGNOSIS — R7989 Other specified abnormal findings of blood chemistry: Secondary | ICD-10-CM

## 2018-12-29 DIAGNOSIS — Z95828 Presence of other vascular implants and grafts: Secondary | ICD-10-CM

## 2018-12-29 DIAGNOSIS — Z79899 Other long term (current) drug therapy: Secondary | ICD-10-CM

## 2018-12-29 DIAGNOSIS — D6481 Anemia due to antineoplastic chemotherapy: Secondary | ICD-10-CM

## 2018-12-29 DIAGNOSIS — E440 Moderate protein-calorie malnutrition: Secondary | ICD-10-CM

## 2018-12-29 DIAGNOSIS — I824Y3 Acute embolism and thrombosis of unspecified deep veins of proximal lower extremity, bilateral: Secondary | ICD-10-CM

## 2018-12-29 DIAGNOSIS — R600 Localized edema: Secondary | ICD-10-CM

## 2018-12-29 DIAGNOSIS — C801 Malignant (primary) neoplasm, unspecified: Secondary | ICD-10-CM

## 2018-12-29 DIAGNOSIS — T451X5A Adverse effect of antineoplastic and immunosuppressive drugs, initial encounter: Secondary | ICD-10-CM

## 2018-12-29 DIAGNOSIS — R438 Other disturbances of smell and taste: Secondary | ICD-10-CM

## 2018-12-29 DIAGNOSIS — R5383 Other fatigue: Secondary | ICD-10-CM

## 2018-12-29 DIAGNOSIS — K219 Gastro-esophageal reflux disease without esophagitis: Secondary | ICD-10-CM

## 2018-12-29 DIAGNOSIS — Z90411 Acquired partial absence of pancreas: Secondary | ICD-10-CM | POA: Diagnosis not present

## 2018-12-29 DIAGNOSIS — T451X5S Adverse effect of antineoplastic and immunosuppressive drugs, sequela: Secondary | ICD-10-CM

## 2018-12-29 DIAGNOSIS — Z7901 Long term (current) use of anticoagulants: Secondary | ICD-10-CM

## 2018-12-29 DIAGNOSIS — M25511 Pain in right shoulder: Secondary | ICD-10-CM

## 2018-12-29 DIAGNOSIS — Z87891 Personal history of nicotine dependence: Secondary | ICD-10-CM

## 2018-12-29 DIAGNOSIS — E039 Hypothyroidism, unspecified: Secondary | ICD-10-CM

## 2018-12-29 DIAGNOSIS — K909 Intestinal malabsorption, unspecified: Secondary | ICD-10-CM

## 2018-12-29 DIAGNOSIS — E871 Hypo-osmolality and hyponatremia: Secondary | ICD-10-CM

## 2018-12-29 LAB — COMPREHENSIVE METABOLIC PANEL
ALT: 14 U/L (ref 0–44)
AST: 30 U/L (ref 15–41)
Albumin: 3.2 g/dL — ABNORMAL LOW (ref 3.5–5.0)
Alkaline Phosphatase: 92 U/L (ref 38–126)
Anion gap: 7 (ref 5–15)
BUN: 14 mg/dL (ref 8–23)
CO2: 25 mmol/L (ref 22–32)
Calcium: 10.1 mg/dL (ref 8.9–10.3)
Chloride: 104 mmol/L (ref 98–111)
Creatinine, Ser: 0.54 mg/dL (ref 0.44–1.00)
GFR calc Af Amer: >60 mL/min (ref >60)
GFR calc non Af Amer: >60 mL/min (ref >60)
Glucose, Bld: 107 mg/dL — ABNORMAL HIGH (ref 70–99)
Potassium: 3.6 mmol/L (ref 3.5–5.1)
Sodium: 136 mmol/L (ref 135–145)
Total Bilirubin: 0.5 mg/dL (ref 0.3–1.2)
Total Protein: 7.1 g/dL (ref 6.5–8.1)

## 2018-12-29 LAB — CBC WITH DIFFERENTIAL/PLATELET
Abs Immature Granulocytes: 0.02 10*3/uL (ref 0.00–0.07)
Basophils Absolute: 0 10*3/uL (ref 0.0–0.1)
Basophils Relative: 0 %
Eosinophils Absolute: 0.1 10*3/uL (ref 0.0–0.5)
Eosinophils Relative: 1 %
HCT: 29.4 % — ABNORMAL LOW (ref 36.0–46.0)
Hemoglobin: 9.2 g/dL — ABNORMAL LOW (ref 12.0–15.0)
Immature Granulocytes: 0 %
Lymphocytes Relative: 26 %
Lymphs Abs: 1.6 10*3/uL (ref 0.7–4.0)
MCH: 28.9 pg (ref 26.0–34.0)
MCHC: 31.3 g/dL (ref 30.0–36.0)
MCV: 92.5 fL (ref 80.0–100.0)
Monocytes Absolute: 0.4 10*3/uL (ref 0.1–1.0)
Monocytes Relative: 6 %
Neutro Abs: 4.1 10*3/uL (ref 1.7–7.7)
Neutrophils Relative %: 67 %
Platelets: 193 10*3/uL (ref 150–400)
RBC: 3.18 MIL/uL — ABNORMAL LOW (ref 3.87–5.11)
RDW: 16.5 % — ABNORMAL HIGH (ref 11.5–15.5)
WBC: 6.2 10*3/uL (ref 4.0–10.5)
nRBC: 0 % (ref 0.0–0.2)

## 2018-12-29 LAB — SAMPLE TO BLOOD BANK

## 2018-12-29 MED ORDER — SODIUM CHLORIDE 0.9% FLUSH
10.0000 mL | Freq: Once | INTRAVENOUS | Status: AC
  Administered 2018-12-29: 10 mL via INTRAVENOUS
  Filled 2018-12-29: qty 10

## 2018-12-29 MED ORDER — HEPARIN SOD (PORK) LOCK FLUSH 100 UNIT/ML IV SOLN
500.0000 [IU] | Freq: Once | INTRAVENOUS | Status: AC
  Administered 2018-12-29: 500 [IU] via INTRAVENOUS

## 2018-12-29 NOTE — Progress Notes (Signed)
Pt in for follow up, reports pain in r arm and shoulder for past 2 weeks and pain in left ear to jaw on left side.

## 2018-12-30 NOTE — Progress Notes (Addendum)
Hematology/Oncology Follow Up Note [facility]  Telephone:([phone] [phone]  Patient Care Team: Jodelle Green, FNP as PCP - General (Family Medicine) Clent Jacks, RN as Registered Nurse   Name of the patient: Lynn Taylor  [id number]  [birth year]   REASON FOR VISIT  follow-up for adjuvant chemotherapy for pancreatic cancer.  HISTORY OF PRESENTING ILLNESS:  Lynn Taylor is a  73 y.o.  female with PMH listed below who was referred to me for evaluation of evaluation of elevated ferritin. Patient recently had lab work-up done on 04/09/2018 which showed ferritin level 1877, iron 97, TIBC 340, iron saturation 29, 04/11/2018 CBC showed hemoglobin 13.2, MCV 81, WBC 7.6, platelet counts 246,000. Patient was referred to hematology for further evaluation of elevated ferritin.  #04/28/2018 Ultrasound of liver was obtained which showed CBD and intrahepatic biliary dilatation Patient wa she is s advised to go to emergency room for further evaluation. Also had hyperbilirubinemia.  Patient was complaining about being jaundiced, pruritus all over. MR abdomen MRCP with and without contrast showed market biliary duct dilatation, secondary to obstruction in the region of pancreatic head.  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 No abdominal adenopathy, liver metastasis or cross vascular involvement.  patient was evaluated by gastroenterology and status post ERCP with stenting. CA 19.9 1173 CEA 3.9 # 05/25/2018  patient was referred to [facility] and s/p whipple resection.Her postop course was c/b Type A pancreatic leak and c.diff. Pathology showed A. Hepatic artery lymph node, excision: One lymph node, negative for malignancy (0/1). B. Biliary stent, removal: Medical device, consistent with stent, gross examination only. C. Head of pancreas, duodenum, portion of stomach, pancreaticoduodenectomy (Whipple):  Pancreatic ductal adenocarcinoma, poorly differentiated, 3.2 cm, uncinate, confined to pancreas. All margins are negative (closest margin=uncinate, 0.5 mm) Metastatic adenocarcinoma in one of thirty-two lymph nodes (1/32).  Portion of stomach and duodenum with no specific pathologic diagnosis.   Grade, 3 poorly differentiated.  Pathologic pT2 pN1  Cancer Treatment 07/13/2018 Cycle 1 FOLFIRINOX with Onpro Neulasta.  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 10/06/2018 cycle 6  mFOLFIRINOX with Onpro Neulasta - [50% dose for Irinotecan- 5-FU bolus omitted]. 10/20/2018 cycle 7 mFOLFIRINOX with Onpro Neulasta - [50% dose for Irinotecan- 5-FU bolus omitted].  INTERVAL HISTORY Lynn Taylor is a 73 y.o. female with history of stage IIb pancreatic cancer presents to for evaluation prior to chemotherapy and also management of DVT.  Dose reduced modified FOLFIRINOX chemotherapy has been held since May 2020 due to poor tolerability. Decision was made to allow her to recover and may be proceed with single agent gemcitabine. Patient reports feeling better today. She informs me that " I do not miss chemo".  Appetite is slightly better.  And she has gained weight 4 pounds since last visit  She has resumed physical therapy at home. Bilateral lower extremity DVT.  Takes Eliquis 5 mg twice daily and tolerates well.  No bleeding events. Bilateral lower extremity swelling has improved Patient takes Creon 36,000 units 3 times  a day  with meals.  Denies any abdominal pain or bloating. Intermittent diarrhea, chronic Chronic fatigue has improved.  Denies SOB or chest pain.   Reports right upper extremity/shoulder pain, worsening with movement, for the past few days.  She cannot raise her right arm higher than shoulder level.  Review of Systems  Constitutional: Positive for appetite change and fatigue. Negative for chills, fever and unexpected weight change.  HENT:   Negative for hearing loss and voice change.   Eyes: Negative for eye problems.  Respiratory: Negative for chest tightness, cough and shortness of breath.   Cardiovascular: Positive for leg swelling. Negative for chest pain.  Gastrointestinal: Negative for abdominal distention, abdominal pain, blood in stool and diarrhea.  Endocrine: Negative for hot flashes.  Genitourinary: Negative for difficulty urinating and frequency.   Musculoskeletal: Negative for arthralgias.  Skin: Negative for itching and rash.  Neurological: Negative for extremity weakness.  Hematological: Negative for adenopathy.  Psychiatric/Behavioral: Negative for confusion.      No Known Allergies   Past Medical History:  Diagnosis Date  . Anemia due to antineoplastic chemotherapy 11/25/2018  . Cancer ([place])   . GERD (gastroesophageal reflux disease)   . Hypertension   . Hypomagnesemia 11/25/2018  . Hypothyroidism   . Malignant neoplasm of head of pancreas ([place]) 06/08/2018     Past Surgical History:  Procedure Laterality Date  . CHOLECYSTECTOMY    . COLONOSCOPY N/A 12/02/2016   Procedure: COLONOSCOPY;  Surgeon: Lollie Sails, MD;  Location: [facility] ENDOSCOPY;  Service: Endoscopy;  Laterality: N/A;  . ERCP N/A 04/30/2018   Procedure: ENDOSCOPIC RETROGRADE CHOLANGIOPANCREATOGRAPHY (ERCP);  Surgeon: Lucilla Lame, MD;  Location: [facility] ENDOSCOPY;  Service: Endoscopy;  Laterality: N/A;  . ERCP N/A 05/05/2018   Procedure: ENDOSCOPIC RETROGRADE CHOLANGIOPANCREATOGRAPHY (ERCP);   Surgeon: Lucilla Lame, MD;  Location: [facility] ENDOSCOPY;  Service: Endoscopy;  Laterality: N/A;  . PORTA CATH INSERTION N/A 06/28/2018   Procedure: PORTA CATH INSERTION;  Surgeon: Algernon Huxley, MD;  Location: [facility] LAB;  Service: Cardiovascular;  Laterality: N/A;    Social History   Socioeconomic History  . Marital status: Married    Spouse name: Not on file  . Number of children: Not on file  . Years of education: Not on file  . Highest education level: Not on file  Occupational History  . Not on file  Social Needs  . Financial resource strain: Not on file  . Food insecurity    Worry: Not on file    Inability: Not on file  . Transportation needs    Medical: Not on file    Non-medical: Not on file  Tobacco Use  . Smoking status: Former Smoker    Quit date: 04/14/1988    Years since quitting: 30.7  . Smokeless tobacco: Never Used  Substance and Sexual Activity  . Alcohol use: Yes    Comment: social drinker  . Drug use: No  . Sexual activity: Not Currently  Lifestyle  . Physical activity    Days per week: Not on file    Minutes per session: Not on file  . Stress: Not on file  Relationships  . Social Herbalist on phone: Not on file    Gets together: Not on file    Attends religious service: Not on file    Active member of club or organization: Not on file    Attends meetings of clubs or organizations: Not on file    Relationship status: Not on file  .  Intimate partner violence    Fear of current or ex partner: Not on file    Emotionally abused: Not on file    Physically abused: Not on file    Forced sexual activity: Not on file  Other Topics Concern  . Not on file  Social History Narrative  . Not on file    Family History  Problem Relation Age of Onset  . Diabetes Mellitus I Other   . Alcoholism Other   . Hypertension Other   . Hyperlipidemia Other   . Coronary artery disease Other   . Stroke Other   . Osteoarthritis Other   . Migraines  Other   . Heart Problems Mother   . Stroke Sister   . CAD Brother   . Stroke Brother   . Breast cancer Neg Hx      Current Outpatient Medications:  .  chlorhexidine (PERIDEX) 0.12 % solution, Use as directed 15 mLs in the mouth or throat 2 (two) times daily., Disp: 473 mL, Rfl: 3 .  Cholecalciferol (VITAMIN D-3) 25 MCG (1000 UT) CAPS, Take by mouth., Disp: , Rfl:  .  CREON 36000 units CPEP capsule, TAKE 1 CAPSULE BY MOUTH 3  TIMES DAILY WITH MEALS, Disp: 200 capsule, Rfl: 3 .  dexamethasone (DECADRON) 4 MG tablet, TAKE 2 TABLETS BY MOUTH AS  DIRECTED. START THE DAY  AFTER CHEMO FOR 2 DAYS., Disp: 30 tablet, Rfl: 0 .  ELIQUIS 5 MG TABS tablet, TAKE 1 TABLET BY MOUTH  TWICE DAILY, Disp: 180 tablet, Rfl: 0 .  gabapentin (NEURONTIN) 300 MG capsule, Take 1 capsule (300 mg total) by mouth 3 (three) times daily., Disp: 270 capsule, Rfl: 0 .  levothyroxine (SYNTHROID, LEVOTHROID) 100 MCG tablet, Take 1 tablet (100 mcg total) by mouth daily before breakfast., Disp: 90 tablet, Rfl: 4 .  lidocaine-prilocaine (EMLA) cream, Apply 1 application topically as needed., Disp: 30 g, Rfl: 3 .  loperamide (IMODIUM) 2 MG capsule, Take 1 capsule (2 mg total) by mouth See admin instructions. With onset of loose stool, take 4mg  followed by 2mg  every 2 hours until 12 hours have passed without loose bowel movement. Maximum: 16 mg/day, Disp: 120 capsule, Rfl: 1 .  loratadine (CLARITIN) 10 MG tablet, Take 10 mg by mouth daily., Disp: , Rfl:  .  Magnesium Cl-Calcium Carbonate (SLOW-MAG PO), Take 2 tablets by mouth daily., Disp: , Rfl:  .  nystatin (MYCOSTATIN) 100000 UNIT/ML suspension, TAKE 5 MLS (500,000 UNITS  TOTAL) BY MOUTH 4 (FOUR)  TIMES DAILY., Disp: 946 mL, Rfl: 0 .  omeprazole (PRILOSEC) 20 MG capsule, TAKE 1 CAPSULE BY MOUTH  DAILY, Disp: 90 capsule, Rfl: 0 .  ondansetron (ZOFRAN) 4 MG tablet, TAKE 1 TABLET BY MOUTH  EVERY 6 HOURS AS NEEDED FOR NAUSEA OR VOMITING., Disp: 180 tablet, Rfl: 1 .  potassium chloride  SA (K-DUR) 20 MEQ tablet, TAKE 2 TABLETS BY MOUTH  DAILY, Disp: 60 tablet, Rfl: 0 .  prochlorperazine (COMPAZINE) 10 MG tablet, TAKE 1 TABLET BY MOUTH  EVERY 6 HOURS AS NEEDED, Disp: 90 tablet, Rfl: 0  Physical exam: ECOG 2 Vitals:   12/29/18 1015  BP: 108/75  Pulse: (!) 56  Resp: 18  Temp: 97.6 F (36.4 C)  TempSrc: Tympanic  SpO2: 98%  Weight: 131 lb 6.4 oz (59.6 kg)   Physical Exam Constitutional:      General: She is not in acute distress.    Appearance: She is ill-appearing.     Comments: Cachectic  HENT:  Head: Normocephalic and atraumatic.  Eyes:     General: No scleral icterus.    Pupils: Pupils are equal, round, and reactive to light.  Neck:     Musculoskeletal: Normal range of motion and neck supple.  Cardiovascular:     Rate and Rhythm: Normal rate and regular rhythm.     Heart sounds: Normal heart sounds.  Pulmonary:     Effort: Pulmonary effort is normal. No respiratory distress.     Breath sounds: No wheezing.  Abdominal:     General: Bowel sounds are normal. There is no distension.     Palpations: Abdomen is soft. There is no mass.     Tenderness: There is no abdominal tenderness.  Musculoskeletal:        General: Swelling present. No deformity.     Comments: Limited right shoulder motion, due to the pain  Skin:    General: Skin is warm and dry.     Findings: No erythema or rash.  Neurological:     Mental Status: She is alert and oriented to person, place, and time.     Cranial Nerves: No cranial nerve deficit.     Coordination: Coordination normal.  Psychiatric:        Behavior: Behavior normal.        Thought Content: Thought content normal.     CMP Latest Ref Rng & Units 12/29/2018  Glucose 70 - 99 mg/dL 107(H)  BUN 8 - 23 mg/dL 14  Creatinine 0.44 - 1.00 mg/dL 0.54  Sodium 135 - 145 mmol/L 136  Potassium 3.5 - 5.1 mmol/L 3.6  Chloride 98 - 111 mmol/L 104  CO2 22 - 32 mmol/L 25  Calcium 8.9 - 10.3 mg/dL 10.1  Total Protein 6.5 - 8.1  g/dL 7.1  Total Bilirubin 0.3 - 1.2 mg/dL 0.5  Alkaline Phos 38 - 126 U/L 92  AST 15 - 41 U/L 30  ALT 0 - 44 U/L 14   CBC Latest Ref Rng & Units 12/29/2018  WBC 4.0 - 10.5 K/uL 6.2  Hemoglobin 12.0 - 15.0 g/dL 9.2(L)  Hematocrit 36.0 - 46.0 % 29.4(L)  Platelets 150 - 400 K/uL 193   RADIOGRAPHIC STUDIES: I have personally reviewed the radiological images as listed and agreed with the findings in the report.  No results found.   Assessment and plan Patient is a 73 y.o. female history of stage IIb pancreatic cancer present for discussion of image results and management of newly diagnosed bilateral lower extremity DVT. 1. Pain in joint of right shoulder   2. Malignant neoplasm of head of pancreas ([place])   3. Port-A-Cath in place   4. Acute deep vein thrombosis (DVT) of proximal vein of both lower extremities (HCC)   5. Anemia due to antineoplastic chemotherapy    #Stage IIB pancreatic cancer status post surgical resection. Patient received 7 cycles of mFOLFIRINOX with moderate difficulties. We had a lengthy discussion we had a lengthy discussion regarding resuming chemotherapy with single agent gemcitabine. Side effects was discussed.  Patient has decided not to proceed with additional adjuvant chemotherapy.  Continue history and physical examination every 3 months, CT every 3 to 6 months. Last CT was done in June 2020. Patient informs me that she has an appointment with [facility], and she may do additional images there.  #Anemia, chemotherapy-induced, slowly recovering.  Continue to monitor. #Protein calorie malnutrition due to malabsorption and poor oral intake. She has gained weight.  Continue follow-up with dietitian. Continue Creon  #Acute bilateral lower  extremity DVT, tolerates Eliquis 5 mg twice daily.  Continue. #Right upper extremity/shoulder pain, will obtain shoulder x-ray. #Discussed about keeping Mediport for 1 to 2 years.  Port flush every 6 to 8 weeks.  She agrees with  the plan. .Repeat labs in 2 months MD assessment    Earlie Server, MD, PhD Hematology Oncology [place] at [facility] 12/30/2018

## 2019-01-06 ENCOUNTER — Inpatient Hospital Stay: Payer: Medicare Other

## 2019-01-06 ENCOUNTER — Telehealth: Payer: Self-pay

## 2019-01-06 NOTE — Telephone Encounter (Signed)
Nutrition Follow-up:  Patient with pancreatic cancer s/p whipple.  Patient currently off chemotherapy and has decided not to start gemcitabine.  Spoke with patient via phone for nutrition follow-up.  Patient reports that her appetite is "ok"  Reports that she is eating whatever she has a taste for.  Reports mainly she is eating oatmeal for breakfast, sometimes an egg with sausage link and cheese toast.  Vegetables and rice are working well for her.  Eating small amounts of meat (chicken, liver, hot sausage links)  Reports regular, normal bowel movements.     Medications: reviewed  Labs: reviewed  Anthropometrics:   Weight increased to 131 lb 6.4 oz on 7/22 from 127 lb on 7/8   NUTRITION DIAGNOSIS: Inadequate oral intake stable   INTERVENTION:  Encouraged patient to continue to include foods with protein.  Encouraged small frequent high calorie, high protein foods Contact information provided and patient will reach out to RD if needed in the future    MONITORING, EVALUATION, GOAL: Patient will consume adequate calories and protein to prevent further weight loss.     NEXT VISIT: patient to contact RD  Peighton Edgin B. Zenia Resides, [place], [place] Registered Dietitian [phone] (pager)

## 2019-02-02 ENCOUNTER — Other Ambulatory Visit: Payer: Self-pay

## 2019-02-02 ENCOUNTER — Ambulatory Visit (INDEPENDENT_AMBULATORY_CARE_PROVIDER_SITE_OTHER): Payer: Medicare Other

## 2019-02-02 DIAGNOSIS — Z0000 Encounter for general adult medical examination without abnormal findings: Secondary | ICD-10-CM

## 2019-02-02 NOTE — Patient Instructions (Addendum)
  Ms. Lynn Taylor , Thank you for taking time to come for your Medicare Wellness Visit. I appreciate your ongoing commitment to your health goals. Please review the following plan we discussed and let me know if I can assist you in the future.   These are the goals we discussed: Goals      Patient Stated   . Follow up with Provider as scheduled (pt-stated)       This is a list of the screening recommended for you and due dates:  Health Maintenance  Topic Date Due  . Pneumonia vaccines (1 of 2 - PCV13) 04/10/2019*  . Mammogram  08/01/2019  . Colon Cancer Screening  12/03/2026  . DEXA scan (bone density measurement)  Completed  .  Hepatitis C: One time screening is recommended by Center for Disease Control  (CDC) for  adults born from [birth year] through 1965.   Completed  . Flu Shot  Discontinued  . Tetanus Vaccine  Discontinued  *Topic was postponed. The date shown is not the original due date.

## 2019-02-02 NOTE — Progress Notes (Signed)
Subjective:   Lynn Taylor is a 73 y.o. female who presents for an Initial Medicare Annual Wellness Visit.  Review of Systems    No ROS.  Medicare Wellness Virtual Visit.  Visual/audio telehealth visit, UTA vital signs.   See social history for additional risk factors.    Cardiac Risk Factors include: advanced age (>75men, >78 women);hypertension     Objective:    Today's Vitals   There is no height or weight on file to calculate BMI.  Advanced Directives 02/02/2019 12/29/2018 12/15/2018 12/01/2018 11/24/2018 11/17/2018 11/10/2018  Does Patient Have a Medical Advance Directive? No No No No No No No  Would patient like information on creating a medical advance directive? No - Patient declined - - - - - -    Current Medications (verified) Outpatient Encounter Medications as of 02/02/2019  Medication Sig  . chlorhexidine (PERIDEX) 0.12 % solution Use as directed 15 mLs in the mouth or throat 2 (two) times daily.  . Cholecalciferol (VITAMIN D-3) 25 MCG (1000 UT) CAPS Take by mouth.  . CREON 36000 units CPEP capsule TAKE 1 CAPSULE BY MOUTH 3  TIMES DAILY WITH MEALS  . ELIQUIS 5 MG TABS tablet TAKE 1 TABLET BY MOUTH  TWICE DAILY  . gabapentin (NEURONTIN) 300 MG capsule Take 1 capsule (300 mg total) by mouth 3 (three) times daily.  Marland Kitchen levothyroxine (SYNTHROID, LEVOTHROID) 100 MCG tablet Take 1 tablet (100 mcg total) by mouth daily before breakfast.  . lidocaine-prilocaine (EMLA) cream Apply 1 application topically as needed.  . loperamide (IMODIUM) 2 MG capsule Take 1 capsule (2 mg total) by mouth See admin instructions. With onset of loose stool, take 4mg  followed by 2mg  every 2 hours until 12 hours have passed without loose bowel movement. Maximum: 16 mg/day  . loratadine (CLARITIN) 10 MG tablet Take 10 mg by mouth daily.  . Magnesium Cl-Calcium Carbonate (SLOW-MAG PO) Take 2 tablets by mouth daily.  Marland Kitchen nystatin (MYCOSTATIN) 100000 UNIT/ML suspension TAKE 5 MLS (500,000 UNITS  TOTAL) BY  MOUTH 4 (FOUR)  TIMES DAILY.  Marland Kitchen ondansetron (ZOFRAN) 4 MG tablet TAKE 1 TABLET BY MOUTH  EVERY 6 HOURS AS NEEDED FOR NAUSEA OR VOMITING.  . potassium chloride SA (K-DUR) 20 MEQ tablet TAKE 2 TABLETS BY MOUTH  DAILY  . prochlorperazine (COMPAZINE) 10 MG tablet TAKE 1 TABLET BY MOUTH  EVERY 6 HOURS AS NEEDED  . [DISCONTINUED] dexamethasone (DECADRON) 4 MG tablet TAKE 2 TABLETS BY MOUTH AS  DIRECTED. START THE DAY  AFTER CHEMO FOR 2 DAYS.  . [DISCONTINUED] omeprazole (PRILOSEC) 20 MG capsule TAKE 1 CAPSULE BY MOUTH  DAILY   No facility-administered encounter medications on file as of 02/02/2019.     Allergies (verified) Patient has no known allergies.   History: Past Medical History:  Diagnosis Date  . Anemia due to antineoplastic chemotherapy 11/25/2018  . Cancer ([place])   . GERD (gastroesophageal reflux disease)   . Hypertension   . Hypomagnesemia 11/25/2018  . Hypothyroidism   . Malignant neoplasm of head of pancreas ([place]) 06/08/2018   Past Surgical History:  Procedure Laterality Date  . CHOLECYSTECTOMY    . COLONOSCOPY N/A 12/02/2016   Procedure: COLONOSCOPY;  Surgeon: Lollie Sails, MD;  Location: [facility] ENDOSCOPY;  Service: Endoscopy;  Laterality: N/A;  . ERCP N/A 04/30/2018   Procedure: ENDOSCOPIC RETROGRADE CHOLANGIOPANCREATOGRAPHY (ERCP);  Surgeon: Lucilla Lame, MD;  Location: [facility] ENDOSCOPY;  Service: Endoscopy;  Laterality: N/A;  . ERCP N/A 05/05/2018   Procedure: ENDOSCOPIC RETROGRADE CHOLANGIOPANCREATOGRAPHY (  ERCP);  Surgeon: Lucilla Lame, MD;  Location: [facility] ENDOSCOPY;  Service: Endoscopy;  Laterality: N/A;  . PORTA CATH INSERTION N/A 06/28/2018   Procedure: PORTA CATH INSERTION;  Surgeon: Algernon Huxley, MD;  Location: [facility] LAB;  Service: Cardiovascular;  Laterality: N/A;   Family History  Problem Relation Age of Onset  . Diabetes Mellitus I Other   . Alcoholism Other   . Hypertension Other   . Hyperlipidemia Other   . Coronary artery disease Other   .  Stroke Other   . Osteoarthritis Other   . Migraines Other   . Heart Problems Mother   . Stroke Sister   . CAD Brother   . Stroke Brother   . Breast cancer Neg Hx    Social History   Socioeconomic History  . Marital status: Married    Spouse name: Not on file  . Number of children: Not on file  . Years of education: Not on file  . Highest education level: Not on file  Occupational History  . Not on file  Social Needs  . Financial resource strain: Not hard at all  . Food insecurity    Worry: Never true    Inability: Never true  . Transportation needs    Medical: No    Non-medical: No  Tobacco Use  . Smoking status: Former Smoker    Quit date: 04/14/1988    Years since quitting: 30.8  . Smokeless tobacco: Never Used  Substance and Sexual Activity  . Alcohol use: Yes    Comment: social drinker  . Drug use: No  . Sexual activity: Not Currently  Lifestyle  . Physical activity    Days per week: 5 days    Minutes per session: 30 min  . Stress: Not at all  Relationships  . Social Herbalist on phone: Not on file    Gets together: Not on file    Attends religious service: Not on file    Active member of club or organization: Not on file    Attends meetings of clubs or organizations: Not on file    Relationship status: Not on file  Other Topics Concern  . Not on file  Social History Narrative  . Not on file    Tobacco Counseling Counseling given: Not Answered   Clinical Intake:  Pre-visit preparation completed: Yes        Diabetes: No  How often do you need to have someone help you when you read instructions, pamphlets, or other written materials from your doctor or pharmacy?: 1 - Never  Interpreter Needed?: No      Activities of Daily Living In your present state of health, do you have any difficulty performing the following activities: 02/02/2019  Hearing? N  Vision? N  Difficulty concentrating or making decisions? Y  Comment  Chemotherapy has affected some areas of focusing; working through the healing process  Walking or climbing stairs? Y  Comment Unsteady gait  Dressing or bathing? N  Doing errands, shopping? Y  Comment She does not currently Physiological scientist and eating ? N  Comment Family assists with meal prep  Using the Toilet? N  In the past six months, have you accidently leaked urine? N  Do you have problems with loss of bowel control? N  Managing your Medications? Y  Comment Husband assists  Managing your Finances? Y  Comment Husband assists  Housekeeping or managing your Housekeeping? Y  Comment Husband assists  Some recent data might be hidden     Immunizations and Health Maintenance There is no immunization history for the selected administration types on file for this patient. There are no preventive care reminders to display for this patient.  Patient Care Team: Jodelle Green, FNP as PCP - General (Family Medicine) Clent Jacks, RN as Registered Nurse  Indicate any recent Medical Services you may have received from other than [facility] providers in the past year (date may be approximate).     Assessment:   This is a routine wellness examination for [facility].  I connected with patient 02/02/19 at  9:30 AM EDT by an audio enabled telemedicine application and verified that I am speaking with the correct person using two identifiers. Patient stated full name and DOB. Patient gave permission to continue with virtual visit. Patient's location was at home and Nurse's location was at [place] office.   Health Maintenance Due: Influenza vaccine and Tdap vaccine discontinued per patient preference.  See completed HM at the end of note.   Eye: Visual acuity not assessed. Virtual visit. Wears corrective lenses.   Dental: Dentures- yes  Hearing: Demonstrates normal hearing during visit.  Safety:  Patient feels safe at home- yes Patient does have smoke detectors at home- yes Patient  does wear sunscreen or protective clothing when in direct sunlight - yes Patient does wear seat belt when in a moving vehicle - yes Patient drives- not currently Adequate lighting in walkways free from debris- yes Grab bars and handrails used as appropriate- yes Ambulates with an assistive device when outside- walker Cell phone or lifeline/life alert/medic alert on person when ambulating outside of the home- yes  Social: Alcohol intake - yes; social     Smoking history- former   Smokers in home? none Illicit drug use? none  Depression: PHQ 2 &9 complete. See screening below. Denies irritability, anhedonia, sadness/tearfullness.  Stable.   Falls: See screening below.    Medication: Taking as directed and without issues.   Covid-19: Precautions and sickness symptoms discussed. Wears mask, social distancing, hand hygiene as appropriate.   Activities of Daily Living Patient denies needing assistance with: feeding themselves, getting from bed to chair, getting to the toilet, bathing/showering, dressing.  Assisted by husband and family with managing medication, money, meal prep and household chores.   Memory: Patient is alert. Patient denies difficulty focusing or concentrating. Correctly identified the president of the [place], season and recall.  BMI- discussed the importance of a healthy diet, water intake and the benefits of aerobic exercise.  Educational material provided.  Physical activity- Walks laps indoors with someone at side to assist in preventing falls; walker in use when outdoors.  PT exercises for feet. Limited with arm/hand exercises.   Diet: regular; appetite has improved Water: good intake Ensure/Protein supplement: no  Advanced Directive: End of life planning; Advanced aging; Advanced directives discussed.  No HCPOA/Living Will.  Additional information declined at this time.  Other Providers Patient Care Team: Jodelle Green, FNP as PCP - General (Family  Medicine) Clent Jacks, RN as Registered Nurse  Hearing/Vision screen  Hearing Screening   125Hz  250Hz  500Hz  1000Hz  2000Hz  3000Hz  4000Hz  6000Hz  8000Hz   Right ear:           Left ear:           Comments: Patient is able to hear conversational tones without difficulty.  No issues reported.   Vision Screening Comments: Wears corrective lenses Visual acuity not assessed, virtual visit.  They have seen their ophthalmologist in the last 12 months.     Dietary issues and exercise activities discussed: Current Exercise Habits: Home exercise routine, Type of exercise: walking;stretching(PT exercises), Intensity: Mild  Goals      Patient Stated   . Follow up with Provider as scheduled (pt-stated)      Depression Screen PHQ 2/9 Scores 02/02/2019 04/09/2018 11/19/2017 05/20/2017 05/20/2017  PHQ - 2 Score 0 0 0 0 0  PHQ- 9 Score - - - 0 0    Fall Risk Fall Risk  02/02/2019 07/23/2018 07/13/2018 04/09/2018 11/19/2017  Falls in the past year? 0 0 0 0 No    Timed Get Up and Go Performed no, virtual visit  Screening Tests Health Maintenance  Topic Date Due  . PNA vac Low Risk Adult (1 of 2 - PCV13) 04/10/2019 (Originally 02/25/2011)  . MAMMOGRAM  08/01/2019  . COLONOSCOPY  12/03/2026  . DEXA SCAN  Completed  . Hepatitis C Screening  Completed  . INFLUENZA VACCINE  Discontinued  . TETANUS/TDAP  Discontinued      Plan:    Keep all routine maintenance appointments.   Follow up 02/03/19  Medicare Attestation I have personally reviewed: The patient's medical and social history Their use of alcohol, tobacco or illicit drugs Their current medications and supplements The patient's functional ability including ADLs,fall risks, home safety risks, cognitive, and hearing and visual impairment Diet and physical activities Evidence for depression   In addition, I have reviewed and discussed with patient certain preventive protocols, quality metrics, and best practice recommendations. A  written personalized care plan for preventive services as well as general preventive health recommendations were provided to patient.     Varney Biles, LPN   [phone]

## 2019-02-03 ENCOUNTER — Ambulatory Visit (INDEPENDENT_AMBULATORY_CARE_PROVIDER_SITE_OTHER): Payer: Medicare Other

## 2019-02-03 ENCOUNTER — Ambulatory Visit (INDEPENDENT_AMBULATORY_CARE_PROVIDER_SITE_OTHER): Payer: Medicare Other | Admitting: Family Medicine

## 2019-02-03 ENCOUNTER — Encounter: Payer: Self-pay | Admitting: Family Medicine

## 2019-02-03 ENCOUNTER — Other Ambulatory Visit: Payer: Self-pay

## 2019-02-03 VITALS — BP 112/62 | HR 96 | Temp 98.5°F | Wt 129.8 lb

## 2019-02-03 DIAGNOSIS — E039 Hypothyroidism, unspecified: Secondary | ICD-10-CM | POA: Diagnosis not present

## 2019-02-03 DIAGNOSIS — G629 Polyneuropathy, unspecified: Secondary | ICD-10-CM

## 2019-02-03 DIAGNOSIS — M25511 Pain in right shoulder: Secondary | ICD-10-CM

## 2019-02-03 DIAGNOSIS — G8929 Other chronic pain: Secondary | ICD-10-CM

## 2019-02-03 MED ORDER — GABAPENTIN 400 MG PO CAPS: 400.0000 mg | ORAL_CAPSULE | Freq: Three times a day (TID) | ORAL | 1 refills | Status: DC

## 2019-02-03 MED ORDER — ACETAMINOPHEN-CODEINE #3 300-30 MG PO TABS: 1.0000 | ORAL_TABLET | ORAL | 0 refills | Status: DC | PRN

## 2019-02-03 NOTE — Progress Notes (Signed)
Subjective:    Patient ID: Lynn Taylor, female    DOB: 07-Jan-1946, 73 y.o.   MRN: [id number]  HPI Presents to clinic c/o pain in right upper arm/right shoulder and neuropathy pain in feet and fingers.  She currently takes gabapentin 300 mg TID for her neuropathy, but it seems worse since her chemo treatments.  Also has had pain in right shoulder off and on for years, thinks it is related to arthritis, worse over the past couple of months.  Has been doing physical therapy for strengthening throughout her chemotherapy, but some days she feels a lot more stiff than others. Tries to do stretches at home every day to keep self moving.   No fever or chills. No fall. No new injury.    Patient Active Problem List   Diagnosis Date Noted  . DVT of lower extremity, bilateral ([place]) 11/25/2018  . Anemia due to antineoplastic chemotherapy 11/25/2018  . Hypokalemia 11/25/2018  . Hypomagnesemia 11/25/2018  . Decreased appetite 09/20/2018  . Genetic testing 08/31/2018  . Malignant neoplasm of head of pancreas ([place]) 06/08/2018  . Moderate protein-calorie malnutrition ([place]) 06/06/2018  . Goals of care, counseling/discussion 05/08/2018  . Fitting and adjustment of gastrointestinal appliance and device   . Hyponatremia 05/04/2018  . Jaundice   . Pancreas neoplasm   . Biliary tract obstruction   . Hyperbilirubinemia 04/28/2018  . Chest pain 04/11/2018  . GERD (gastroesophageal reflux disease) 04/11/2018  . HTN (hypertension), benign 05/20/2017  . Acquired hypothyroidism 05/20/2017  . Vitamin D deficiency, unspecified 05/20/2017   Social History   Tobacco Use  . Smoking status: Former Smoker    Quit date: 04/14/1988    Years since quitting: 30.8  . Smokeless tobacco: Never Used  Substance Use Topics  . Alcohol use: Yes    Comment: social drinker    Review of Systems  Constitutional: Negative for chills, fatigue and fever.  HENT: Negative for congestion, ear pain, sinus pain and sore  throat.   Eyes: Negative.   Respiratory: Negative for cough, shortness of breath and wheezing.   Cardiovascular: Negative for chest pain, palpitations and leg swelling.  Gastrointestinal: Negative for abdominal pain, diarrhea, nausea and vomiting.  Genitourinary: Negative for dysuria, frequency and urgency.  Musculoskeletal: +right arm pain Skin: Negative for color change, pallor and rash.  Neurological: Negative for syncope, light-headedness and headaches. +neuropathy pain in feet/fingers Psychiatric/Behavioral: The patient is not nervous/anxious.       Objective:   Physical Exam Vitals signs and nursing note reviewed.  Constitutional:      General: She is not in acute distress.    Comments: Appears thin, has had weight loss related to chemo treatments  HENT:     Head: Normocephalic and atraumatic.  Eyes:     General: No scleral icterus.    Extraocular Movements: Extraocular movements intact.     Pupils: Pupils are equal, round, and reactive to light.  Neck:     Musculoskeletal: Normal range of motion and neck supple. No neck rigidity or muscular tenderness.  Cardiovascular:     Rate and Rhythm: Normal rate and regular rhythm.     Heart sounds: Normal heart sounds.  Pulmonary:     Effort: Pulmonary effort is normal. No respiratory distress.     Breath sounds: Normal breath sounds.  Musculoskeletal:     Right shoulder: She exhibits decreased range of motion and tenderness.     Comments: Tenderness with palpation of AC joint. Pain with lifting arm up  above 90 degree angle mark.   Movement of fingers is stiff. Able to feel me touching all fingers and able to feel me touching bottoms of feet with my fingers. More difficulty sensing pin on bottoms of feet.  Skin:    General: Skin is warm and dry.  Neurological:     Mental Status: She is alert and oriented to person, place, and time.  Psychiatric:        Mood and Affect: Mood normal.        Behavior: Behavior normal.     Today's Vitals   02/03/19 0812  BP: 112/62  Pulse: 96  Temp: 98.5 F (36.9 C)  TempSrc: Temporal  SpO2: 98%  Weight: 129 lb 12.8 oz (58.9 kg)   Body mass index is 23.74 kg/m.     Assessment & Plan:    A total of 25 minutes were spent face-to-face with the patient during this encounter and over half of that time was spent on counseling and coordination of care. The patient was counseled on causes of neuropathy, ways to treat, plan for xray.    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  We will try increasing dose from 300 mg gabapentin 3 times daily to 400 mg gabapentin 3 times daily to see if this offers better relief.  Long discussion with patient regards to the fact that the neuropathy damage it may never come back and at this point our goal is to try and manage the pain as best as possible.  Encouraged to check hands and feet every day to be sure to watch out for any sort of cuts or open areas in skin.  Right shoulder pain-we will get x-ray to further investigate.  Suspect it is arthritis related. Will send in some tylenol #3 to use PRN pain.   Patient will continue physical therapy to help overall strengthening  We will also get TSH level to follow-up on hypothyroidism, she would prefer to get this drawn at the cancer center through her port rather than being drawn peripherally here.  Declines flu shot  Patient will follow-up approximately every 3 months for recheck on chronic conditions and she is aware she can return to clinic anytime issues arise.

## 2019-02-03 NOTE — Patient Instructions (Signed)
Increase Gabapentin to 400 mg, three times per day. New Rx sent in

## 2019-02-04 ENCOUNTER — Other Ambulatory Visit: Payer: Self-pay | Admitting: Lab

## 2019-02-07 ENCOUNTER — Other Ambulatory Visit: Payer: Self-pay | Admitting: Family Medicine

## 2019-02-07 DIAGNOSIS — G8929 Other chronic pain: Secondary | ICD-10-CM

## 2019-02-07 NOTE — Progress Notes (Addendum)
Reviewed note and agree  LGuse FNP

## 2019-02-08 ENCOUNTER — Other Ambulatory Visit: Payer: Self-pay

## 2019-02-09 ENCOUNTER — Other Ambulatory Visit: Payer: Self-pay

## 2019-02-09 ENCOUNTER — Inpatient Hospital Stay: Payer: Medicare Other | Attending: Oncology

## 2019-02-09 DIAGNOSIS — E039 Hypothyroidism, unspecified: Secondary | ICD-10-CM | POA: Diagnosis not present

## 2019-02-09 DIAGNOSIS — Z86718 Personal history of other venous thrombosis and embolism: Secondary | ICD-10-CM | POA: Diagnosis not present

## 2019-02-09 DIAGNOSIS — Z87891 Personal history of nicotine dependence: Secondary | ICD-10-CM | POA: Insufficient documentation

## 2019-02-09 DIAGNOSIS — Z452 Encounter for adjustment and management of vascular access device: Secondary | ICD-10-CM | POA: Insufficient documentation

## 2019-02-09 DIAGNOSIS — Z79899 Other long term (current) drug therapy: Secondary | ICD-10-CM | POA: Diagnosis not present

## 2019-02-09 DIAGNOSIS — G620 Drug-induced polyneuropathy: Secondary | ICD-10-CM | POA: Insufficient documentation

## 2019-02-09 DIAGNOSIS — E876 Hypokalemia: Secondary | ICD-10-CM | POA: Insufficient documentation

## 2019-02-09 DIAGNOSIS — T451X5A Adverse effect of antineoplastic and immunosuppressive drugs, initial encounter: Secondary | ICD-10-CM | POA: Diagnosis not present

## 2019-02-09 DIAGNOSIS — Z95828 Presence of other vascular implants and grafts: Secondary | ICD-10-CM | POA: Insufficient documentation

## 2019-02-09 DIAGNOSIS — Z7901 Long term (current) use of anticoagulants: Secondary | ICD-10-CM | POA: Diagnosis not present

## 2019-02-09 DIAGNOSIS — K219 Gastro-esophageal reflux disease without esophagitis: Secondary | ICD-10-CM | POA: Diagnosis not present

## 2019-02-09 DIAGNOSIS — R5383 Other fatigue: Secondary | ICD-10-CM | POA: Insufficient documentation

## 2019-02-09 DIAGNOSIS — C250 Malignant neoplasm of head of pancreas: Secondary | ICD-10-CM | POA: Insufficient documentation

## 2019-02-09 DIAGNOSIS — I10 Essential (primary) hypertension: Secondary | ICD-10-CM | POA: Diagnosis not present

## 2019-02-09 DIAGNOSIS — D6481 Anemia due to antineoplastic chemotherapy: Secondary | ICD-10-CM | POA: Insufficient documentation

## 2019-02-09 DIAGNOSIS — Z9221 Personal history of antineoplastic chemotherapy: Secondary | ICD-10-CM | POA: Insufficient documentation

## 2019-02-09 MED ORDER — SODIUM CHLORIDE 0.9% FLUSH
10.0000 mL | Freq: Once | INTRAVENOUS | Status: AC
  Administered 2019-02-09: 10 mL via INTRAVENOUS
  Filled 2019-02-09: qty 10

## 2019-02-09 MED ORDER — HEPARIN SOD (PORK) LOCK FLUSH 100 UNIT/ML IV SOLN
500.0000 [IU] | Freq: Once | INTRAVENOUS | Status: AC
  Administered 2019-02-09: 500 [IU] via INTRAVENOUS

## 2019-02-17 ENCOUNTER — Other Ambulatory Visit: Payer: Self-pay | Admitting: Oncology

## 2019-02-18 NOTE — Telephone Encounter (Signed)
...    Ref Range & Units 64mo ago (12/29/18) 41mo ago (12/15/18) 80mo ago (12/01/18)  Potassium 3.5 - 5.1 mmol/L 3.6  3.8  3.9

## 2019-02-21 ENCOUNTER — Other Ambulatory Visit: Payer: Self-pay | Admitting: Sports Medicine

## 2019-02-21 DIAGNOSIS — M62542 Muscle wasting and atrophy, not elsewhere classified, left hand: Secondary | ICD-10-CM

## 2019-02-21 DIAGNOSIS — M62541 Muscle wasting and atrophy, not elsewhere classified, right hand: Secondary | ICD-10-CM

## 2019-02-21 DIAGNOSIS — R202 Paresthesia of skin: Secondary | ICD-10-CM

## 2019-02-21 DIAGNOSIS — R200 Anesthesia of skin: Secondary | ICD-10-CM

## 2019-02-21 DIAGNOSIS — R269 Unspecified abnormalities of gait and mobility: Secondary | ICD-10-CM

## 2019-02-23 ENCOUNTER — Other Ambulatory Visit: Payer: Self-pay

## 2019-02-23 ENCOUNTER — Encounter: Payer: Self-pay | Admitting: Oncology

## 2019-02-23 NOTE — Progress Notes (Signed)
Patient stated that she had been doing well with no complaints. 

## 2019-02-24 ENCOUNTER — Inpatient Hospital Stay: Payer: Medicare Other

## 2019-02-24 ENCOUNTER — Other Ambulatory Visit: Payer: Self-pay

## 2019-02-24 ENCOUNTER — Inpatient Hospital Stay: Payer: Medicare Other | Admitting: *Deleted

## 2019-02-24 ENCOUNTER — Encounter: Payer: Self-pay | Admitting: *Deleted

## 2019-02-24 ENCOUNTER — Inpatient Hospital Stay (HOSPITAL_BASED_OUTPATIENT_CLINIC_OR_DEPARTMENT_OTHER): Payer: Medicare Other | Admitting: Oncology

## 2019-02-24 VITALS — BP 120/66 | HR 77 | Temp 96.9°F | Resp 16 | Ht 63.0 in | Wt 136.3 lb

## 2019-02-24 DIAGNOSIS — T451X5A Adverse effect of antineoplastic and immunosuppressive drugs, initial encounter: Secondary | ICD-10-CM | POA: Diagnosis not present

## 2019-02-24 DIAGNOSIS — E876 Hypokalemia: Secondary | ICD-10-CM

## 2019-02-24 DIAGNOSIS — Z86718 Personal history of other venous thrombosis and embolism: Secondary | ICD-10-CM

## 2019-02-24 DIAGNOSIS — C250 Malignant neoplasm of head of pancreas: Secondary | ICD-10-CM

## 2019-02-24 DIAGNOSIS — Z95828 Presence of other vascular implants and grafts: Secondary | ICD-10-CM

## 2019-02-24 DIAGNOSIS — D6481 Anemia due to antineoplastic chemotherapy: Secondary | ICD-10-CM | POA: Diagnosis not present

## 2019-02-24 LAB — COMPREHENSIVE METABOLIC PANEL
ALT: 37 U/L (ref 0–44)
AST: 38 U/L (ref 15–41)
Albumin: 3.6 g/dL (ref 3.5–5.0)
Alkaline Phosphatase: 129 U/L — ABNORMAL HIGH (ref 38–126)
Anion gap: 5 (ref 5–15)
BUN: 15 mg/dL (ref 8–23)
CO2: 24 mmol/L (ref 22–32)
Calcium: 9.6 mg/dL (ref 8.9–10.3)
Chloride: 109 mmol/L (ref 98–111)
Creatinine, Ser: 0.53 mg/dL (ref 0.44–1.00)
GFR calc Af Amer: >60 mL/min (ref >60)
GFR calc non Af Amer: >60 mL/min (ref >60)
Glucose, Bld: 106 mg/dL — ABNORMAL HIGH (ref 70–99)
Potassium: 3.4 mmol/L — ABNORMAL LOW (ref 3.5–5.1)
Sodium: 138 mmol/L (ref 135–145)
Total Bilirubin: 0.3 mg/dL (ref 0.3–1.2)
Total Protein: 6.8 g/dL (ref 6.5–8.1)

## 2019-02-24 LAB — CBC WITH DIFFERENTIAL/PLATELET
Abs Immature Granulocytes: 0.02 10*3/uL (ref 0.00–0.07)
Basophils Absolute: 0 10*3/uL (ref 0.0–0.1)
Basophils Relative: 0 %
Eosinophils Absolute: 0.1 10*3/uL (ref 0.0–0.5)
Eosinophils Relative: 1 %
HCT: 30 % — ABNORMAL LOW (ref 36.0–46.0)
Hemoglobin: 9.6 g/dL — ABNORMAL LOW (ref 12.0–15.0)
Immature Granulocytes: 0 %
Lymphocytes Relative: 29 %
Lymphs Abs: 1.8 10*3/uL (ref 0.7–4.0)
MCH: 28.2 pg (ref 26.0–34.0)
MCHC: 32 g/dL (ref 30.0–36.0)
MCV: 88 fL (ref 80.0–100.0)
Monocytes Absolute: 0.4 10*3/uL (ref 0.1–1.0)
Monocytes Relative: 6 %
Neutro Abs: 4 10*3/uL (ref 1.7–7.7)
Neutrophils Relative %: 64 %
Platelets: 179 10*3/uL (ref 150–400)
RBC: 3.41 MIL/uL — ABNORMAL LOW (ref 3.87–5.11)
RDW: 14.9 % (ref 11.5–15.5)
WBC: 6.3 10*3/uL (ref 4.0–10.5)
nRBC: 0 % (ref 0.0–0.2)

## 2019-02-24 MED ORDER — SODIUM CHLORIDE 0.9% FLUSH
10.0000 mL | Freq: Once | INTRAVENOUS | Status: AC
  Administered 2019-02-24: 10 mL via INTRAVENOUS
  Filled 2019-02-24: qty 10

## 2019-02-24 MED ORDER — POTASSIUM CHLORIDE ER 10 MEQ PO TBCR: 10.0000 meq | EXTENDED_RELEASE_TABLET | Freq: Every day | ORAL | 0 refills | Status: DC

## 2019-02-24 MED ORDER — APIXABAN 5 MG PO TABS: 5.0000 mg | ORAL_TABLET | Freq: Two times a day (BID) | ORAL | 0 refills | Status: DC

## 2019-02-24 MED ORDER — HEPARIN SOD (PORK) LOCK FLUSH 100 UNIT/ML IV SOLN
500.0000 [IU] | Freq: Once | INTRAVENOUS | Status: AC
  Administered 2019-02-24: 500 [IU] via INTRAVENOUS
  Filled 2019-02-24: qty 5

## 2019-02-24 NOTE — Progress Notes (Signed)
No treatment today per Duwayne Heck CMA per Dr. Tasia Catchings. Pt stable at discharge.

## 2019-02-24 NOTE — Progress Notes (Signed)
Faxed approval to [place] for acupuncture for chemotherapy induced neuropathy.

## 2019-02-25 ENCOUNTER — Telehealth: Payer: Self-pay | Admitting: Pharmacy Technician

## 2019-02-25 LAB — CANCER ANTIGEN 19-9: CA 19-9: 21 U/mL (ref 0–35)

## 2019-02-25 NOTE — Addendum Note (Signed)
Addended by: Earlie Server on: 02/25/2019 08:24 AM   Modules accepted: Orders

## 2019-02-25 NOTE — Progress Notes (Signed)
Hematology/Oncology Follow Up Note [facility]  Telephone:([phone] [phone]  Patient Care Team: Jodelle Green, FNP as PCP - General (Family Medicine) Clent Jacks, RN as Registered Nurse   Name of the patient: Lynn Taylor  OR:[id number]  April 06, 1946   REASON FOR VISIT  follow-up for adjuvant chemotherapy for pancreatic cancer.  HISTORY OF PRESENTING ILLNESS:  Lynn Taylor is a  73 y.o.  female with PMH listed below who was referred to me for evaluation of evaluation of elevated ferritin. Patient recently had lab work-up done on 04/09/2018 which showed ferritin level 1877, iron 97, TIBC 340, iron saturation 29, 04/11/2018 CBC showed hemoglobin 13.2, MCV 81, WBC 7.6, platelet counts 246,000. Patient was referred to hematology for further evaluation of elevated ferritin.  #04/28/2018 Ultrasound of liver was obtained which showed CBD and intrahepatic biliary dilatation Patient wa she is s advised to go to emergency room for further evaluation. Also had hyperbilirubinemia.  Patient was complaining about being jaundiced, pruritus all over. MR abdomen MRCP with and without contrast showed market biliary duct dilatation, secondary to obstruction in the region of pancreatic head.  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 No abdominal adenopathy, liver metastasis or cross vascular involvement.  patient was evaluated by gastroenterology and status post ERCP with stenting. CA 19.9 1173 CEA 3.9 # 05/25/2018  patient was referred to [facility] and s/p whipple resection.Her postop course was c/b Type A pancreatic leak and c.diff. Pathology showed A. Hepatic artery lymph node, excision: One lymph node, negative for malignancy (0/1). B. Biliary stent, removal: Medical device, consistent with stent, gross examination only. C. Head of pancreas, duodenum, portion of stomach, pancreaticoduodenectomy (Whipple):  Pancreatic ductal adenocarcinoma, poorly differentiated, 3.2 cm, uncinate, confined to pancreas. All margins are negative (closest margin=uncinate, 0.5 mm) Metastatic adenocarcinoma in one of thirty-two lymph nodes (1/32).  Portion of stomach and duodenum with no specific pathologic diagnosis.   Grade, 3 poorly differentiated.  Pathologic pT2 pN1  Cancer Treatment 07/13/2018 Cycle 1 FOLFIRINOX with Onpro Neulasta.  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 10/06/2018 cycle 6  mFOLFIRINOX with Onpro Neulasta - [50% dose for Irinotecan- 5-FU bolus omitted]. 10/20/2018 cycle 7 mFOLFIRINOX with Onpro Neulasta - [50% dose for Irinotecan- 5-FU bolus omitted].  INTERVAL HISTORY Lynn Taylor is a 73 y.o. female with history of stage IIb pancreatic cancer presents to for follow-up.   Patient reports feeling well today.  Denies any pain. Appetite is better.  She has gained 5 pounds since her visit in July 2020. Continue to have bilateral lower extremity neuropathies. Swelling of the lower extremity has significantly improved. Patient takes Eliquis 5 mg twice daily for DVT treatments.  Patient takes Creon 36,000 units 3 times a day with meals.  Recently due to high co-pay, she is not able to afford the medication.  Denies any diarrhea, nausea, vomiting, chest pain, fever, chills, unintentional weight loss, abdominal pain.  Reports right upper extremity/shoulder pain, due to arthritis, patient was referred to see  orthopedic surgeon on 02/17/2019 and was  given steroid injection.  Shoulder pain has improved.    Review of Systems  Constitutional: Positive for fatigue. Negative for appetite change, chills, fever and unexpected weight change.  HENT:   Negative for hearing loss and voice change.   Eyes: Negative for eye problems.  Respiratory: Negative for chest tightness, cough and shortness of breath.   Cardiovascular: Negative for chest pain and leg swelling.  Gastrointestinal: Negative for abdominal distention, abdominal pain, blood in stool and diarrhea.  Endocrine: Negative for hot flashes.  Genitourinary: Negative for difficulty urinating and frequency.   Musculoskeletal: Negative for arthralgias.  Skin: Negative for itching and rash.  Neurological: Negative for extremity weakness.  Hematological: Negative for adenopathy.  Psychiatric/Behavioral: Negative for confusion.      No Known Allergies   Past Medical History:  Diagnosis Date  . Anemia due to antineoplastic chemotherapy 11/25/2018  . Cancer ([place])   . GERD (gastroesophageal reflux disease)   . Hypertension   . Hypomagnesemia 11/25/2018  . Hypothyroidism   . Malignant neoplasm of head of pancreas ([place]) 06/08/2018     Past Surgical History:  Procedure Laterality Date  . CHOLECYSTECTOMY    . COLONOSCOPY N/A 12/02/2016   Procedure: COLONOSCOPY;  Surgeon: Lollie Sails, MD;  Location[facility] ENDOSCOPY;  Service: Endoscopy;  Laterality: N/A;  . ERCP N/A 04/30/2018   Procedure: ENDOSCOPIC RETROGRADE CHOLANGIOPANCREATOGRAPHY (ERCP);  Surgeon: Lucilla Lame, MD;  Location: [facility] ENDOSCOPY;  Service: Endoscopy;  Laterality: N/A;  . ERCP N/A 05/05/2018   Procedure: ENDOSCOPIC RETROGRADE CHOLANGIOPANCREATOGRAPHY (ERCP);  Surgeon: Lucilla Lame, MD;  Location: [facility] ENDOSCOPY;  Service: Endoscopy;  Laterality: N/A;  . PORTA CATH INSERTION N/A 06/28/2018   Procedure: PORTA CATH INSERTION;  Surgeon: Algernon Huxley, MD;  Location: [place] CV  LAB;  Service: Cardiovascular;  Laterality: N/A;    Social History   Socioeconomic History  . Marital status: Married    Spouse name: Not on file  . Number of children: Not on file  . Years of education: Not on file  . Highest education level: Not on file  Occupational History  . Not on file  Social Needs  . Financial resource strain: Not hard at all  . Food insecurity    Worry: Never true    Inability: Never true  . Transportation needs    Medical: No    Non-medical: No  Tobacco Use  . Smoking status: Former Smoker    Quit date: 04/14/1988    Years since quitting: 30.8  . Smokeless tobacco: Never Used  Substance and Sexual Activity  . Alcohol use: Yes    Comment: social drinker  . Drug use: No  . Sexual activity: Not Currently  Lifestyle  . Physical activity    Days per week: 5 days    Minutes per session: 30 min  . Stress: Not at all  Relationships  . Social Herbalist on phone: Not on file    Gets together: Not on file    Attends religious service: Not on file    Active member of club or organization: Not on file    Attends meetings of clubs or organizations: Not on file    Relationship status: Not on file  . Intimate partner violence    Fear of current or ex partner: No    Emotionally abused: No    Physically abused: No    Forced sexual activity: No  Other Topics Concern  . Not on file  Social History Narrative  .  Not on file    Family History  Problem Relation Age of Onset  . Diabetes Mellitus I Other   . Alcoholism Other   . Hypertension Other   . Hyperlipidemia Other   . Coronary artery disease Other   . Stroke Other   . Osteoarthritis Other   . Migraines Other   . Heart Problems Mother   . Stroke Sister   . CAD Brother   . Stroke Brother   . Breast cancer Neg Hx      Current Outpatient Medications:  .  acetaminophen-codeine (TYLENOL #3) 300-30 MG tablet, Take 1 tablet by mouth every 4 (four) hours as needed for moderate pain.,  Disp: 20 tablet, Rfl: 0 .  apixaban (ELIQUIS) 5 MG TABS tablet, Take 1 tablet (5 mg total) by mouth 2 (two) times daily., Disp: 180 tablet, Rfl: 0 .  chlorhexidine (PERIDEX) 0.12 % solution, Use as directed 15 mLs in the mouth or throat 2 (two) times daily., Disp: 473 mL, Rfl: 3 .  Cholecalciferol (VITAMIN D-3) 25 MCG (1000 UT) CAPS, Take by mouth., Disp: , Rfl:  .  CREON 36000 units CPEP capsule, TAKE 1 CAPSULE BY MOUTH 3  TIMES DAILY WITH MEALS, Disp: 200 capsule, Rfl: 3 .  gabapentin (NEURONTIN) 400 MG capsule, Take 1 capsule (400 mg total) by mouth 3 (three) times daily., Disp: 270 capsule, Rfl: 1 .  levothyroxine (SYNTHROID, LEVOTHROID) 100 MCG tablet, Take 1 tablet (100 mcg total) by mouth daily before breakfast., Disp: 90 tablet, Rfl: 4 .  lidocaine-prilocaine (EMLA) cream, Apply 1 application topically as needed., Disp: 30 g, Rfl: 3 .  loperamide (IMODIUM) 2 MG capsule, Take 1 capsule (2 mg total) by mouth See admin instructions. With onset of loose stool, take 4mg  followed by 2mg  every 2 hours until 12 hours have passed without loose bowel movement. Maximum: 16 mg/day, Disp: 120 capsule, Rfl: 1 .  loratadine (CLARITIN) 10 MG tablet, Take 10 mg by mouth daily., Disp: , Rfl:  .  Magnesium Cl-Calcium Carbonate (SLOW-MAG PO), Take 2 tablets by mouth daily., Disp: , Rfl:  .  nystatin (MYCOSTATIN) 100000 UNIT/ML suspension, TAKE 5 MLS (500,000 UNITS  TOTAL) BY MOUTH 4 (FOUR)  TIMES DAILY., Disp: 946 mL, Rfl: 0 .  omeprazole (PRILOSEC) 20 MG capsule, Take 1 capsule by mouth 1 day or 1 dose., Disp: , Rfl:  .  ondansetron (ZOFRAN) 4 MG tablet, TAKE 1 TABLET BY MOUTH  EVERY 6 HOURS AS NEEDED FOR NAUSEA OR VOMITING. (Patient not taking: Reported on 02/23/2019), Disp: 180 tablet, Rfl: 1 .  potassium chloride (K-DUR) 10 MEQ tablet, Take 1 tablet (10 mEq total) by mouth daily., Disp: 90 tablet, Rfl: 0 .  prochlorperazine (COMPAZINE) 10 MG tablet, TAKE 1 TABLET BY MOUTH  EVERY 6 HOURS AS NEEDED (Patient not  taking: Reported on 02/23/2019), Disp: 90 tablet, Rfl: 0  Physical exam: ECOG 2 Vitals:   02/24/19 0905  BP: 120/66  Pulse: 77  Resp: 16  Temp: (!) 96.9 F (36.1 C)  TempSrc: Tympanic  Weight: 136 lb 4.8 oz (61.8 kg)  Height: 5\' 3"  (1.6 m)   Physical Exam Constitutional:      General: She is not in acute distress.    Appearance: She is not ill-appearing.  HENT:     Head: Normocephalic and atraumatic.  Eyes:     General: No scleral icterus.    Pupils: Pupils are equal, round, and reactive to light.  Neck:     Musculoskeletal: Normal range  of motion and neck supple.  Cardiovascular:     Rate and Rhythm: Normal rate and regular rhythm.     Heart sounds: Normal heart sounds.  Pulmonary:     Effort: Pulmonary effort is normal. No respiratory distress.     Breath sounds: No wheezing.  Abdominal:     General: Bowel sounds are normal. There is no distension.     Palpations: Abdomen is soft. There is no mass.     Tenderness: There is no abdominal tenderness.  Musculoskeletal: Normal range of motion.        General: No swelling or deformity.  Skin:    General: Skin is warm and dry.     Findings: No erythema or rash.  Neurological:     Mental Status: She is alert and oriented to person, place, and time.     Cranial Nerves: No cranial nerve deficit.     Coordination: Coordination normal.  Psychiatric:        Behavior: Behavior normal.        Thought Content: Thought content normal.     CMP Latest Ref Rng & Units 02/24/2019  Glucose 70 - 99 mg/dL 106(H)  BUN 8 - 23 mg/dL 15  Creatinine 0.44 - 1.00 mg/dL 0.53  Sodium 135 - 145 mmol/L 138  Potassium 3.5 - 5.1 mmol/L 3.4(L)  Chloride 98 - 111 mmol/L 109  CO2 22 - 32 mmol/L 24  Calcium 8.9 - 10.3 mg/dL 9.6  Total Protein 6.5 - 8.1 g/dL 6.8  Total Bilirubin 0.3 - 1.2 mg/dL 0.3  Alkaline Phos 38 - 126 U/L 129(H)  AST 15 - 41 U/L 38  ALT 0 - 44 U/L 37   CBC Latest Ref Rng & Units 02/24/2019  WBC 4.0 - 10.5 K/uL 6.3   Hemoglobin 12.0 - 15.0 g/dL 9.6(L)  Hematocrit 36.0 - 46.0 % 30.0(L)  Platelets 150 - 400 K/uL 179   RADIOGRAPHIC STUDIES: I have personally reviewed the radiological images as listed and agreed with the findings in the report.  Dg Shoulder Right  Result Date: 02/03/2019 CLINICAL DATA:  Right shoulder pain for the past 3 months. History of pancreatic cancer. EXAM: RIGHT SHOULDER - 2+ VIEW COMPARISON:  None. FINDINGS: No fracture or dislocation. Mild degenerative change of the glenohumeral joint with joint space loss, subchondral sclerosis and inferiorly directed osteophytosis. Acromioclavicular joint spaces appear preserved. Minimal enthesopathic change involving the rotator cuff insertion site. No evidence of calcific tendinitis. Regional soft tissues appear normal. Limited visualization of the adjacent thorax demonstrates a right jugular approach port a catheter with tip projected over the mid/distal SVC. No definite aggressive osseous abnormalities. IMPRESSION: Mild degenerative change of the right glenohumeral joint. Electronically Signed   By: Sandi Mariscal M.D.   On: 02/03/2019 11:01     Assessment and plan Patient is a 72 y.o. female history of stage IIb pancreatic cancer present for discussion of image results and management of newly diagnosed bilateral lower extremity DVT. 1. Malignant neoplasm of head of pancreas ([place])   2. Port-A-Cath in place   3. History of deep vein thrombosis (DVT) of lower extremity   4. Anemia due to antineoplastic chemotherapy   5. Hypokalemia    #Stage IIB pancreatic cancer status post surgical resection. Previously received 7 out of the 12 recommended cycles of mFOLFIRINOX with  Difficulties and have decided not to proceed with additional adjuvant chemotherapy. 01/03/2019 CT chest abdomen pelvis with contrast with [facility]IPS at [facility] showed approximately 1.3 cm ill-defined hypodense region  centrally within the liver that was not present in January 2020.  Small area  of hypodensities within the right common femoral vein consistent with DVT. Patient had MRI abdomen with and without contrast on 01/19/2019 at [facility] MRI showed previously seen lesion on CT corresponds to focal fatty infiltration.  No convincing evidence of metastatic disease.  Recommend repeat CT chest abdomen pelvis with contrast in 3 months.  #Anemia, hemoglobin slowly recovering.  Hemoglobin 9.6 today. #Hypokalemia, patient ran out of potassium tablets.  Today's potassium level is 3.4. Commend patient to take potassium chloride 10 mEq daily. New prescription sent to pharmacy.  #Weight loss, has improved.  She has gained weight.  Continue follow-up with dietitian. Continue Creon.  Looking to drug assistant program.  #History of bilateral lower extremity DVT, tolerates Eliquis 5 mg twice daily.  Continue.  In the future may increase to Eliquis 2.5 mg twice daily as maintenance. 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  Continue gabapentin 400 mg 3 times daily. #Discussed about keeping Mediport for 1 to 2 years.  Port flush every 6 to 8 weeks.  She agrees with the plan.  Follow-up pain 3 months.  Earlie Server, MD, PhD Hematology Oncology [place] at [facility] 02/25/2019

## 2019-02-25 NOTE — Telephone Encounter (Signed)
Oral Oncology Patient Advocate Encounter  Mailed application for [employer] Patient Assistance Program for Creon to patient.    Application is partially completed.  Patient has been instructed to complete missing information and obtain a pharmacy statement from [place].  I attached my business card to the application for the patient to reach me with any questions.  [employer] patient assistance phone number for follow up is [phone].  Fax: [phone]. Application states that they will notify the patient and prescriber about eligibility.  This encounter will be updated until final determination.   [place] Patient [place] Phone [phone] Fax [phone] 02/25/2019 1:57 PM

## 2019-03-01 ENCOUNTER — Other Ambulatory Visit: Payer: Self-pay

## 2019-03-01 ENCOUNTER — Other Ambulatory Visit: Payer: Medicare Other

## 2019-03-01 ENCOUNTER — Encounter: Payer: Self-pay | Admitting: Oncology

## 2019-03-01 ENCOUNTER — Telehealth: Payer: Self-pay | Admitting: *Deleted

## 2019-03-01 ENCOUNTER — Ambulatory Visit: Payer: Medicare Other | Admitting: Oncology

## 2019-03-01 MED ORDER — PANCRELIPASE (LIP-PROT-AMYL) 36000-114000 UNITS PO CPEP: 36000.0000 [IU] | ORAL_CAPSULE | Freq: Three times a day (TID) | ORAL | 0 refills | Status: DC

## 2019-03-01 NOTE — Telephone Encounter (Signed)
Patient called asking if we have found anything out about her Creon and whether we have any samples. Please return her call [phone]

## 2019-03-01 NOTE — Telephone Encounter (Signed)
Patient is getting a 30 day supply at local pharmacy.  She is going to fill out a patient assistance application for Creon.

## 2019-03-01 NOTE — Telephone Encounter (Signed)
I put the application for assistance in the mail basket on Friday.  She should receive it this week.

## 2019-03-04 ENCOUNTER — Ambulatory Visit
Admission: RE | Admit: 2019-03-04 | Discharge: 2019-03-04 | Disposition: A | Discharge status: Home / Self Care | Payer: Medicare Other | Source: Ambulatory Visit | Attending: Sports Medicine | Admitting: Sports Medicine

## 2019-03-04 ENCOUNTER — Other Ambulatory Visit: Payer: Self-pay

## 2019-03-04 DIAGNOSIS — R202 Paresthesia of skin: Secondary | ICD-10-CM | POA: Insufficient documentation

## 2019-03-04 DIAGNOSIS — M62541 Muscle wasting and atrophy, not elsewhere classified, right hand: Secondary | ICD-10-CM | POA: Diagnosis present

## 2019-03-04 DIAGNOSIS — R269 Unspecified abnormalities of gait and mobility: Secondary | ICD-10-CM | POA: Insufficient documentation

## 2019-03-04 DIAGNOSIS — R200 Anesthesia of skin: Secondary | ICD-10-CM | POA: Diagnosis present

## 2019-03-04 DIAGNOSIS — M62542 Muscle wasting and atrophy, not elsewhere classified, left hand: Secondary | ICD-10-CM | POA: Diagnosis present

## 2019-03-04 NOTE — Telephone Encounter (Signed)
Patient stopped by office today.  [facility] sent her a Arts administrator.  She also brought the envelope I mailed her with the Creon application in it.  I showed her the places to sign and fill in and told her I would be glad to fax it for her so it would get processed quicker.  She is currently waiting on [employer] to send her the out of pocket statement so she can send with the application.  I will check with her next week to see if she has received the statement from [place].  She has my card if she has any questions.

## 2019-03-23 ENCOUNTER — Other Ambulatory Visit: Payer: Self-pay

## 2019-03-23 ENCOUNTER — Inpatient Hospital Stay: Payer: Medicare Other | Attending: Oncology

## 2019-03-23 DIAGNOSIS — C250 Malignant neoplasm of head of pancreas: Secondary | ICD-10-CM | POA: Insufficient documentation

## 2019-03-23 DIAGNOSIS — Z9221 Personal history of antineoplastic chemotherapy: Secondary | ICD-10-CM | POA: Diagnosis not present

## 2019-03-23 DIAGNOSIS — Z95828 Presence of other vascular implants and grafts: Secondary | ICD-10-CM

## 2019-03-23 DIAGNOSIS — Z452 Encounter for adjustment and management of vascular access device: Secondary | ICD-10-CM | POA: Insufficient documentation

## 2019-03-23 MED ORDER — HEPARIN SOD (PORK) LOCK FLUSH 100 UNIT/ML IV SOLN
500.0000 [IU] | Freq: Once | INTRAVENOUS | Status: AC
  Administered 2019-03-23: 500 [IU] via INTRAVENOUS

## 2019-03-23 MED ORDER — SODIUM CHLORIDE 0.9% FLUSH
10.0000 mL | Freq: Once | INTRAVENOUS | Status: AC
  Administered 2019-03-23: 10 mL via INTRAVENOUS
  Filled 2019-03-23: qty 10

## 2019-03-25 NOTE — Telephone Encounter (Addendum)
Called to check the status of patients application.  Rep, Cyan, stated that the patient had not met the prescription out of pocket requirement for assistance, but they emailed the patient a form on 10/15 so she could document other medical expenses she has paid for.  Once they received this information they will be able to determine if the patient is approved or denied.

## 2019-03-27 ENCOUNTER — Other Ambulatory Visit: Payer: Self-pay | Admitting: Oncology

## 2019-04-08 ENCOUNTER — Other Ambulatory Visit: Payer: Self-pay | Admitting: *Deleted

## 2019-04-08 NOTE — Telephone Encounter (Signed)
Informed patient that his was filled 10/19

## 2019-04-19 ENCOUNTER — Other Ambulatory Visit: Payer: Self-pay | Admitting: Oncology

## 2019-04-29 ENCOUNTER — Encounter: Payer: Self-pay | Admitting: Internal Medicine

## 2019-04-29 ENCOUNTER — Ambulatory Visit (INDEPENDENT_AMBULATORY_CARE_PROVIDER_SITE_OTHER): Payer: Medicare Other | Admitting: Internal Medicine

## 2019-04-29 ENCOUNTER — Other Ambulatory Visit: Payer: Self-pay

## 2019-04-29 VITALS — Ht 62.0 in | Wt 148.0 lb

## 2019-04-29 DIAGNOSIS — J449 Chronic obstructive pulmonary disease, unspecified: Secondary | ICD-10-CM

## 2019-04-29 DIAGNOSIS — M79642 Pain in left hand: Secondary | ICD-10-CM

## 2019-04-29 DIAGNOSIS — C259 Malignant neoplasm of pancreas, unspecified: Secondary | ICD-10-CM

## 2019-04-29 DIAGNOSIS — M2550 Pain in unspecified joint: Secondary | ICD-10-CM

## 2019-04-29 DIAGNOSIS — M25562 Pain in left knee: Secondary | ICD-10-CM

## 2019-04-29 DIAGNOSIS — M5020 Other cervical disc displacement, unspecified cervical region: Secondary | ICD-10-CM

## 2019-04-29 DIAGNOSIS — M24549 Contracture, unspecified hand: Secondary | ICD-10-CM

## 2019-04-29 DIAGNOSIS — K7689 Other specified diseases of liver: Secondary | ICD-10-CM | POA: Insufficient documentation

## 2019-04-29 DIAGNOSIS — M25561 Pain in right knee: Secondary | ICD-10-CM

## 2019-04-29 DIAGNOSIS — M898X9 Other specified disorders of bone, unspecified site: Secondary | ICD-10-CM

## 2019-04-29 DIAGNOSIS — G5603 Carpal tunnel syndrome, bilateral upper limbs: Secondary | ICD-10-CM | POA: Insufficient documentation

## 2019-04-29 DIAGNOSIS — M5126 Other intervertebral disc displacement, lumbar region: Secondary | ICD-10-CM

## 2019-04-29 DIAGNOSIS — Z1231 Encounter for screening mammogram for malignant neoplasm of breast: Secondary | ICD-10-CM | POA: Diagnosis not present

## 2019-04-29 DIAGNOSIS — M19011 Primary osteoarthritis, right shoulder: Secondary | ICD-10-CM

## 2019-04-29 DIAGNOSIS — M5136 Other intervertebral disc degeneration, lumbar region: Secondary | ICD-10-CM

## 2019-04-29 DIAGNOSIS — M79641 Pain in right hand: Secondary | ICD-10-CM

## 2019-04-29 DIAGNOSIS — Z8507 Personal history of malignant neoplasm of pancreas: Secondary | ICD-10-CM

## 2019-04-29 DIAGNOSIS — M47812 Spondylosis without myelopathy or radiculopathy, cervical region: Secondary | ICD-10-CM | POA: Insufficient documentation

## 2019-04-29 DIAGNOSIS — G629 Polyneuropathy, unspecified: Secondary | ICD-10-CM | POA: Insufficient documentation

## 2019-04-29 DIAGNOSIS — J439 Emphysema, unspecified: Secondary | ICD-10-CM | POA: Insufficient documentation

## 2019-04-29 HISTORY — DX: Other intervertebral disc displacement, lumbar region: M51.26

## 2019-04-29 HISTORY — DX: Primary osteoarthritis, right shoulder: M19.011

## 2019-04-29 HISTORY — DX: Other cervical disc displacement, unspecified cervical region: M50.20

## 2019-04-29 HISTORY — DX: Carpal tunnel syndrome, bilateral upper limbs: G56.03

## 2019-04-29 NOTE — Progress Notes (Signed)
telephone Note  I connected with Lynn Taylor  on 04/29/19 at  2:00 PM EST by a telephone and verified that I am speaking with the correct person using two identifiers.  Location patient: home Location provider:work or home office Persons participating in the virtual visit: patient, provider  I discussed the limitations of evaluation and management by telemedicine and the availability of in person appointments. The patient expressed understanding and agreed to proceed.   HPI: 1. TOC  2. C/o b/l pain in hands and feet x  Months and emg 03/2019 with b/l CTS and neuropathy cant make a fist. She is off chemo but also has tingilng in hands and feet and hands cant make a fist. Seeing ortho who noted right shoulder arthritis and given voltaren gel which she is not using frequently reviewed imaging lumbar DDD L4/5 bulging disc and arthritis and bulging discs in neck  3. Copd noted on imaging quit > 30 years ago smoking and smoked x 10 years  4. Hypothyroidism f/u Dr. Ronnald Collum appt sch 05/11/2019 and recently had b/w need to get it faxed   ROS: See pertinent positives and negatives per HPI.  Past Medical History:  Diagnosis Date  . Anemia due to antineoplastic chemotherapy 11/25/2018  . Cancer ([place])   . GERD (gastroesophageal reflux disease)   . Hypertension   . Hypomagnesemia 11/25/2018  . Hypothyroidism   . Malignant neoplasm of head of pancreas ([place]) 06/08/2018    Past Surgical History:  Procedure Laterality Date  . CHOLECYSTECTOMY    . COLONOSCOPY N/A 12/02/2016   Procedure: COLONOSCOPY;  Surgeon: Lollie Sails, MD;  Location: [facility] ENDOSCOPY;  Service: Endoscopy;  Laterality: N/A;  . ERCP N/A 04/30/2018   Procedure: ENDOSCOPIC RETROGRADE CHOLANGIOPANCREATOGRAPHY (ERCP);  Surgeon: Lucilla Lame, MD;  Location: [facility] ENDOSCOPY;  Service: Endoscopy;  Laterality: N/A;  . ERCP N/A 05/05/2018   Procedure: ENDOSCOPIC RETROGRADE CHOLANGIOPANCREATOGRAPHY (ERCP);  Surgeon: Lucilla Lame, MD;   Location: [facility] ENDOSCOPY;  Service: Endoscopy;  Laterality: N/A;  . PORTA CATH INSERTION N/A 06/28/2018   Procedure: PORTA CATH INSERTION;  Surgeon: Algernon Huxley, MD;  Location: [place] CV LAB;  Service: Cardiovascular;  Laterality: N/A;    Family History  Adopted: Yes  Problem Relation Age of Onset  . Diabetes Mellitus I Other   . Alcoholism Other   . Hypertension Other   . Hyperlipidemia Other   . Coronary artery disease Other   . Stroke Other   . Osteoarthritis Other   . Migraines Other   . Heart Problems Mother   . Stroke Sister   . CAD Brother   . Stroke Brother   . Breast cancer Neg Hx     SOCIAL HX:  Lives at home    Current Outpatient Medications:  .  acetaminophen-codeine (TYLENOL #3) 300-30 MG tablet, Take 1 tablet by mouth every 4 (four) hours as needed for moderate pain., Disp: 20 tablet, Rfl: 0 .  apixaban (ELIQUIS) 5 MG TABS tablet, Take 1 tablet (5 mg total) by mouth 2 (two) times daily., Disp: 180 tablet, Rfl: 0 .  chlorhexidine (PERIDEX) 0.12 % solution, Use as directed 15 mLs in the mouth or throat 2 (two) times daily., Disp: 473 mL, Rfl: 3 .  Cholecalciferol (VITAMIN D-3) 25 MCG (1000 UT) CAPS, Take by mouth., Disp: , Rfl:  .  CREON 36000 units CPEP capsule, TAKE 1 CAPSULE BY MOUTH THREE TIMES DAILY WITH MEALS, Disp: 90 capsule, Rfl: 0 .  diclofenac Sodium (VOLTAREN) 1 % GEL,  Apply topically 4 (four) times daily., Disp: , Rfl:  .  gabapentin (NEURONTIN) 400 MG capsule, Take 1 capsule (400 mg total) by mouth 3 (three) times daily., Disp: 270 capsule, Rfl: 1 .  levothyroxine (SYNTHROID, LEVOTHROID) 100 MCG tablet, Take 1 tablet (100 mcg total) by mouth daily before breakfast., Disp: 90 tablet, Rfl: 4 .  lidocaine-prilocaine (EMLA) cream, Apply 1 application topically as needed., Disp: 30 g, Rfl: 3 .  loperamide (IMODIUM) 2 MG capsule, Take 1 capsule (2 mg total) by mouth See admin instructions. With onset of loose stool, take 4mg  followed by 2mg  every 2  hours until 12 hours have passed without loose bowel movement. Maximum: 16 mg/day, Disp: 120 capsule, Rfl: 1 .  loratadine (CLARITIN) 10 MG tablet, Take 10 mg by mouth daily., Disp: , Rfl:  .  Magnesium Cl-Calcium Carbonate (SLOW-MAG PO), Take 2 tablets by mouth daily., Disp: , Rfl:  .  nystatin (MYCOSTATIN) 100000 UNIT/ML suspension, TAKE 5 MLS (500,000 UNITS  TOTAL) BY MOUTH 4 (FOUR)  TIMES DAILY., Disp: 946 mL, Rfl: 0 .  omeprazole (PRILOSEC) 20 MG capsule, Take 1 capsule by mouth 1 day or 1 dose., Disp: , Rfl:  .  ondansetron (ZOFRAN) 4 MG tablet, TAKE 1 TABLET BY MOUTH  EVERY 6 HOURS AS NEEDED FOR NAUSEA OR VOMITING., Disp: 180 tablet, Rfl: 1 .  potassium chloride (K-DUR) 10 MEQ tablet, Take 1 tablet (10 mEq total) by mouth daily., Disp: 90 tablet, Rfl: 0 .  prochlorperazine (COMPAZINE) 10 MG tablet, TAKE 1 TABLET BY MOUTH  EVERY 6 HOURS AS NEEDED, Disp: 90 tablet, Rfl: 0  EXAM:  VITALS per patient if applicable:  GENERAL: alert, oriented, appears well and in no acute distress  PSYCH/NEURO: pleasant and cooperative, no obvious depression or anxiety, speech and thought processing grossly intact  ASSESSMENT AND PLAN:  Discussed the following assessment and plan:  Malignant neoplasm of pancreas, unspecified location of malignancy ([place]) - Plan: NM Bone Scan Whole Body r/o bone mets with jt pain  R/o autoimmune d/o    DDD (degenerative disc disease), lumbar Arthritis of shoulder region, right Spondylosis, cervical Herniated disc, cervical Lumbar herniated disc -f/u ortho or consider NS vs ortho spine -prn voltaren gel  For hand contractures r/o autoimmune vs consider see hand surgery vs NS   Chronic obstructive pulmonary disease, unspecified COPD type ([place]) Former smoker quit 30 years ago smoked x 10 years   Bilateral carpal tunnel syndrome -consider hand surgery   Polyneuropathy -on gabapentin 400 tid hold increase dose for now   Hypothyroidism  F/u Dr. Ronnald Collum need to  get copy of recent labs   If no A1C need this and lipid as well as tsh   HM Flu shot declines Prevnar, pna 23 declines  Tdap, shingrix declines   mammo referred due  Colonoscopy 12/02/16 sessile polyp and tubular adenoma [facility]  No pap due dexa 07/27/15 normal   -we discussed possible serious and likely etiologies, options for evaluation and workup, limitations of telemedicine visit vs in person visit, treatment, treatment risks and precautions. Pt prefers to treat via telemedicine empirically rather then risking or undertaking an in person visit at this moment. Patient agrees to seek prompt in person care if worsening, new symptoms arise, or if is not improving with treatment.   I discussed the assessment and treatment plan with the patient. The patient was provided an opportunity to ask questions and all were answered. The patient agreed with the plan and demonstrated an understanding  of the instructions.   The patient was advised to call back or seek an in-person evaluation if the symptoms worsen or if the condition fails to improve as anticipated.  Time spent 25 minutes  Delorise Jackson, MD

## 2019-04-30 ENCOUNTER — Other Ambulatory Visit: Payer: Self-pay | Admitting: Oncology

## 2019-05-02 ENCOUNTER — Encounter: Payer: Self-pay | Admitting: Oncology

## 2019-05-02 ENCOUNTER — Telehealth: Payer: Self-pay | Admitting: *Deleted

## 2019-05-02 NOTE — Telephone Encounter (Signed)
...    Ref Range & Units 56mo ago (02/24/19) 58mo ago (12/29/18) 28mo ago (12/15/18)  Potassium 3.5 - 5.1 mmol/L 3.4Low   3.6  3.8

## 2019-05-02 NOTE — Telephone Encounter (Signed)
Patient called asking if she needs to have CT 12/7 as she just had one done at [facility] on 10/26/\. Please advise

## 2019-05-06 ENCOUNTER — Other Ambulatory Visit: Payer: Self-pay | Admitting: Oncology

## 2019-05-07 ENCOUNTER — Other Ambulatory Visit: Payer: Self-pay | Admitting: Oncology

## 2019-05-08 NOTE — Telephone Encounter (Signed)
Ok to cancel CT. She will keep her appt with me.

## 2019-05-09 NOTE — Telephone Encounter (Signed)
Lynn Taylor, please cancel the CT and inform patient.  Thanks

## 2019-05-12 ENCOUNTER — Encounter: Payer: Self-pay | Admitting: Oncology

## 2019-05-13 ENCOUNTER — Encounter: Payer: Self-pay | Admitting: Internal Medicine

## 2019-05-13 ENCOUNTER — Other Ambulatory Visit: Payer: Self-pay

## 2019-05-13 MED ORDER — PANCRELIPASE (LIP-PROT-AMYL) 36000-114000 UNITS PO CPEP: ORAL_CAPSULE | ORAL | 3 refills | Status: DC

## 2019-05-16 ENCOUNTER — Other Ambulatory Visit: Payer: Medicare Other

## 2019-05-17 NOTE — Progress Notes (Signed)
Patient reports neuropathy in feet that isn't painful but described as "discomfort".

## 2019-05-18 ENCOUNTER — Inpatient Hospital Stay (HOSPITAL_BASED_OUTPATIENT_CLINIC_OR_DEPARTMENT_OTHER): Payer: Medicare Other | Admitting: Oncology

## 2019-05-18 ENCOUNTER — Inpatient Hospital Stay: Payer: Medicare Other | Attending: Oncology

## 2019-05-18 ENCOUNTER — Other Ambulatory Visit: Payer: Self-pay

## 2019-05-18 ENCOUNTER — Encounter: Payer: Self-pay | Admitting: Oncology

## 2019-05-18 VITALS — BP 160/89 | HR 90 | Temp 97.8°F | Resp 18 | Wt 149.7 lb

## 2019-05-18 DIAGNOSIS — G629 Polyneuropathy, unspecified: Secondary | ICD-10-CM

## 2019-05-18 DIAGNOSIS — C250 Malignant neoplasm of head of pancreas: Secondary | ICD-10-CM | POA: Diagnosis not present

## 2019-05-18 DIAGNOSIS — Z86718 Personal history of other venous thrombosis and embolism: Secondary | ICD-10-CM | POA: Insufficient documentation

## 2019-05-18 DIAGNOSIS — D649 Anemia, unspecified: Secondary | ICD-10-CM

## 2019-05-18 DIAGNOSIS — D6481 Anemia due to antineoplastic chemotherapy: Secondary | ICD-10-CM

## 2019-05-18 DIAGNOSIS — Z9049 Acquired absence of other specified parts of digestive tract: Secondary | ICD-10-CM | POA: Diagnosis not present

## 2019-05-18 DIAGNOSIS — Z79899 Other long term (current) drug therapy: Secondary | ICD-10-CM | POA: Insufficient documentation

## 2019-05-18 DIAGNOSIS — Z95828 Presence of other vascular implants and grafts: Secondary | ICD-10-CM | POA: Diagnosis not present

## 2019-05-18 DIAGNOSIS — Z87891 Personal history of nicotine dependence: Secondary | ICD-10-CM | POA: Insufficient documentation

## 2019-05-18 DIAGNOSIS — I10 Essential (primary) hypertension: Secondary | ICD-10-CM | POA: Diagnosis not present

## 2019-05-18 DIAGNOSIS — Z7901 Long term (current) use of anticoagulants: Secondary | ICD-10-CM | POA: Insufficient documentation

## 2019-05-18 DIAGNOSIS — E039 Hypothyroidism, unspecified: Secondary | ICD-10-CM | POA: Insufficient documentation

## 2019-05-18 DIAGNOSIS — E876 Hypokalemia: Secondary | ICD-10-CM | POA: Diagnosis not present

## 2019-05-18 DIAGNOSIS — Z452 Encounter for adjustment and management of vascular access device: Secondary | ICD-10-CM | POA: Diagnosis not present

## 2019-05-18 DIAGNOSIS — T451X5A Adverse effect of antineoplastic and immunosuppressive drugs, initial encounter: Secondary | ICD-10-CM

## 2019-05-18 DIAGNOSIS — R7989 Other specified abnormal findings of blood chemistry: Secondary | ICD-10-CM | POA: Insufficient documentation

## 2019-05-18 DIAGNOSIS — Z791 Long term (current) use of non-steroidal anti-inflammatories (NSAID): Secondary | ICD-10-CM | POA: Diagnosis not present

## 2019-05-18 DIAGNOSIS — K219 Gastro-esophageal reflux disease without esophagitis: Secondary | ICD-10-CM | POA: Diagnosis not present

## 2019-05-18 LAB — COMPREHENSIVE METABOLIC PANEL
ALT: 14 U/L (ref 0–44)
AST: 20 U/L (ref 15–41)
Albumin: 3.9 g/dL (ref 3.5–5.0)
Alkaline Phosphatase: 93 U/L (ref 38–126)
Anion gap: 3 — ABNORMAL LOW (ref 5–15)
BUN: 18 mg/dL (ref 8–23)
CO2: 29 mmol/L (ref 22–32)
Calcium: 9.9 mg/dL (ref 8.9–10.3)
Chloride: 105 mmol/L (ref 98–111)
Creatinine, Ser: 0.67 mg/dL (ref 0.44–1.00)
GFR calc Af Amer: >60 mL/min (ref >60)
GFR calc non Af Amer: >60 mL/min (ref >60)
Glucose, Bld: 85 mg/dL (ref 70–99)
Potassium: 3.8 mmol/L (ref 3.5–5.1)
Sodium: 137 mmol/L (ref 135–145)
Total Bilirubin: 0.6 mg/dL (ref 0.3–1.2)
Total Protein: 7.6 g/dL (ref 6.5–8.1)

## 2019-05-18 LAB — CBC WITH DIFFERENTIAL/PLATELET
Abs Immature Granulocytes: 0.02 10*3/uL (ref 0.00–0.07)
Basophils Absolute: 0 10*3/uL (ref 0.0–0.1)
Basophils Relative: 0 %
Eosinophils Absolute: 0 10*3/uL (ref 0.0–0.5)
Eosinophils Relative: 1 %
HCT: 34.9 % — ABNORMAL LOW (ref 36.0–46.0)
Hemoglobin: 10.7 g/dL — ABNORMAL LOW (ref 12.0–15.0)
Immature Granulocytes: 0 %
Lymphocytes Relative: 34 %
Lymphs Abs: 2.2 10*3/uL (ref 0.7–4.0)
MCH: 27.1 pg (ref 26.0–34.0)
MCHC: 30.7 g/dL (ref 30.0–36.0)
MCV: 88.4 fL (ref 80.0–100.0)
Monocytes Absolute: 0.4 10*3/uL (ref 0.1–1.0)
Monocytes Relative: 7 %
Neutro Abs: 3.9 10*3/uL (ref 1.7–7.7)
Neutrophils Relative %: 58 %
Platelets: 175 10*3/uL (ref 150–400)
RBC: 3.95 MIL/uL (ref 3.87–5.11)
RDW: 15.6 % — ABNORMAL HIGH (ref 11.5–15.5)
WBC: 6.6 10*3/uL (ref 4.0–10.5)
nRBC: 0 % (ref 0.0–0.2)

## 2019-05-18 MED ORDER — HEPARIN SOD (PORK) LOCK FLUSH 100 UNIT/ML IV SOLN
500.0000 [IU] | Freq: Once | INTRAVENOUS | Status: AC
  Administered 2019-05-18: 500 [IU] via INTRAVENOUS

## 2019-05-18 MED ORDER — SODIUM CHLORIDE 0.9% FLUSH
10.0000 mL | Freq: Once | INTRAVENOUS | Status: AC
  Administered 2019-05-18: 13:00:00 10 mL via INTRAVENOUS
  Filled 2019-05-18: qty 10

## 2019-05-18 NOTE — Progress Notes (Signed)
Hematology/Oncology Follow Up Note [facility]  [phone] [phone]  Patient Care Team: McLean-Scocuzza, Nino Glow, MD as PCP - General (Internal Medicine) Clent Jacks, RN as Registered Nurse   Name of the patient: Lynn Taylor  OR:[id number]  08-15-1945   REASON FOR VISIT  follow-up for adjuvant chemotherapy for pancreatic cancer.  HISTORY OF PRESENTING ILLNESS:  Lynn Taylor is a  73 y.o.  female with PMH listed below who was referred to me for evaluation of evaluation of elevated ferritin. Patient recently had lab work-up done on 04/09/2018 which showed ferritin level 1877, iron 97, TIBC 340, iron saturation 29, 04/11/2018 CBC showed hemoglobin 13.2, MCV 81, WBC 7.6, platelet counts 246,000. Patient was referred to hematology for further evaluation of elevated ferritin.  #04/28/2018 Ultrasound of liver was obtained which showed CBD and intrahepatic biliary dilatation Patient wa she is s advised to go to emergency room for further evaluation. Also had hyperbilirubinemia.  Patient was complaining about being jaundiced, pruritus all over. MR abdomen MRCP with and without contrast showed market biliary duct dilatation, secondary to obstruction in the region of pancreatic head.  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 No abdominal adenopathy, liver metastasis or cross vascular involvement.  patient was evaluated by gastroenterology and status post ERCP with stenting. CA 19.9 1173 CEA 3.9 # 05/25/2018  patient was referred to [facility] and s/p whipple resection.Her postop course was c/b Type A pancreatic leak and c.diff. Pathology showed A. Hepatic artery lymph node, excision: One lymph node, negative for malignancy (0/1). B. Biliary stent, removal: Medical device, consistent with stent, gross examination only. C. 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 All margins are negative (closest margin=uncinate, 0.5 mm) Metastatic adenocarcinoma in one of thirty-two lymph nodes (1/32).  Portion of stomach and duodenum with no specific pathologic diagnosis.   Grade, 3 poorly differentiated.  Pathologic pT2 pN1  Cancer Treatment 07/13/2018 Cycle 1 FOLFIRINOX with Onpro Neulasta.  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 10/06/2018 cycle 6  mFOLFIRINOX with Onpro Neulasta - [50% dose for Irinotecan- 5-FU bolus omitted]. 10/20/2018 cycle 7 mFOLFIRINOX with Onpro Neulasta - [50% dose for Irinotecan- 5-FU bolus omitted].  INTERVAL HISTORY Lynn Taylor is a 73 y.o. female with history of stage IIb pancreatic cancer presents to for follow-up.    #Patient continues to improve.  Appetite is good.  She has gained weight during the interval. 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  She takes gabapentin. No additional loose bowel movement episodes.  She takes cryo-. Patient is on Eliquis 5 mg twice daily for DVT treatments.  Lower extremity swelling has largely improved and resolved.  mild edema.   Denies any fever, chills, nausea, vomiting, abdominal pain.   Review of Systems  Constitutional: Negative for appetite change, chills, fatigue, fever and unexpected weight change.  HENT:   Negative for hearing loss and voice change.   Eyes: Negative for  eye problems.  Respiratory: Negative  for chest tightness, cough and shortness of breath.   Cardiovascular: Negative for chest pain and leg swelling.  Gastrointestinal: Negative for abdominal distention, abdominal pain, blood in stool and diarrhea.  Endocrine: Negative for hot flashes.  Genitourinary: Negative for difficulty urinating and frequency.   Musculoskeletal: Negative for arthralgias.  Skin: Negative for itching and rash.  Neurological: Positive for numbness. Negative for extremity weakness.  Hematological: Negative for adenopathy.  Psychiatric/Behavioral: Negative for confusion.      No Known Allergies   Past Medical History:  Diagnosis Date  . Anemia due to antineoplastic chemotherapy 11/25/2018  . Cancer ([place])   . GERD (gastroesophageal reflux disease)   . Hypertension   . Hypomagnesemia 11/25/2018  . Hypothyroidism   . Malignant neoplasm of head of pancreas ([place]) 06/08/2018     Past Surgical History:  Procedure Laterality Date  . CHOLECYSTECTOMY    . COLONOSCOPY N/A 12/02/2016   Procedure: COLONOSCOPY;  Surgeon: Lollie Sails, MD;  Location: [facility] ENDOSCOPY;  Service: Endoscopy;  Laterality: N/A;  . ERCP N/A 04/30/2018   Procedure: ENDOSCOPIC RETROGRADE CHOLANGIOPANCREATOGRAPHY (ERCP);  Surgeon: Lucilla Lame, MD;  Location: [facility] ENDOSCOPY;  Service: Endoscopy;  Laterality: N/A;  . ERCP N/A 05/05/2018   Procedure: ENDOSCOPIC RETROGRADE CHOLANGIOPANCREATOGRAPHY (ERCP);  Surgeon: Lucilla Lame, MD;  Location: [facility] ENDOSCOPY;  Service: Endoscopy;  Laterality: N/A;  . PORTA CATH INSERTION N/A 06/28/2018   Procedure: PORTA CATH INSERTION;  Surgeon: Algernon Huxley, MD;  Location: [place] CV LAB;  Service: Cardiovascular;  Laterality: N/A;    Social History   Socioeconomic History  . Marital status: Married    Spouse name: Not on file  . Number of children: Not on file  . Years of education: Not on file  . Highest education level: Not on file  Occupational  History  . Not on file  Social Needs  . Financial resource strain: Not hard at all  . Food insecurity    Worry: Never true    Inability: Never true  . Transportation needs    Medical: No    Non-medical: No  Tobacco Use  . Smoking status: Former Smoker    Quit date: 04/14/1988    Years since quitting: 31.1  . Smokeless tobacco: Never Used  Substance and Sexual Activity  . Alcohol use: Yes    Comment: social drinker  . Drug use: No  . Sexual activity: Not Currently  Lifestyle  . Physical activity    Days per week: 5 days    Minutes per session: 30 min  . Stress: Not at all  Relationships  . Social Herbalist on phone: Not on file    Gets together: Not on file    Attends religious service: Not on file    Active member of club or organization: Not on file    Attends meetings of clubs or organizations: Not on file    Relationship status: Not on file  . Intimate partner violence    Fear of current or ex partner: No    Emotionally abused: No    Physically abused: No    Forced sexual activity: No  Other Topics Concern  . Not on file  Social History Narrative   Lives at home     Family History  Adopted: Yes  Problem Relation Age of Onset  . Diabetes Mellitus I Other   . Alcoholism Other   . Hypertension Other   . Hyperlipidemia Other   . Coronary artery disease Other   .  Stroke Other   . Osteoarthritis Other   . Migraines Other   . Heart Problems Mother   . Stroke Sister   . CAD Brother   . Stroke Brother   . Breast cancer Neg Hx      Current Outpatient Medications:  .  acetaminophen-codeine (TYLENOL #3) 300-30 MG tablet, Take 1 tablet by mouth every 4 (four) hours as needed for moderate pain., Disp: 20 tablet, Rfl: 0 .  apixaban (ELIQUIS) 5 MG TABS tablet, Take 1 tablet (5 mg total) by mouth 2 (two) times daily., Disp: 180 tablet, Rfl: 0 .  Cholecalciferol (VITAMIN D-3) 25 MCG (1000 UT) CAPS, Take by mouth., Disp: , Rfl:  .  diclofenac Sodium  (VOLTAREN) 1 % GEL, Apply topically 4 (four) times daily., Disp: , Rfl:  .  gabapentin (NEURONTIN) 400 MG capsule, Take 1 capsule (400 mg total) by mouth 3 (three) times daily., Disp: 270 capsule, Rfl: 1 .  levothyroxine (SYNTHROID, LEVOTHROID) 100 MCG tablet, Take 1 tablet (100 mcg total) by mouth daily before breakfast., Disp: 90 tablet, Rfl: 4 .  lidocaine-prilocaine (EMLA) cream, Apply 1 application topically as needed., Disp: 30 g, Rfl: 3 .  lipase/protease/amylase (CREON) 36000 UNITS CPEP capsule, TAKE 1 CAPSULE BY MOUTH THREE TIMES DAILY WITH MEALS, Disp: 90 capsule, Rfl: 3 .  loperamide (IMODIUM) 2 MG capsule, Take 1 capsule (2 mg total) by mouth See admin instructions. With onset of loose stool, take 4mg  followed by 2mg  every 2 hours until 12 hours have passed without loose bowel movement. Maximum: 16 mg/day, Disp: 120 capsule, Rfl: 1 .  loratadine (CLARITIN) 10 MG tablet, Take 10 mg by mouth daily., Disp: , Rfl:  .  Magnesium Cl-Calcium Carbonate (SLOW-MAG PO), Take 2 tablets by mouth daily., Disp: , Rfl:  .  nystatin (MYCOSTATIN) 100000 UNIT/ML suspension, TAKE 5 MLS (500,000 UNITS  TOTAL) BY MOUTH 4 (FOUR)  TIMES DAILY., Disp: 946 mL, Rfl: 0 .  omeprazole (PRILOSEC) 20 MG capsule, Take 1 capsule by mouth 1 day or 1 dose., Disp: , Rfl:  .  ondansetron (ZOFRAN) 4 MG tablet, TAKE 1 TABLET BY MOUTH  EVERY 6 HOURS AS NEEDED FOR NAUSEA OR VOMITING., Disp: 180 tablet, Rfl: 1 .  potassium chloride (K-DUR) 10 MEQ tablet, Take 1 tablet (10 mEq total) by mouth daily., Disp: 90 tablet, Rfl: 0 .  prochlorperazine (COMPAZINE) 10 MG tablet, TAKE 1 TABLET BY MOUTH  EVERY 6 HOURS AS NEEDED, Disp: 90 tablet, Rfl: 0 .  chlorhexidine (PERIDEX) 0.12 % solution, Use as directed 15 mLs in the mouth or throat 2 (two) times daily. (Patient not taking: Reported on 05/17/2019), Disp: 473 mL, Rfl: 3 .  potassium chloride (KLOR-CON) 10 MEQ tablet, TAKE 1 TABLET BY MOUTH  DAILY, Disp: 90 tablet, Rfl: 0  Physical exam:  ECOG 2 Vitals:   05/17/19 1513  BP: (!) 160/89  Pulse: 90  Resp: 18  Temp: 97.8 F (36.6 C)  Weight: 149 lb 11.2 oz (67.9 kg)   Physical Exam Constitutional:      General: She is not in acute distress. HENT:     Head: Normocephalic and atraumatic.  Eyes:     General: No scleral icterus.    Pupils: Pupils are equal, round, and reactive to light.  Neck:     Musculoskeletal: Normal range of motion and neck supple.  Cardiovascular:     Rate and Rhythm: Normal rate and regular rhythm.     Heart sounds: Normal heart sounds.  Pulmonary:  Effort: Pulmonary effort is normal. No respiratory distress.     Breath sounds: No wheezing.  Abdominal:     General: Bowel sounds are normal. There is no distension.     Palpations: Abdomen is soft. There is no mass.     Tenderness: There is no abdominal tenderness.  Musculoskeletal: Normal range of motion.        General: No swelling or deformity.  Skin:    General: Skin is warm and dry.     Findings: No erythema or rash.  Neurological:     Mental Status: She is alert and oriented to person, place, and time.     Cranial Nerves: No cranial nerve deficit.     Coordination: Coordination normal.  Psychiatric:        Behavior: Behavior normal.        Thought Content: Thought content normal.     CMP Latest Ref Rng & Units 05/18/2019  Glucose 70 - 99 mg/dL 85  BUN 8 - 23 mg/dL 18  Creatinine 0.44 - 1.00 mg/dL 0.67  Sodium 135 - 145 mmol/L 137  Potassium 3.5 - 5.1 mmol/L 3.8  Chloride 98 - 111 mmol/L 105  CO2 22 - 32 mmol/L 29  Calcium 8.9 - 10.3 mg/dL 9.9  Total Protein 6.5 - 8.1 g/dL 7.6  Total Bilirubin 0.3 - 1.2 mg/dL 0.6  Alkaline Phos 38 - 126 U/L 93  AST 15 - 41 U/L 20  ALT 0 - 44 U/L 14   CBC Latest Ref Rng & Units 05/18/2019  WBC 4.0 - 10.5 K/uL 6.6  Hemoglobin 12.0 - 15.0 g/dL 10.7(L)  Hematocrit 36.0 - 46.0 % 34.9(L)  Platelets 150 - 400 K/uL 175   RADIOGRAPHIC STUDIES: I have personally reviewed the radiological  images as listed and agreed with the findings in the report.  No results found.   Assessment and plan Patient is a 73 y.o. female history of stage IIb pancreatic cancer present for discussion of image results and management of newly diagnosed bilateral lower extremity DVT. 1. History of deep vein thrombosis (DVT) of lower extremity   2. Malignant neoplasm of head of pancreas ([place])   3. Port-A-Cath in place   4. Anemia, unspecified type   5. Hypokalemia   6. Neuropathy    #Stage IIB pancreatic cancer status post surgical resection. Previously received 7 out of the 12 recommended cycles of mFOLFIRINOX with  Difficulties and have decided not to proceed with additional adjuvant chemotherapy. Patient recently had CT dual pancreas chest abdomen pelvis. No CT evidence of metastatic disease within the chest abdomen and pelvis.   Patient has an appointment with.[facility] every 3 months.  Continue surveillance images at [facility].   #Anemia, hemoglobin slowly recovering. Hemoglobin has improved.  Continue to monitor. #  ChronicHypokalemia, potassium level is normal today.  Recommend patient to continue take potassium chloride 10 mEq daily  #History of bilateral lower extremity DVT, tolerates Eliquis 5 mg twice daily.   Recommend to obtain bilateral lower extremity venous duplex. Further management plan pending on ultrasound results.  #Chemotherapy-induced neuropathy, continue gabapentin 400 mg 3 times daily. #Discussed about keeping Mediport for 1 to 2 years.  Continue port flush every  6 to 8 weeks.  Follow-up pain in 6 months.Earlie Server, MD, PhD Hematology Oncology [facility] 05/18/2019

## 2019-05-19 ENCOUNTER — Encounter
Admission: RE | Admit: 2019-05-19 | Discharge: 2019-05-19 | Disposition: A | Discharge status: Home / Self Care | Payer: Medicare Other | Source: Ambulatory Visit | Attending: Internal Medicine | Admitting: Internal Medicine

## 2019-05-19 DIAGNOSIS — Z8507 Personal history of malignant neoplasm of pancreas: Secondary | ICD-10-CM | POA: Diagnosis not present

## 2019-05-19 DIAGNOSIS — M898X9 Other specified disorders of bone, unspecified site: Secondary | ICD-10-CM | POA: Diagnosis present

## 2019-05-19 DIAGNOSIS — C259 Malignant neoplasm of pancreas, unspecified: Secondary | ICD-10-CM

## 2019-05-19 LAB — CANCER ANTIGEN 19-9: CA 19-9: 17 U/mL (ref 0–35)

## 2019-05-19 MED ORDER — TECHNETIUM TC 99M MEDRONATE IV KIT
20.0000 | PACK | Freq: Once | INTRAVENOUS | Status: AC | PRN
  Administered 2019-05-19: 23.796 via INTRAVENOUS

## 2019-05-25 ENCOUNTER — Ambulatory Visit: Payer: Medicare Other | Admitting: Oncology

## 2019-05-25 ENCOUNTER — Other Ambulatory Visit: Payer: Medicare Other

## 2019-05-26 ENCOUNTER — Ambulatory Visit: Payer: Medicare Other

## 2019-05-27 ENCOUNTER — Ambulatory Visit
Admission: RE | Admit: 2019-05-27 | Discharge: 2019-05-27 | Disposition: A | Discharge status: Home / Self Care | Payer: Medicare Other | Source: Ambulatory Visit | Attending: Oncology | Admitting: Oncology

## 2019-05-27 ENCOUNTER — Other Ambulatory Visit: Payer: Self-pay

## 2019-05-27 DIAGNOSIS — Z86718 Personal history of other venous thrombosis and embolism: Secondary | ICD-10-CM | POA: Insufficient documentation

## 2019-06-06 ENCOUNTER — Other Ambulatory Visit: Payer: Self-pay | Admitting: Oncology

## 2019-06-06 MED ORDER — APIXABAN 2.5 MG PO TABS: 2.5000 mg | ORAL_TABLET | Freq: Two times a day (BID) | ORAL | 3 refills | Status: DC

## 2019-06-06 NOTE — Progress Notes (Signed)
Please let pt know that her DVT has significantly improved.  I recommend her to switch to Eliquis 2.5mg  BID. Rx will be sent to her pharmacy.  Follow up as scheduled.

## 2019-06-07 ENCOUNTER — Telehealth: Payer: Self-pay

## 2019-06-07 NOTE — Telephone Encounter (Signed)
-----   Message from Earlie Server, MD sent at 06/06/2019 10:08 PM EST ----- Please let pt know that her DVT has significantly improved.  I recommend her to switch to Eliquis 2.5mg  BID. Rx will be sent to her pharmacy.  Follow up as scheduled.

## 2019-06-07 NOTE — Telephone Encounter (Signed)
Patient called back and I gave her the message from Dr Tasia Catchings. She repeated this back to me and thanked me for calling her back

## 2019-06-08 ENCOUNTER — Other Ambulatory Visit: Payer: Self-pay

## 2019-06-14 ENCOUNTER — Encounter: Payer: Self-pay | Admitting: Internal Medicine

## 2019-06-14 ENCOUNTER — Ambulatory Visit (INDEPENDENT_AMBULATORY_CARE_PROVIDER_SITE_OTHER): Payer: Medicare Other | Admitting: Internal Medicine

## 2019-06-14 ENCOUNTER — Other Ambulatory Visit: Payer: Self-pay

## 2019-06-14 VITALS — BP 148/98 | HR 73 | Temp 97.2°F | Resp 18 | Ht 63.0 in | Wt 153.8 lb

## 2019-06-14 DIAGNOSIS — I10 Essential (primary) hypertension: Secondary | ICD-10-CM | POA: Diagnosis not present

## 2019-06-14 DIAGNOSIS — R739 Hyperglycemia, unspecified: Secondary | ICD-10-CM

## 2019-06-14 DIAGNOSIS — Z1389 Encounter for screening for other disorder: Secondary | ICD-10-CM

## 2019-06-14 DIAGNOSIS — M19049 Primary osteoarthritis, unspecified hand: Secondary | ICD-10-CM

## 2019-06-14 DIAGNOSIS — E039 Hypothyroidism, unspecified: Secondary | ICD-10-CM

## 2019-06-14 DIAGNOSIS — L943 Sclerodactyly: Secondary | ICD-10-CM

## 2019-06-14 DIAGNOSIS — M79642 Pain in left hand: Secondary | ICD-10-CM

## 2019-06-14 DIAGNOSIS — M79641 Pain in right hand: Secondary | ICD-10-CM

## 2019-06-14 DIAGNOSIS — Z13220 Encounter for screening for lipoid disorders: Secondary | ICD-10-CM

## 2019-06-14 DIAGNOSIS — D649 Anemia, unspecified: Secondary | ICD-10-CM

## 2019-06-14 HISTORY — DX: Primary osteoarthritis, unspecified hand: M19.049

## 2019-06-14 MED ORDER — HYDROCHLOROTHIAZIDE 12.5 MG PO TABS: 12.5000 mg | ORAL_TABLET | Freq: Every day | ORAL | 3 refills | Status: DC

## 2019-06-14 MED ORDER — LEVOTHYROXINE SODIUM 112 MCG PO TABS: 112.0000 ug | ORAL_TABLET | Freq: Every day | ORAL | 3 refills | Status: DC

## 2019-06-14 NOTE — Progress Notes (Signed)
Chief Complaint  Patient presents with  . Follow-up   F/u  1. Elevated BP today not on meds prev BP160/89 05/18/2019 she has gained weight ~ 20 lbs since 02/2019 due to inactivity and also eats salty foods Possibly FH HTN in sisters    2. H/o pancreas cancer bone scan normal reviewed with patient today no mets 3. C/o inability/reduced ROM with hands and pain R>L prior EMG with neuropathy  Results for EMERA, SPATH (MRN [id number]) as of 06/14/2019 12:31  04/21/2018 11:03 Anti Nuclear Antibody (ANA): Negative Mitochondrial Ab: <20.0   Review of Systems  Constitutional: Negative for weight loss.  HENT: Negative for hearing loss.   Respiratory: Negative for shortness of breath.   Cardiovascular: Negative for chest pain.  Musculoskeletal: Positive for joint pain.  Neurological: Negative for dizziness and headaches.  Psychiatric/Behavioral: Negative for memory loss.   Past Medical History:  Diagnosis Date  . Anemia due to antineoplastic chemotherapy 11/25/2018  . Cancer ([place])   . GERD (gastroesophageal reflux disease)   . Hypertension   . Hypomagnesemia 11/25/2018  . Hypothyroidism   . Malignant neoplasm of head of pancreas ([place]) 06/08/2018   Past Surgical History:  Procedure Laterality Date  . CHOLECYSTECTOMY    . COLONOSCOPY N/A 12/02/2016   Procedure: COLONOSCOPY;  Surgeon: Lollie Sails, MD;  Location: [facility] ENDOSCOPY;  Service: Endoscopy;  Laterality: N/A;  . ERCP N/A 04/30/2018   Procedure: ENDOSCOPIC RETROGRADE CHOLANGIOPANCREATOGRAPHY (ERCP);  Surgeon: Lucilla Lame, MD;  Location: [facility] ENDOSCOPY;  Service: Endoscopy;  Laterality: N/A;  . ERCP N/A 05/05/2018   Procedure: ENDOSCOPIC RETROGRADE CHOLANGIOPANCREATOGRAPHY (ERCP);  Surgeon: Lucilla Lame, MD;  Location: [facility] ENDOSCOPY;  Service: Endoscopy;  Laterality: N/A;  . PORTA CATH INSERTION N/A 06/28/2018   Procedure: PORTA CATH INSERTION;  Surgeon: Algernon Huxley, MD;  Location: [place] [facility] LAB;  Service:  Cardiovascular;  Laterality: N/A;   Family History  Adopted: Yes  Problem Relation Age of Onset  . Diabetes Mellitus I Other   . Alcoholism Other   . Hypertension Other   . Hyperlipidemia Other   . Coronary artery disease Other   . Stroke Other   . Osteoarthritis Other   . Migraines Other   . Heart Problems Mother   . Stroke Sister   . CAD Brother   . Stroke Brother   . Breast cancer Neg Hx    Social History   Socioeconomic History  . Marital status: Married    Spouse name: Not on file  . Number of children: Not on file  . Years of education: Not on file  . Highest education level: Not on file  Occupational History  . Not on file  Tobacco Use  . Smoking status: Former Smoker    Quit date: 04/14/1988    Years since quitting: 31.1  . Smokeless tobacco: Never Used  Substance and Sexual Activity  . Alcohol use: Yes    Comment: social drinker  . Drug use: No  . Sexual activity: Not Currently  Other Topics Concern  . Not on file  Social History Narrative   Lives at home married    In order of [place] star   Social Determinants of Health   Financial Resource Strain: Low Risk   . Difficulty of Paying Living Expenses: Not hard at all  Food Insecurity: No Food Insecurity  . Worried About Charity fundraiser in the Last Year: Never true  . Ran Out of Food in the Last Year: Never true  Transportation Needs: No Transportation Needs  . Lack of Transportation (Medical): No  . Lack of Transportation (Non-Medical): No  Physical Activity: Sufficiently Active  . Days of Exercise per Week: 5 days  . Minutes of Exercise per Session: 30 min  Stress: No Stress Concern Present  . Feeling of Stress : Not at all  Social Connections:   . Frequency of Communication with Friends and Family: Not on file  . Frequency of Social Gatherings with Friends and Family: Not on file  . Attends Religious Services: Not on file  . Active Member of Clubs or Organizations: Not on file  . Attends  Archivist Meetings: Not on file  . Marital Status: Not on file  Intimate Partner Violence: Not At Risk  . Fear of Current or Ex-Partner: No  . Emotionally Abused: No  . Physically Abused: No  . Sexually Abused: No   Current Meds  Medication Sig  . acetaminophen-codeine (TYLENOL #3) 300-30 MG tablet Take 1 tablet by mouth every 4 (four) hours as needed for moderate pain.  Marland Kitchen apixaban (ELIQUIS) 2.5 MG TABS tablet Take 1 tablet (2.5 mg total) by mouth 2 (two) times daily.  . Cholecalciferol (VITAMIN D-3) 25 MCG (1000 UT) CAPS Take by mouth.  . diclofenac Sodium (VOLTAREN) 1 % GEL Apply topically 4 (four) times daily.  Marland Kitchen gabapentin (NEURONTIN) 400 MG capsule Take 1 capsule (400 mg total) by mouth 3 (three) times daily.  Marland Kitchen levothyroxine (SYNTHROID, LEVOTHROID) 100 MCG tablet Take 1 tablet (100 mcg total) by mouth daily before breakfast. (Patient taking differently: Take 112 mcg by mouth daily before breakfast. )  . lidocaine-prilocaine (EMLA) cream Apply 1 application topically as needed.  . lipase/protease/amylase (CREON) 36000 UNITS CPEP capsule TAKE 1 CAPSULE BY MOUTH THREE TIMES DAILY WITH MEALS  . loperamide (IMODIUM) 2 MG capsule Take 1 capsule (2 mg total) by mouth See admin instructions. With onset of loose stool, take 4mg  followed by 2mg  every 2 hours until 12 hours have passed without loose bowel movement. Maximum: 16 mg/day  . loratadine (CLARITIN) 10 MG tablet Take 10 mg by mouth daily.  Marland Kitchen omeprazole (PRILOSEC) 20 MG capsule Take 1 capsule by mouth 1 day or 1 dose.  . ondansetron (ZOFRAN) 4 MG tablet TAKE 1 TABLET BY MOUTH  EVERY 6 HOURS AS NEEDED FOR NAUSEA OR VOMITING.  . potassium chloride (KLOR-CON) 10 MEQ tablet TAKE 1 TABLET BY MOUTH  DAILY  . prochlorperazine (COMPAZINE) 10 MG tablet TAKE 1 TABLET BY MOUTH  EVERY 6 HOURS AS NEEDED   No Known Allergies Recent Results (from the past 2160 hour(s))  Cancer antigen 19-9     Status: None   Collection Time: 05/18/19  1:07  PM  Result Value Ref Range   CA 19-9 17 0 - 35 U/mL    Comment: (NOTE) Roche Diagnostics Electrochemiluminescence Immunoassay (ECLIA) Values obtained with different assay methods or kits cannot be used interchangeably.  Results cannot be interpreted as absolute evidence of the presence or absence of malignant disease. Performed At: [facility] [place], [place] [id number] Rush Farmer MD [id number]   Comprehensive metabolic panel     Status: Abnormal   Collection Time: 05/18/19  1:07 PM  Result Value Ref Range   Sodium 137 135 - 145 mmol/L   Potassium 3.8 3.5 - 5.1 mmol/L   Chloride 105 98 - 111 mmol/L   CO2 29 22 - 32 mmol/L   Glucose, Bld 85 70 - 99 mg/dL  BUN 18 8 - 23 mg/dL   Creatinine, Ser 0.67 0.44 - 1.00 mg/dL   Calcium 9.9 8.9 - 10.3 mg/dL   Total Protein 7.6 6.5 - 8.1 g/dL   Albumin 3.9 3.5 - 5.0 g/dL   AST 20 15 - 41 U/L   ALT 14 0 - 44 U/L   Alkaline Phosphatase 93 38 - 126 U/L   Total Bilirubin 0.6 0.3 - 1.2 mg/dL   GFR calc non Af Amer >60 >60 mL/min   GFR calc Af Amer >60 >60 mL/min   Anion gap 3 (L) 5 - 15    Comment: Performed at [facility], [address] [place], [place] [address]  CBC with Differential/Platelet     Status: Abnormal   Collection Time: 05/18/19  1:07 PM  Result Value Ref Range   WBC 6.6 4.0 - 10.5 K/uL   RBC 3.95 3.87 - 5.11 MIL/uL   Hemoglobin 10.7 (L) 12.0 - 15.0 g/dL   HCT 34.9 (L) 36.0 - 46.0 %   MCV 88.4 80.0 - 100.0 fL   MCH 27.1 26.0 - 34.0 pg   MCHC 30.7 30.0 - 36.0 g/dL   RDW 15.6 (H) 11.5 - 15.5 %   Platelets 175 150 - 400 K/uL   nRBC 0.0 0.0 - 0.2 %   Neutrophils Relative % 58 %   Neutro Abs 3.9 1.7 - 7.7 K/uL   Lymphocytes Relative 34 %   Lymphs Abs 2.2 0.7 - 4.0 K/uL   Monocytes Relative 7 %   Monocytes Absolute 0.4 0.1 - 1.0 K/uL   Eosinophils Relative 1 %   Eosinophils Absolute 0.0 0.0 - 0.5 K/uL   Basophils Relative 0 %   Basophils Absolute 0.0 0.0 - 0.1 K/uL   Immature  Granulocytes 0 %   Abs Immature Granulocytes 0.02 0.00 - 0.07 K/uL    Comment: Performed at [facility], [address] [place], [place] [address]   Objective  Body mass index is 27.24 kg/m. Wt Readings from Last 3 Encounters:  06/14/19 153 lb 12.8 oz (69.8 kg)  05/17/19 149 lb 11.2 oz (67.9 kg)  04/29/19 148 lb (67.1 kg)   Temp Readings from Last 3 Encounters:  06/14/19 (!) 97.2 F (36.2 C) (Temporal)  05/17/19 97.8 F (36.6 C)  02/24/19 (!) 96.9 F (36.1 C) (Tympanic)   BP Readings from Last 3 Encounters:  06/14/19 (!) 148/98  05/17/19 (!) 160/89  02/24/19 120/66   Pulse Readings from Last 3 Encounters:  06/14/19 73  05/17/19 90  02/24/19 77    Physical Exam Vitals and nursing note reviewed.  Constitutional:      Appearance: Normal appearance. She is well-developed, well-groomed and overweight.  HENT:     Head: Normocephalic and atraumatic.     Comments: +mask on   Eyes:     Conjunctiva/sclera: Conjunctivae normal.     Pupils: Pupils are equal, round, and reactive to light.  Cardiovascular:     Rate and Rhythm: Normal rate and regular rhythm.     Heart sounds: Normal heart sounds. No murmur.  Pulmonary:     Effort: Pulmonary effort is normal.     Breath sounds: Normal breath sounds.  Musculoskeletal:     Right lower leg: 1+ Pitting Edema present.     Left lower leg: 1+ Pitting Edema present.     Comments: Arthropathy vs sclerodactyly to hands R>L  Skin:    General: Skin is warm and dry.  Neurological:  General: No focal deficit present.     Mental Status: She is alert and oriented to person, place, and time. Mental status is at baseline.     Gait: Gait normal.  Psychiatric:        Attention and Perception: Attention and perception normal.        Mood and Affect: Mood normal.        Speech: Speech normal.        Behavior: Behavior normal. Behavior is cooperative.        Thought Content: Thought content normal.        Cognition and Memory:  Cognition and memory normal.        Judgment: Judgment normal.     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 Ronnald Collum increased dose 112 levo  Sclerodactyly - Plan: Anti-scleroderma antibody   HM Flu shot declines Prevnar, pna 23 declines  Tdap, shingrix declines   mammo referred due sch 07/26/19  Colonoscopy 12/02/16 sessile polyp and tubular adenoma [facility]  No pap due dexa 07/27/15 normal  rec centrum silver or nature made mvt with iron   Time spent 45-59 minutes  Provider: Dr. Olivia Mackie McLean-Scocuzza-Internal Medicine

## 2019-06-14 NOTE — Patient Instructions (Addendum)
Try centrum silver with iron for women or nature made multivitamin with iron over the counter daily with lunch or dinner   If hctz 12.5 in am if too much goal BP <130/<80 if reaching <90/<60 cut the pill in 1/2 dose  Drink enough water 50 to 55 ounces daily    Low-Sodium Eating Plan Sodium, which is an element that makes up salt, helps you maintain a healthy balance of fluids in your body. Too much sodium can increase your blood pressure and cause fluid and waste to be held in your body. Your health care provider or dietitian may recommend following this plan if you have high blood pressure (hypertension), kidney disease, liver disease, or heart failure. Eating less sodium can help lower your blood pressure, reduce swelling, and protect your heart, liver, and kidneys. What are tips for following this plan? General guidelines  Most people on this plan should limit their sodium intake to 1,500-2,000 mg (milligrams) of sodium each day. Reading food labels   The Nutrition Facts label lists the amount of sodium in one serving of the food. If you eat more than one serving, you must multiply the listed amount of sodium by the number of servings.  Choose foods with less than 140 mg of sodium per serving.  Avoid foods with 300 mg of sodium or more per serving. Shopping  Look for lower-sodium products, often labeled as "low-sodium" or "no salt added."  Always check the sodium content even if foods are labeled as "unsalted" or "no salt added".  Buy fresh foods. ? Avoid canned foods and premade or frozen meals. ? Avoid canned, cured, or processed meats  Buy breads that have less than 80 mg of sodium per slice. Cooking  Eat more home-cooked food and less restaurant, buffet, and fast food.  Avoid adding salt when cooking. Use salt-free seasonings or herbs instead of table salt or sea salt. Check with your health care provider or pharmacist before using salt substitutes.  Cook with  plant-based oils, such as canola, sunflower, or olive oil. Meal planning  When eating at a restaurant, ask that your food be prepared with less salt or no salt, if possible.  Avoid foods that contain MSG (monosodium glutamate). MSG is sometimes added to [place] food, bouillon, and some canned foods. What foods are recommended? The items listed may not be a complete list. Talk with your dietitian about what dietary choices are best for you. Grains Low-sodium cereals, including oats, puffed wheat and rice, and shredded wheat. Low-sodium crackers. Unsalted rice. Unsalted pasta. Low-sodium bread. Whole-grain breads and whole-grain pasta. Vegetables Fresh or frozen vegetables. "No salt added" canned vegetables. "No salt added" tomato sauce and paste. Low-sodium or reduced-sodium tomato and vegetable juice. Fruits Fresh, frozen, or canned fruit. Fruit juice. Meats and other protein foods Fresh or frozen (no salt added) meat, poultry, seafood, and fish. Low-sodium canned tuna and salmon. Unsalted nuts. Dried peas, beans, and lentils without added salt. Unsalted canned beans. Eggs. Unsalted nut butters. Dairy Milk. Soy milk. Cheese that is naturally low in sodium, such as ricotta cheese, fresh mozzarella, or Swiss cheese Low-sodium or reduced-sodium cheese. Cream cheese. Yogurt. Fats and oils Unsalted butter. Unsalted margarine with no trans fat. Vegetable oils such as canola or olive oils. Seasonings and other foods Fresh and dried herbs and spices. Salt-free seasonings. Low-sodium mustard and ketchup. Sodium-free salad dressing. Sodium-free light mayonnaise. Fresh or refrigerated horseradish. Lemon juice. Vinegar. Homemade, reduced-sodium, or low-sodium soups. Unsalted popcorn and pretzels. Low-salt or  salt-free chips. What foods are not recommended? The items listed may not be a complete list. Talk with your dietitian about what dietary choices are best for you. Grains Instant hot cereals. Bread  stuffing, pancake, and biscuit mixes. Croutons. Seasoned rice or pasta mixes. Noodle soup cups. Boxed or frozen macaroni and cheese. Regular salted crackers. Self-rising flour. Vegetables Sauerkraut, pickled vegetables, and relishes. Olives. [place] fries. Onion rings. Regular canned vegetables (not low-sodium or reduced-sodium). Regular canned tomato sauce and paste (not low-sodium or reduced-sodium). Regular tomato and vegetable juice (not low-sodium or reduced-sodium). Frozen vegetables in sauces. Meats and other protein foods Meat or fish that is salted, canned, smoked, spiced, or pickled. Bacon, ham, sausage, hotdogs, corned beef, chipped beef, packaged lunch meats, salt pork, jerky, pickled herring, anchovies, regular canned tuna, sardines, salted nuts. Dairy Processed cheese and cheese spreads. Cheese curds. Blue cheese. Feta cheese. String cheese. Regular cottage cheese. Buttermilk. Canned milk. Fats and oils Salted butter. Regular margarine. Ghee. Bacon fat. Seasonings and other foods Onion salt, garlic salt, seasoned salt, table salt, and sea salt. Canned and packaged gravies. Worcestershire sauce. Tartar sauce. Barbecue sauce. Teriyaki sauce. Soy sauce, including reduced-sodium. Steak sauce. Fish sauce. Oyster sauce. Cocktail sauce. Horseradish that you find on the shelf. Regular ketchup and mustard. Meat flavorings and tenderizers. Bouillon cubes. Hot sauce and Tabasco sauce. Premade or packaged marinades. Premade or packaged taco seasonings. Relishes. Regular salad dressings. Salsa. Potato and tortilla chips. Corn chips and puffs. Salted popcorn and pretzels. Canned or dried soups. Pizza. Frozen entrees and pot pies. Summary  Eating less sodium can help lower your blood pressure, reduce swelling, and protect your heart, liver, and kidneys.  Most people on this plan should limit their sodium intake to 1,500-2,000 mg (milligrams) of sodium each day.  Canned, boxed, and frozen foods are  high in sodium. Restaurant foods, fast foods, and pizza are also very high in sodium. You also get sodium by adding salt to food.  Try to cook at home, eat more fresh fruits and vegetables, and eat less fast food, canned, processed, or prepared foods. This information is not intended to replace advice given to you by your health care provider. Make sure you discuss any questions you have with your health care provider. Document Revised: 05/08/2017 Document Reviewed: 05/19/2016 Elsevier Patient Education  [place] DASH stands for "Dietary Approaches to Stop Hypertension." The DASH eating plan is a healthy eating plan that has been shown to reduce high blood pressure (hypertension). It may also reduce your risk for type 2 diabetes, heart disease, and stroke. The DASH eating plan may also help with weight loss. What are tips for following this plan?  General guidelines  Avoid eating more than 2,300 mg (milligrams) of salt (sodium) a day. If you have hypertension, you may need to reduce your sodium intake to 1,500 mg a day.  Limit alcohol intake to no more than 1 drink a day for nonpregnant women and 2 drinks a day for men. One drink equals 12 oz of beer, 5 oz of wine, or 1 oz of hard liquor.  Work with your health care provider to maintain a healthy body weight or to lose weight. Ask what an ideal weight is for you.  Get at least 30 minutes of exercise that causes your heart to beat faster (aerobic exercise) most days of the week. Activities may include walking, swimming, or biking.  Work with your health care provider or diet and nutrition  specialist (dietitian) to adjust your eating plan to your individual calorie needs. Reading food labels   Check food labels for the amount of sodium per serving. Choose foods with less than 5 percent of the Daily Value of sodium. Generally, foods with less than 300 mg of sodium per serving fit into this eating plan.  To find  whole grains, look for the word "whole" as the first word in the ingredient list. Shopping  Buy products labeled as "low-sodium" or "no salt added."  Buy fresh foods. Avoid canned foods and premade or frozen meals. Cooking  Avoid adding salt when cooking. Use salt-free seasonings or herbs instead of table salt or sea salt. Check with your health care provider or pharmacist before using salt substitutes.  Do not fry foods. Cook foods using healthy methods such as baking, boiling, grilling, and broiling instead.  Cook with heart-healthy oils, such as olive, canola, soybean, or sunflower oil. Meal planning  Eat a balanced diet that includes: ? 5 or more servings of fruits and vegetables each day. At each meal, try to fill half of your plate with fruits and vegetables. ? Up to 6-8 servings of whole grains each day. ? Less than 6 oz of lean meat, poultry, or fish each day. A 3-oz serving of meat is about the same size as a deck of cards. One egg equals 1 oz. ? 2 servings of low-fat dairy each day. ? A serving of nuts, seeds, or beans 5 times each week. ? Heart-healthy fats. Healthy fats called Omega-3 fatty acids are found in foods such as flaxseeds and coldwater fish, like sardines, salmon, and mackerel.  Limit how much you eat of the following: ? Canned or prepackaged foods. ? Food that is high in trans fat, such as fried foods. ? Food that is high in saturated fat, such as fatty meat. ? Sweets, desserts, sugary drinks, and other foods with added sugar. ? Full-fat dairy products.  Do not salt foods before eating.  Try to eat at least 2 vegetarian meals each week.  Eat more home-cooked food and less restaurant, buffet, and fast food.  When eating at a restaurant, ask that your food be prepared with less salt or no salt, if possible. What foods are recommended? The items listed may not be a complete list. Talk with your dietitian about what dietary choices are best for you. Grains  Whole-grain or whole-wheat bread. Whole-grain or whole-wheat pasta. Brown rice. [place]. Bulgur. Whole-grain and low-sodium cereals. Pita bread. Low-fat, low-sodium crackers. Whole-wheat flour tortillas. Vegetables Fresh or frozen vegetables (raw, steamed, roasted, or grilled). Low-sodium or reduced-sodium tomato and vegetable juice. Low-sodium or reduced-sodium tomato sauce and tomato paste. Low-sodium or reduced-sodium canned vegetables. Fruits All fresh, dried, or frozen fruit. Canned fruit in natural juice (without added sugar). Meat and other protein foods Skinless chicken or [place]. Ground chicken or [place]. Pork with fat trimmed off. Fish and seafood. Egg whites. Dried beans, peas, or lentils. Unsalted nuts, nut butters, and seeds. Unsalted canned beans. Lean cuts of beef with fat trimmed off. Low-sodium, lean deli meat. Dairy Low-fat (1%) or fat-free (skim) milk. Fat-free, low-fat, or reduced-fat cheeses. Nonfat, low-sodium ricotta or cottage cheese. Low-fat or nonfat yogurt. Low-fat, low-sodium cheese. Fats and oils Soft margarine without trans fats. Vegetable oil. Low-fat, reduced-fat, or light mayonnaise and salad dressings (reduced-sodium). Canola, safflower, olive, soybean, and sunflower oils. Avocado. Seasoning and other foods Herbs. Spices. Seasoning mixes without salt. Unsalted popcorn and pretzels. Fat-free sweets. What foods  are not recommended? The items listed may not be a complete list. Talk with your dietitian about what dietary choices are best for you. Grains Baked goods made with fat, such as croissants, muffins, or some breads. Dry pasta or rice meal packs. Vegetables Creamed or fried vegetables. Vegetables in a cheese sauce. Regular canned vegetables (not low-sodium or reduced-sodium). Regular canned tomato sauce and paste (not low-sodium or reduced-sodium). Regular tomato and vegetable juice (not low-sodium or reduced-sodium). Angie Fava. Olives. Fruits Canned  fruit in a light or heavy syrup. Fried fruit. Fruit in cream or butter sauce. Meat and other protein foods Fatty cuts of meat. Ribs. Fried meat. [place] Salines. Sausage. Bologna and other processed lunch meats. Salami. Fatback. Hotdogs. Bratwurst. Salted nuts and seeds. Canned beans with added salt. Canned or smoked fish. Whole eggs or egg yolks. Chicken or [place] with skin. Dairy Whole or 2% milk, cream, and half-and-half. Whole or full-fat cream cheese. Whole-fat or sweetened yogurt. Full-fat cheese. Nondairy creamers. Whipped toppings. Processed cheese and cheese spreads. Fats and oils Butter. Stick margarine. Lard. Shortening. Ghee. Bacon fat. Tropical oils, such as coconut, palm kernel, or palm oil. Seasoning and other foods Salted popcorn and pretzels. Onion salt, garlic salt, seasoned salt, table salt, and sea salt. Worcestershire sauce. Tartar sauce. Barbecue sauce. Teriyaki sauce. Soy sauce, including reduced-sodium. Steak sauce. Canned and packaged gravies. Fish sauce. Oyster sauce. Cocktail sauce. Horseradish that you find on the shelf. Ketchup. Mustard. Meat flavorings and tenderizers. Bouillon cubes. Hot sauce and Tabasco sauce. Premade or packaged marinades. Premade or packaged taco seasonings. Relishes. Regular salad dressings. Where to find more information:  National Heart, Lung, and [place]: https://wilson-eaton.com/  American Heart Association: www.heart.org Summary  The DASH eating plan is a healthy eating plan that has been shown to reduce high blood pressure (hypertension). It may also reduce your risk for type 2 diabetes, heart disease, and stroke.  With the DASH eating plan, you should limit salt (sodium) intake to 2,300 mg a day. If you have hypertension, you may need to reduce your sodium intake to 1,500 mg a day.  When on the DASH eating plan, aim to eat more fresh fruits and vegetables, whole grains, lean proteins, low-fat dairy, and heart-healthy fats.  Work with your health  care provider or diet and nutrition specialist (dietitian) to adjust your eating plan to your individual calorie needs. This information is not intended to replace advice given to you by your health care provider. Make sure you discuss any questions you have with your health care provider. Document Revised: 05/08/2017 Document Reviewed: 05/19/2016 Elsevier Patient Education  [place].  Hypertension, Adult High blood pressure (hypertension) is when the force of blood pumping through the arteries is too strong. The arteries are the blood vessels that carry blood from the heart throughout the body. Hypertension forces the heart to work harder to pump blood and may cause arteries to become narrow or stiff. Untreated or uncontrolled hypertension can cause a heart attack, heart failure, a stroke, kidney disease, and other problems. A blood pressure reading consists of a higher number over a lower number. Ideally, your blood pressure should be below 120/80. The first ("top") number is called the systolic pressure. It is a measure of the pressure in your arteries as your heart beats. The second ("bottom") number is called the diastolic pressure. It is a measure of the pressure in your arteries as the heart relaxes. What are the causes? The exact cause of this condition is not known.  There are some conditions that result in or are related to high blood pressure. What increases the risk? Some risk factors for high blood pressure are under your control. The following factors may make you more likely to develop this condition:  Smoking.  Having type 2 diabetes mellitus, high cholesterol, or both.  Not getting enough exercise or physical activity.  Being overweight.  Having too much fat, sugar, calories, or salt (sodium) in your diet.  Drinking too much alcohol. Some risk factors for high blood pressure may be difficult or impossible to change. Some of these factors include:  Having chronic  kidney disease.  Having a family history of high blood pressure.  Age. Risk increases with age.  Race. You may be at higher risk if you are African American.  Gender. Men are at higher risk than women before age 8. After age 59, women are at higher risk than men.  Having obstructive sleep apnea.  Stress. What are the signs or symptoms? High blood pressure may not cause symptoms. Very high blood pressure (hypertensive crisis) may cause:  Headache.  Anxiety.  Shortness of breath.  Nosebleed.  Nausea and vomiting.  Vision changes.  Severe chest pain.  Seizures. How is this diagnosed? This condition is diagnosed by measuring your blood pressure while you are seated, with your arm resting on a flat surface, your legs uncrossed, and your feet flat on the floor. The cuff of the blood pressure monitor will be placed directly against the skin of your upper arm at the level of your heart. It should be measured at least twice using the same arm. Certain conditions can cause a difference in blood pressure between your right and left arms. Certain factors can cause blood pressure readings to be lower or higher than normal for a short period of time:  When your blood pressure is higher when you are in a health care provider's office than when you are at home, this is called white coat hypertension. Most people with this condition do not need medicines.  When your blood pressure is higher at home than when you are in a health care provider's office, this is called masked hypertension. Most people with this condition may need medicines to control blood pressure. If you have a high blood pressure reading during one visit or you have normal blood pressure with other risk factors, you may be asked to:  Return on a different day to have your blood pressure checked again.  Monitor your blood pressure at home for 1 week or longer. If you are diagnosed with hypertension, you may have other blood  or imaging tests to help your health care provider understand your overall risk for other conditions. How is this treated? This condition is treated by making healthy lifestyle changes, such as eating healthy foods, exercising more, and reducing your alcohol intake. Your health care provider may prescribe medicine if lifestyle changes are not enough to get your blood pressure under control, and if:  Your systolic blood pressure is above 130.  Your diastolic blood pressure is above 80. Your personal target blood pressure may vary depending on your medical conditions, your age, and other factors. Follow these instructions at home: Eating and drinking   Eat a diet that is high in fiber and potassium, and low in sodium, added sugar, and fat. An example eating plan is called the DASH (Dietary Approaches to Stop Hypertension) diet. To eat this way: ? Eat plenty of fresh fruits and vegetables. Try  to fill one half of your plate at each meal with fruits and vegetables. ? Eat whole grains, such as whole-wheat pasta, brown rice, or whole-grain bread. Fill about one fourth of your plate with whole grains. ? Eat or drink low-fat dairy products, such as skim milk or low-fat yogurt. ? Avoid fatty cuts of meat, processed or cured meats, and poultry with skin. Fill about one fourth of your plate with lean proteins, such as fish, chicken without skin, beans, eggs, or tofu. ? Avoid pre-made and processed foods. These tend to be higher in sodium, added sugar, and fat.  Reduce your daily sodium intake. Most people with hypertension should eat less than 1,500 mg of sodium a day.  Do not drink alcohol if: ? Your health care provider tells you not to drink. ? You are pregnant, may be pregnant, or are planning to become pregnant.  If you drink alcohol: ? Limit how much you use to:  0-1 drink a day for women.  0-2 drinks a day for men. ? Be aware of how much alcohol is in your drink. In the [place] one drink  equals one 12 oz bottle of beer (355 mL), one 5 oz glass of wine (148 mL), or one 1 oz glass of hard liquor (44 mL). Lifestyle   Work with your health care provider to maintain a healthy body weight or to lose weight. Ask what an ideal weight is for you.  Get at least 30 minutes of exercise most days of the week. Activities may include walking, swimming, or biking.  Include exercise to strengthen your muscles (resistance exercise), such as Pilates or lifting weights, as part of your weekly exercise routine. Try to do these types of exercises for 30 minutes at least 3 days a week.  Do not use any products that contain nicotine or tobacco, such as cigarettes, e-cigarettes, and chewing tobacco. If you need help quitting, ask your health care provider.  Monitor your blood pressure at home as told by your health care provider.  Keep all follow-up visits as told by your health care provider. This is important. Medicines  Take over-the-counter and prescription medicines only as told by your health care provider. Follow directions carefully. Blood pressure medicines must be taken as prescribed.  Do not skip doses of blood pressure medicine. Doing this puts you at risk for problems and can make the medicine less effective.  Ask your health care provider about side effects or reactions to medicines that you should watch for. Contact a health care provider if you:  Think you are having a reaction to a medicine you are taking.  Have headaches that keep coming back (recurring).  Feel dizzy.  Have swelling in your ankles.  Have trouble with your vision. Get help right away if you:  Develop a severe headache or confusion.  Have unusual weakness or numbness.  Feel faint.  Have severe pain in your chest or abdomen.  Vomit repeatedly.  Have trouble breathing. Summary  Hypertension is when the force of blood pumping through your arteries is too strong. If this condition is not  controlled, it may put you at risk for serious complications.  Your personal target blood pressure may vary depending on your medical conditions, your age, and other factors. For most people, a normal blood pressure is less than 120/80.  Hypertension is treated with lifestyle changes, medicines, or a combination of both. Lifestyle changes include losing weight, eating a healthy, low-sodium diet, exercising more, and limiting  alcohol. This information is not intended to replace advice given to you by your health care provider. Make sure you discuss any questions you have with your health care provider. Document Revised: 02/03/2018 Document Reviewed: 02/03/2018 Elsevier Patient Education  2020 [employer].

## 2019-06-15 ENCOUNTER — Other Ambulatory Visit
Admission: RE | Admit: 2019-06-15 | Discharge: 2019-06-15 | Disposition: A | Discharge status: Home / Self Care | Payer: Medicare Other | Source: Ambulatory Visit | Attending: Internal Medicine | Admitting: Internal Medicine

## 2019-06-15 ENCOUNTER — Encounter: Payer: Self-pay | Admitting: Internal Medicine

## 2019-06-15 DIAGNOSIS — R739 Hyperglycemia, unspecified: Secondary | ICD-10-CM | POA: Diagnosis present

## 2019-06-15 DIAGNOSIS — Z13220 Encounter for screening for lipoid disorders: Secondary | ICD-10-CM | POA: Diagnosis present

## 2019-06-15 DIAGNOSIS — M19049 Primary osteoarthritis, unspecified hand: Secondary | ICD-10-CM | POA: Diagnosis present

## 2019-06-15 DIAGNOSIS — L943 Sclerodactyly: Secondary | ICD-10-CM | POA: Diagnosis present

## 2019-06-15 DIAGNOSIS — M79642 Pain in left hand: Secondary | ICD-10-CM | POA: Diagnosis present

## 2019-06-15 DIAGNOSIS — I10 Essential (primary) hypertension: Secondary | ICD-10-CM | POA: Diagnosis present

## 2019-06-15 DIAGNOSIS — E039 Hypothyroidism, unspecified: Secondary | ICD-10-CM | POA: Diagnosis present

## 2019-06-15 DIAGNOSIS — Z1389 Encounter for screening for other disorder: Secondary | ICD-10-CM | POA: Diagnosis present

## 2019-06-15 LAB — LIPID PANEL
Cholesterol: 219 mg/dL — ABNORMAL HIGH (ref 0–200)
HDL: 91 mg/dL (ref >40)
LDL Cholesterol: 113 mg/dL — ABNORMAL HIGH (ref 0–99)
Total CHOL/HDL Ratio: 2.4 RATIO
Triglycerides: 74 mg/dL (ref <150)
VLDL: 15 mg/dL (ref 0–40)

## 2019-06-15 LAB — URINALYSIS, ROUTINE W REFLEX MICROSCOPIC
Bacteria, UA: NONE SEEN
Bilirubin Urine: NEGATIVE
Glucose, UA: NEGATIVE mg/dL
Hgb urine dipstick: NEGATIVE
Ketones, ur: NEGATIVE mg/dL
Nitrite: NEGATIVE
Protein, ur: NEGATIVE mg/dL
Specific Gravity, Urine: 1.016 (ref 1.005–1.030)
pH: 5 (ref 5.0–8.0)

## 2019-06-15 LAB — FERRITIN: Ferritin: 66 ng/mL (ref 11–307)

## 2019-06-15 LAB — IRON AND TIBC
Iron: 69 ug/dL (ref 28–170)
Saturation Ratios: 17 % (ref 10.4–31.8)
TIBC: 400 ug/dL (ref 250–450)
UIBC: 331 ug/dL

## 2019-06-15 LAB — BASIC METABOLIC PANEL
Anion gap: 9 (ref 5–15)
BUN: 17 mg/dL (ref 8–23)
CO2: 25 mmol/L (ref 22–32)
Calcium: 10 mg/dL (ref 8.9–10.3)
Chloride: 103 mmol/L (ref 98–111)
Creatinine, Ser: 0.81 mg/dL (ref 0.44–1.00)
GFR calc Af Amer: >60 mL/min (ref >60)
GFR calc non Af Amer: >60 mL/min (ref >60)
Glucose, Bld: 92 mg/dL (ref 70–99)
Potassium: 4.1 mmol/L (ref 3.5–5.1)
Sodium: 137 mmol/L (ref 135–145)

## 2019-06-15 LAB — HEMOGLOBIN A1C
Hgb A1c MFr Bld: 5.3 % (ref 4.8–5.6)
Mean Plasma Glucose: 105.41 mg/dL

## 2019-06-15 LAB — C-REACTIVE PROTEIN: CRP: 0.5 mg/dL (ref <1.0)

## 2019-06-15 LAB — SEDIMENTATION RATE: Sed Rate: 45 mm/hr — ABNORMAL HIGH (ref 0–30)

## 2019-06-15 LAB — URIC ACID: Uric Acid, Serum: 5.2 mg/dL (ref 2.5–7.1)

## 2019-06-15 LAB — TSH: TSH: 1.409 u[IU]/mL (ref 0.350–4.500)

## 2019-06-15 NOTE — Addendum Note (Signed)
Addended by: Santiago Bur on: 06/15/2019 10:59 AM   Modules accepted: Orders

## 2019-06-15 NOTE — Addendum Note (Signed)
Addended by: Santiago Bur on: 06/15/2019 11:01 AM   Modules accepted: Orders

## 2019-06-15 NOTE — Addendum Note (Signed)
Addended by: Santiago Bur on: 06/15/2019 11:00 AM   Modules accepted: Orders

## 2019-06-16 ENCOUNTER — Ambulatory Visit
Admission: RE | Admit: 2019-06-16 | Discharge: 2019-06-16 | Disposition: A | Discharge status: Home / Self Care | Payer: Medicare Other | Source: Ambulatory Visit | Attending: Internal Medicine | Admitting: Internal Medicine

## 2019-06-16 ENCOUNTER — Other Ambulatory Visit: Payer: Self-pay

## 2019-06-16 DIAGNOSIS — M79641 Pain in right hand: Secondary | ICD-10-CM | POA: Diagnosis present

## 2019-06-16 DIAGNOSIS — M19049 Primary osteoarthritis, unspecified hand: Secondary | ICD-10-CM | POA: Diagnosis not present

## 2019-06-16 LAB — RHEUMATOID FACTOR: Rheumatoid fact SerPl-aCnc: 10.9 IU/mL (ref 0.0–13.9)

## 2019-06-16 LAB — ANTI-SCLERODERMA ANTIBODY: Scleroderma (Scl-70) (ENA) Antibody, IgG: <0.2 AI (ref 0.0–0.9)

## 2019-06-17 LAB — MISC LABCORP TEST (SEND OUT): Labcorp test code: 164914

## 2019-06-22 ENCOUNTER — Telehealth: Payer: Self-pay

## 2019-06-22 NOTE — Telephone Encounter (Signed)
Telephone call to patient to discuss SCP visit.   Patient in agreement for me to mail her the SCP packet and I will call her in 2 weeks 07/06/2019 around 2:00 to review packet and discuss post cancer care.  Packet mailed

## 2019-06-26 ENCOUNTER — Encounter: Payer: Self-pay | Admitting: Internal Medicine

## 2019-06-27 ENCOUNTER — Encounter: Payer: Self-pay | Admitting: Internal Medicine

## 2019-07-04 DIAGNOSIS — C250 Malignant neoplasm of head of pancreas: Secondary | ICD-10-CM

## 2019-07-05 ENCOUNTER — Encounter: Payer: Self-pay | Admitting: Internal Medicine

## 2019-07-06 ENCOUNTER — Inpatient Hospital Stay: Payer: Medicare Other | Attending: Oncology | Admitting: Oncology

## 2019-07-06 DIAGNOSIS — C250 Malignant neoplasm of head of pancreas: Secondary | ICD-10-CM

## 2019-07-06 NOTE — Progress Notes (Signed)
Virtual Visit Progress Note  Survivorship Clinic Consult note [facility]  [phone] [phone]  Patient Care Team: McLean-Scocuzza, Nino Glow, MD as PCP - General (Internal Medicine) Clent Jacks, RN as Registered Nurse Jonathon Bellows, MD as Consulting Physician (Gastroenterology) Clent Jacks, RN as Oncology Nurse Navigator Earlie Server, MD as Consulting Physician (Oncology) Fayrene Helper Stacie Acres, MD as Referring Physician (Oncology) Algernon Huxley, MD as Referring Physician (Vascular Surgery)   Name of the patient: Lynn Taylor  [id number]  05/10/1946   Date of visit: 07/06/19  CLINIC:  Survivorship  I connected with Lessie Dings on 07/06/19 at  2:00 PM EST by telephone visit and verified that I am speaking with the correct person using two identifiers.   I discussed the limitations, risks, security and privacy concerns of performing an evaluation and management service by telemedicine and the availability of in-person appointments. I also discussed with the patient that there may be a patient responsible charge related to this service. The patient expressed understanding and agreed to proceed.   Other persons participating in the visit and their role in the encounter: Magdalene Patricia, RN (review Survivorship Care Plan)  Patient's location: home Provider's location: clinic  Chief Complaint: Review Survivorship Care Plan and address acute survivorship needs  REASON FOR VISIT:  Survivorship Care Plan visit & to address acute survivorship needs   BRIEF ONCOLOGY HISTORY: Oncology History Overview Note  CECILIE HEIDEL is a  74 y.o.  female with PMH listed below who was referred to me for evaluation of evaluation of elevated ferritin. Patient recently had lab work-up done on 04/09/2018 which showed ferritin level 1877, iron 97, TIBC 340, iron saturation 29, 04/11/2018 CBC showed hemoglobin 13.2, MCV 81, WBC 7.6, platelet counts  246,000. Patient was referred to hematology for further evaluation of elevated ferritin.   #04/28/2018 Ultrasound of liver was obtained which showed CBD and intrahepatic biliary dilatation Patient wa she is s advised to go to emergency room for further evaluation. Also had hyperbilirubinemia.  Patient was complaining about being jaundiced, pruritus all over. MR abdomen MRCP with and without contrast showed market biliary duct dilatation, secondary to obstruction in the region of pancreatic head.  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 No abdominal adenopathy, liver metastasis or cross vascular involvement.  patient was evaluated by gastroenterology and status post ERCP with stenting. CA 19.9 1173 CEA 3.9 # 05/25/2018  patient was referred to [facility] and s/p whipple resection.Her postop course was c/b Type A pancreatic leak and c.diff. Pathology showed A. Hepatic artery lymph node, excision: One lymph node, negative for malignancy (0/1). B. Biliary stent, removal: Medical device, consistent with stent, gross examination only. C. 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 All margins are negative (closest margin=uncinate, 0.5 mm) Metastatic adenocarcinoma in one of thirty-two lymph nodes (1/32).  Portion of stomach and duodenum with no specific pathologic diagnosis.   Grade, 3 poorly differentiated.  Pathologic pT2 pN1   Cancer Treatment 07/13/2018 Cycle 1 FOLFIRINOX with Onpro Neulasta.  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 10/06/2018 cycle 6  mFOLFIRINOX with Onpro Neulasta - [50% dose for Irinotecan- 5-FU bolus omitted]. 10/20/2018 cycle 7 mFOLFIRINOX with Onpro Neulasta - [50% dose for Irinotecan- 5-FU bolus omitted].   INTERVAL HISTORY PANAYIOTA LARKIN is a 74 y.o. female with history of stage IIb pancreatic cancer presents to for follow-up.     #Patient continues to improve.  Appetite is good.  She has gained weight during the interval. 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  She takes gabapentin. No additional loose bowel movement episodes.  She takes cryo-. Patient is on Eliquis 5 mg twice daily for DVT treatments.  Lower extremity swelling has largely improved and resolved.  mild edema.   Denies any fever, chills, nausea, vomiting, abdominal pain.   Malignant neoplasm of head of pancreas ([place])  06/08/2018 Initial Diagnosis   Malignant neoplasm of head of pancreas ([place])   07/13/2018 - 11/23/2018 Chemotherapy   The patient had palonosetron (ALOXI) injection 0.25 mg, 0.25 mg, Intravenous,  Once, 7 of 7 cycles Administration: 0.25 mg (07/13/2018), 0.25 mg (09/22/2018), 0.25 mg (10/06/2018), 0.25 mg (10/20/2018), 0.25 mg (08/03/2018), 0.25 mg (08/18/2018), 0.25 mg (09/01/2018) pegfilgrastim (NEULASTA ONPRO KIT) injection 6 mg, 6 mg, Subcutaneous, Once, 7 of 7 cycles Administration: 6 mg (07/15/2018), 6 mg (08/05/2018), 6 mg (09/24/2018), 6 mg (10/08/2018), 6 mg (10/22/2018), 6 mg (08/20/2018), 6 mg (09/03/2018) irinotecan (CAMPTOSAR) 340 mg in dextrose 5 % 500 mL chemo infusion, 180 mg/m2 = 340 mg, Intravenous,  Once, 7 of 7 cycles Dose modification: 65 mg/m2 (original dose 180 mg/m2, Cycle 5, Reason: Dose not tolerated), 65 mg/m2 (original dose 180 mg/m2, Cycle 2, Reason: Dose not tolerated), 65 mg/m2  (original dose 180 mg/m2, Cycle 3, Reason: Dose not tolerated), 65 mg/m2 (original dose 180 mg/m2, Cycle 4, Reason: Dose not tolerated) Administration: 340 mg (07/13/2018), 120 mg (10/06/2018), 100 mg (10/20/2018), 120 mg (08/03/2018), 120 mg (08/18/2018), 120 mg (09/01/2018) leucovorin 750 mg in dextrose 5 % 250 mL infusion, 752 mg, Intravenous,  Once, 7 of 7 cycles Administration: 750 mg (07/13/2018), 650 mg (10/06/2018), 650 mg (10/20/2018), 750 mg (08/03/2018), 750 mg (08/18/2018), 750 mg (09/01/2018) oxaliplatin (ELOXATIN) 150 mg in dextrose 5 % 500 mL chemo infusion, 160 mg, Intravenous,  Once, 7 of 7 cycles Dose modification: 80 mg/m2 (original dose 85 mg/m2, Cycle 5, Reason: Provider Judgment), 75 mg/m2 (original dose 85 mg/m2, Cycle 2, Reason: Dose not tolerated), 80 mg/m2 (original dose 85 mg/m2, Cycle 4, Reason: Dose not tolerated) Administration: 150 mg (07/13/2018), 135 mg (09/22/2018), 135 mg (10/06/2018), 135 mg (10/20/2018), 135 mg (08/03/2018), 150 mg (08/18/2018), 150 mg (09/01/2018) fluorouracil (ADRUCIL) chemo injection 750 mg, 400 mg/m2 = 750 mg, Intravenous,  Once, 4 of 4 cycles Dose modification: 200 mg/m2 (original dose 400 mg/m2, Cycle 2, Reason: Dose not tolerated) Administration: 750 mg (07/13/2018), 350 mg (08/03/2018), 750 mg (08/18/2018), 700 mg (09/01/2018) fluorouracil (ADRUCIL) 4,500 mg in sodium chloride 0.9 % 60 mL chemo infusion, 2,400 mg/m2 = 4,500 mg, Intravenous, 1 Day/Dose, 7 of 7 cycles Dose modification: 1,200 mg/m2 (original dose 2,400 mg/m2, Cycle 2, Reason: Dose not tolerated) Administration: 4,500 mg (07/13/2018), 4,150 mg (09/22/2018), 4,150 mg (10/06/2018), 4,150 mg (10/20/2018), 2,250 mg (08/03/2018), 4,500 mg (08/18/2018), 4,150 mg (09/01/2018)  for chemotherapy treatment.    12/29/2018 -  Chemotherapy   The patient had gemcitabine (GEMZAR) 1,254 mg in sodium chloride 0.9 % 100 mL chemo infusion,  800 mg/m2 = 1,254 mg (original dose ), Intravenous,  Once, 0 of 4 cycles Dose  modification: 800 mg/m2 (Cycle 1, Reason: Provider Judgment)  for chemotherapy treatment.     INTERVAL HISTORY: Patient presents to the survivorship clinic today for initial meeting to review survivorship care plan detailing treatment course for pancreatic cancer, as well as monitoring long-term side effects of that treatment, education regarding health maintenance, screening, and overall wellness and health promotion.  Overall, she reports feeling well since completing treatment and offers no specific complaints today.  Past medical, surgical, social history were reviewed and updated as below.  We also updated patient's current medications and allergies.  ADDITIONAL REVIEW OF SYSTEMS:  Review of Systems  Constitutional: Negative.  Negative for chills, fever, malaise/fatigue and weight loss.  HENT: Negative for congestion, ear pain and tinnitus.   Eyes: Negative.  Negative for blurred vision and double vision.  Respiratory: Negative.  Negative for cough, sputum production and shortness of breath.   Cardiovascular: Negative.  Negative for chest pain, palpitations and leg swelling.  Gastrointestinal: Negative.  Negative for abdominal pain, constipation, diarrhea, nausea and vomiting.  Genitourinary: Negative for dysuria, frequency and urgency.  Musculoskeletal: Negative for back pain and falls.  Skin: Negative.  Negative for rash.  Neurological: Positive for sensory change (neuropathy in feet ). Negative for weakness and headaches.  Endo/Heme/Allergies: Negative.  Does not bruise/bleed easily.  Psychiatric/Behavioral: Negative.  Negative for depression. The patient is not nervous/anxious and does not have insomnia.     PAST MEDICAL & SURGICAL HISTORY:  Past Medical History:  Diagnosis Date  . Anemia due to antineoplastic chemotherapy 11/25/2018  . Cancer ([place])   . GERD (gastroesophageal reflux disease)   . Hypertension   . Hypomagnesemia 11/25/2018  . Hypothyroidism   . Malignant  neoplasm of head of pancreas ([place]) 06/08/2018   Past Surgical History:  Procedure Laterality Date  . CHOLECYSTECTOMY    . COLONOSCOPY N/A 12/02/2016   Procedure: COLONOSCOPY;  Surgeon: Lollie Sails, MD;  Location: [facility] ENDOSCOPY;  Service: Endoscopy;  Laterality: N/A;  . ERCP N/A 04/30/2018   Procedure: ENDOSCOPIC RETROGRADE CHOLANGIOPANCREATOGRAPHY (ERCP);  Surgeon: Lucilla Lame, MD;  Location: [facility] ENDOSCOPY;  Service: Endoscopy;  Laterality: N/A;  . ERCP N/A 05/05/2018   Procedure: ENDOSCOPIC RETROGRADE CHOLANGIOPANCREATOGRAPHY (ERCP);  Surgeon: Lucilla Lame, MD;  Location: [facility] ENDOSCOPY;  Service: Endoscopy;  Laterality: N/A;  . PORTA CATH INSERTION N/A 06/28/2018   Procedure: PORTA CATH INSERTION;  Surgeon: Algernon Huxley, MD;  Location: [place] CV LAB;  Service: Cardiovascular;  Laterality: N/A;    SOCIAL HISTORY:   CURRENT MEDICATIONS:  Current Outpatient Medications on File Prior to Visit  Medication Sig Dispense Refill  . acetaminophen-codeine (TYLENOL #3) 300-30 MG tablet Take 1 tablet by mouth every 4 (four) hours as needed for moderate pain. 20 tablet 0  . apixaban (ELIQUIS) 2.5 MG TABS tablet Take 1 tablet (2.5 mg total) by mouth 2 (two) times daily. 60 tablet 3  . Cholecalciferol (VITAMIN D-3) 25 MCG (1000 UT) CAPS Take by mouth.    . diclofenac Sodium (VOLTAREN) 1 % GEL Apply topically 4 (four) times daily.    Marland Kitchen gabapentin (NEURONTIN) 400 MG capsule Take 1 capsule (400 mg total) by mouth 3 (three) times daily. 270 capsule 1  . hydrochlorothiazide (HYDRODIURIL) 12.5 MG tablet Take 1 tablet (12.5 mg total) by mouth daily. In am 90 tablet 3  . levothyroxine (SYNTHROID) 112 MCG tablet Take 1 tablet (112 mcg total) by mouth  daily before breakfast. 90 tablet 3  . lidocaine-prilocaine (EMLA) cream Apply 1 application topically as needed. 30 g 3  . lipase/protease/amylase (CREON) 36000 UNITS CPEP capsule TAKE 1 CAPSULE BY MOUTH THREE TIMES DAILY WITH MEALS 90 capsule 3  .  loperamide (IMODIUM) 2 MG capsule Take 1 capsule (2 mg total) by mouth See admin instructions. With onset of loose stool, take 85m followed by 238mevery 2 hours until 12 hours have passed without loose bowel movement. Maximum: 16 mg/day 120 capsule 1  . loratadine (CLARITIN) 10 MG tablet Take 10 mg by mouth daily.    . Marland Kitchenmeprazole (PRILOSEC) 20 MG capsule Take 1 capsule by mouth 1 day or 1 dose.    . ondansetron (ZOFRAN) 4 MG tablet TAKE 1 TABLET BY MOUTH  EVERY 6 HOURS AS NEEDED FOR NAUSEA OR VOMITING. 180 tablet 1  . potassium chloride (KLOR-CON) 10 MEQ tablet TAKE 1 TABLET BY MOUTH  DAILY 90 tablet 0  . prochlorperazine (COMPAZINE) 10 MG tablet TAKE 1 TABLET BY MOUTH  EVERY 6 HOURS AS NEEDED 90 tablet 0   No current facility-administered medications on file prior to visit.    ALLERGIES: No Known Allergies   PHYSICAL EXAM:  There were no vitals filed for this visit. There were no vitals filed for this visit.  Exam limited due to telemedicine  General: well appearing patient in in no acute distress.  Respiratory: Breathing non-labored.    Neuro: Alert and oriented Psych: Normal mood and affect for situation.  LABORATORY DATA:  Lab Results  Component Value Date   WBC 6.6 05/18/2019   HGB 10.7 (L) 05/18/2019   HCT 34.9 (L) 05/18/2019   MCV 88.4 05/18/2019   PLT 175 05/18/2019     Chemistry      Component Value Date/Time   NA 137 06/15/2019 1125   NA 137 04/21/2018 1046   K 4.1 06/15/2019 1125   CL 103 06/15/2019 1125   CO2 25 06/15/2019 1125   BUN 17 06/15/2019 1125   BUN 23 04/21/2018 1046   CREATININE 0.81 06/15/2019 1125      Component Value Date/Time   CALCIUM 10.0 06/15/2019 1125   ALKPHOS 93 05/18/2019 1307   AST 20 05/18/2019 1307   ALT 14 05/18/2019 1307   BILITOT 0.6 05/18/2019 1307   BILITOT 6.2 (H) 05/10/2018 1429      DIAGNOSTIC IMAGING:  [facility] were reassuring and without evidence of recurrence.  She was  scheduled for repeat imaging in approximately 3 months.  ASSESSMENT & PLAN:  Ms. LaDeveras a pleasant [age].o. female/female with history of malignant neoplasm of head of pancreas.  She is status post Whipple surgery back in December 2018 with negative margins.  She received 7 of 12 cycles of FOLFIRINOX.  Treatment was discontinued secondary to intolerable side effects.  Imaging from October 2020 and most recently on July 04, 2019 including CA 19-9 were stable. Patient presents to survivorship clinic today for survivorship care plan visit and to address any acute survivorship concerns since completing treatment.   1.  Stage IIa pancreatic cancer: Today, [facility] which was reviewed with him in detail.  The SCP details his cancer treatment history and potential late/long-term side effects of those treatments.  We discussed the follow-up schedule he can anticipate with interval imaging for surveillance of his cancer.  I have also shared a copy of his treatment  summary/SCP with his PCP.  We discussed surveillance of patients with resected disease.  NCCN guidelines recommends history and physical examination for symptom assessment every 3 to 6 months for 2 years, then every 6 to 12 months as clinically indicated.  CA 19-9 determinations and follow-up CT scans chest abdomen and pelvis with contrast every 3 to 6 months for 2 years after surgical resection are category 2B recommendations, because data are not available decided earlier treatments of recurrence, following detection by increased tumor markers levels or CT scan, leads to better patient outcomes.  In fact, and analysis of the SEER Medicare database shows no significant survival benefit for patients who received regular surveillance CT scans.  Patient had Whipple at [facility].  Patient has appropriate appointments at [facility].  Her next appointment is scheduled for 10/03/2019 for a port flush, CT scan and MD  assessment with Dr. Fayrene Helper.  She does not have follow-up with Dr. Tasia Catchings at this time.  #. Problem at visit: Neuropathy: followed by PCP. Appears to be improving.  She also states her energy levels continue to improve.  Her appetite has significantly improved with greater than a 20 pound weight gain over the past 6 months.  She is attempting to increase her activity levels and exercise more.  #. Smoking cessation: I commended continued efforts to remain tobacco-free.  We discussed that one of the most important risk reduction strategies in preventing cancer recurrence is smoking cessation.  Patient is committed to abstaining from tobacco.    #. Physical activity/Healthy eating: Getting adequate physical activity and maintaining a healthy diet as a cancer survivor is important for overall wellness and reduces the risk of cancer recurrence. We discussed the CARE program, which is a fitness program that is offered to cancer survivors free of charge.  We also reviewed "The Nutrition Rainbow" handout, as well as the American Cancer Society's booklet with recommendations for nutrition and physical activity.    #. Health promotion/Cancer screening:  Ms. Baskin is reportedly up-to-date on her cancer screening tests and vaccinations. I encouraged her to talk with PCP about arranging appropriate cancer screening tests, as appropriate.   #. Support services/Counseling: It is not uncommon for this period of the patient's cancer care trajectory to be one of many emotions and stressors.  Ms. Eynon was encouraged to take advantage of our many support services programs, support groups, and/or counseling in coping with her new life as a cancer survivor after completing anti-cancer treatment. We specifically discussed the Cancer Transitions program. He was given a calendar of the cancer center's support services events, as well as brochures for our spiritual care and free counseling resources.    Disposition/Return to clinic   -Return to survivorship clinic as needed; no additional follow-up needed at this time.  -Consider transitioning the patient to long-term survivorship, when clinically appropriate.    I discussed the assessment and treatment plan with the patient. The patient was provided an opportunity to ask questions and all were answered. The patient agreed with the plan and demonstrated an understanding of the instructions.   The patient was advised to call back or seek an in-person evaluation if the symptoms worsen or if the condition fails to improve as anticipated.   I provided 25 minutes of non face-to-face telephone visit time during this encounter, and > 50% was spent counseling as documented under my assessment & plan.  A total of 25 minutes was spent in face-to-face care of this patient, with greater than 50% of that  time spent in counseling and care coordination.   Rulon Abide, NP, AGNP-C [place] [facility] [phone] (clinic)  CC: Dr. Tasia Catchings

## 2019-07-06 NOTE — Progress Notes (Signed)
Survivorship Care Plan visit completed.  Treatment summary reviewed and previously mailed to patient.  ASCO answers booklet reviewed and previously mailed to patient.  CARE program and Cancer Transitions discussed with patient along with other resources cancer center offers to patients and caregivers.  Patient verbalized understanding.  Patient states not eating healthy and we reviewed good dietary habits.   Also encouraged patient to become one of our [facility] mentors.   Patient is interested.

## 2019-07-13 ENCOUNTER — Inpatient Hospital Stay: Payer: Medicare Other | Attending: Oncology

## 2019-07-13 ENCOUNTER — Other Ambulatory Visit: Payer: Self-pay

## 2019-07-13 DIAGNOSIS — Z452 Encounter for adjustment and management of vascular access device: Secondary | ICD-10-CM | POA: Insufficient documentation

## 2019-07-13 DIAGNOSIS — C250 Malignant neoplasm of head of pancreas: Secondary | ICD-10-CM | POA: Insufficient documentation

## 2019-07-13 DIAGNOSIS — Z95828 Presence of other vascular implants and grafts: Secondary | ICD-10-CM

## 2019-07-13 DIAGNOSIS — Z9221 Personal history of antineoplastic chemotherapy: Secondary | ICD-10-CM | POA: Insufficient documentation

## 2019-07-13 MED ORDER — SODIUM CHLORIDE 0.9% FLUSH
10.0000 mL | Freq: Once | INTRAVENOUS | Status: AC
  Administered 2019-07-13: 10 mL via INTRAVENOUS
  Filled 2019-07-13: qty 10

## 2019-07-13 MED ORDER — HEPARIN SOD (PORK) LOCK FLUSH 100 UNIT/ML IV SOLN
500.0000 [IU] | Freq: Once | INTRAVENOUS | Status: AC
  Administered 2019-07-13: 500 [IU] via INTRAVENOUS
  Filled 2019-07-13: qty 5

## 2019-07-19 ENCOUNTER — Encounter: Payer: Self-pay | Admitting: Internal Medicine

## 2019-07-26 ENCOUNTER — Ambulatory Visit
Admission: RE | Admit: 2019-07-26 | Discharge: 2019-07-26 | Disposition: A | Discharge status: Home / Self Care | Payer: Medicare Other | Source: Ambulatory Visit | Attending: Internal Medicine | Admitting: Internal Medicine

## 2019-07-26 DIAGNOSIS — Z1231 Encounter for screening mammogram for malignant neoplasm of breast: Secondary | ICD-10-CM | POA: Diagnosis present

## 2019-08-02 ENCOUNTER — Other Ambulatory Visit (INDEPENDENT_AMBULATORY_CARE_PROVIDER_SITE_OTHER): Payer: Medicare Other

## 2019-08-02 ENCOUNTER — Other Ambulatory Visit: Payer: Self-pay

## 2019-08-02 DIAGNOSIS — I10 Essential (primary) hypertension: Secondary | ICD-10-CM

## 2019-08-02 DIAGNOSIS — M2550 Pain in unspecified joint: Secondary | ICD-10-CM | POA: Diagnosis not present

## 2019-08-02 DIAGNOSIS — M24549 Contracture, unspecified hand: Secondary | ICD-10-CM | POA: Diagnosis not present

## 2019-08-02 DIAGNOSIS — M898X9 Other specified disorders of bone, unspecified site: Secondary | ICD-10-CM

## 2019-08-02 DIAGNOSIS — D649 Anemia, unspecified: Secondary | ICD-10-CM

## 2019-08-02 LAB — SEDIMENTATION RATE: Sed Rate: 75 mm/hr — ABNORMAL HIGH (ref 0–30)

## 2019-08-02 LAB — BASIC METABOLIC PANEL
BUN: 19 mg/dL (ref 6–23)
CO2: 24 mEq/L (ref 19–32)
Calcium: 10.4 mg/dL (ref 8.4–10.5)
Chloride: 103 mEq/L (ref 96–112)
Creatinine, Ser: 0.78 mg/dL (ref 0.40–1.20)
GFR: 87.49 mL/min (ref >60.00)
Glucose, Bld: 94 mg/dL (ref 70–99)
Potassium: 3.7 mEq/L (ref 3.5–5.1)
Sodium: 135 mEq/L (ref 135–145)

## 2019-08-02 LAB — C-REACTIVE PROTEIN: CRP: <1 mg/dL (ref 0.5–20.0)

## 2019-08-03 LAB — CYCLIC CITRUL PEPTIDE ANTIBODY, IGG: Cyclic Citrullin Peptide Ab: <16 UNITS

## 2019-08-03 LAB — URINALYSIS, ROUTINE W REFLEX MICROSCOPIC
Bacteria, UA: NONE SEEN /HPF
Bilirubin Urine: NEGATIVE
Glucose, UA: NEGATIVE
Hgb urine dipstick: NEGATIVE
Hyaline Cast: NONE SEEN /LPF
Ketones, ur: NEGATIVE
Nitrite: NEGATIVE
Protein, ur: NEGATIVE
Specific Gravity, Urine: 1.017 (ref 1.001–1.03)
pH: <=5 (ref 5.0–8.0)

## 2019-08-03 LAB — IRON,TIBC AND FERRITIN PANEL
%SAT: 29 % (calc) (ref 16–45)
Ferritin: 122 ng/mL (ref 16–288)
Iron: 96 ug/dL (ref 45–160)
TIBC: 332 mcg/dL (calc) (ref 250–450)

## 2019-08-03 LAB — RHEUMATOID FACTOR: Rheumatoid fact SerPl-aCnc: <14 IU/mL (ref <14)

## 2019-08-14 ENCOUNTER — Encounter: Payer: Self-pay | Admitting: Internal Medicine

## 2019-08-14 DIAGNOSIS — I10 Essential (primary) hypertension: Secondary | ICD-10-CM

## 2019-08-15 MED ORDER — HYDROCHLOROTHIAZIDE 12.5 MG PO TABS: 12.5000 mg | ORAL_TABLET | Freq: Every day | ORAL | 3 refills | Status: DC

## 2019-08-16 ENCOUNTER — Other Ambulatory Visit: Payer: Self-pay | Admitting: Nurse Practitioner

## 2019-09-07 ENCOUNTER — Inpatient Hospital Stay: Payer: Medicare Other | Attending: Oncology

## 2019-09-10 ENCOUNTER — Encounter: Payer: Self-pay | Admitting: Internal Medicine

## 2019-09-11 ENCOUNTER — Other Ambulatory Visit: Payer: Self-pay | Admitting: Internal Medicine

## 2019-09-11 DIAGNOSIS — G629 Polyneuropathy, unspecified: Secondary | ICD-10-CM

## 2019-09-11 MED ORDER — GABAPENTIN 400 MG PO CAPS: 400.0000 mg | ORAL_CAPSULE | Freq: Three times a day (TID) | ORAL | 3 refills | Status: DC

## 2019-09-12 ENCOUNTER — Other Ambulatory Visit: Payer: Self-pay

## 2019-09-12 ENCOUNTER — Inpatient Hospital Stay: Payer: Medicare Other | Attending: Oncology

## 2019-09-12 DIAGNOSIS — Z95828 Presence of other vascular implants and grafts: Secondary | ICD-10-CM | POA: Insufficient documentation

## 2019-09-12 DIAGNOSIS — C250 Malignant neoplasm of head of pancreas: Secondary | ICD-10-CM | POA: Insufficient documentation

## 2019-09-12 DIAGNOSIS — Z9221 Personal history of antineoplastic chemotherapy: Secondary | ICD-10-CM | POA: Insufficient documentation

## 2019-09-12 DIAGNOSIS — Z452 Encounter for adjustment and management of vascular access device: Secondary | ICD-10-CM | POA: Diagnosis not present

## 2019-09-12 MED ORDER — HEPARIN SOD (PORK) LOCK FLUSH 100 UNIT/ML IV SOLN
500.0000 [IU] | Freq: Once | INTRAVENOUS | Status: AC
  Administered 2019-09-12: 14:00:00 500 [IU] via INTRAVENOUS
  Filled 2019-09-12: qty 5

## 2019-09-12 MED ORDER — SODIUM CHLORIDE 0.9% FLUSH
10.0000 mL | Freq: Once | INTRAVENOUS | Status: AC
  Administered 2019-09-12: 10 mL via INTRAVENOUS
  Filled 2019-09-12: qty 10

## 2019-09-16 ENCOUNTER — Other Ambulatory Visit: Payer: Self-pay | Admitting: Internal Medicine

## 2019-09-16 MED ORDER — POTASSIUM CHLORIDE CRYS ER 10 MEQ PO TBCR: 10.0000 meq | EXTENDED_RELEASE_TABLET | Freq: Every day | ORAL | 3 refills | Status: DC

## 2019-09-21 ENCOUNTER — Other Ambulatory Visit: Payer: Self-pay

## 2019-09-21 ENCOUNTER — Encounter: Payer: Self-pay | Admitting: Internal Medicine

## 2019-09-21 ENCOUNTER — Ambulatory Visit (INDEPENDENT_AMBULATORY_CARE_PROVIDER_SITE_OTHER): Payer: Medicare Other | Admitting: Internal Medicine

## 2019-09-21 ENCOUNTER — Telehealth: Payer: Self-pay | Admitting: Internal Medicine

## 2019-09-21 VITALS — BP 134/88 | HR 70 | Temp 97.2°F | Ht 63.0 in | Wt 156.0 lb

## 2019-09-21 DIAGNOSIS — I10 Essential (primary) hypertension: Secondary | ICD-10-CM

## 2019-09-21 DIAGNOSIS — L700 Acne vulgaris: Secondary | ICD-10-CM

## 2019-09-21 DIAGNOSIS — M1990 Unspecified osteoarthritis, unspecified site: Secondary | ICD-10-CM | POA: Insufficient documentation

## 2019-09-21 NOTE — Patient Instructions (Addendum)
Nature wise Tumeric/ginger/Curcumin supplement or Petra Kuba Made    Voltaren gel over the counter   Hand Exercises Hand exercises can be helpful for almost anyone. These exercises can strengthen the hands, improve flexibility and movement, and increase blood flow to the hands. These results can make work and daily tasks easier. Hand exercises can be especially helpful for people who have joint pain from arthritis or have nerve damage from overuse (carpal tunnel syndrome). These exercises can also help people who have injured a hand. Exercises Most of these hand exercises are gentle stretching and motion exercises. It is usually safe to do them often throughout the day. Warming up your hands before exercise may help to reduce stiffness. You can do this with gentle massage or by placing your hands in warm water for 10-15 minutes. It is normal to feel some stretching, pulling, tightness, or mild discomfort as you begin new exercises. This will gradually improve. Stop an exercise right away if you feel sudden, severe pain or your pain gets worse. Ask your health care provider which exercises are best for you. Knuckle bend or "claw" fist 1. Stand or sit with your arm, hand, and all five fingers pointed straight up. Make sure to keep your wrist straight during the exercise. 2. Gently bend your fingers down toward your palm until the tips of your fingers are touching the top of your palm. Keep your big knuckle straight and just bend the small knuckles in your fingers. 3. Hold this position for __________ seconds. 4. Straighten (extend) your fingers back to the starting position. Repeat this exercise 5-10 times with each hand. Full finger fist 1. Stand or sit with your arm, hand, and all five fingers pointed straight up. Make sure to keep your wrist straight during the exercise. 2. Gently bend your fingers into your palm until the tips of your fingers are touching the middle of your palm. 3. Hold this  position for __________ seconds. 4. Extend your fingers back to the starting position, stretching every joint fully. Repeat this exercise 5-10 times with each hand. Straight fist 1. Stand or sit with your arm, hand, and all five fingers pointed straight up. Make sure to keep your wrist straight during the exercise. 2. Gently bend your fingers at the big knuckle, where your fingers meet your hand, and the middle knuckle. Keep the knuckle at the tips of your fingers straight and try to touch the bottom of your palm. 3. Hold this position for __________ seconds. 4. Extend your fingers back to the starting position, stretching every joint fully. Repeat this exercise 5-10 times with each hand. Tabletop 1. Stand or sit with your arm, hand, and all five fingers pointed straight up. Make sure to keep your wrist straight during the exercise. 2. Gently bend your fingers at the big knuckle, where your fingers meet your hand, as far down as you can while keeping the small knuckles in your fingers straight. Think of forming a tabletop with your fingers. 3. Hold this position for __________ seconds. 4. Extend your fingers back to the starting position, stretching every joint fully. Repeat this exercise 5-10 times with each hand. Finger spread 1. Place your hand flat on a table with your palm facing down. Make sure your wrist stays straight as you do this exercise. 2. Spread your fingers and thumb apart from each other as far as you can until you feel a gentle stretch. Hold this position for __________ seconds. 3. Bring your fingers and thumb  tight together again. Hold this position for __________ seconds. Repeat this exercise 5-10 times with each hand. Making circles 1. Stand or sit with your arm, hand, and all five fingers pointed straight up. Make sure to keep your wrist straight during the exercise. 2. Make a circle by touching the tip of your thumb to the tip of your index finger. 3. Hold for __________  seconds. Then open your hand wide. 4. Repeat this motion with your thumb and each finger on your hand. Repeat this exercise 5-10 times with each hand. Thumb motion 1. Sit with your forearm resting on a table and your wrist straight. Your thumb should be facing up toward the ceiling. Keep your fingers relaxed as you move your thumb. 2. Lift your thumb up as high as you can toward the ceiling. Hold for __________ seconds. 3. Bend your thumb across your palm as far as you can, reaching the tip of your thumb for the small finger (pinkie) side of your palm. Hold for __________ seconds. Repeat this exercise 5-10 times with each hand. Grip strengthening  1. Hold a stress ball or other soft ball in the middle of your hand. 2. Slowly increase the pressure, squeezing the ball as much as you can without causing pain. Think of bringing the tips of your fingers into the middle of your palm. All of your finger joints should bend when doing this exercise. 3. Hold your squeeze for __________ seconds, then relax. Repeat this exercise 5-10 times with each hand. Contact a health care provider if:  Your hand pain or discomfort gets much worse when you do an exercise.  Your hand pain or discomfort does not improve within 2 hours after you exercise. If you have any of these problems, stop doing these exercises right away. Do not do them again unless your health care provider says that you can. Get help right away if:  You develop sudden, severe hand pain or swelling. If this happens, stop doing these exercises right away. Do not do them again unless your health care provider says that you can. This information is not intended to replace advice given to you by your health care provider. Make sure you discuss any questions you have with your health care provider. Document Revised: 09/16/2018 Document Reviewed: 05/27/2018 Elsevier Patient Education  [place].  Arthritis Arthritis is a term that is  commonly used to refer to joint pain or joint disease. There are more than 100 types of arthritis. What are the causes? The most common cause of this condition is wear and tear of a joint. Other causes include:  Gout.  Inflammation of a joint.  An infection of a joint.  Sprains and other injuries near the joint.  A reaction to medicines or drugs, or an allergic reaction. In some cases, the cause may not be known. What are the signs or symptoms? The main symptom of this condition is pain in the joint during movement. Other symptoms include:  Redness, swelling, or stiffness at a joint.  Warmth coming from the joint.  Fever.  Overall feeling of illness. How is this diagnosed? This condition may be diagnosed with a physical exam and tests, including:  Blood tests.  Urine tests.  Imaging tests, such as X-rays, an MRI, or a CT scan. Sometimes, fluid is removed from a joint for testing. How is this treated? This condition may be treated with:  Treatment of the cause, if it is known.  Rest.  Raising (elevating) the  joint.  Applying cold or hot packs to the joint.  Medicines to improve symptoms and reduce inflammation.  Injections of a steroid such as cortisone into the joint to help reduce pain and inflammation. Depending on the cause of your arthritis, you may need to make lifestyle changes to reduce stress on your joint. Changes may include:  Exercising more.  Losing weight. Follow these instructions at home: Medicines  Take over-the-counter and prescription medicines only as told by your health care provider.  Do not take aspirin to relieve pain if your health care provider thinks that gout may be causing your pain. Activity  Rest your joint if told by your health care provider. Rest is important when your disease is active and your joint feels painful, swollen, or stiff.  Avoid activities that make the pain worse. It is important to balance activity with  rest.  Exercise your joint regularly with range-of-motion exercises as told by your health care provider. Try doing low-impact exercise, such as: ? Swimming. ? Water aerobics. ? Biking. ? Walking. Managing pain, stiffness, and swelling      If directed, put ice on the joint. ? Put ice in a plastic bag. ? Place a towel between your skin and the bag. ? Leave the ice on for 20 minutes, 2-3 times per day.  If your joint is swollen, raise (elevate) it above the level of your heart if directed by your health care provider.  If your joint feels stiff in the morning, try taking a warm shower.  If directed, apply heat to the affected area as often as told by your health care provider. Use the heat source that your health care provider recommends, such as a moist heat pack or a heating pad. If you have diabetes, do not apply heat without permission from your health care provider. To apply heat: ? Place a towel between your skin and the heat source. ? Leave the heat on for 20-30 minutes. ? Remove the heat if your skin turns bright red. This is especially important if you are unable to feel pain, heat, or cold. You may have a greater risk of getting burned. General instructions  Do not use any products that contain nicotine or tobacco, such as cigarettes, e-cigarettes, and chewing tobacco. If you need help quitting, ask your health care provider.  Keep all follow-up visits as told by your health care provider. This is important. Contact a health care provider if:  The pain gets worse.  You have a fever. Get help right away if:  You develop severe joint pain, swelling, or redness.  Many joints become painful and swollen.  You develop severe back pain.  You develop severe weakness in your leg.  You cannot control your bladder or bowels. Summary  Arthritis is a term that is commonly used to refer to joint pain or joint disease. There are more than 100 types of arthritis.  The most  common cause of this condition is wear and tear of a joint. Other causes include gout, inflammation or infection of the joint, sprains, or allergies.  Symptoms of this condition include redness, swelling, or stiffness of the joint. Other symptoms include warmth, fever, or feeling ill.  This condition is treated with rest, elevation, medicines, and applying cold or hot packs.  Follow your health care provider's instructions about medicines, activity, exercises, and other home care treatments. This information is not intended to replace advice given to you by your health care provider. Make sure you  discuss any questions you have with your health care provider. Document Revised: 05/03/2018 Document Reviewed: 05/03/2018 Elsevier Patient Education  2020 [employer].

## 2019-09-21 NOTE — Telephone Encounter (Signed)
Faxed medication refill order Potassium CL to [facility] [phone] on 4.14.2021.

## 2019-09-21 NOTE — Progress Notes (Signed)
Chief Complaint  Patient presents with  . Follow-up   F/u  1. 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 [place] right upper back wants removal dermatology  3. HTN on hct 12.5 no meds today BP slightly elevated  Review of Systems  Constitutional: Negative for weight loss.  HENT: Negative for hearing loss.   Eyes: Negative for blurred vision.  Respiratory: Negative for shortness of breath.   Cardiovascular: Negative for chest pain.  Gastrointestinal: Negative for abdominal pain.  Musculoskeletal: Negative for falls.  Skin: Negative for rash.  Neurological: Negative for headaches.  Psychiatric/Behavioral: Negative for depression.   Past Medical History:  Diagnosis Date  . Anemia due to antineoplastic chemotherapy 11/25/2018  . Cancer ([facility])   . GERD (gastroesophageal reflux disease)   . Hypertension   . Hypomagnesemia 11/25/2018  . Hypothyroidism   . Malignant neoplasm of head of pancreas ([place]) 06/08/2018  . Personal history of chemotherapy 2019    Pancreatic cancer   Past Surgical History:  Procedure Laterality Date  . CHOLECYSTECTOMY    . COLONOSCOPY N/A 12/02/2016   Procedure: COLONOSCOPY;  Surgeon: Lollie Sails, MD;  Location: [facility] ENDOSCOPY;  Service: Endoscopy;  Laterality: N/A;  . ERCP N/A 04/30/2018   Procedure: ENDOSCOPIC RETROGRADE CHOLANGIOPANCREATOGRAPHY (ERCP);  Surgeon: Lucilla Lame, MD;  Location: [facility] ENDOSCOPY;  Service: Endoscopy;  Laterality: N/A;  . ERCP N/A 05/05/2018   Procedure: ENDOSCOPIC RETROGRADE CHOLANGIOPANCREATOGRAPHY (ERCP);  Surgeon: Lucilla Lame, MD;  Location: [facility] ENDOSCOPY;  Service: Endoscopy;  Laterality: N/A;  . PORTA CATH INSERTION N/A 06/28/2018   Procedure: PORTA CATH INSERTION;  Surgeon: Algernon Huxley, MD;  Location: [facility];  Service: Cardiovascular;  Laterality: N/A;   Family History  Adopted: Yes  Problem Relation Age of Onset  . Diabetes Mellitus I Other   . Alcoholism Other   .  Hypertension Other   . Hyperlipidemia Other   . Coronary artery disease Other   . Stroke Other   . Osteoarthritis Other   . Migraines Other   . Heart Problems Mother   . Stroke Sister   . CAD Brother   . Stroke Brother   . Breast cancer Neg Hx    Social History   Socioeconomic History  . Marital status: Married    Spouse name: Not on file  . Number of children: Not on file  . Years of education: Not on file  . Highest education level: Not on file  Occupational History  . Not on file  Tobacco Use  . Smoking status: Former Smoker    Quit date: 04/14/1988    Years since quitting: 31.4  . Smokeless tobacco: Never Used  Substance and Sexual Activity  . Alcohol use: Yes    Comment: social drinker  . Drug use: No  . Sexual activity: Not Currently  Other Topics Concern  . Not on file  Social History Narrative   Lives at home married    In order of [place] star   Social Determinants of Health   Financial Resource Strain: Low Risk   . Difficulty of Paying Living Expenses: Not hard at all  Food Insecurity: No Food Insecurity  . Worried About Charity fundraiser in the Last Year: Never true  . Ran Out of Food in the Last Year: Never true  Transportation Needs: No Transportation Needs  . Lack of Transportation (Medical): No  . Lack of Transportation (Non-Medical): No  Physical Activity: Sufficiently Active  .  Days of Exercise per Week: 5 days  . Minutes of Exercise per Session: 30 min  Stress: No Stress Concern Present  . Feeling of Stress : Not at all  Social Connections:   . Frequency of Communication with Friends and Family:   . Frequency of Social Gatherings with Friends and Family:   . Attends Religious Services:   . Active Member of Clubs or Organizations:   . Attends Archivist Meetings:   Marland Kitchen Marital Status:   Intimate Partner Violence: Not At Risk  . Fear of Current or Ex-Partner: No  . Emotionally Abused: No  . Physically Abused: No  . Sexually  Abused: No   Current Meds  Medication Sig  . acetaminophen-codeine (TYLENOL #3) 300-30 MG tablet Take 1 tablet by mouth every 4 (four) hours as needed for moderate pain.  Marland Kitchen apixaban (ELIQUIS) 2.5 MG TABS tablet Take 1 tablet (2.5 mg total) by mouth 2 (two) times daily.  Marland Kitchen ascorbic acid (VITAMIN C) 1000 MG tablet Take by mouth.  . Cholecalciferol (VITAMIN D-3) 25 MCG (1000 UT) CAPS Take by mouth.  . diclofenac Sodium (VOLTAREN) 1 % GEL Apply topically 4 (four) times daily.  Marland Kitchen gabapentin (NEURONTIN) 400 MG capsule Take 1 capsule (400 mg total) by mouth 3 (three) times daily.  . hydrochlorothiazide (HYDRODIURIL) 12.5 MG tablet Take 1 tablet (12.5 mg total) by mouth daily. In am  . levothyroxine (SYNTHROID) 112 MCG tablet Take 1 tablet (112 mcg total) by mouth daily before breakfast.  . lipase/protease/amylase (CREON) 36000 UNITS CPEP capsule TAKE 1 CAPSULE BY MOUTH THREE TIMES DAILY WITH MEALS  . loperamide (IMODIUM) 2 MG capsule Take 1 capsule (2 mg total) by mouth See admin instructions. With onset of loose stool, take 4mg  followed by 2mg  every 2 hours until 12 hours have passed without loose bowel movement. Maximum: 16 mg/day  . Multiple Vitamins-Minerals (CENTRUM SILVER 50+WOMEN PO) Take by mouth.  . potassium chloride (KLOR-CON) 10 MEQ tablet Take 1 tablet (10 mEq total) by mouth daily.  . Simethicone 125 MG CAPS Take by mouth.   No Known Allergies Recent Results (from the past 2160 hour(s))  Basic Metabolic Panel (BMET)     Status: None   Collection Time: 08/02/19  8:12 AM  Result Value Ref Range   Sodium 135 135 - 145 mEq/L   Potassium 3.7 3.5 - 5.1 mEq/L   Chloride 103 96 - 112 mEq/L   CO2 24 19 - 32 mEq/L   Glucose, Bld 94 70 - 99 mg/dL   BUN 19 6 - 23 mg/dL   Creatinine, Ser 0.78 0.40 - 1.20 mg/dL   GFR 87.49 >60.00 mL/min   Calcium 10.4 8.4 - 10.5 mg/dL  Iron, TIBC and Ferritin Panel     Status: None   Collection Time: 08/02/19  8:12 AM  Result Value Ref Range   Iron 96 45  - 160 mcg/dL   TIBC 332 250 - 450 mcg/dL (calc)   %SAT 29 16 - 45 % (calc)   Ferritin 122 16 - 288 ng/mL  C-reactive protein     Status: None   Collection Time: 08/02/19  8:12 AM  Result Value Ref Range   CRP <1.0 0.5 - 20.0 mg/dL  Sedimentation rate     Status: Abnormal   Collection Time: 08/02/19  8:12 AM  Result Value Ref Range   Sed Rate 75 (H) 0 - 30 mm/hr  Cyclic citrul peptide antibody, IgG ([facility])     Status: None  Collection Time: 08/02/19  8:12 AM  Result Value Ref Range   Cyclic Citrullin Peptide Ab <16 UNITS    Comment: Reference Range Negative:            <20 Weak Positive:       20-39 Moderate Positive:   40-59 Strong Positive:     >59 .   Rheumatoid Factor     Status: None   Collection Time: 08/02/19  8:12 AM  Result Value Ref Range   Rhuematoid fact SerPl-aCnc <14 <14 IU/mL  Urinalysis, Routine w reflex microscopic     Status: Abnormal   Collection Time: 08/02/19  8:12 AM  Result Value Ref Range   Color, Urine YELLOW YELLOW   APPearance CLEAR CLEAR   Specific Gravity, Urine 1.017 1.001 - 1.03   pH < OR = 5.0 5.0 - 8.0   Glucose, UA NEGATIVE NEGATIVE   Bilirubin Urine NEGATIVE NEGATIVE   Ketones, ur NEGATIVE NEGATIVE   Hgb urine dipstick NEGATIVE NEGATIVE   Protein, ur NEGATIVE NEGATIVE   Nitrite NEGATIVE NEGATIVE   Leukocytes,Ua 1+ (A) NEGATIVE   WBC, UA 0-5 0 - 5 /HPF   RBC / HPF 0-2 0 - 2 /HPF   Squamous Epithelial / LPF 0-5 < OR = 5 /HPF   Bacteria, UA NONE SEEN NONE SEEN /HPF   Calcium Oxalate Crystal MANY (A) NONE OR FE /HPF   Hyaline Cast NONE SEEN NONE SEEN /LPF   Objective  Body mass index is 27.63 kg/m. Wt Readings from Last 3 Encounters:  09/21/19 156 lb (70.8 kg)  06/14/19 153 lb 12.8 oz (69.8 kg)  05/17/19 149 lb 11.2 oz (67.9 kg)   Temp Readings from Last 3 Encounters:  09/21/19 (!) 97.2 F (36.2 C) (Temporal)  06/14/19 (!) 97.2 F (36.2 C) (Temporal)  05/17/19 97.8 F (36.6 C)   BP Readings from Last 3 Encounters:   09/21/19 134/88  06/14/19 (!) 148/98  05/17/19 (!) 160/89   Pulse Readings from Last 3 Encounters:  09/21/19 70  06/14/19 73  05/17/19 90    Physical Exam Vitals and nursing note reviewed.  Constitutional:      Appearance: Normal appearance. She is well-developed and well-groomed.  HENT:     Head: Normocephalic and atraumatic.  Eyes:     Conjunctiva/sclera: Conjunctivae normal.     Pupils: Pupils are equal, round, and reactive to light.  Cardiovascular:     Rate and Rhythm: Normal rate. Rhythm irregular.     Heart sounds: Normal heart sounds. No murmur.  Pulmonary:     Effort: Pulmonary effort is normal.     Breath sounds: Normal breath sounds.  Abdominal:     General: Abdomen is flat. Bowel sounds are normal.     Tenderness: There is no abdominal tenderness.  Skin:    General: Skin is warm and dry.  Neurological:     General: No focal deficit present.     Mental Status: She is alert and oriented to person, place, and time. Mental status is at baseline.     Gait: Gait normal.  Psychiatric:        Attention and Perception: Attention and perception normal.        Mood and Affect: Mood and affect normal.        Speech: Speech normal.        Behavior: Behavior normal. Behavior is cooperative.        Thought Content: Thought content normal.  Cognition and Memory: Cognition and memory normal.        Judgment: Judgment normal.     Assessment  Plan  [place] - Plan: Ambulatory referral to Dermatology  Osteoarthritis, unspecified osteoarthritis type, unspecified site Given hand exercises  Essential hypertension  Cont meds   HM covid 19 vx 2/2  Flu shot declines Prevnar, pna 23 declines  Tdap, shingrix declines   mammo  07/26/19 negative  Colonoscopy 12/02/16 sessile polyp and tubular adenoma [facility]  No pap due dexa 07/27/15 normal rec centrum silver or nature made mvt with iron   Provider: Dr. Olivia Mackie McLean-Scocuzza-Internal Medicine

## 2019-09-22 ENCOUNTER — Telehealth: Payer: Self-pay | Admitting: Internal Medicine

## 2019-09-22 NOTE — Telephone Encounter (Signed)
I called Raquel Sarna at [facility] regarding referral.

## 2019-09-25 ENCOUNTER — Other Ambulatory Visit: Payer: Self-pay | Admitting: Oncology

## 2019-11-02 ENCOUNTER — Inpatient Hospital Stay (HOSPITAL_BASED_OUTPATIENT_CLINIC_OR_DEPARTMENT_OTHER): Payer: Medicare Other | Admitting: Oncology

## 2019-11-02 ENCOUNTER — Inpatient Hospital Stay: Payer: Medicare Other | Attending: Oncology

## 2019-11-02 ENCOUNTER — Encounter: Payer: Self-pay | Admitting: Oncology

## 2019-11-02 ENCOUNTER — Other Ambulatory Visit: Payer: Self-pay

## 2019-11-02 VITALS — BP 128/73 | HR 63 | Temp 98.3°F | Resp 18 | Wt 161.9 lb

## 2019-11-02 DIAGNOSIS — I10 Essential (primary) hypertension: Secondary | ICD-10-CM | POA: Diagnosis not present

## 2019-11-02 DIAGNOSIS — E039 Hypothyroidism, unspecified: Secondary | ICD-10-CM | POA: Insufficient documentation

## 2019-11-02 DIAGNOSIS — Z7901 Long term (current) use of anticoagulants: Secondary | ICD-10-CM | POA: Diagnosis not present

## 2019-11-02 DIAGNOSIS — Z87891 Personal history of nicotine dependence: Secondary | ICD-10-CM | POA: Insufficient documentation

## 2019-11-02 DIAGNOSIS — Z86718 Personal history of other venous thrombosis and embolism: Secondary | ICD-10-CM | POA: Diagnosis not present

## 2019-11-02 DIAGNOSIS — Z79899 Other long term (current) drug therapy: Secondary | ICD-10-CM | POA: Diagnosis not present

## 2019-11-02 DIAGNOSIS — C250 Malignant neoplasm of head of pancreas: Secondary | ICD-10-CM

## 2019-11-02 DIAGNOSIS — R7989 Other specified abnormal findings of blood chemistry: Secondary | ICD-10-CM | POA: Insufficient documentation

## 2019-11-02 DIAGNOSIS — G620 Drug-induced polyneuropathy: Secondary | ICD-10-CM | POA: Insufficient documentation

## 2019-11-02 DIAGNOSIS — Z791 Long term (current) use of non-steroidal anti-inflammatories (NSAID): Secondary | ICD-10-CM | POA: Diagnosis not present

## 2019-11-02 DIAGNOSIS — K219 Gastro-esophageal reflux disease without esophagitis: Secondary | ICD-10-CM | POA: Insufficient documentation

## 2019-11-02 DIAGNOSIS — Z9221 Personal history of antineoplastic chemotherapy: Secondary | ICD-10-CM | POA: Diagnosis not present

## 2019-11-02 DIAGNOSIS — D649 Anemia, unspecified: Secondary | ICD-10-CM | POA: Insufficient documentation

## 2019-11-02 DIAGNOSIS — R600 Localized edema: Secondary | ICD-10-CM | POA: Diagnosis not present

## 2019-11-02 DIAGNOSIS — Z7982 Long term (current) use of aspirin: Secondary | ICD-10-CM | POA: Diagnosis not present

## 2019-11-02 DIAGNOSIS — T451X5A Adverse effect of antineoplastic and immunosuppressive drugs, initial encounter: Secondary | ICD-10-CM | POA: Insufficient documentation

## 2019-11-02 DIAGNOSIS — G629 Polyneuropathy, unspecified: Secondary | ICD-10-CM

## 2019-11-02 DIAGNOSIS — Z95828 Presence of other vascular implants and grafts: Secondary | ICD-10-CM | POA: Diagnosis not present

## 2019-11-02 LAB — COMPREHENSIVE METABOLIC PANEL
ALT: 20 U/L (ref 0–44)
AST: 21 U/L (ref 15–41)
Albumin: 3.9 g/dL (ref 3.5–5.0)
Alkaline Phosphatase: 81 U/L (ref 38–126)
Anion gap: 5 (ref 5–15)
BUN: 21 mg/dL (ref 8–23)
CO2: 28 mmol/L (ref 22–32)
Calcium: 9.3 mg/dL (ref 8.9–10.3)
Chloride: 103 mmol/L (ref 98–111)
Creatinine, Ser: 0.83 mg/dL (ref 0.44–1.00)
GFR calc Af Amer: >60 mL/min (ref >60)
GFR calc non Af Amer: >60 mL/min (ref >60)
Glucose, Bld: 102 mg/dL — ABNORMAL HIGH (ref 70–99)
Potassium: 3.6 mmol/L (ref 3.5–5.1)
Sodium: 136 mmol/L (ref 135–145)
Total Bilirubin: 0.5 mg/dL (ref 0.3–1.2)
Total Protein: 7.7 g/dL (ref 6.5–8.1)

## 2019-11-02 LAB — CBC WITH DIFFERENTIAL/PLATELET
Abs Immature Granulocytes: 0.02 10*3/uL (ref 0.00–0.07)
Basophils Absolute: 0 10*3/uL (ref 0.0–0.1)
Basophils Relative: 0 %
Eosinophils Absolute: 0.1 10*3/uL (ref 0.0–0.5)
Eosinophils Relative: 1 %
HCT: 32.7 % — ABNORMAL LOW (ref 36.0–46.0)
Hemoglobin: 10.7 g/dL — ABNORMAL LOW (ref 12.0–15.0)
Immature Granulocytes: 0 %
Lymphocytes Relative: 32 %
Lymphs Abs: 2.2 10*3/uL (ref 0.7–4.0)
MCH: 28 pg (ref 26.0–34.0)
MCHC: 32.7 g/dL (ref 30.0–36.0)
MCV: 85.6 fL (ref 80.0–100.0)
Monocytes Absolute: 0.5 10*3/uL (ref 0.1–1.0)
Monocytes Relative: 7 %
Neutro Abs: 4.1 10*3/uL (ref 1.7–7.7)
Neutrophils Relative %: 60 %
Platelets: 177 10*3/uL (ref 150–400)
RBC: 3.82 MIL/uL — ABNORMAL LOW (ref 3.87–5.11)
RDW: 14.6 % (ref 11.5–15.5)
WBC: 7 10*3/uL (ref 4.0–10.5)
nRBC: 0 % (ref 0.0–0.2)

## 2019-11-02 MED ORDER — HEPARIN SOD (PORK) LOCK FLUSH 100 UNIT/ML IV SOLN
INTRAVENOUS | Status: AC
  Filled 2019-11-02: qty 5

## 2019-11-02 MED ORDER — HEPARIN SOD (PORK) LOCK FLUSH 100 UNIT/ML IV SOLN
500.0000 [IU] | Freq: Once | INTRAVENOUS | Status: AC
  Administered 2019-11-02: 500 [IU] via INTRAVENOUS
  Filled 2019-11-02: qty 5

## 2019-11-02 MED ORDER — ASPIRIN 81 MG PO TBEC: 162.0000 mg | DELAYED_RELEASE_TABLET | Freq: Every day | ORAL | 1 refills | Status: DC

## 2019-11-02 MED ORDER — SODIUM CHLORIDE 0.9% FLUSH
10.0000 mL | Freq: Once | INTRAVENOUS | Status: AC
  Administered 2019-11-02: 10 mL via INTRAVENOUS
  Filled 2019-11-02: qty 10

## 2019-11-02 NOTE — Progress Notes (Signed)
Eliquis discontinued per provider request. Prescription for Aspirin sent to [facility] per pt request.

## 2019-11-02 NOTE — Progress Notes (Signed)
Hematology/Oncology Follow Up Note [facility]  Telephone:([phone] [phone]  Patient Care Team: McLean-Scocuzza, Nino Glow, MD as PCP - General (Internal Medicine) Clent Jacks, RN as Registered Nurse Jonathon Bellows, MD as Consulting Physician (Gastroenterology) Clent Jacks, RN as Oncology Nurse Navigator Earlie Server, MD as Consulting Physician (Oncology) Fayrene Helper Stacie Acres, MD as Referring Physician (Oncology) Algernon Huxley, MD as Referring Physician (Vascular Surgery)   Name of the patient: Lynn Taylor  OR:[id number]  April 05, 1946   REASON FOR VISIT  follow-up for adjuvant chemotherapy for pancreatic cancer.  HISTORY OF PRESENTING ILLNESS:  Lynn Taylor is a  74 y.o.  female with PMH listed below who was referred to me for evaluation of evaluation of elevated ferritin. Patient recently had lab work-up done on 04/09/2018 which showed ferritin level 1877, iron 97, TIBC 340, iron saturation 29, 04/11/2018 CBC showed hemoglobin 13.2, MCV 81, WBC 7.6, platelet counts 246,000. Patient was referred to hematology for further evaluation of elevated ferritin.  #04/28/2018 Ultrasound of liver was obtained which showed CBD and intrahepatic biliary dilatation Patient wa she is s advised to go to emergency room for further evaluation. Also had hyperbilirubinemia.  Patient was complaining about being jaundiced, pruritus all over. MR abdomen MRCP with and without contrast showed market biliary duct dilatation, secondary to obstruction in the region of pancreatic head.  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 No abdominal adenopathy, liver metastasis or cross vascular involvement.  patient was evaluated by gastroenterology and status post ERCP with stenting. CA 19.9 1173 CEA 3.9 # 05/25/2018  patient was referred to [facility] and s/p whipple resection.Her postop course was c/b Type A pancreatic leak and  c.diff. Pathology showed A. Hepatic artery lymph node, excision: One lymph node, negative for malignancy (0/1). B. Biliary stent, removal: Medical device, consistent with stent, gross examination only. C. 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 All margins are negative (closest margin=uncinate, 0.5 mm) Metastatic adenocarcinoma in one of thirty-two lymph nodes (1/32).  Portion of stomach and duodenum with no specific pathologic diagnosis.   Grade, 3 poorly differentiated.  Pathologic pT2 pN1  Cancer Treatment 07/13/2018 Cycle 1 FOLFIRINOX with Onpro Neulasta.  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 10/06/2018 cycle 6  mFOLFIRINOX with Onpro Neulasta - [50% dose for Irinotecan- 5-FU bolus omitted]. 10/20/2018 cycle 7 mFOLFIRINOX with Onpro Neulasta - [50% dose for Irinotecan- 5-FU bolus omitted].  INTERVAL HISTORY Lynn Taylor is a 74 y.o. female with history of stage IIb pancreatic cancer presents to for follow-up.   She has gained weight since last visit.  She follows up with [facility] Dr.Lidsky every 3 months and have her surveillance work up done at [facility].  Denies any fever, chills, nausea, vomiting, abdominal pain. She has no new complaints.  Recently had CT done at [facility].  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 She takes  gabapentine   Review of Systems  Constitutional: Negative for  appetite change, chills, fatigue, fever and unexpected weight change.  HENT:   Negative for hearing loss and voice change.   Eyes: Negative for eye problems.  Respiratory: Negative for chest tightness, cough and shortness of breath.   Cardiovascular: Negative for chest pain and leg swelling.  Gastrointestinal: Negative for abdominal distention, abdominal pain, blood in stool and diarrhea.  Endocrine: Negative for hot flashes.  Genitourinary: Negative for difficulty urinating and frequency.   Musculoskeletal: Negative for arthralgias.  Skin: Negative for itching and rash.  Neurological: Positive for numbness. Negative for extremity weakness.  Hematological: Negative for adenopathy.  Psychiatric/Behavioral: Negative for confusion.      No Known Allergies   Past Medical History:  Diagnosis Date  . Anemia due to antineoplastic chemotherapy 11/25/2018  . Cancer ([place])   . GERD (gastroesophageal reflux disease)   . Hypertension   . Hypomagnesemia 11/25/2018  . Hypothyroidism   . Malignant neoplasm of head of pancreas ([place]) 06/08/2018  . Personal history of chemotherapy 2019    Pancreatic cancer     Past Surgical History:  Procedure Laterality Date  . CHOLECYSTECTOMY    . COLONOSCOPY N/A 12/02/2016   Procedure: COLONOSCOPY;  Surgeon: Lollie Sails, MD;  Location: [facility] ENDOSCOPY;  Service: Endoscopy;  Laterality: N/A;  . ERCP N/A 04/30/2018   Procedure: ENDOSCOPIC RETROGRADE CHOLANGIOPANCREATOGRAPHY (ERCP);  Surgeon: Lucilla Lame, MD;  Location: [facility] ENDOSCOPY;  Service: Endoscopy;  Laterality: N/A;  . ERCP N/A 05/05/2018   Procedure: ENDOSCOPIC RETROGRADE CHOLANGIOPANCREATOGRAPHY (ERCP);  Surgeon: Lucilla Lame, MD;  Location: [facility] ENDOSCOPY;  Service: Endoscopy;  Laterality: N/A;  . PORTA CATH INSERTION N/A 06/28/2018   Procedure: PORTA CATH INSERTION;  Surgeon: Algernon Huxley, MD;  Location: [place] CV LAB;   Service: Cardiovascular;  Laterality: N/A;    Social History   Socioeconomic History  . Marital status: Married    Spouse name: Not on file  . Number of children: Not on file  . Years of education: Not on file  . Highest education level: Not on file  Occupational History  . Not on file  Tobacco Use  . Smoking status: Former Smoker    Quit date: 04/14/1988    Years since quitting: 31.5  . Smokeless tobacco: Never Used  Substance and Sexual Activity  . Alcohol use: Yes    Comment: social drinker  . Drug use: No  . Sexual activity: Not Currently  Other Topics Concern  . Not on file  Social History Narrative   Lives at home married    In order of [place] star   Social Determinants of Health   Financial Resource Strain: Low Risk   . Difficulty of Paying Living Expenses: Not hard at all  Food Insecurity: No Food Insecurity  . Worried About Charity fundraiser in the Last Year: Never true  . Ran Out of Food in the Last Year: Never true  Transportation Needs: No Transportation Needs  . Lack of Transportation (Medical): No  . Lack of Transportation (Non-Medical): No  Physical Activity: Sufficiently Active  . Days of Exercise per Week: 5 days  . Minutes of Exercise per Session: 30 min  Stress: No Stress Concern Present  . Feeling of Stress : Not at all  Social Connections:   . Frequency of Communication with Friends and Family:   . Frequency of Social Gatherings with Friends and Family:   . Attends Religious Services:   . Active Member of Clubs or Organizations:   . Attends Club or  Organization Meetings:   Marland Kitchen Marital Status:   Intimate Partner Violence: Not At Risk  . Fear of Current or Ex-Partner: No  . Emotionally Abused: No  . Physically Abused: No  . Sexually Abused: No    Family History  Adopted: Yes  Problem Relation Age of Onset  . Diabetes Mellitus I Other   . Alcoholism Other   . Hypertension Other   . Hyperlipidemia Other   . Coronary artery disease  Other   . Stroke Other   . Osteoarthritis Other   . Migraines Other   . Heart Problems Mother   . Stroke Sister   . CAD Brother   . Stroke Brother   . Breast cancer Neg Hx      Current Outpatient Medications:  .  acetaminophen-codeine (TYLENOL #3) 300-30 MG tablet, Take 1 tablet by mouth every 4 (four) hours as needed for moderate pain., Disp: 20 tablet, Rfl: 0 .  ascorbic acid (VITAMIN C) 1000 MG tablet, Take by mouth., Disp: , Rfl:  .  Cholecalciferol (VITAMIN D-3) 25 MCG (1000 UT) CAPS, Take by mouth., Disp: , Rfl:  .  CREON 36000 units CPEP capsule, TAKE 1 CAPSULE BY MOUTH THREE TIMES DAILY WITH MEALS, Disp: 90 capsule, Rfl: 0 .  diclofenac Sodium (VOLTAREN) 1 % GEL, Apply topically 4 (four) times daily., Disp: , Rfl:  .  gabapentin (NEURONTIN) 400 MG capsule, Take 1 capsule (400 mg total) by mouth 3 (three) times daily., Disp: 270 capsule, Rfl: 3 .  hydrochlorothiazide (HYDRODIURIL) 12.5 MG tablet, Take 1 tablet (12.5 mg total) by mouth daily. In am, Disp: 90 tablet, Rfl: 3 .  levothyroxine (SYNTHROID) 112 MCG tablet, Take 1 tablet (112 mcg total) by mouth daily before breakfast., Disp: 90 tablet, Rfl: 3 .  loperamide (IMODIUM) 2 MG capsule, Take 1 capsule (2 mg total) by mouth See admin instructions. With onset of loose stool, take 4mg  followed by 2mg  every 2 hours until 12 hours have passed without loose bowel movement. Maximum: 16 mg/day, Disp: 120 capsule, Rfl: 1 .  Magnesium Cl-Calcium Carbonate (SLOW-MAG PO), Take by mouth. 2 QD, Disp: , Rfl:  .  Multiple Vitamins-Minerals (CENTRUM SILVER 50+WOMEN PO), Take by mouth., Disp: , Rfl:  .  potassium chloride (KLOR-CON) 10 MEQ tablet, Take 1 tablet (10 mEq total) by mouth daily., Disp: 90 tablet, Rfl: 3 .  Simethicone 125 MG CAPS, Take by mouth., Disp: , Rfl:  .  aspirin (ASPIRIN 81) 81 MG EC tablet, Take 2 tablets (162 mg total) by mouth daily. Swallow whole., Disp: 180 tablet, Rfl: 1 .  ondansetron (ZOFRAN) 4 MG tablet, TAKE 1  TABLET BY MOUTH  EVERY 6 HOURS AS NEEDED FOR NAUSEA OR VOMITING. (Patient not taking: Reported on 09/21/2019), Disp: 180 tablet, Rfl: 1 .  prochlorperazine (COMPAZINE) 10 MG tablet, TAKE 1 TABLET BY MOUTH  EVERY 6 HOURS AS NEEDED (Patient not taking: Reported on 11/02/2019), Disp: 90 tablet, Rfl: 0  Physical exam: ECOG 2 Vitals:   11/02/19 1332  BP: 128/73  Pulse: 63  Resp: 18  Temp: 98.3 F (36.8 C)  Weight: 161 lb 14.4 oz (73.4 kg)   Physical Exam Constitutional:      General: She is not in acute distress. HENT:     Head: Normocephalic and atraumatic.  Eyes:     General: No scleral icterus.    Pupils: Pupils are equal, round, and reactive to light.  Cardiovascular:     Rate and Rhythm: Normal rate and regular rhythm.  Heart sounds: Normal heart sounds.  Pulmonary:     Effort: Pulmonary effort is normal. No respiratory distress.     Breath sounds: No wheezing.  Abdominal:     General: Bowel sounds are normal. There is no distension.     Palpations: Abdomen is soft. There is no mass.     Tenderness: There is no abdominal tenderness.  Musculoskeletal:        General: No swelling or deformity. Normal range of motion.     Cervical back: Normal range of motion and neck supple.  Skin:    General: Skin is warm and dry.     Findings: No erythema or rash.  Neurological:     Mental Status: She is alert and oriented to person, place, and time. Mental status is at baseline.     Cranial Nerves: No cranial nerve deficit.     Coordination: Coordination normal.  Psychiatric:        Mood and Affect: Mood normal.     CMP Latest Ref Rng & Units 11/02/2019  Glucose 70 - 99 mg/dL 102(H)  BUN 8 - 23 mg/dL 21  Creatinine 0.44 - 1.00 mg/dL 0.83  Sodium 135 - 145 mmol/L 136  Potassium 3.5 - 5.1 mmol/L 3.6  Chloride 98 - 111 mmol/L 103  CO2 22 - 32 mmol/L 28  Calcium 8.9 - 10.3 mg/dL 9.3  Total Protein 6.5 - 8.1 g/dL 7.7  Total Bilirubin 0.3 - 1.2 mg/dL 0.5  Alkaline Phos 38 - 126 U/L  81  AST 15 - 41 U/L 21  ALT 0 - 44 U/L 20   CBC Latest Ref Rng & Units 11/02/2019  WBC 4.0 - 10.5 K/uL 7.0  Hemoglobin 12.0 - 15.0 g/dL 10.7(L)  Hematocrit 36.0 - 46.0 % 32.7(L)  Platelets 150 - 400 K/uL 177   RADIOGRAPHIC STUDIES: I have personally reviewed the radiological images as listed and agreed with the findings in the report.  No results found.   Assessment and plan Patient is a 74 y.o. female history of stage IIb pancreatic cancer present for discussion of image results and management of newly diagnosed bilateral lower extremity DVT. 1. Malignant neoplasm of head of pancreas ([place])   2. History of deep vein thrombosis (DVT) of lower extremity   3. Port-A-Cath in place   4. Anemia, unspecified type   5. Neuropathy    #Stage IIB pancreatic cancer status post surgical resection. Previously received 7 out of the 12 recommended cycles of mFOLFIRINOX with  Difficulties and have decided not to proceed with additional adjuvant chemotherapy. She has been doing well.  Recent CT scan was done at [facility]. I reviewed the results. Images were not available to me.  CA19.9 level is pending at the time of dictation.  She follows up closed with [facility] and have lab and images at interval of every 3 months.    # Chronic anemia, hemoglobin is stable at 10.7.   #History of bilateral lower extremity DVT, no definitve provoking events. Possible due to immobility during her chemotherapy treatments.  Repeat US lower extremity showed near completion of DVT with minimal mild chronic DVT.  Patient continued Eliquis 2.5mg  BID for another 6 months.  She has minimal lower extremity edema. She has returned to her baseline activity.  She prefers to discontinue eliquis which is reasonable.  recommend patient to take Aspirin 162mg  daily as prophylaxis.   #Chemotherapy-induced neuropathy, continue gabapentin 400 mg 3 times daily.refer to acupuncture clinic #Discussed about keeping Mediport for  1 to 2  years. Continue  port flush every  6 to 8 weeks.  Follow-up pain in 6 months.Earlie Server, MD, PhD Hematology Oncology [place] [facility] 11/02/2019

## 2019-11-02 NOTE — Progress Notes (Signed)
Patient still has neuropathy in her feet and is uncomfortable.

## 2019-11-03 LAB — CANCER ANTIGEN 19-9: CA 19-9: 16 U/mL (ref 0–35)

## 2019-11-04 ENCOUNTER — Other Ambulatory Visit: Payer: Self-pay | Admitting: *Deleted

## 2019-11-04 MED ORDER — ASPIRIN 81 MG PO TBEC: 162.0000 mg | DELAYED_RELEASE_TABLET | Freq: Every day | ORAL | 1 refills | Status: DC

## 2019-11-08 ENCOUNTER — Other Ambulatory Visit: Payer: Self-pay | Admitting: *Deleted

## 2019-11-08 MED ORDER — PANCRELIPASE (LIP-PROT-AMYL) 36000-114000 UNITS PO CPEP: ORAL_CAPSULE | ORAL | 0 refills | Status: DC

## 2019-11-14 ENCOUNTER — Encounter: Payer: Self-pay | Admitting: Internal Medicine

## 2019-11-17 ENCOUNTER — Telehealth (INDEPENDENT_AMBULATORY_CARE_PROVIDER_SITE_OTHER): Payer: Medicare Other | Admitting: Internal Medicine

## 2019-11-17 VITALS — Ht 63.0 in | Wt 161.0 lb

## 2019-11-17 DIAGNOSIS — J321 Chronic frontal sinusitis: Secondary | ICD-10-CM

## 2019-11-17 MED ORDER — SALINE SPRAY 0.65 % NA SOLN: 2.0000 | Freq: Every day | NASAL | 2 refills | Status: DC | PRN

## 2019-11-17 MED ORDER — FLUTICASONE PROPIONATE 50 MCG/ACT NA SUSP: 2.0000 | Freq: Every day | NASAL | 2 refills | Status: DC

## 2019-11-17 MED ORDER — AZITHROMYCIN 250 MG PO TABS: ORAL_TABLET | ORAL | 0 refills | Status: DC

## 2019-11-17 MED ORDER — MUCINEX DM MAXIMUM STRENGTH 60-1200 MG PO TB12: 1.0000 | ORAL_TABLET | Freq: Two times a day (BID) | ORAL | 0 refills | Status: DC | PRN

## 2019-11-17 NOTE — Progress Notes (Signed)
Virtual Visit via Video Note  I connected with Lynn Taylor  on 11/17/19 at  1:20 PM EDT by a video enabled telemedicine application and verified that I am speaking with the correct person using two identifiers.  Location patient: home Location provider:work or home office Persons participating in the virtual visit: patient, provider  I discussed the limitations of evaluation and management by telemedicine and the availability of in person appointments. The patient expressed understanding and agreed to proceed.   HPI: 1. W/in the last week c/o frontal sinus pressure even behind her eyes and rattling in her chest. No h/o allergies. Tried cough drops and this is it. She has cough with phelgm but unable to get out temp 98.9 no sob.    ROS: See pertinent positives and negatives per HPI.  Past Medical History:  Diagnosis Date  . Anemia due to antineoplastic chemotherapy 11/25/2018  . Cancer ([place])   . GERD (gastroesophageal reflux disease)   . Hypertension   . Hypomagnesemia 11/25/2018  . Hypothyroidism   . Malignant neoplasm of head of pancreas ([place]) 06/08/2018  . Personal history of chemotherapy 2019    Pancreatic cancer    Past Surgical History:  Procedure Laterality Date  . CHOLECYSTECTOMY    . COLONOSCOPY N/A 12/02/2016   Procedure: COLONOSCOPY;  Surgeon: Lollie Sails, MD;  Location: [facility] ENDOSCOPY;  Service: Endoscopy;  Laterality: N/A;  . ERCP N/A 04/30/2018   Procedure: ENDOSCOPIC RETROGRADE CHOLANGIOPANCREATOGRAPHY (ERCP);  Surgeon: Lucilla Lame, MD;  Location[facility] ENDOSCOPY;  Service: Endoscopy;  Laterality: N/A;  . ERCP N/A 05/05/2018   Procedure: ENDOSCOPIC RETROGRADE CHOLANGIOPANCREATOGRAPHY (ERCP);  Surgeon: Lucilla Lame, MD;  Location: [facility] ENDOSCOPY;  Service: Endoscopy;  Laterality: N/A;  . PORTA CATH INSERTION N/A 06/28/2018   Procedure: PORTA CATH INSERTION;  Surgeon: Algernon Huxley, MD;  Location: [place] CV LAB;  Service: Cardiovascular;  Laterality: N/A;     Family History  Adopted: Yes  Problem Relation Age of Onset  . Diabetes Mellitus I Other   . Alcoholism Other   . Hypertension Other   . Hyperlipidemia Other   . Coronary artery disease Other   . Stroke Other   . Osteoarthritis Other   . Migraines Other   . Heart Problems Mother   . Stroke Sister   . CAD Brother   . Stroke Brother   . Breast cancer Neg Hx     SOCIAL HX: lives with husband  Current Outpatient Medications:  .  acetaminophen-codeine (TYLENOL #3) 300-30 MG tablet, Take 1 tablet by mouth every 4 (four) hours as needed for moderate pain., Disp: 20 tablet, Rfl: 0 .  ascorbic acid (VITAMIN C) 1000 MG tablet, Take by mouth., Disp: , Rfl:  .  aspirin (ASPIRIN 81) 81 MG EC tablet, Take 2 tablets (162 mg total) by mouth daily. Swallow whole., Disp: 180 tablet, Rfl: 1 .  Cholecalciferol (VITAMIN D-3) 25 MCG (1000 UT) CAPS, Take by mouth., Disp: , Rfl:  .  diclofenac Sodium (VOLTAREN) 1 % GEL, Apply topically 4 (four) times daily., Disp: , Rfl:  .  gabapentin (NEURONTIN) 400 MG capsule, Take 1 capsule (400 mg total) by mouth 3 (three) times daily., Disp: 270 capsule, Rfl: 3 .  hydrochlorothiazide (HYDRODIURIL) 12.5 MG tablet, Take 1 tablet (12.5 mg total) by mouth daily. In am, Disp: 90 tablet, Rfl: 3 .  levothyroxine (SYNTHROID) 112 MCG tablet, Take 1 tablet (112 mcg total) by mouth daily before breakfast., Disp: 90 tablet, Rfl: 3 .  lipase/protease/amylase (CREON)  36000 UNITS CPEP capsule, TAKE 1 CAPSULE BY MOUTH THREE TIMES DAILY WITH MEALS, Disp: 90 capsule, Rfl: 0 .  loperamide (IMODIUM) 2 MG capsule, Take 1 capsule (2 mg total) by mouth See admin instructions. With onset of loose stool, take 4mg  followed by 2mg  every 2 hours until 12 hours have passed without loose bowel movement. Maximum: 16 mg/day, Disp: 120 capsule, Rfl: 1 .  Magnesium Cl-Calcium Carbonate (SLOW-MAG PO), Take by mouth. 2 QD, Disp: , Rfl:  .  Multiple Vitamins-Minerals (CENTRUM SILVER 50+WOMEN PO),  Take by mouth., Disp: , Rfl:  .  ondansetron (ZOFRAN) 4 MG tablet, TAKE 1 TABLET BY MOUTH  EVERY 6 HOURS AS NEEDED FOR NAUSEA OR VOMITING., Disp: 180 tablet, Rfl: 1 .  potassium chloride (KLOR-CON) 10 MEQ tablet, Take 1 tablet (10 mEq total) by mouth daily., Disp: 90 tablet, Rfl: 3 .  prochlorperazine (COMPAZINE) 10 MG tablet, TAKE 1 TABLET BY MOUTH  EVERY 6 HOURS AS NEEDED, Disp: 90 tablet, Rfl: 0 .  Simethicone 125 MG CAPS, Take by mouth., Disp: , Rfl:  .  azithromycin (ZITHROMAX) 250 MG tablet, 2 pills day 1 and 1 pill day 2-5 with food, Disp: 6 tablet, Rfl: 0 .  Dextromethorphan-guaiFENesin (MUCINEX DM MAXIMUM STRENGTH) 60-1200 MG TB12, Take 1 tablet by mouth 2 (two) times daily as needed. GREEN LABEL, Disp: 30 tablet, Rfl: 0 .  fluticasone (FLONASE) 50 MCG/ACT nasal spray, Place 2 sprays into both nostrils daily. After nasal saline, Disp: 16 g, Rfl: 2 .  sodium chloride (OCEAN) 0.65 % SOLN nasal spray, Place 2 sprays into both nostrils daily as needed for congestion., Disp: 30 mL, Rfl: 2  EXAM:  VITALS per patient if applicable:  GENERAL: alert, oriented, appears well and in no acute distress  HEENT: atraumatic, conjunttiva clear, no obvious abnormalities on inspection of external nose and ears  NECK: normal movements of the head and neck  LUNGS: on inspection no signs of respiratory distress, breathing rate appears normal, no obvious gross SOB, gasping or wheezing  CV: no obvious cyanosis  MS: moves all visible extremities without noticeable abnormality  PSYCH/NEURO: pleasant and cooperative, no obvious depression or anxiety, speech and thought processing grossly intact  ASSESSMENT AND PLAN:  Discussed the following assessment and plan:  Frontal sinusitis, unspecified chronicity - Plan: sodium chloride (OCEAN) 0.65 % SOLN nasal spray, fluticasone (FLONASE) 50 MCG/ACT nasal spray, Dextromethorphan-guaiFENesin (MUCINEX DM MAXIMUM STRENGTH) 60-1200 MG TB12, azithromycin (ZITHROMAX)  250 MG tablet Consider albuterol inhaler if sx's do not improve   if gets yeast infection give prn Diflucan with Abx  -we discussed possible serious and likely etiologies, options for evaluation and workup, limitations of telemedicine visit vs in person visit, treatment, treatment risks and precautions. Pt prefers to treat via telemedicine empirically rather then risking or undertaking an in person visit at this moment. Patient agrees to seek prompt in person care if worsening, new symptoms arise, or if is not improving with treatment.   I discussed the assessment and treatment plan with the patient. The patient was provided an opportunity to ask questions and all were answered. The patient agreed with the plan and demonstrated an understanding of the instructions.   The patient was advised to call back or seek an in-person evaluation if the symptoms worsen or if the condition fails to improve as anticipated.  Time 20 min Delorise Jackson, MD

## 2019-12-04 ENCOUNTER — Other Ambulatory Visit: Payer: Self-pay | Admitting: Oncology

## 2019-12-27 ENCOUNTER — Ambulatory Visit: Payer: Medicare Other | Admitting: Dermatology

## 2019-12-27 ENCOUNTER — Other Ambulatory Visit: Payer: Self-pay

## 2019-12-27 DIAGNOSIS — L720 Epidermal cyst: Secondary | ICD-10-CM

## 2019-12-27 NOTE — Patient Instructions (Addendum)
Pre-Operative Instructions  You are scheduled for a surgical procedure at [facility]. We recommend you read the following instructions. If you have any questions or concerns, please call the office at [phone].  1. Shower and wash the entire body with soap and water the day of your surgery paying special attention to cleansing at and around the planned surgery site.  2. Avoid aspirin or aspirin containing products at least fourteen (14) days prior to your surgical procedure and for at least one week (7 Days) after your surgical procedure. If you take aspirin on a regular basis for heart disease or history of stroke or for any other reason, we may recommend you continue taking aspirin but please notify us if you take this on a regular basis. Aspirin can cause more bleeding to occur during surgery as well as prolonged bleeding and bruising after surgery.   3. Avoid other nonsteroidal pain medications at least one week prior to surgery and at least one week prior to your surgery. These include medications such as Ibuprofen (Motrin, Advil and Nuprin), Naprosyn, Voltaren, Relafen, etc. If medications are used for therapeutic reasons, please inform us as they can cause increased bleeding or prolonged bleeding during and bruising after surgical procedures.   4. Please advice us if you are taking any "blood thinner" medications such as Coumadin or Dipyridamole or Plavix or similar medications. These cause increased bleeding and prolonged bleeding during and bruising after surgical procedures. We may have to consider discontinuing these medications briefly prior to and shortly after your surgery, if safe to do so.   5. Please inform us of all medications you are currently taking. All medications that are taken regularly should be taken the day of surgery as you always do. Nevertheless, we need to be informed of what medications you are taking prior to surgery to whether they will affect the  procedure or cause any complications.   6. Please inform us of any medication allergies. Also inform us of whether you have allergies to Latex or rubber products or whether you have had any adverse reaction to Lidocaine or Epinephrine.  7. Please inform us of any prosthetic or artificial body parts such as artificial heart valve, joint replacements, etc., or similar condition that might require preoperative antibiotics.   8. We recommend avoidance of alcohol at least two weeks prior to surgery and continued avoidence for at least two weeks after surgery.   9. We recommend discontinuation of tobacco smoking at least two weeks prior to surgery and continued abstinence for at least two weeks after surgery.  10. Do not plan strenuous exercise, strenuous work or strenuous lifting for approximately four weeks after your surgery.   11. We request if you are unable to make your scheduled surgical appointment, please call us at least a week in advance or as soon as you are aware of a problem sot aht we can cancel or reschedule you.   12. You MAKE TAKE TYLENOL (acetaminophen) for pain as it is not a blood thinner.   13. PLEASE PLAN TO BE IN TOWN FOR TWO WEEKS FOLLOWING SURGERY, THIS IS IMPORTANT SO YOU CAN BE CHECKED FOR DRESSING CHANGES, SUTURE REMOVAL AND TO MONITOR FOR POSSIBLE COMPLICATIONS.   

## 2019-12-27 NOTE — Progress Notes (Signed)
   New Patient Visit  Subjective  Lynn Taylor is a 74 y.o. female who presents for the following: spots/blackheads (back, chest, face - some irritated).   The following portions of the chart were reviewed this encounter and updated as appropriate:      Review of Systems:  No other skin or systemic complaints except as noted in HPI or Assessment and Plan.  Objective  Well appearing patient in no apparent distress; mood and affect are within normal limits.  A focused examination was performed including face, chest, back. Relevant physical exam findings are noted in the Assessment and Plan.  Objective  Right Spinal Upper Back, R lateral mid back, L spinal lower back, R zygoma, R lower sternum: R spinal upper back 1.0cm firm sq nodule, R lat mid back 0.6cm firm sq nodule with punctum, L spinal lower back 3.101mm dilated pore with underlying firm papule, R zygoma 7.81mm firm white sq nodule, R lower sternum 7.58mm firm sq nodule.   Assessment & Plan  Epidermal inclusion cyst Right Spinal Upper Back, R lateral mid back, L spinal lower back, R zygoma, R lower sternum  Cyst with symptoms and/or recent change.  Discussed surgical excision to remove, including resulting scar and possible recurrence.  Patient will schedule for surgery. Pre-op information given. Will remove right spinal upper back and right lateral mid back since those are symptomatic.  Return for surgery x 2 (1 week apart), cyst x 2 - R spinal upper back, R lat mid back.  IJamesetta Orleans, CMA, am acting as scribe for Brendolyn Patty, MD .  Documentation: I have reviewed the above documentation for accuracy and completeness, and I agree with the above.  Brendolyn Patty MD

## 2020-01-24 ENCOUNTER — Encounter: Payer: Self-pay | Admitting: Internal Medicine

## 2020-01-24 NOTE — Telephone Encounter (Signed)
According to the Patient, Mosquitoes bit me last week and some bites are surrounded by red and some have turned into blisters.  Picture attached  Please advise

## 2020-01-26 ENCOUNTER — Other Ambulatory Visit: Payer: Self-pay

## 2020-01-26 ENCOUNTER — Encounter: Payer: Self-pay | Admitting: Internal Medicine

## 2020-01-26 ENCOUNTER — Ambulatory Visit (INDEPENDENT_AMBULATORY_CARE_PROVIDER_SITE_OTHER): Payer: Medicare Other | Admitting: Internal Medicine

## 2020-01-26 VITALS — BP 152/72 | HR 83 | Temp 98.3°F | Ht 62.99 in | Wt 171.4 lb

## 2020-01-26 DIAGNOSIS — L299 Pruritus, unspecified: Secondary | ICD-10-CM

## 2020-01-26 DIAGNOSIS — R238 Other skin changes: Secondary | ICD-10-CM

## 2020-01-26 MED ORDER — DOXYCYCLINE HYCLATE 100 MG PO TABS: 100.0000 mg | ORAL_TABLET | Freq: Two times a day (BID) | ORAL | 0 refills | Status: DC

## 2020-01-26 MED ORDER — MUPIROCIN 2 % EX OINT: 1.0000 "application " | TOPICAL_OINTMENT | Freq: Two times a day (BID) | CUTANEOUS | 0 refills | Status: DC

## 2020-01-26 MED ORDER — HYDROCORTISONE 2.5 % EX OINT: TOPICAL_OINTMENT | Freq: Two times a day (BID) | CUTANEOUS | 0 refills | Status: DC

## 2020-01-26 NOTE — Patient Instructions (Addendum)
Hibiclens or Chlorhexadine antibacterial soap   Blisters, Adult  A blister is a raised bubble of skin filled with liquid. Blisters often develop in an area of the skin that repeatedly rubs or presses against another surface (friction blister). Friction blisters can occur on any part of the body, but they usually develop on the hands or feet. Long-term pressure on the same area of the skin can also lead to areas of hardened skin (calluses). What are the causes? A blister can be caused by:  An injury.  A burn.  An allergic reaction.  An infection.  Exposure to irritating chemicals.  Friction, especially in an area with a lot of heat and moisture. Friction blisters often result from:  Sports.  Repetitive activities.  Using tools and doing other activities without wearing gloves.  Shoes that are too tight or too loose. What are the signs or symptoms? A blister is often round and looks like a bump. It may:  Itch.  Be painful to the touch. Before a blister forms, the skin may:  Become red.  Feel warm.  Itch.  Be painful to the touch. How is this diagnosed? A blister is diagnosed with a physical exam. How is this treated? Treatment usually involves protecting the area where the blister has formed until the skin has healed. Other treatments may include:  A bandage (dressing) to cover the blister.  Extra padding around and over the blister, so that it does not rub on anything.  Antibiotic ointment. Most blisters break open, dry up, and go away on their own within 1-2 weeks. Blisters that are very painful may be drained before they break open on their own. If the blister is large or painful, it can be drained by: 1. Sterilizing a small needle with rubbing alcohol. 2. Washing your hands with soap and water. 3. Inserting the needle in the edge of the blister to make a small hole. Some fluid will drain out of the hole. Let the top or roof of the blister stay in place. This  helps the skin heal. 4. Washing the blister with mild soap and water. 5. Covering the blister with antibiotic ointment, if prescribed by your health care provider, and a dressing. Some blisters may need to be drained by a health care provider. Follow these instructions at home:  Protect the area where the blister has formed as told by your health care provider.  Keep your blister clean and dry. This helps to prevent infection.  Do not pop your blister. This can cause infection.  If you were prescribed an antibiotic, use it as told by your health care provider. Do not stop using the antibiotic even if your condition improves.  Wear different shoes until the blister heals.  Avoid the activity that caused the blister until your blister heals.  Check your blister every day for signs of infection. Check for: ? More redness, swelling, or pain. ? More fluid or blood. ? Warmth. ? Pus or a bad smell. ? The blister getting better and then getting worse. How is this prevented? Taking these steps can help to prevent blisters that are caused by friction. Make sure you:  Wear comfortable shoes that fit well.  Always wear socks with shoes.  Wear extra socks or use tape, bandages, or pads over blister-prone areas as needed. You may also apply petroleum jelly under bandages in blister-prone areas.  Wear protective gear, such as gloves, when participating in sports or activities that can cause blisters.  Wear loose-fitting, moisture-wicking clothes when participating in sports or activities.  Use powders as needed to keep your feet dry. Contact a health care provider if:  You have more redness, swelling, or pain around your blister.  You have more fluid or blood coming from your blister.  Your blister feels warm to the touch.  You have pus or a bad smell coming from your blister.  You have a fever or chills.  Your blister gets better and then it gets worse. This information is not  intended to replace advice given to you by your health care provider. Make sure you discuss any questions you have with your health care provider. Document Revised: 05/08/2017 Document Reviewed: 12/07/2015 Elsevier Patient Education  2020 [employer].

## 2020-01-26 NOTE — Progress Notes (Signed)
Chief Complaint  Patient presents with  . Insect Bite    Pt c/o insect bite to left thigh and right ankle. Both bites have a blister formed.    F/u bites multiple to left lower leg largest 1.5 cm and right 0.5 cm blister with itching and fluid filled since 1-2 weeks tried otc calamine/dryl and helped she thinks mosquitos could have bitten her but not sure also tried [facility] otc   Review of Systems  Respiratory: Negative for shortness of breath.   Cardiovascular: Negative for chest pain.  Skin: Positive for itching.       +bug bites   Past Medical History:  Diagnosis Date  . Anemia due to antineoplastic chemotherapy 11/25/2018  . Cancer ([place])   . GERD (gastroesophageal reflux disease)   . Hypertension   . Hypomagnesemia 11/25/2018  . Hypothyroidism   . Malignant neoplasm of head of pancreas ([place]) 06/08/2018  . Personal history of chemotherapy 2019    Pancreatic cancer   Past Surgical History:  Procedure Laterality Date  . CHOLECYSTECTOMY    . COLONOSCOPY N/A 12/02/2016   Procedure: COLONOSCOPY;  Surgeon: Lollie Sails, MD;  Location: [facility] ENDOSCOPY;  Service: Endoscopy;  Laterality: N/A;  . ERCP N/A 04/30/2018   Procedure: ENDOSCOPIC RETROGRADE CHOLANGIOPANCREATOGRAPHY (ERCP);  Surgeon: Lucilla Lame, MD;  Location: [facility] ENDOSCOPY;  Service: Endoscopy;  Laterality: N/A;  . ERCP N/A 05/05/2018   Procedure: ENDOSCOPIC RETROGRADE CHOLANGIOPANCREATOGRAPHY (ERCP);  Surgeon: Lucilla Lame, MD;  Location: [facility] ENDOSCOPY;  Service: Endoscopy;  Laterality: N/A;  . PORTA CATH INSERTION N/A 06/28/2018   Procedure: PORTA CATH INSERTION;  Surgeon: Algernon Huxley, MD;  Location: [place] CV LAB;  Service: Cardiovascular;  Laterality: N/A;   Family History  Adopted: Yes  Problem Relation Age of Onset  . Diabetes Mellitus I Other   . Alcoholism Other   . Hypertension Other   . Hyperlipidemia Other   . Coronary artery disease Other   . Stroke Other   . Osteoarthritis Other   . Migraines  Other   . Heart Problems Mother   . Stroke Sister   . CAD Brother   . Stroke Brother   . Breast cancer Neg Hx    Social History   Socioeconomic History  . Marital status: Married    Spouse name: Not on file  . Number of children: Not on file  . Years of education: Not on file  . Highest education level: Not on file  Occupational History  . Not on file  Tobacco Use  . Smoking status: Former Smoker    Quit date: 04/14/1988    Years since quitting: 31.8  . Smokeless tobacco: Never Used  Vaping Use  . Vaping Use: Never used  Substance and Sexual Activity  . Alcohol use: Yes    Comment: social drinker  . Drug use: No  . Sexual activity: Not Currently  Other Topics Concern  . Not on file  Social History Narrative   Lives at home married    In order of [place] star   Social Determinants of Health   Financial Resource Strain: Low Risk   . Difficulty of Paying Living Expenses: Not hard at all  Food Insecurity: No Food Insecurity  . Worried About Charity fundraiser in the Last Year: Never true  . Ran Out of Food in the Last Year: Never true  Transportation Needs: No Transportation Needs  . Lack of Transportation (Medical): No  . Lack of Transportation (Non-Medical): No  Physical Activity: Sufficiently Active  . Days of Exercise per Week: 5 days  . Minutes of Exercise per Session: 30 min  Stress: No Stress Concern Present  . Feeling of Stress : Not at all  Social Connections:   . Frequency of Communication with Friends and Family: Not on file  . Frequency of Social Gatherings with Friends and Family: Not on file  . Attends Religious Services: Not on file  . Active Member of Clubs or Organizations: Not on file  . Attends Archivist Meetings: Not on file  . Marital Status: Not on file  Intimate Partner Violence: Not At Risk  . Fear of Current or Ex-Partner: No  . Emotionally Abused: No  . Physically Abused: No  . Sexually Abused: No   Current Meds    Medication Sig  . acetaminophen-codeine (TYLENOL #3) 300-30 MG tablet Take 1 tablet by mouth every 4 (four) hours as needed for moderate pain.  Marland Kitchen ascorbic acid (VITAMIN C) 1000 MG tablet Take by mouth.  Marland Kitchen aspirin (ASPIRIN 81) 81 MG EC tablet Take 2 tablets (162 mg total) by mouth daily. Swallow whole.  Marland Kitchen azithromycin (ZITHROMAX) 250 MG tablet 2 pills day 1 and 1 pill day 2-5 with food  . Cholecalciferol (VITAMIN D-3) 25 MCG (1000 UT) CAPS Take by mouth.  . CREON 36000-114000 units CPEP capsule TAKE 1 CAPSULE BY MOUTH THREE TIMES DAILY WITH MEALS  . Dextromethorphan-guaiFENesin (MUCINEX DM MAXIMUM STRENGTH) 60-1200 MG TB12 Take 1 tablet by mouth 2 (two) times daily as needed. GREEN LABEL  . diclofenac Sodium (VOLTAREN) 1 % GEL Apply topically 4 (four) times daily.  . fluticasone (FLONASE) 50 MCG/ACT nasal spray Place 2 sprays into both nostrils daily. After nasal saline  . gabapentin (NEURONTIN) 400 MG capsule Take 1 capsule (400 mg total) by mouth 3 (three) times daily.  . hydrochlorothiazide (HYDRODIURIL) 12.5 MG tablet Take 1 tablet (12.5 mg total) by mouth daily. In am  . levothyroxine (SYNTHROID) 112 MCG tablet Take 1 tablet (112 mcg total) by mouth daily before breakfast.  . loperamide (IMODIUM) 2 MG capsule Take 1 capsule (2 mg total) by mouth See admin instructions. With onset of loose stool, take 4mg  followed by 2mg  every 2 hours until 12 hours have passed without loose bowel movement. Maximum: 16 mg/day  . Magnesium Cl-Calcium Carbonate (SLOW-MAG PO) Take by mouth. 2 QD  . Multiple Vitamins-Minerals (CENTRUM SILVER 50+WOMEN PO) Take by mouth.  . ondansetron (ZOFRAN) 4 MG tablet TAKE 1 TABLET BY MOUTH  EVERY 6 HOURS AS NEEDED FOR NAUSEA OR VOMITING.  . potassium chloride (KLOR-CON) 10 MEQ tablet Take 1 tablet (10 mEq total) by mouth daily.  . prochlorperazine (COMPAZINE) 10 MG tablet TAKE 1 TABLET BY MOUTH  EVERY 6 HOURS AS NEEDED  . Simethicone 125 MG CAPS Take by mouth.  . sodium  chloride (OCEAN) 0.65 % SOLN nasal spray Place 2 sprays into both nostrils daily as needed for congestion.   No Known Allergies Recent Results (from the past 2160 hour(s))  Cancer antigen 19-9     Status: None   Collection Time: 11/02/19  1:18 PM  Result Value Ref Range   CA 19-9 16 0 - 35 U/mL    Comment: (NOTE) Roche Diagnostics Electrochemiluminescence Immunoassay (ECLIA) Values obtained with different assay methods or kits cannot be used interchangeably.  Results cannot be interpreted as absolute evidence of the presence or absence of malignant disease. Performed At: [facility] [address] [place], [place] [id number]  Rush Farmer MD WH:[id number]   Comprehensive metabolic panel     Status: Abnormal   Collection Time: 11/02/19  1:18 PM  Result Value Ref Range   Sodium 136 135 - 145 mmol/L   Potassium 3.6 3.5 - 5.1 mmol/L   Chloride 103 98 - 111 mmol/L   CO2 28 22 - 32 mmol/L   Glucose, Bld 102 (H) 70 - 99 mg/dL    Comment: Glucose reference range applies only to samples taken after fasting for at least 8 hours.   BUN 21 8 - 23 mg/dL   Creatinine, Ser 0.83 0.44 - 1.00 mg/dL   Calcium 9.3 8.9 - 10.3 mg/dL   Total Protein 7.7 6.5 - 8.1 g/dL   Albumin 3.9 3.5 - 5.0 g/dL   AST 21 15 - 41 U/L   ALT 20 0 - 44 U/L   Alkaline Phosphatase 81 38 - 126 U/L   Total Bilirubin 0.5 0.3 - 1.2 mg/dL   GFR calc non Af Amer >60 >60 mL/min   GFR calc Af Amer >60 >60 mL/min   Anion gap 5 5 - 15    Comment: Performed at [facility], [address] [place], [place] [address]  CBC with Differential/Platelet     Status: Abnormal   Collection Time: 11/02/19  1:18 PM  Result Value Ref Range   WBC 7.0 4.0 - 10.5 K/uL   RBC 3.82 (L) 3.87 - 5.11 MIL/uL   Hemoglobin 10.7 (L) 12.0 - 15.0 g/dL   HCT 32.7 (L) 36 - 46 %   MCV 85.6 80.0 - 100.0 fL   MCH 28.0 26.0 - 34.0 pg   MCHC 32.7 30.0 - 36.0 g/dL   RDW 14.6 11.5 - 15.5 %   Platelets 177 150 - 400 K/uL   nRBC 0.0 0.0 -  0.2 %   Neutrophils Relative % 60 %   Neutro Abs 4.1 1.7 - 7.7 K/uL   Lymphocytes Relative 32 %   Lymphs Abs 2.2 0.7 - 4.0 K/uL   Monocytes Relative 7 %   Monocytes Absolute 0.5 0 - 1 K/uL   Eosinophils Relative 1 %   Eosinophils Absolute 0.1 0 - 0 K/uL   Basophils Relative 0 %   Basophils Absolute 0.0 0 - 0 K/uL   Immature Granulocytes 0 %   Abs Immature Granulocytes 0.02 0.00 - 0.07 K/uL    Comment: Performed at [facility], [address] [place],  [address]   Objective  Body mass index is 30.37 kg/m. Wt Readings from Last 3 Encounters:  01/26/20 171 lb 6.4 oz (77.7 kg)  11/17/19 161 lb (73 kg)  11/02/19 161 lb 14.4 oz (73.4 kg)   Temp Readings from Last 3 Encounters:  01/26/20 98.3 F (36.8 C)  11/02/19 98.3 F (36.8 C)  09/21/19 (!) 97.2 F (36.2 C) (Temporal)   BP Readings from Last 3 Encounters:  01/26/20 (!) 152/72  11/02/19 128/73  09/21/19 134/88   Pulse Readings from Last 3 Encounters:  01/26/20 83  11/02/19 63  09/21/19 70    Physical Exam Vitals and nursing note reviewed.  Constitutional:      Appearance: Normal appearance. She is well-developed and well-groomed. She is obese.  HENT:     Head: Normocephalic and atraumatic.  Eyes:     Conjunctiva/sclera: Conjunctivae normal.     Pupils: Pupils are equal, round, and reactive to light.  Cardiovascular:     Rate and Rhythm: Normal rate and regular rhythm.  Heart sounds: Normal heart sounds. No murmur heard.   Pulmonary:     Effort: Pulmonary effort is normal.     Breath sounds: Normal breath sounds.  Skin:    General: Skin is warm and dry.       Neurological:     General: No focal deficit present.     Mental Status: She is alert and oriented to person, place, and time. Mental status is at baseline.     Gait: Gait normal.  Psychiatric:        Attention and Perception: Attention and perception normal.        Mood and Affect: Mood and affect normal.        Speech: Speech  normal.        Behavior: Behavior normal. Behavior is cooperative.        Thought Content: Thought content normal.        Cognition and Memory: Cognition and memory normal.        Judgment: Judgment normal.     Assessment  Plan  Blisters of multiple sites - Plan: mupirocin ointment (BACTROBAN) 2 %, hydrocortisone 2.5 % ointment, doxycycline (VIBRA-TABS) 100 MG tablet  Itching - Plan: hydrocortisone 2.5 % ointment   consented pt and used sterile needle to drain blisters left and right lower leg after cleaning with alcohol tolerated and all fluid drained then cleaned with hydrogen peroxide and bandaged.   HM Flu shot declines Prevnar, pna 23 declines  Tdap, shingrix declines  [employer] 2/2  mammo referred due sch 07/26/19 negative Colonoscopy 12/02/16 sessile polyp and tubular adenoma [facility]  Check with GI to see when due No pap due dexa 07/27/15 normal rec centrum silver or nature made mvt with iron   Provider: Dr. Olivia Mackie McLean-[facility]oc[facility]

## 2020-01-30 ENCOUNTER — Other Ambulatory Visit: Payer: Self-pay | Admitting: Dermatology

## 2020-01-30 ENCOUNTER — Ambulatory Visit: Payer: Medicare Other | Admitting: Dermatology

## 2020-01-30 ENCOUNTER — Other Ambulatory Visit: Payer: Self-pay

## 2020-01-30 DIAGNOSIS — D485 Neoplasm of uncertain behavior of skin: Secondary | ICD-10-CM | POA: Diagnosis not present

## 2020-01-30 NOTE — Progress Notes (Signed)
   Follow-Up Visit   Subjective  Lynn Taylor is a 74 y.o. female who presents for the following: Cyst (Patient here for excision on right spinal upper back.).   The following portions of the chart were reviewed this encounter and updated as appropriate:      Review of Systems:  No other skin or systemic complaints except as noted in HPI or Assessment and Plan.  Objective  Well appearing patient in no apparent distress; mood and affect are within normal limits.  A focused examination was performed including back. Relevant physical exam findings are noted in the Assessment and Plan.  Objective  Right Spinal Upper Back:  1.4 x 0.9cm firm subcutaneous nodule.   Assessment & Plan  Neoplasm of uncertain behavior of skin Right Spinal Upper Back  Skin excision  Lesion length (cm):  1.4 Lesion width (cm):  0.9 Margin per side (cm):  0 Total excision diameter (cm):  1.4 Informed consent: discussed and consent obtained   Timeout: patient name, date of birth, surgical site, and procedure verified   Procedure prep:  Patient was prepped and draped in usual sterile fashion Prep type:  Povidone-iodine Anesthesia: the lesion was anesthetized in a standard fashion   Anesthetic:  1% lidocaine w/ epinephrine 1-100,000 buffered w/ 8.4% NaHCO3 (11cc) Instrument used: #15 blade   Hemostasis achieved with: pressure and electrodesiccation   Outcome: patient tolerated procedure well with no complications    Skin repair Complexity:  Intermediate Final length (cm):  2 Reason for type of repair: reduce tension to allow closure, reduce the risk of dehiscence, infection, and necrosis and reduce subcutaneous dead space and avoid a hematoma   Undermining: edges could be approximated without difficulty and edges undermined   Subcutaneous layers (deep stitches):  Suture size:  3-0 Suture type: Vicryl (polyglactin 910)   Stitches:  Buried vertical mattress Fine/surface layer approximation (top  stitches):  Suture size:  3-0 Suture type: nylon   Stitches: simple interrupted   Suture removal (days):  7 Hemostasis achieved with: suture Outcome: patient tolerated procedure well with no complications   Post-procedure details: sterile dressing applied and wound care instructions given   Dressing type: pressure dressing (mupirocin)    Specimen 1 - Surgical pathology Differential Diagnosis: Cyst vs other Check Margins: No Firm subcutaneous nodule.  Return in about 1 week (around 02/06/2020) for SR and cyst excision. Pt already has appt.Lindi Adie, CMA, am acting as scribe for Brendolyn Patty, MD .  Documentation: I have reviewed the above documentation for accuracy and completeness, and I agree with the above.  Brendolyn Patty MD

## 2020-01-30 NOTE — Patient Instructions (Signed)

## 2020-01-31 ENCOUNTER — Telehealth: Payer: Self-pay

## 2020-01-31 NOTE — Telephone Encounter (Signed)
Talked to patient and she is doing fine from surgery yesterday. She will call with any questions or concerns.

## 2020-02-03 ENCOUNTER — Ambulatory Visit (INDEPENDENT_AMBULATORY_CARE_PROVIDER_SITE_OTHER): Payer: Medicare Other

## 2020-02-03 VITALS — Ht 62.99 in | Wt 171.0 lb

## 2020-02-03 DIAGNOSIS — Z0000 Encounter for general adult medical examination without abnormal findings: Secondary | ICD-10-CM | POA: Diagnosis not present

## 2020-02-03 NOTE — Progress Notes (Signed)
Subjective:   Lynn Taylor is a 74 y.o. female who presents for Medicare Annual (Subsequent) preventive examination.  Review of Systems    No ROS.  Medicare Wellness Virtual Visit.   Cardiac Risk Factors include: advanced age (>36men, >57 women);hypertension     Objective:    Today's Vitals   02/03/20 0942  Weight: 171 lb (77.6 kg)  Height: 5' 2.99" (1.6 m)   Body mass index is 30.3 kg/m.  Advanced Directives 02/03/2020 11/02/2019 02/23/2019 02/02/2019 12/29/2018 12/15/2018 12/01/2018  Does Patient Have a Medical Advance Directive? No No No No No No No  Would patient like information on creating a medical advance directive? No - Patient declined No - Patient declined No - Patient declined No - Patient declined - - -    Current Medications (verified) Outpatient Encounter Medications as of 02/03/2020  Medication Sig  . acetaminophen-codeine (TYLENOL #3) 300-30 MG tablet Take 1 tablet by mouth every 4 (four) hours as needed for moderate pain.  Marland Kitchen ascorbic acid (VITAMIN C) 1000 MG tablet Take by mouth.  Marland Kitchen aspirin (ASPIRIN 81) 81 MG EC tablet Take 2 tablets (162 mg total) by mouth daily. Swallow whole.  Marland Kitchen azithromycin (ZITHROMAX) 250 MG tablet 2 pills day 1 and 1 pill day 2-5 with food  . Cholecalciferol (VITAMIN D-3) 25 MCG (1000 UT) CAPS Take by mouth.  . CREON 36000-114000 units CPEP capsule TAKE 1 CAPSULE BY MOUTH THREE TIMES DAILY WITH MEALS  . Dextromethorphan-guaiFENesin (MUCINEX DM MAXIMUM STRENGTH) 60-1200 MG TB12 Take 1 tablet by mouth 2 (two) times daily as needed. GREEN LABEL  . diclofenac Sodium (VOLTAREN) 1 % GEL Apply topically 4 (four) times daily.  Marland Kitchen doxycycline (VIBRA-TABS) 100 MG tablet Take 1 tablet (100 mg total) by mouth 2 (two) times daily. With food  . fluticasone (FLONASE) 50 MCG/ACT nasal spray Place 2 sprays into both nostrils daily. After nasal saline  . gabapentin (NEURONTIN) 400 MG capsule Take 1 capsule (400 mg total) by mouth 3 (three) times daily.  .  hydrochlorothiazide (HYDRODIURIL) 12.5 MG tablet Take 1 tablet (12.5 mg total) by mouth daily. In am  . hydrocortisone 2.5 % ointment Apply topically 2 (two) times daily. Prn  . levothyroxine (SYNTHROID) 112 MCG tablet Take 1 tablet (112 mcg total) by mouth daily before breakfast.  . loperamide (IMODIUM) 2 MG capsule Take 1 capsule (2 mg total) by mouth See admin instructions. With onset of loose stool, take 4mg  followed by 2mg  every 2 hours until 12 hours have passed without loose bowel movement. Maximum: 16 mg/day  . Magnesium Cl-Calcium Carbonate (SLOW-MAG PO) Take by mouth. 2 QD  . Multiple Vitamins-Minerals (CENTRUM SILVER 50+WOMEN PO) Take by mouth.  . mupirocin ointment (BACTROBAN) 2 % Apply 1 application topically 2 (two) times daily. X 5-7 days  . ondansetron (ZOFRAN) 4 MG tablet TAKE 1 TABLET BY MOUTH  EVERY 6 HOURS AS NEEDED FOR NAUSEA OR VOMITING.  . potassium chloride (KLOR-CON) 10 MEQ tablet Take 1 tablet (10 mEq total) by mouth daily.  . prochlorperazine (COMPAZINE) 10 MG tablet TAKE 1 TABLET BY MOUTH  EVERY 6 HOURS AS NEEDED  . Simethicone 125 MG CAPS Take by mouth.  . sodium chloride (OCEAN) 0.65 % SOLN nasal spray Place 2 sprays into both nostrils daily as needed for congestion.   No facility-administered encounter medications on file as of 02/03/2020.    Allergies (verified) Patient has no known allergies.   History: Past Medical History:  Diagnosis Date  . Anemia  due to antineoplastic chemotherapy 11/25/2018  . Cancer ([place])   . GERD (gastroesophageal reflux disease)   . Hypertension   . Hypomagnesemia 11/25/2018  . Hypothyroidism   . Malignant neoplasm of head of pancreas ([place]) 06/08/2018  . Personal history of chemotherapy 2019    Pancreatic cancer   Past Surgical History:  Procedure Laterality Date  . CHOLECYSTECTOMY    . COLONOSCOPY N/A 12/02/2016   Procedure: COLONOSCOPY;  Surgeon: Lollie Sails, MD;  Location: [facility] ENDOSCOPY;  Service: Endoscopy;   Laterality: N/A;  . ERCP N/A 04/30/2018   Procedure: ENDOSCOPIC RETROGRADE CHOLANGIOPANCREATOGRAPHY (ERCP);  Surgeon: Lucilla Lame, MD;  Location[facility] ENDOSCOPY;  Service: Endoscopy;  Laterality: N/A;  . ERCP N/A 05/05/2018   Procedure: ENDOSCOPIC RETROGRADE CHOLANGIOPANCREATOGRAPHY (ERCP);  Surgeon: Lucilla Lame, MD;  Location: [facility] ENDOSCOPY;  Service: Endoscopy;  Laterality: N/A;  . PORTA CATH INSERTION N/A 06/28/2018   Procedure: PORTA CATH INSERTION;  Surgeon: Algernon Huxley, MD;  Location: [facility] LAB;  Service: Cardiovascular;  Laterality: N/A;   Family History  Adopted: Yes  Problem Relation Age of Onset  . Diabetes Mellitus I Other   . Alcoholism Other   . Hypertension Other   . Hyperlipidemia Other   . Coronary artery disease Other   . Stroke Other   . Osteoarthritis Other   . Migraines Other   . Heart Problems Mother   . Stroke Sister   . CAD Brother   . Stroke Brother   . Breast cancer Neg Hx    Social History   Socioeconomic History  . Marital status: Married    Spouse name: Not on file  . Number of children: Not on file  . Years of education: Not on file  . Highest education level: Not on file  Occupational History  . Not on file  Tobacco Use  . Smoking status: Former Smoker    Quit date: 04/14/1988    Years since quitting: 31.8  . Smokeless tobacco: Never Used  Vaping Use  . Vaping Use: Never used  Substance and Sexual Activity  . Alcohol use: Yes    Comment: social drinker  . Drug use: No  . Sexual activity: Not Currently  Other Topics Concern  . Not on file  Social History Narrative   Lives at home married    In order of [place] star   Social Determinants of Health   Financial Resource Strain: Low Risk   . Difficulty of Paying Living Expenses: Not hard at all  Food Insecurity: No Food Insecurity  . Worried About Charity fundraiser in the Last Year: Never true  . Ran Out of Food in the Last Year: Never true  Transportation Needs: No  Transportation Needs  . Lack of Transportation (Medical): No  . Lack of Transportation (Non-Medical): No  Physical Activity:   . Days of Exercise per Week: Not on file  . Minutes of Exercise per Session: Not on file  Stress: No Stress Concern Present  . Feeling of Stress : Not at all  Social Connections: Unknown  . Frequency of Communication with Friends and Family: Not on file  . Frequency of Social Gatherings with Friends and Family: Not on file  . Attends Religious Services: Not on file  . Active Member of Clubs or Organizations: Not on file  . Attends Archivist Meetings: Not on file  . Marital Status: Married    Tobacco Counseling Counseling given: Not Answered   Clinical Intake:  Pre-visit  preparation completed: Yes        Diabetes: No  How often do you need to have someone help you when you read instructions, pamphlets, or other written materials from your doctor or pharmacy?: 1 - Never Interpreter Needed?: No      Activities of Daily Living In your present state of health, do you have any difficulty performing the following activities: 02/03/2020  Hearing? N  Vision? N  Difficulty concentrating or making decisions? N  Walking or climbing stairs? N  Dressing or bathing? N  Doing errands, shopping? N  Preparing Food and eating ? N  Using the Toilet? N  In the past six months, have you accidently leaked urine? N  Do you have problems with loss of bowel control? N  Managing your Medications? N  Managing your Finances? N  Housekeeping or managing your Housekeeping? N  Some recent data might be hidden    Patient Care Team: McLean-Scocuzza, Nino Glow, MD as PCP - General (Internal Medicine) Clent Jacks, RN as Registered Nurse Jonathon Bellows, MD as Consulting Physician (Gastroenterology) Clent Jacks, RN as Oncology Nurse Navigator Earlie Server, MD as Consulting Physician (Oncology) Fayrene Helper Stacie Acres, MD as Referring Physician (Oncology)  Lucky Cowboy Erskine Squibb, MD as Referring Physician (Vascular Surgery)  Indicate any recent Medical Services you may have received from other than [facility] providers in the past year (date may be approximate).     Assessment:   This is a routine wellness examination for [facility].  I connected with Lynn Taylor today by telephone and verified that I am speaking with the correct person using two identifiers. Location patient: home Location provider: work Persons participating in the virtual visit: patient, Marine scientist.    I discussed the limitations, risks, security and privacy concerns of performing an evaluation and management service by telephone and the availability of in person appointments. The patient expressed understanding and verbally consented to this telephonic visit.    Interactive audio and video telecommunications were attempted between this provider and patient, however failed, due to patient having technical difficulties OR patient did not have access to video capability.  We continued and completed visit with audio only.  Some vital signs may be absent or patient reported.   Hearing/Vision screen  Hearing Screening   125Hz  250Hz  500Hz  1000Hz  2000Hz  3000Hz  4000Hz  6000Hz  8000Hz   Right ear:           Left ear:           Comments: Patient is able to hear conversational tones without difficulty.  No issues reported.  Vision Screening Comments: Wears corrective lenses Visual acuity not assessed, virtual visit.  They have seen their ophthalmologist.   Dietary issues and exercise activities discussed: Current Exercise Habits: Home exercise routine, Intensity: Mild  Regular diet  Goals      Patient Stated   .  Follow up with Provider as scheduled (pt-stated)      Depression Screen PHQ 2/9 Scores 02/03/2020 01/26/2020 11/17/2019 09/21/2019 04/29/2019 02/02/2019 04/09/2018  PHQ - 2 Score 0 0 0 0 0 0 0  PHQ- 9 Score - - - - - - -    Fall Risk Fall Risk  02/03/2020 11/17/2019 09/21/2019 04/29/2019 02/03/2019   Falls in the past year? 0 0 0 0 0  Number falls in past yr: 0 - 0 0 -  Injury with Fall? - - 0 - -  Follow up Falls evaluation completed - Falls evaluation completed Falls evaluation completed Falls evaluation completed  Handrails in use when climbing stairs? Yes  Home free of loose throw rugs in walkways, pet beds, electrical cords, etc? Yes  Adequate lighting in your home to reduce risk of falls? Yes   ASSISTIVE DEVICES UTILIZED TO PREVENT FALLS:  Life alert? No  Use of a cane, walker or w/c? No   TIMED UP AND GO:  Was the test performed? No . Virtual visit.    Cognitive Function: Patient is alert and oriented x3. Denies difficulty with memory loss, focusing, making decisions.   Patient enjoys reading, jigsaw puzzles, word puzzles and sewing for brain health.     Immunizations Immunization History  Administered Date(s) Administered  . PFIZER SARS-COV-2 Vaccination 07/21/2019, 08/11/2019    Health Maintenance There are no preventive care reminders to display for this patient. Health Maintenance  Topic Date Due  . MAMMOGRAM  07/25/2021  . COLONOSCOPY  12/03/2026  . DEXA SCAN  Completed  . COVID-19 Vaccine  Completed  . Hepatitis C Screening  Completed  . INFLUENZA VACCINE  Discontinued  . TETANUS/TDAP  Discontinued  . PNA vac Low Risk Adult  Discontinued    Dental Screening: Recommended annual dental exams for proper oral hygiene. Dentures.   Community Resource Referral / Chronic Care Management: CRR required this visit?  No   CCM required this visit?  No      Plan:   Keep all routine maintenance appointments.   Follow up 03/22/20 @ 8:30  I have personally reviewed and noted the following in the patient's chart:   . Medical and social history . Use of alcohol, tobacco or illicit drugs  . Current medications and supplements . Functional ability and status . Nutritional status . Physical activity . Advanced directives . List of other physicians .  Hospitalizations, surgeries, and ER visits in previous 12 months . Vitals . Screenings to include cognitive, depression, and falls . Referrals and appointments  In addition, I have reviewed and discussed with patient certain preventive protocols, quality metrics, and best practice recommendations. A written personalized care plan for preventive services as well as general preventive health recommendations were provided to patient via mychart.     Varney Biles, LPN   0/23/3435

## 2020-02-03 NOTE — Patient Instructions (Addendum)
Lynn Taylor , Thank you for taking time to come for your Medicare Wellness Visit. I appreciate your ongoing commitment to your health goals. Please review the following plan we discussed and let me know if I can assist you in the future.   These are the goals we discussed: Goals      Patient Stated   .  Follow up with Provider as scheduled (pt-stated)       This is a list of the screening recommended for you and due dates:  Health Maintenance  Topic Date Due  . Mammogram  07/25/2021  . Colon Cancer Screening  12/03/2026  . DEXA scan (bone density measurement)  Completed  . COVID-19 Vaccine  Completed  .  Hepatitis C: One time screening is recommended by Center for Disease Control  (CDC) for  adults born from [birth year] through 1965.   Completed  . Flu Shot  Discontinued  . Tetanus Vaccine  Discontinued  . Pneumonia vaccines  Discontinued    Immunizations Immunization History  Administered Date(s) Administered  . PFIZER SARS-COV-2 Vaccination 07/21/2019, 08/11/2019   Advanced directives: declined  Conditions/risks identified: none new  Next appointment: Follow up in one year for your annual wellness visit   Keep all routine maintenance appointments.   Follow up 03/22/20 @ 8:30  Preventive Care 65 Years and Older, Female Preventive care refers to lifestyle choices and visits with your health care provider that can promote health and wellness. What does preventive care include?  A yearly physical exam. This is also called an annual well check.  Dental exams once or twice a year.  Routine eye exams. Ask your health care provider how often you should have your eyes checked.  Personal lifestyle choices, including:  Daily care of your teeth and gums.  Regular physical activity.  Eating a healthy diet.  Avoiding tobacco and drug use.  Limiting alcohol use.  Practicing safe sex.  Taking low-dose aspirin every day.  Taking vitamin and mineral supplements as recommended  by your health care provider. What happens during an annual well check? The services and screenings done by your health care provider during your annual well check will depend on your age, overall health, lifestyle risk factors, and family history of disease. Counseling  Your health care provider may ask you questions about your:  Alcohol use.  Tobacco use.  Drug use.  Emotional well-being.  Home and relationship well-being.  Sexual activity.  Eating habits.  History of falls.  Memory and ability to understand (cognition).  Work and work Statistician.  Reproductive health. Screening  You may have the following tests or measurements:  Height, weight, and BMI.  Blood pressure.  Lipid and cholesterol levels. These may be checked every 5 years, or more frequently if you are over 47 years old.  Skin check.  Lung cancer screening. You may have this screening every year starting at age 19 if you have a 30-pack-year history of smoking and currently smoke or have quit within the past 15 years.  Fecal occult blood test (FOBT) of the stool. You may have this test every year starting at age 66.  Flexible sigmoidoscopy or colonoscopy. You may have a sigmoidoscopy every 5 years or a colonoscopy every 10 years starting at age 95.  Hepatitis C blood test.  Hepatitis B blood test.  Sexually transmitted disease (STD) testing.  Diabetes screening. This is done by checking your blood sugar (glucose) after you have not eaten for a while (fasting). You may have  this done every 1-3 years.  Bone density scan. This is done to screen for osteoporosis. You may have this done starting at age 61.  Mammogram. This may be done every 1-2 years. Talk to your health care provider about how often you should have regular mammograms. Talk with your health care provider about your test results, treatment options, and if necessary, the need for more tests. Vaccines  Your health care provider may  recommend certain vaccines, such as:  Influenza vaccine. This is recommended every year.  Tetanus, diphtheria, and acellular pertussis (Tdap, Td) vaccine. You may need a Td booster every 10 years.  Zoster vaccine. You may need this after age 61.  Pneumococcal 13-valent conjugate (PCV13) vaccine. One dose is recommended after age 15.  Pneumococcal polysaccharide (PPSV23) vaccine. One dose is recommended after age 71. Talk to your health care provider about which screenings and vaccines you need and how often you need them. This information is not intended to replace advice given to you by your health care provider. Make sure you discuss any questions you have with your health care provider. Document Released: 06/22/2015 Document Revised: 02/13/2016 Document Reviewed: 03/27/2015 Elsevier Interactive Patient Education  2017 [place] Prevention in the Home Falls can cause injuries. They can happen to people of all ages. There are many things you can do to make your home safe and to help prevent falls. What can I do on the outside of my home?  Regularly fix the edges of walkways and driveways and fix any cracks.  Remove anything that might make you trip as you walk through a door, such as a raised step or threshold.  Trim any bushes or trees on the path to your home.  Use bright outdoor lighting.  Clear any walking paths of anything that might make someone trip, such as rocks or tools.  Regularly check to see if handrails are loose or broken. Make sure that both sides of any steps have handrails.  Any raised decks and porches should have guardrails on the edges.  Have any leaves, snow, or ice cleared regularly.  Use sand or salt on walking paths during winter.  Clean up any spills in your garage right away. This includes oil or grease spills. What can I do in the bathroom?  Use night lights.  Install grab bars by the toilet and in the tub and shower. Do not use towel  bars as grab bars.  Use non-skid mats or decals in the tub or shower.  If you need to sit down in the shower, use a plastic, non-slip stool.  Keep the floor dry. Clean up any water that spills on the floor as soon as it happens.  Remove soap buildup in the tub or shower regularly.  Attach bath mats securely with double-sided non-slip rug tape.  Do not have throw rugs and other things on the floor that can make you trip. What can I do in the bedroom?  Use night lights.  Make sure that you have a light by your bed that is easy to reach.  Do not use any sheets or blankets that are too big for your bed. They should not hang down onto the floor.  Have a firm chair that has side arms. You can use this for support while you get dressed.  Do not have throw rugs and other things on the floor that can make you trip. What can I do in the kitchen?  Clean up any  spills right away.  Avoid walking on wet floors.  Keep items that you use a lot in easy-to-reach places.  If you need to reach something above you, use a strong step stool that has a grab bar.  Keep electrical cords out of the way.  Do not use floor polish or wax that makes floors slippery. If you must use wax, use non-skid floor wax.  Do not have throw rugs and other things on the floor that can make you trip. What can I do with my stairs?  Do not leave any items on the stairs.  Make sure that there are handrails on both sides of the stairs and use them. Fix handrails that are broken or loose. Make sure that handrails are as long as the stairways.  Check any carpeting to make sure that it is firmly attached to the stairs. Fix any carpet that is loose or worn.  Avoid having throw rugs at the top or bottom of the stairs. If you do have throw rugs, attach them to the floor with carpet tape.  Make sure that you have a light switch at the top of the stairs and the bottom of the stairs. If you do not have them, ask someone to  add them for you. What else can I do to help prevent falls?  Wear shoes that:  Do not have high heels.  Have rubber bottoms.  Are comfortable and fit you well.  Are closed at the toe. Do not wear sandals.  If you use a stepladder:  Make sure that it is fully opened. Do not climb a closed stepladder.  Make sure that both sides of the stepladder are locked into place.  Ask someone to hold it for you, if possible.  Clearly mark and make sure that you can see:  Any grab bars or handrails.  First and last steps.  Where the edge of each step is.  Use tools that help you move around (mobility aids) if they are needed. These include:  Canes.  Walkers.  Scooters.  Crutches.  Turn on the lights when you go into a dark area. Replace any light bulbs as soon as they burn out.  Set up your furniture so you have a clear path. Avoid moving your furniture around.  If any of your floors are uneven, fix them.  If there are any pets around you, be aware of where they are.  Review your medicines with your doctor. Some medicines can make you feel dizzy. This can increase your chance of falling. Ask your doctor what other things that you can do to help prevent falls. This information is not intended to replace advice given to you by your health care provider. Make sure you discuss any questions you have with your health care provider. Document Released: 03/22/2009 Document Revised: 11/01/2015 Document Reviewed: 06/30/2014 Elsevier Interactive Patient Education  2017 [employer].

## 2020-02-06 ENCOUNTER — Ambulatory Visit: Payer: Medicare Other | Admitting: Dermatology

## 2020-02-06 ENCOUNTER — Other Ambulatory Visit: Payer: Self-pay | Admitting: Dermatology

## 2020-02-06 ENCOUNTER — Other Ambulatory Visit: Payer: Self-pay

## 2020-02-06 DIAGNOSIS — Z4802 Encounter for removal of sutures: Secondary | ICD-10-CM

## 2020-02-06 DIAGNOSIS — L720 Epidermal cyst: Secondary | ICD-10-CM

## 2020-02-06 DIAGNOSIS — D485 Neoplasm of uncertain behavior of skin: Secondary | ICD-10-CM

## 2020-02-06 NOTE — Patient Instructions (Signed)

## 2020-02-06 NOTE — Progress Notes (Addendum)
   Follow-Up Visit   Subjective  Lynn Taylor is a 74 y.o. female who presents for the following: Suture Removal (R spinal upper back - cyst, margins free) and Cyst (R lateral mid back, excise today).   The following portions of the chart were reviewed this encounter and updated as appropriate:      Review of Systems:  No other skin or systemic complaints except as noted in HPI or Assessment and Plan.  Objective  Well appearing patient in no apparent distress; mood and affect are within normal limits.  A focused examination was performed including back. Relevant physical exam findings are noted in the Assessment and Plan.  Objective  Right Spinal Upper Back: Excision site healing well, no evidence of infection   Objective  Right Lateral Mid Back: 0.9 x 0.6cm Firm subcutaneous nodule.   Assessment & Plan  Epidermal inclusion cyst Right Spinal Upper Back  Wound cleansed, sutures removed, wound cleansed and steri strips applied. Discussed pathology results.   Neoplasm of uncertain behavior of skin Right Lateral Mid Back  Skin excision  Lesion length (cm):  0.9 Lesion width (cm):  0.6 Margin per side (cm):  0.2 Total excision diameter (cm):  1.3 Informed consent: discussed and consent obtained   Timeout: patient name, date of birth, surgical site, and procedure verified   Procedure prep:  Patient was prepped and draped in usual sterile fashion Prep type:  Povidone-iodine Anesthesia: the lesion was anesthetized in a standard fashion   Anesthetic:  1% lidocaine w/ epinephrine 1-100,000 buffered w/ 8.4% NaHCO3 (11cc) Instrument used: #15 blade   Hemostasis achieved with: pressure and electrodesiccation   Outcome: patient tolerated procedure well with no complications    Skin repair Complexity:  Intermediate Final length (cm):  2.6 Reason for type of repair: reduce tension to allow closure, reduce the risk of dehiscence, infection, and necrosis and reduce  subcutaneous dead space and avoid a hematoma   Undermining: edges could be approximated without difficulty and edges undermined   Subcutaneous layers (deep stitches):  Suture size:  3-0 Suture type: Vicryl (polyglactin 910)   Stitches:  Buried vertical mattress Fine/surface layer approximation (top stitches):  Suture size:  3-0 Suture type: nylon   Stitches: simple interrupted   Suture removal (days):  7 Hemostasis achieved with: suture Outcome: patient tolerated procedure well with no complications   Post-procedure details: sterile dressing applied and wound care instructions given   Dressing type: pressure dressing (mupirocin)    Specimen 1 - Surgical pathology Differential Diagnosis: Cyst vs other Check Margins: Yes 0.9 x 0.6cm Firm subcutaneous nodule.  Return in about 8 days (around 02/14/2020) for SR. Schedule surgery for cyst on chest..   I, Jamesetta Orleans, CMA, am acting as scribe for Brendolyn Patty, MD .  Documentation: I have reviewed the above documentation for accuracy and completeness, and I agree with the above.  Brendolyn Patty MD

## 2020-02-07 ENCOUNTER — Telehealth: Payer: Self-pay

## 2020-02-07 NOTE — Telephone Encounter (Signed)
Left patient a message to call office with any questions or problems from surgery yesterday.

## 2020-02-14 ENCOUNTER — Ambulatory Visit (INDEPENDENT_AMBULATORY_CARE_PROVIDER_SITE_OTHER): Payer: Medicare Other | Admitting: Dermatology

## 2020-02-14 ENCOUNTER — Other Ambulatory Visit: Payer: Self-pay

## 2020-02-14 DIAGNOSIS — L729 Follicular cyst of the skin and subcutaneous tissue, unspecified: Secondary | ICD-10-CM

## 2020-02-14 DIAGNOSIS — Z4802 Encounter for removal of sutures: Secondary | ICD-10-CM

## 2020-02-14 NOTE — Progress Notes (Signed)
   Follow-Up Visit   Subjective  Lynn Taylor is a 74 y.o. female who presents for the following: Follow-up (Patient here today for suture removal. ). Cyst.  Pathology results not back yet.  The following portions of the chart were reviewed this encounter and updated as appropriate:      Review of Systems:  No other skin or systemic complaints except as noted in HPI or Assessment and Plan.  Objective  Well appearing patient in no apparent distress; mood and affect are within normal limits.  A focused examination was performed including back. Relevant physical exam findings are noted in the Assessment and Plan.    Assessment & Plan    Encounter for Removal of Sutures - Incision site at the right spinal upper back is clean, dry and intact - Wound cleansed, sutures removed, wound cleansed and steri strips applied.  - Pathology not back yet.  - Patient advised to keep steri-strips dry until they fall off. - Scars remodel for a full year. - Once steri-strips fall off, patient can apply over-the-counter silicone scar cream each night to help with scar remodeling if desired. - Patient advised to call with any concerns or if they notice any new or changing lesions.    Return for Surgery for cyst at right cheek and right breast 1 week apart.  Graciella Belton, RMA, am acting as scribe for Brendolyn Patty, MD . Documentation: I have reviewed the above documentation for accuracy and completeness, and I agree with the above.  Brendolyn Patty MD

## 2020-02-15 ENCOUNTER — Telehealth: Payer: Self-pay

## 2020-02-15 NOTE — Telephone Encounter (Signed)
-----   Message from Brendolyn Patty, MD sent at 02/15/2020  8:47 AM EDT ----- Skin (M), right lateral mid back EXCISION, EPIDERMOID CYST, MARGINS FREE

## 2020-02-15 NOTE — Telephone Encounter (Signed)
Advised pt of pathology results/sh 

## 2020-03-05 ENCOUNTER — Encounter: Payer: Medicare Other | Admitting: Dermatology

## 2020-03-22 ENCOUNTER — Encounter: Payer: Self-pay | Admitting: Internal Medicine

## 2020-03-22 ENCOUNTER — Ambulatory Visit (INDEPENDENT_AMBULATORY_CARE_PROVIDER_SITE_OTHER): Payer: Medicare Other | Admitting: Internal Medicine

## 2020-03-22 ENCOUNTER — Other Ambulatory Visit: Payer: Self-pay

## 2020-03-22 VITALS — BP 126/74 | HR 71 | Temp 98.1°F | Ht 62.99 in | Wt 172.2 lb

## 2020-03-22 DIAGNOSIS — I10 Essential (primary) hypertension: Secondary | ICD-10-CM

## 2020-03-22 DIAGNOSIS — E039 Hypothyroidism, unspecified: Secondary | ICD-10-CM | POA: Diagnosis not present

## 2020-03-22 DIAGNOSIS — R110 Nausea: Secondary | ICD-10-CM

## 2020-03-22 DIAGNOSIS — Z1231 Encounter for screening mammogram for malignant neoplasm of breast: Secondary | ICD-10-CM | POA: Diagnosis not present

## 2020-03-22 LAB — CBC WITH DIFFERENTIAL/PLATELET
Basophils Absolute: 0 10*3/uL (ref 0.0–0.1)
Basophils Relative: 0.4 % (ref 0.0–3.0)
Eosinophils Absolute: 0 10*3/uL (ref 0.0–0.7)
Eosinophils Relative: 0.7 % (ref 0.0–5.0)
HCT: 36.8 % (ref 36.0–46.0)
Hemoglobin: 11.9 g/dL — ABNORMAL LOW (ref 12.0–15.0)
Lymphocytes Relative: 32 % (ref 12.0–46.0)
Lymphs Abs: 1.8 10*3/uL (ref 0.7–4.0)
MCHC: 32.3 g/dL (ref 30.0–36.0)
MCV: 86.9 fl (ref 78.0–100.0)
Monocytes Absolute: 0.3 10*3/uL (ref 0.1–1.0)
Monocytes Relative: 5.2 % (ref 3.0–12.0)
Neutro Abs: 3.6 10*3/uL (ref 1.4–7.7)
Neutrophils Relative %: 61.7 % (ref 43.0–77.0)
Platelets: 189 10*3/uL (ref 150.0–400.0)
RBC: 4.23 Mil/uL (ref 3.87–5.11)
RDW: 14.9 % (ref 11.5–15.5)
WBC: 5.8 10*3/uL (ref 4.0–10.5)

## 2020-03-22 LAB — COMPREHENSIVE METABOLIC PANEL
ALT: 12 U/L (ref 0–35)
AST: 16 U/L (ref 0–37)
Albumin: 4.2 g/dL (ref 3.5–5.2)
Alkaline Phosphatase: 76 U/L (ref 39–117)
BUN: 15 mg/dL (ref 6–23)
CO2: 27 mEq/L (ref 19–32)
Calcium: 9.6 mg/dL (ref 8.4–10.5)
Chloride: 104 mEq/L (ref 96–112)
Creatinine, Ser: 0.9 mg/dL (ref 0.40–1.20)
GFR: 62.96 mL/min (ref >60.00)
Glucose, Bld: 101 mg/dL — ABNORMAL HIGH (ref 70–99)
Potassium: 3.6 mEq/L (ref 3.5–5.1)
Sodium: 139 mEq/L (ref 135–145)
Total Bilirubin: 0.3 mg/dL (ref 0.2–1.2)
Total Protein: 7.4 g/dL (ref 6.0–8.3)

## 2020-03-22 LAB — LIPID PANEL
Cholesterol: 166 mg/dL (ref 0–200)
HDL: 75.5 mg/dL (ref >39.00)
LDL Cholesterol: 76 mg/dL (ref 0–99)
NonHDL: 90.23
Total CHOL/HDL Ratio: 2
Triglycerides: 70 mg/dL (ref 0.0–149.0)
VLDL: 14 mg/dL (ref 0.0–40.0)

## 2020-03-22 LAB — TSH: TSH: 0.41 u[IU]/mL (ref 0.35–4.50)

## 2020-03-22 MED ORDER — LEVOTHYROXINE SODIUM 112 MCG PO TABS: 112.0000 ug | ORAL_TABLET | Freq: Every day | ORAL | 3 refills | Status: DC

## 2020-03-22 MED ORDER — ONDANSETRON HCL 4 MG PO TABS: ORAL_TABLET | ORAL | 1 refills | Status: DC

## 2020-03-22 NOTE — Progress Notes (Signed)
Chief Complaint  Patient presents with  . Follow-up   F/u  1. Hypothyroidism on levo 112 mcg qd will check labs today 2. HTN controlled on hctz 12.5 mg qd  3. 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  HENT: Negative for hearing loss.   Eyes: Negative for blurred vision.  Respiratory: Negative for shortness of breath.   Gastrointestinal: Negative for abdominal pain and nausea.  Musculoskeletal: Negative for falls and joint pain.  Skin: Negative for rash.  Neurological: Negative for headaches.  Psychiatric/Behavioral: Negative for depression.   Past Medical History:  Diagnosis Date  . Anemia due to antineoplastic chemotherapy 11/25/2018  . Cancer ([place])   . GERD (gastroesophageal reflux disease)   . Hypertension   . Hypomagnesemia 11/25/2018  . Hypothyroidism   . Malignant neoplasm of head of pancreas ([place]) 06/08/2018  . Personal history of chemotherapy 2019    Pancreatic cancer   Past Surgical History:  Procedure Laterality Date  . CHOLECYSTECTOMY    . COLONOSCOPY N/A 12/02/2016   Procedure: COLONOSCOPY;  Surgeon: Lollie Sails, MD;  Location: [facility] ENDOSCOPY;  Service: Endoscopy;  Laterality: N/A;  . ERCP N/A 04/30/2018   Procedure: ENDOSCOPIC RETROGRADE CHOLANGIOPANCREATOGRAPHY (ERCP);  Surgeon: Lucilla Lame, MD;  Location: [facility] ENDOSCOPY;  Service: Endoscopy;  Laterality: N/A;  . ERCP N/A 05/05/2018   Procedure: ENDOSCOPIC RETROGRADE CHOLANGIOPANCREATOGRAPHY (ERCP);  Surgeon: Lucilla Lame, MD;  Location: [facility] ENDOSCOPY;  Service: Endoscopy;  Laterality: N/A;  . PORTA CATH INSERTION N/A 06/28/2018   Procedure: PORTA CATH INSERTION;  Surgeon: Algernon Huxley, MD;  Location: [place] CV LAB;  Service: Cardiovascular;  Laterality: N/A;   Family History  Adopted:  Yes  Problem Relation Age of Onset  . Diabetes Mellitus I Other   . Alcoholism Other   . Hypertension Other   . Hyperlipidemia Other   . Coronary artery disease Other   . Stroke Other   . Osteoarthritis Other   . Migraines Other   . Heart Problems Mother   . Stroke Sister   . CAD Brother   . Stroke Brother   . Breast cancer Neg Hx    Social History   Socioeconomic History  . Marital status: Married    Spouse name: Not on file  . Number of children: Not on file  . Years of education: Not on file  . Highest education level: Not on file  Occupational History  . Not on file  Tobacco Use  . Smoking status: Former Smoker    Quit date: 04/14/1988    Years since quitting: 31.9  . Smokeless tobacco: Never Used  Vaping Use  . Vaping Use: Never used  Substance and Sexual Activity  . Alcohol use: Yes    Comment: social drinker  . Drug use: No  . Sexual activity: Not Currently  Other Topics Concern  . Not on file  Social History Narrative   Lives at home married    In order of [place] star   Social Determinants of Health   Financial Resource Strain: Low Risk   . Difficulty of Paying Living Expenses: Not hard at all  Food Insecurity: No Food Insecurity  . Worried About Charity fundraiser in the Last Year: Never true  . Ran Out of Food in  the Last Year: Never true  Transportation Needs: No Transportation Needs  . Lack of Transportation (Medical): No  . Lack of Transportation (Non-Medical): No  Physical Activity:   . Days of Exercise per Week: Not on file  . Minutes of Exercise per Session: Not on file  Stress: No Stress Concern Present  . Feeling of Stress : Not at all  Social Connections: Unknown  . Frequency of Communication with Friends and Family: Not on file  . Frequency of Social Gatherings with Friends and Family: Not on file  . Attends Religious Services: Not on file  . Active Member of Clubs or Organizations: Not on file  . Attends Archivist  Meetings: Not on file  . Marital Status: Married  Human resources officer Violence: Not At Risk  . Fear of Current or Ex-Partner: No  . Emotionally Abused: No  . Physically Abused: No  . Sexually Abused: No   Current Meds  Medication Sig  . acetaminophen-codeine (TYLENOL #3) 300-30 MG tablet Take 1 tablet by mouth every 4 (four) hours as needed for moderate pain.  Marland Kitchen ascorbic acid (VITAMIN C) 1000 MG tablet Take by mouth.  Marland Kitchen aspirin (ASPIRIN 81) 81 MG EC tablet Take 2 tablets (162 mg total) by mouth daily. Swallow whole.  . Cholecalciferol (VITAMIN D-3) 25 MCG (1000 UT) CAPS Take by mouth.  . CREON 36000-114000 units CPEP capsule TAKE 1 CAPSULE BY MOUTH THREE TIMES DAILY WITH MEALS (Patient taking differently: Take 36,000 Units by mouth every other day. )  . diclofenac Sodium (VOLTAREN) 1 % GEL Apply topically 4 (four) times daily.  . fluticasone (FLONASE) 50 MCG/ACT nasal spray Place 2 sprays into both nostrils daily. After nasal saline  . gabapentin (NEURONTIN) 400 MG capsule Take 1 capsule (400 mg total) by mouth 3 (three) times daily.  . hydrochlorothiazide (HYDRODIURIL) 12.5 MG tablet Take 1 tablet (12.5 mg total) by mouth daily. In am  . hydrocortisone 2.5 % ointment Apply topically 2 (two) times daily. Prn  . levothyroxine (SYNTHROID) 112 MCG tablet Take 1 tablet (112 mcg total) by mouth daily before breakfast.  . loperamide (IMODIUM) 2 MG capsule Take 1 capsule (2 mg total) by mouth See admin instructions. With onset of loose stool, take 4mg  followed by 2mg  every 2 hours until 12 hours have passed without loose bowel movement. Maximum: 16 mg/day  . Magnesium Cl-Calcium Carbonate (SLOW-MAG PO) Take by mouth. 2 QD  . Multiple Vitamins-Minerals (CENTRUM SILVER 50+WOMEN PO) Take by mouth.  . ondansetron (ZOFRAN) 4 MG tablet TAKE 1 TABLET BY MOUTH  EVERY 6 HOURS AS NEEDED FOR NAUSEA OR VOMITING.  . potassium chloride (KLOR-CON) 10 MEQ tablet Take 1 tablet (10 mEq total) by mouth daily.  .  Simethicone 125 MG CAPS Take by mouth.  . sodium chloride (OCEAN) 0.65 % SOLN nasal spray Place 2 sprays into both nostrils daily as needed for congestion.  . [DISCONTINUED] azithromycin (ZITHROMAX) 250 MG tablet 2 pills day 1 and 1 pill day 2-5 with food  . [DISCONTINUED] Dextromethorphan-guaiFENesin (MUCINEX DM MAXIMUM STRENGTH) 60-1200 MG TB12 Take 1 tablet by mouth 2 (two) times daily as needed. GREEN LABEL  . [DISCONTINUED] doxycycline (VIBRA-TABS) 100 MG tablet Take 1 tablet (100 mg total) by mouth 2 (two) times daily. With food  . [DISCONTINUED] levothyroxine (SYNTHROID) 112 MCG tablet Take 1 tablet (112 mcg total) by mouth daily before breakfast.  . [DISCONTINUED] mupirocin ointment (BACTROBAN) 2 % Apply 1 application topically 2 (two) times daily. X 5-7 days  . [  DISCONTINUED] ondansetron (ZOFRAN) 4 MG tablet TAKE 1 TABLET BY MOUTH  EVERY 6 HOURS AS NEEDED FOR NAUSEA OR VOMITING.  . [DISCONTINUED] prochlorperazine (COMPAZINE) 10 MG tablet TAKE 1 TABLET BY MOUTH  EVERY 6 HOURS AS NEEDED   No Known Allergies No results found for this or any previous visit (from the past 2160 hour(s)). Objective  Body mass index is 30.51 kg/m. Wt Readings from Last 3 Encounters:  03/22/20 172 lb 3.2 oz (78.1 kg)  02/03/20 171 lb (77.6 kg)  01/26/20 171 lb 6.4 oz (77.7 kg)   Temp Readings from Last 3 Encounters:  03/22/20 98.1 F (36.7 C) (Oral)  01/26/20 98.3 F (36.8 C)  11/02/19 98.3 F (36.8 C)   BP Readings from Last 3 Encounters:  03/22/20 126/74  01/26/20 (!) 152/72  11/02/19 128/73   Pulse Readings from Last 3 Encounters:  03/22/20 71  01/26/20 83  11/02/19 63    Physical Exam Vitals and nursing note reviewed.  Constitutional:      Appearance: Normal appearance. She is well-developed and well-groomed. She is obese.  HENT:     Head: Normocephalic and atraumatic.  Eyes:     Conjunctiva/sclera: Conjunctivae normal.     Pupils: Pupils are equal, round, and reactive to light.   Cardiovascular:     Rate and Rhythm: Normal rate and regular rhythm.     Heart sounds: Normal heart sounds. No murmur heard.   Pulmonary:     Effort: Pulmonary effort is normal.     Breath sounds: Normal breath sounds.  Abdominal:     General: Abdomen is flat. Bowel sounds are normal.     Tenderness: There is no abdominal tenderness.  Skin:    General: Skin is warm and dry.  Neurological:     General: No focal deficit present.     Mental Status: She is alert and oriented to person, place, and time. Mental status is at baseline.     Gait: Gait normal.  Psychiatric:        Attention and Perception: Attention and perception normal.        Mood and Affect: Mood and affect normal.        Speech: Speech normal.        Behavior: Behavior normal. Behavior is cooperative.        Thought Content: Thought content normal.        Cognition and Memory: Cognition and memory normal.        Judgment: Judgment normal.     Assessment  Plan  Primary hypertension - Plan: Comprehensive metabolic panel, Lipid panel, CBC with Differential/Platelet Cont hctz 12.5 mg qd   Hypothyroidism, unspecified type - Plan: levothyroxine (SYNTHROID) 112 MCG tablet, TSH  Nausea - Plan: ondansetron (ZOFRAN) 4 MG tablet   HM Flu shot declines Prevnar, pna 23 declines  Tdap, shingrix declines  [employer] 2/2 3rd dose had   mammo referred duesch 2/16/21negative ordered  Colonoscopy 12/02/16 sessile polyp and tubular adenoma [facility]  Check with GI to see when due No pap due dexa 07/27/15 normal rec centrum silver or nature made mvt with iron  Provider: Dr. Olivia Mackie McLean-Scocuzza-Internal Medicine

## 2020-03-26 NOTE — Progress Notes (Signed)
Looks like I will see her on 11/1 and I will evaluate the blistering rash at that time.  Thanks.

## 2020-04-02 DIAGNOSIS — Z08 Encounter for follow-up examination after completed treatment for malignant neoplasm: Secondary | ICD-10-CM | POA: Diagnosis not present

## 2020-04-02 DIAGNOSIS — Z9049 Acquired absence of other specified parts of digestive tract: Secondary | ICD-10-CM | POA: Diagnosis not present

## 2020-04-02 DIAGNOSIS — K769 Liver disease, unspecified: Secondary | ICD-10-CM | POA: Diagnosis not present

## 2020-04-02 DIAGNOSIS — R918 Other nonspecific abnormal finding of lung field: Secondary | ICD-10-CM | POA: Diagnosis not present

## 2020-04-02 DIAGNOSIS — N200 Calculus of kidney: Secondary | ICD-10-CM | POA: Diagnosis not present

## 2020-04-02 DIAGNOSIS — Z90411 Acquired partial absence of pancreas: Secondary | ICD-10-CM | POA: Diagnosis not present

## 2020-04-02 DIAGNOSIS — C250 Malignant neoplasm of head of pancreas: Secondary | ICD-10-CM | POA: Diagnosis not present

## 2020-04-02 DIAGNOSIS — K8689 Other specified diseases of pancreas: Secondary | ICD-10-CM | POA: Diagnosis not present

## 2020-04-02 DIAGNOSIS — Z8507 Personal history of malignant neoplasm of pancreas: Secondary | ICD-10-CM | POA: Diagnosis not present

## 2020-04-02 DIAGNOSIS — J432 Centrilobular emphysema: Secondary | ICD-10-CM | POA: Diagnosis not present

## 2020-04-09 ENCOUNTER — Other Ambulatory Visit: Payer: Self-pay

## 2020-04-09 ENCOUNTER — Ambulatory Visit: Payer: Medicare Other | Admitting: Dermatology

## 2020-04-09 ENCOUNTER — Encounter: Payer: Self-pay | Admitting: Internal Medicine

## 2020-04-09 DIAGNOSIS — D485 Neoplasm of uncertain behavior of skin: Secondary | ICD-10-CM | POA: Diagnosis not present

## 2020-04-09 DIAGNOSIS — L720 Epidermal cyst: Secondary | ICD-10-CM | POA: Diagnosis not present

## 2020-04-09 NOTE — Progress Notes (Signed)
   Follow-Up Visit   Subjective  Lynn Taylor is a 74 y.o. female who presents for the following: Cyst (right zygoma, surgery today).   The following portions of the chart were reviewed this encounter and updated as appropriate:      Review of Systems:  No other skin or systemic complaints except as noted in HPI or Assessment and Plan.  Objective  Well appearing patient in no apparent distress; mood and affect are within normal limits.  A focused examination was performed including face. Relevant physical exam findings are noted in the Assessment and Plan.  Objective  Right Zygoma: 0.7 x 0.6cm firm subcutaneous nodule.   Assessment & Plan  Neoplasm of uncertain behavior of skin Right Zygoma  Skin excision  Lesion length (cm):  0.7 Lesion width (cm):  0.6 Margin per side (cm):  0 Total excision diameter (cm):  0.7 Informed consent: discussed and consent obtained   Timeout: patient name, date of birth, surgical site, and procedure verified   Procedure prep:  Patient was prepped and draped in usual sterile fashion Prep type:  Povidone-iodine Anesthesia: the lesion was anesthetized in a standard fashion   Anesthetic:  1% lidocaine w/ epinephrine 1-100,000 buffered w/ 8.4% NaHCO3 (2cc) Instrument used comment:  #15c Hemostasis achieved with: pressure   Outcome: patient tolerated procedure well with no complications    Skin repair Complexity:  Intermediate Final length (cm):  1.1 Reason for type of repair: reduce subcutaneous dead space and avoid a hematoma and preserve normal anatomical and functional relationships   Undermining: edges could be approximated without difficulty and edges undermined   Subcutaneous layers (deep stitches):  Suture size:  5-0 Suture type: Vicryl (polyglactin 910)   Stitches:  Buried vertical mattress Fine/surface layer approximation (top stitches):  Suture size:  5-0 Suture type: nylon   Stitches: simple interrupted   Suture removal  (days):  7 Hemostasis achieved with: suture Outcome: patient tolerated procedure well with no complications   Post-procedure details: sterile dressing applied and wound care instructions given   Dressing type: pressure dressing (mupirocin)    Specimen 1 - Surgical pathology Differential Diagnosis: Cyst vs other Check Margins: Yes Firm subcutaneous nodule.  Return as scheduled 11/8 for SR and cyst surgery of the right lower sternum.   Documentation: I have reviewed the above documentation for accuracy and completeness, and I agree with the above.  Brendolyn Patty MD

## 2020-04-09 NOTE — Patient Instructions (Signed)

## 2020-04-10 ENCOUNTER — Other Ambulatory Visit: Payer: Self-pay | Admitting: Internal Medicine

## 2020-04-10 DIAGNOSIS — E876 Hypokalemia: Secondary | ICD-10-CM

## 2020-04-10 MED ORDER — POTASSIUM CHLORIDE CRYS ER 10 MEQ PO TBCR: 20.0000 meq | EXTENDED_RELEASE_TABLET | Freq: Two times a day (BID) | ORAL | 3 refills | Status: DC

## 2020-04-16 ENCOUNTER — Ambulatory Visit: Payer: Medicare Other | Admitting: Dermatology

## 2020-04-16 ENCOUNTER — Other Ambulatory Visit: Payer: Self-pay

## 2020-04-16 ENCOUNTER — Telehealth: Payer: Self-pay | Admitting: Internal Medicine

## 2020-04-16 DIAGNOSIS — L720 Epidermal cyst: Secondary | ICD-10-CM | POA: Diagnosis not present

## 2020-04-16 DIAGNOSIS — Z4802 Encounter for removal of sutures: Secondary | ICD-10-CM

## 2020-04-16 DIAGNOSIS — D485 Neoplasm of uncertain behavior of skin: Secondary | ICD-10-CM | POA: Diagnosis not present

## 2020-04-16 NOTE — Progress Notes (Signed)
   Follow-Up Visit   Subjective  Lynn Taylor is a 74 y.o. female who presents for the following: Cyst (right lower sternum, surgery today) and Cyst post op (right zygoma).  Healing well no problems   The following portions of the chart were reviewed this encounter and updated as appropriate:      Review of Systems:  No other skin or systemic complaints except as noted in HPI or Assessment and Plan.  Objective  Well appearing patient in no apparent distress; mood and affect are within normal limits.  A focused examination was performed including face, chest. Relevant physical exam findings are noted in the Assessment and Plan.  Objective  Right lower sternum: Firm sq nodule 1.2 x 0.9cm  Objective  Right Zygoma: Well-healed excision site.   Assessment & Plan  Neoplasm of uncertain behavior of skin Right lower sternum  Skin excision  Lesion length (cm):  1.2 Lesion width (cm):  0.9 Margin per side (cm):  0 Total excision diameter (cm):  1.2 Informed consent: discussed and consent obtained   Timeout: patient name, date of birth, surgical site, and procedure verified   Procedure prep:  Patient was prepped and draped in usual sterile fashion Prep type:  Povidone-iodine Anesthesia: the lesion was anesthetized in a standard fashion   Anesthetic:  1% lidocaine w/ epinephrine 1-100,000 buffered w/ 8.4% NaHCO3 (11cc) Instrument used comment:  #15c Hemostasis achieved with: pressure and electrodesiccation   Outcome: patient tolerated procedure well with no complications    Skin repair Complexity:  Intermediate Final length (cm):  1.8 Reason for type of repair: reduce tension to allow closure, reduce the risk of dehiscence, infection, and necrosis and reduce subcutaneous dead space and avoid a hematoma   Undermining: edges undermined   Subcutaneous layers (deep stitches):  Suture size:  4-0 Suture type: Vicryl (polyglactin 910)   Stitches:  Buried vertical  mattress Fine/surface layer approximation (top stitches):  Suture size:  4-0 Suture type: nylon   Stitches: simple interrupted   Suture removal (days):  7 Hemostasis achieved with: suture and electrodesiccation Outcome: patient tolerated procedure well with no complications   Post-procedure details: sterile dressing applied and wound care instructions given   Dressing type: pressure dressing (mupirocin)    Specimen 1 - Surgical pathology Differential Diagnosis: Cyst vs other Check Margins: Yes Firm sq nodule 1.2 x 0.9cm  Epidermal inclusion cyst Right Zygoma  Wound cleansed, sutures removed, wound cleansed and steri strips applied. Discussed pathology results.   Return in about 1 week (around 04/23/2020) for suture removal.   I, Jamesetta Orleans, CMA, am acting as scribe for Brendolyn Patty, MD .  Documentation: I have reviewed the above documentation for accuracy and completeness, and I agree with the above.  Brendolyn Patty MD

## 2020-04-16 NOTE — Patient Instructions (Signed)

## 2020-04-16 NOTE — Telephone Encounter (Signed)
Faxed Medical Clarification request to [facility] FOR Klor-con M10 MEQ take 2 tablets by mouth twice daily faxed on 04-13-2020

## 2020-04-17 ENCOUNTER — Telehealth: Payer: Self-pay

## 2020-04-17 NOTE — Telephone Encounter (Signed)
Left message for patient to call office if having any problems from surgery yesterday. Also, advised that excision on the right zygoma was a benign cyst.

## 2020-04-23 ENCOUNTER — Ambulatory Visit (INDEPENDENT_AMBULATORY_CARE_PROVIDER_SITE_OTHER): Payer: Medicare Other | Admitting: Dermatology

## 2020-04-23 ENCOUNTER — Other Ambulatory Visit: Payer: Self-pay

## 2020-04-23 DIAGNOSIS — L720 Epidermal cyst: Secondary | ICD-10-CM

## 2020-04-23 DIAGNOSIS — Z4802 Encounter for removal of sutures: Secondary | ICD-10-CM

## 2020-04-23 NOTE — Progress Notes (Signed)
   Follow-Up Visit   Subjective  Lynn Taylor is a 74 y.o. female who presents for the following: Post op (cyst of the right lower sternum).   The following portions of the chart were reviewed this encounter and updated as appropriate:      Review of Systems:  No other skin or systemic complaints except as noted in HPI or Assessment and Plan.  Objective  Well appearing patient in no apparent distress; mood and affect are within normal limits.  A focused examination was performed including face, chest. Relevant physical exam findings are noted in the Assessment and Plan.  Objective  Right Lower Sternum: Excision site healing well, no evidence of infection    Assessment & Plan  Epidermal inclusion cyst Right Lower Sternum  Wound cleansed, sutures removed, wound cleansed and steri strips applied. Discussed pathology results.  Benign cyst  Return if symptoms worsen or fail to improve.   IJamesetta Orleans, CMA, am acting as scribe for Brendolyn Patty, MD .  Documentation: I have reviewed the above documentation for accuracy and completeness, and I agree with the above.  Brendolyn Patty MD

## 2020-04-27 ENCOUNTER — Inpatient Hospital Stay (HOSPITAL_BASED_OUTPATIENT_CLINIC_OR_DEPARTMENT_OTHER): Payer: Medicare Other | Admitting: Oncology

## 2020-04-27 ENCOUNTER — Other Ambulatory Visit: Payer: Self-pay

## 2020-04-27 ENCOUNTER — Inpatient Hospital Stay: Payer: Medicare Other | Attending: Oncology

## 2020-04-27 ENCOUNTER — Encounter: Payer: Self-pay | Admitting: Oncology

## 2020-04-27 VITALS — BP 124/72 | HR 69 | Temp 96.7°F | Resp 16 | Wt 169.3 lb

## 2020-04-27 DIAGNOSIS — G629 Polyneuropathy, unspecified: Secondary | ICD-10-CM | POA: Diagnosis not present

## 2020-04-27 DIAGNOSIS — T451X5A Adverse effect of antineoplastic and immunosuppressive drugs, initial encounter: Secondary | ICD-10-CM | POA: Insufficient documentation

## 2020-04-27 DIAGNOSIS — Z86718 Personal history of other venous thrombosis and embolism: Secondary | ICD-10-CM | POA: Diagnosis not present

## 2020-04-27 DIAGNOSIS — E039 Hypothyroidism, unspecified: Secondary | ICD-10-CM | POA: Diagnosis not present

## 2020-04-27 DIAGNOSIS — Z9221 Personal history of antineoplastic chemotherapy: Secondary | ICD-10-CM | POA: Diagnosis not present

## 2020-04-27 DIAGNOSIS — Z87891 Personal history of nicotine dependence: Secondary | ICD-10-CM | POA: Insufficient documentation

## 2020-04-27 DIAGNOSIS — Z9049 Acquired absence of other specified parts of digestive tract: Secondary | ICD-10-CM | POA: Diagnosis not present

## 2020-04-27 DIAGNOSIS — Z79899 Other long term (current) drug therapy: Secondary | ICD-10-CM | POA: Insufficient documentation

## 2020-04-27 DIAGNOSIS — R7989 Other specified abnormal findings of blood chemistry: Secondary | ICD-10-CM | POA: Insufficient documentation

## 2020-04-27 DIAGNOSIS — I10 Essential (primary) hypertension: Secondary | ICD-10-CM | POA: Diagnosis not present

## 2020-04-27 DIAGNOSIS — K219 Gastro-esophageal reflux disease without esophagitis: Secondary | ICD-10-CM | POA: Diagnosis not present

## 2020-04-27 DIAGNOSIS — G620 Drug-induced polyneuropathy: Secondary | ICD-10-CM | POA: Insufficient documentation

## 2020-04-27 DIAGNOSIS — Z7982 Long term (current) use of aspirin: Secondary | ICD-10-CM | POA: Insufficient documentation

## 2020-04-27 DIAGNOSIS — C772 Secondary and unspecified malignant neoplasm of intra-abdominal lymph nodes: Secondary | ICD-10-CM | POA: Insufficient documentation

## 2020-04-27 DIAGNOSIS — Z791 Long term (current) use of non-steroidal anti-inflammatories (NSAID): Secondary | ICD-10-CM | POA: Diagnosis not present

## 2020-04-27 DIAGNOSIS — C250 Malignant neoplasm of head of pancreas: Secondary | ICD-10-CM

## 2020-04-27 LAB — COMPREHENSIVE METABOLIC PANEL
ALT: 19 U/L (ref 0–44)
AST: 28 U/L (ref 15–41)
Albumin: 4.4 g/dL (ref 3.5–5.0)
Alkaline Phosphatase: 78 U/L (ref 38–126)
Anion gap: 9 (ref 5–15)
BUN: 13 mg/dL (ref 8–23)
CO2: 22 mmol/L (ref 22–32)
Calcium: 9.7 mg/dL (ref 8.9–10.3)
Chloride: 105 mmol/L (ref 98–111)
Creatinine, Ser: 1.17 mg/dL — ABNORMAL HIGH (ref 0.44–1.00)
GFR, Estimated: 49 mL/min — ABNORMAL LOW (ref >60)
Glucose, Bld: 141 mg/dL — ABNORMAL HIGH (ref 70–99)
Potassium: 4.5 mmol/L (ref 3.5–5.1)
Sodium: 136 mmol/L (ref 135–145)
Total Bilirubin: 0.6 mg/dL (ref 0.3–1.2)
Total Protein: 8.2 g/dL — ABNORMAL HIGH (ref 6.5–8.1)

## 2020-04-27 LAB — CBC WITH DIFFERENTIAL/PLATELET
Abs Immature Granulocytes: 0.02 10*3/uL (ref 0.00–0.07)
Basophils Absolute: 0 10*3/uL (ref 0.0–0.1)
Basophils Relative: 0 %
Eosinophils Absolute: 0.1 10*3/uL (ref 0.0–0.5)
Eosinophils Relative: 1 %
HCT: 38.1 % (ref 36.0–46.0)
Hemoglobin: 12.1 g/dL (ref 12.0–15.0)
Immature Granulocytes: 0 %
Lymphocytes Relative: 35 %
Lymphs Abs: 2.4 10*3/uL (ref 0.7–4.0)
MCH: 27.6 pg (ref 26.0–34.0)
MCHC: 31.8 g/dL (ref 30.0–36.0)
MCV: 86.8 fL (ref 80.0–100.0)
Monocytes Absolute: 0.3 10*3/uL (ref 0.1–1.0)
Monocytes Relative: 4 %
Neutro Abs: 4.1 10*3/uL (ref 1.7–7.7)
Neutrophils Relative %: 60 %
Platelets: 155 10*3/uL (ref 150–400)
RBC: 4.39 MIL/uL (ref 3.87–5.11)
RDW: 15.4 % (ref 11.5–15.5)
WBC: 6.9 10*3/uL (ref 4.0–10.5)
nRBC: 0 % (ref 0.0–0.2)

## 2020-04-27 MED ORDER — ASPIRIN 81 MG PO TBEC: 162.0000 mg | DELAYED_RELEASE_TABLET | Freq: Every day | ORAL | 1 refills | Status: AC

## 2020-04-27 NOTE — Progress Notes (Signed)
Hematology/Oncology Follow Up Note [facility]  [phone] [phone]  Patient Care Team: McLean-Scocuzza, Nino Glow, MD as PCP - General (Internal Medicine) Clent Jacks, RN as Registered Nurse Jonathon Bellows, MD as Consulting Physician (Gastroenterology) Clent Jacks, RN as Oncology Nurse Navigator Earlie Server, MD as Consulting Physician (Oncology) Fayrene Helper Stacie Acres, MD as Referring Physician (Oncology) Algernon Huxley, MD as Referring Physician (Vascular Surgery)   Name of the patient: Lynn Taylor  [id number]  [birth year]   REASON FOR VISIT  follow-up for adjuvant chemotherapy for pancreatic cancer.  HISTORY OF PRESENTING ILLNESS:  Lynn Taylor is a  74 y.o.  female with PMH listed below who was referred to me for evaluation of evaluation of elevated ferritin. Patient recently had lab work-up done on 04/09/2018 which showed ferritin level 1877, iron 97, TIBC 340, iron saturation 29, 04/11/2018 CBC showed hemoglobin 13.2, MCV 81, WBC 7.6, platelet counts 246,000. Patient was referred to hematology for further evaluation of elevated ferritin.  #04/28/2018 Ultrasound of liver was obtained which showed CBD and intrahepatic biliary dilatation Patient wa she is s advised to go to emergency room for further evaluation. Also had hyperbilirubinemia.  Patient was complaining about being jaundiced, pruritus all over. MR abdomen MRCP with and without contrast showed market biliary duct dilatation, secondary to obstruction in the region of pancreatic head.  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 No abdominal adenopathy, liver metastasis or cross vascular involvement.  patient was evaluated by gastroenterology and status post ERCP with stenting. CA 19.9 1173 CEA 3.9 # 05/25/2018  patient was referred to [facility] and s/p whipple resection.Her postop course was c/b Type A pancreatic leak and  c.diff. Pathology showed A. Hepatic artery lymph node, excision: One lymph node, negative for malignancy (0/1). B. Biliary stent, removal: Medical device, consistent with stent, gross examination only. C. 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 All margins are negative (closest margin=uncinate, 0.5 mm) Metastatic adenocarcinoma in one of thirty-two lymph nodes (1/32).  Portion of stomach and duodenum with no specific pathologic diagnosis.   Grade, 3 poorly differentiated.  Pathologic pT2 pN1  Cancer Treatment 07/13/2018 Cycle 1 FOLFIRINOX with Onpro Neulasta.  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 10/06/2018 cycle 6  mFOLFIRINOX with Onpro Neulasta - [50% dose for Irinotecan- 5-FU bolus omitted]. 10/20/2018 cycle 7 mFOLFIRINOX with Onpro Neulasta - [50% dose for Irinotecan- 5-FU bolus omitted].  INTERVAL HISTORY Lynn Taylor is a 74 y.o. female with history of stage IIb pancreatic cancer presents to for follow-up.   She has gained weight since last visit.  She follows up with [facility]  every 3 months and have her surveillance work up done at [facility]. Last seen there on 04/02/2020  She is doing well clinically.  04/02/2020 CT chest abdomen pelvis  Postsurgical changes from Whipple procedure. Minimally increased dilation  of the main pancreatic duct,  which is nonspecific. Overall stable  appearance of the pancreaticojejunostomy. Recommend  attention on follow-up.  Decreased, previously prominent right axillary lymph node.  No definite findings of new or increased metastatic disease in the chest,  abdomen, or pelvis.   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 She takes gabapentine   Review of Systems  Constitutional: Negative for appetite change, chills, fatigue, fever and unexpected weight change.  HENT:   Negative for hearing loss and voice change.   Eyes: Negative for eye problems.  Respiratory: Negative for chest tightness, cough and shortness of breath.   Cardiovascular: Negative for chest pain and leg swelling.  Gastrointestinal: Negative for abdominal distention, abdominal pain, blood in stool and diarrhea.  Endocrine: Negative for hot flashes.  Genitourinary: Negative for difficulty urinating and frequency.   Musculoskeletal: Negative for arthralgias.  Skin: Negative for itching and rash.  Neurological: Positive for numbness. Negative for extremity weakness.  Hematological: Negative for adenopathy.  Psychiatric/Behavioral: Negative for confusion.      No Known Allergies   Past Medical History:  Diagnosis Date  . Anemia due to antineoplastic chemotherapy 11/25/2018  . Cancer ([place])   . GERD (gastroesophageal reflux disease)   . Hypertension   . Hypomagnesemia 11/25/2018  . Hypothyroidism   . Malignant neoplasm of head of pancreas ([place]) 06/08/2018  . Personal history of chemotherapy 2019    Pancreatic cancer     Past Surgical History:  Procedure Laterality Date  . CHOLECYSTECTOMY    . COLONOSCOPY N/A 12/02/2016   Procedure: COLONOSCOPY;  Surgeon: Lollie Sails, MD;  Location: [facility] ENDOSCOPY;  Service: Endoscopy;  Laterality: N/A;  . ERCP N/A 04/30/2018   Procedure: ENDOSCOPIC RETROGRADE CHOLANGIOPANCREATOGRAPHY (ERCP);  Surgeon: Lucilla Lame, MD;  Location: [facility] ENDOSCOPY;  Service:  Endoscopy;  Laterality: N/A;  . ERCP N/A 05/05/2018   Procedure: ENDOSCOPIC RETROGRADE CHOLANGIOPANCREATOGRAPHY (ERCP);  Surgeon: Lucilla Lame, MD;  Location: [facility] ENDOSCOPY;  Service: Endoscopy;  Laterality: N/A;  . PORTA CATH INSERTION N/A 06/28/2018   Procedure: PORTA CATH INSERTION;  Surgeon: Algernon Huxley, MD;  Location: [place] [facility] CV LAB;  Service: Cardiovascular;  Laterality: N/A;    Social History   Socioeconomic History  . Marital status: Married    Spouse name: Not on file  . Number of children: Not on file  . Years of education: Not on file  . Highest education level: Not on file  Occupational History  . Not on file  Tobacco Use  . Smoking status: Former Smoker    Quit date: 04/14/1988    Years since quitting: 32.0  . Smokeless tobacco: Never Used  Vaping Use  . Vaping Use: Never used  Substance and Sexual Activity  . Alcohol use: Yes    Comment: social drinker  . Drug use: No  . Sexual activity: Not Currently  Other Topics Concern  . Not on file  Social History Narrative   Lives at home married    In order of [place] star   Social Determinants of Health   Financial Resource Strain: Low Risk   . Difficulty of Paying Living Expenses: Not hard at all  Food Insecurity: No Food Insecurity  . Worried About Charity fundraiser in the Last Year: Never true  . Ran Out of Food in the Last Year: Never true  Transportation Needs: No Transportation Needs  . Lack of Transportation (Medical): No  . Lack of Transportation (Non-Medical): No  Physical Activity:   . Days of Exercise per Week: Not on file  . Minutes of Exercise per Session: Not on  file  Stress: No Stress Concern Present  . Feeling of Stress : Not at all  Social Connections: Unknown  . Frequency of Communication with Friends and Family: Not on file  . Frequency of Social Gatherings with Friends and Family: Not on file  . Attends Religious Services: Not on file  . Active Member of Clubs or Organizations:  Not on file  . Attends Archivist Meetings: Not on file  . Marital Status: Married  Human resources officer Violence: Not At Risk  . Fear of Current or Ex-Partner: No  . Emotionally Abused: No  . Physically Abused: No  . Sexually Abused: No    Family History  Adopted: Yes  Problem Relation Age of Onset  . Diabetes Mellitus I Other   . Alcoholism Other   . Hypertension Other   . Hyperlipidemia Other   . Coronary artery disease Other   . Stroke Other   . Osteoarthritis Other   . Migraines Other   . Heart Problems Mother   . Stroke Sister   . CAD Brother   . Stroke Brother   . Breast cancer Neg Hx      Current Outpatient Medications:  .  aspirin (ASPIRIN 81) 81 MG EC tablet, Take 2 tablets (162 mg total) by mouth daily. Swallow whole., Disp: 180 tablet, Rfl: 1 .  Cholecalciferol (VITAMIN D-3 PO), Take 50 mcg by mouth daily. , Disp: , Rfl:  .  diclofenac Sodium (VOLTAREN) 1 % GEL, Apply topically 4 (four) times daily., Disp: , Rfl:  .  fluticasone (FLONASE) 50 MCG/ACT nasal spray, Place 2 sprays into both nostrils daily. After nasal saline, Disp: 16 g, Rfl: 2 .  gabapentin (NEURONTIN) 400 MG capsule, Take 1 capsule (400 mg total) by mouth 3 (three) times daily., Disp: 270 capsule, Rfl: 3 .  hydrochlorothiazide (HYDRODIURIL) 12.5 MG tablet, Take 1 tablet (12.5 mg total) by mouth daily. In am, Disp: 90 tablet, Rfl: 3 .  hydrocortisone 2.5 % ointment, Apply topically 2 (two) times daily. Prn, Disp: 30 g, Rfl: 0 .  levothyroxine (SYNTHROID) 112 MCG tablet, Take 1 tablet (112 mcg total) by mouth daily before breakfast. (Patient taking differently: Take 100 mcg by mouth daily before breakfast. ), Disp: 90 tablet, Rfl: 3 .  Magnesium Cl-Calcium Carbonate (SLOW-MAG PO), Take by mouth. 2 QD, Disp: , Rfl:  .  Multiple Vitamins-Minerals (CENTRUM SILVER 50+WOMEN PO), Take by mouth., Disp: , Rfl:  .  ondansetron (ZOFRAN) 4 MG tablet, TAKE 1 TABLET BY MOUTH  EVERY 6 HOURS AS NEEDED FOR  NAUSEA OR VOMITING., Disp: 180 tablet, Rfl: 1 .  potassium chloride (KLOR-CON) 10 MEQ tablet, Take 2 tablets (20 mEq total) by mouth 2 (two) times daily., Disp: 270 tablet, Rfl: 3 .  Simethicone 125 MG CAPS, Take by mouth., Disp: , Rfl:  .  sodium chloride (OCEAN) 0.65 % SOLN nasal spray, Place 2 sprays into both nostrils daily as needed for congestion., Disp: 30 mL, Rfl: 2 .  TURMERIC CURCUMIN PO, Take by mouth 3 (three) times daily with meals., Disp: , Rfl:  .  acetaminophen-codeine (TYLENOL #3) 300-30 MG tablet, Take 1 tablet by mouth every 4 (four) hours as needed for moderate pain. (Patient not taking: Reported on 04/27/2020), Disp: 20 tablet, Rfl: 0 .  ascorbic acid (VITAMIN C) 1000 MG tablet, Take by mouth. (Patient not taking: Reported on 04/27/2020), Disp: , Rfl:  .  CREON 36000-114000 units CPEP capsule, TAKE 1 CAPSULE BY MOUTH THREE TIMES DAILY WITH MEALS (  Patient not taking: Reported on 04/27/2020), Disp: 90 capsule, Rfl: 0 .  loperamide (IMODIUM) 2 MG capsule, Take 1 capsule (2 mg total) by mouth See admin instructions. With onset of loose stool, take 4mg  followed by 2mg  every 2 hours until 12 hours have passed without loose bowel movement. Maximum: 16 mg/day (Patient not taking: Reported on 04/27/2020), Disp: 120 capsule, Rfl: 1  Physical exam: ECOG 2 Vitals:   04/27/20 1342  BP: 124/72  Pulse: 69  Resp: 16  Temp: (!) 96.7 F (35.9 C)  Weight: 169 lb 4.8 oz (76.8 kg)   Physical Exam Constitutional:      General: She is not in acute distress. HENT:     Head: Normocephalic and atraumatic.  Eyes:     General: No scleral icterus.    Pupils: Pupils are equal, round, and reactive to light.  Cardiovascular:     Rate and Rhythm: Normal rate and regular rhythm.     Heart sounds: Normal heart sounds.  Pulmonary:     Effort: Pulmonary effort is normal. No respiratory distress.     Breath sounds: No wheezing.  Abdominal:     General: Bowel sounds are normal. There is no  distension.     Palpations: Abdomen is soft. There is no mass.     Tenderness: There is no abdominal tenderness.  Musculoskeletal:        General: No swelling or deformity. Normal range of motion.     Cervical back: Normal range of motion and neck supple.  Skin:    General: Skin is warm and dry.     Findings: No erythema or rash.  Neurological:     Mental Status: She is alert and oriented to person, place, and time. Mental status is at baseline.     Cranial Nerves: No cranial nerve deficit.     Coordination: Coordination normal.  Psychiatric:        Mood and Affect: Mood normal.     CMP Latest Ref Rng & Units 03/22/2020  Glucose 70 - 99 mg/dL 101(H)  BUN 6 - 23 mg/dL 15  Creatinine 0.40 - 1.20 mg/dL 0.90  Sodium 135 - 145 mEq/L 139  Potassium 3.5 - 5.1 mEq/L 3.6  Chloride 96 - 112 mEq/L 104  CO2 19 - 32 mEq/L 27  Calcium 8.4 - 10.5 mg/dL 9.6  Total Protein 6.0 - 8.3 g/dL 7.4  Total Bilirubin 0.2 - 1.2 mg/dL 0.3  Alkaline Phos 39 - 117 U/L 76  AST 0 - 37 U/L 16  ALT 0 - 35 U/L 12   CBC Latest Ref Rng & Units 04/27/2020  WBC 4.0 - 10.5 K/uL 6.9  Hemoglobin 12.0 - 15.0 g/dL 12.1  Hematocrit 36 - 46 % 38.1  Platelets 150 - 400 K/uL 155   RADIOGRAPHIC STUDIES: I have personally reviewed the radiological images as listed and agreed with the findings in the report.  No results found.   Assessment and plan Patient is a 74 y.o. female history of stage IIb pancreatic cancer present for discussion of image results and management of newly diagnosed bilateral lower extremity DVT. 1. History of deep vein thrombosis (DVT) of lower extremity   2. Malignant neoplasm of head of pancreas ([place])   3. Neuropathy    #Stage IIB pancreatic cancer status post surgical resection. Previously received 7 out of the 12 recommended cycles of mFOLFIRINOX with  Difficulties and have decided not to proceed with additional adjuvant chemotherapy. Recent CT scan was done at [facility].  I reviewed the  results. No recurrence.   CA19.9 level is pending at the time of dictation.  She follows up closed with [facility] and have lab and images at interval of every 3 months.   Follow up with me in 6 months.   # anemia has resolved.   #History of bilateral lower extremity DVT, no definitve provoking events. Possible due to immobility during her chemotherapy treatments. Repeat US lower extremity showed near completion of DVT with minimal mild chronic DVT.  Continue Aspirin 162mg  daily as prophylaxis.   #Chemotherapy-induced neuropathy, continue gabapentin 400 mg 3 times daily.refer to acupuncture clinic   Follow-up pain in 6 months.Earlie Server, MD, PhD Hematology Oncology [place] [facility] 04/27/2020

## 2020-04-27 NOTE — Progress Notes (Signed)
Patient denies new problems/concerns today.   °

## 2020-04-28 LAB — CANCER ANTIGEN 19-9: CA 19-9: 17 U/mL (ref 0–35)

## 2020-04-30 ENCOUNTER — Telehealth: Payer: Self-pay

## 2020-04-30 ENCOUNTER — Other Ambulatory Visit: Payer: Medicare Other

## 2020-04-30 ENCOUNTER — Ambulatory Visit: Payer: Medicare Other | Admitting: Oncology

## 2020-04-30 NOTE — Telephone Encounter (Signed)
Patient last port flush was at [facility] on 04/02/20.  Please schedule her for port flushes every 8 weeks starting end of 05/2020 till the next MD visit.  I will inform her of appts when I call her with results.

## 2020-04-30 NOTE — Telephone Encounter (Signed)
Patient is aware of results.  Please call her with the port flush appt detials.

## 2020-04-30 NOTE — Telephone Encounter (Signed)
-----   Message from Earlie Server, MD sent at 04/27/2020 10:57 PM EST ----- Cr is slightly increased. Please advise her to increase oral hydration.  Also does she still have medi port, if yes, please continue port flush q6-8 weeks x 6 thanks.

## 2020-04-30 NOTE — Telephone Encounter (Signed)
Done

## 2020-05-10 ENCOUNTER — Encounter: Payer: Self-pay | Admitting: Internal Medicine

## 2020-05-18 ENCOUNTER — Ambulatory Visit (INDEPENDENT_AMBULATORY_CARE_PROVIDER_SITE_OTHER): Payer: Medicare Other

## 2020-05-18 ENCOUNTER — Other Ambulatory Visit: Payer: Self-pay

## 2020-05-18 DIAGNOSIS — Z23 Encounter for immunization: Secondary | ICD-10-CM | POA: Diagnosis not present

## 2020-05-21 IMAGING — MR MR CERVICAL SPINE W/O CM
5 series · 39 of 48 positions shown · non-contrast
Comparison: None.

CLINICAL DATA: Bilateral upper and lower extremity neuropathy for 5
months. Bilateral hand weakness. No known injury.

EXAM:
MRI CERVICAL SPINE WITHOUT CONTRAST
TECHNIQUE: Multiplanar, multisequence MR imaging of the cervical spine was
performed. No intravenous contrast was administered.

[Series 5: T2 · sagittal · 3.0mm · 0.62mm/px · 6 of 15 slices shown (1 of 2)]
[im 1/15]
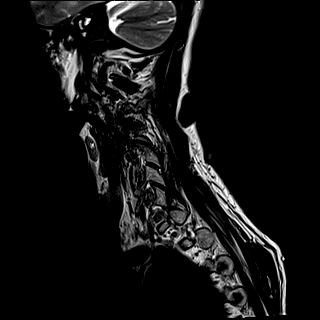
[im 3/15]
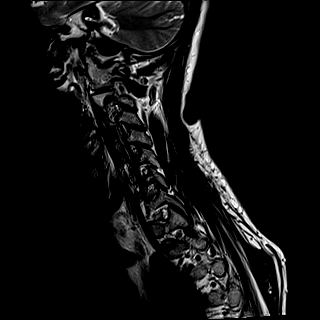
[im 6/15]
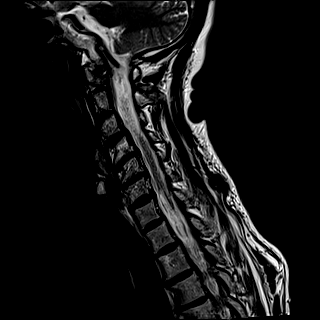
[im 9/15]
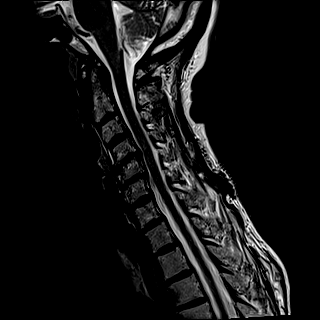
[im 12/15]
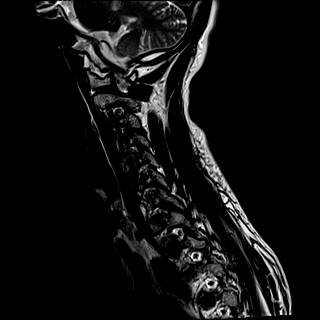
[im 15/15]
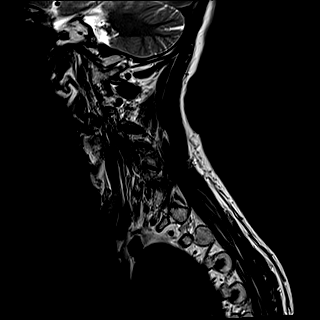

[Series 6: FLAIR · sagittal · 3.0mm · 0.78mm/px · 7 of 15 slices shown]
[im 1/15]
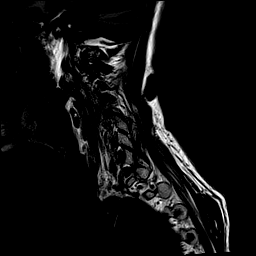
[im 3/15]
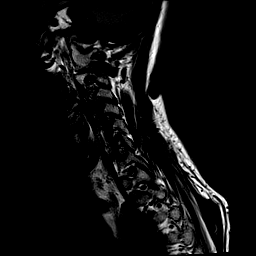
[im 5/15]
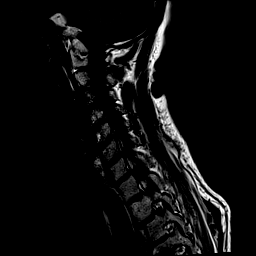
[im 8/15]
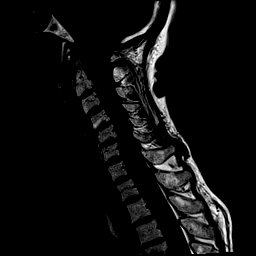
[im 10/15]
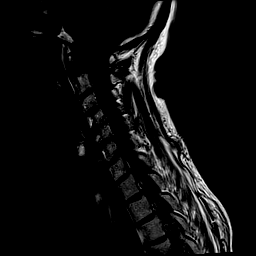
[im 12/15]
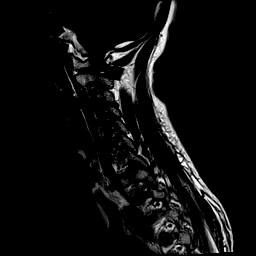
[im 15/15]
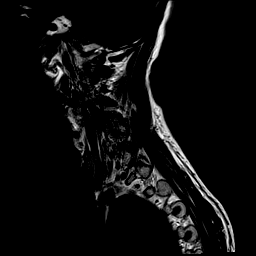

[Series 7: STIR · sagittal · 3.0mm · 0.62mm/px · 7 of 15 slices shown]
[im 1/15]
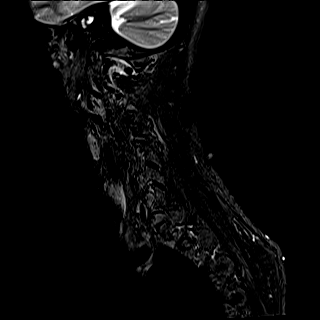
[im 3/15]
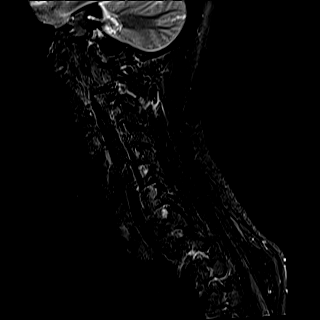
[im 5/15]
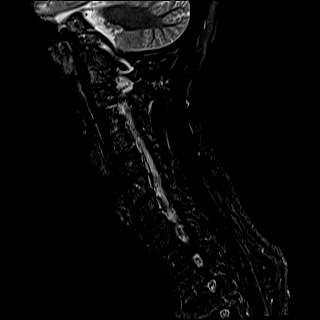
[im 8/15]
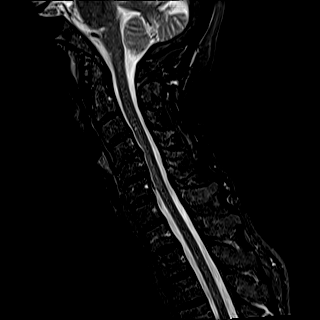
[im 10/15]
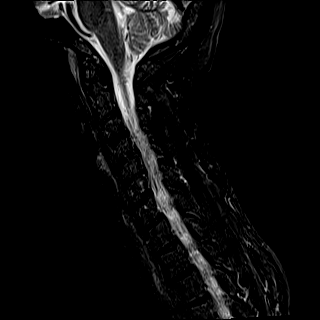
[im 12/15]
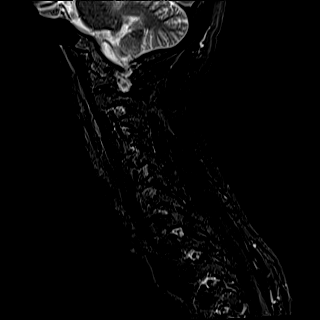
[im 15/15]
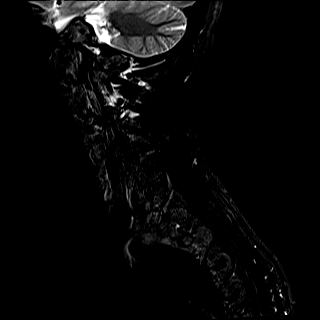

[Series 8: T2 · axial · 3.0mm · 0.70mm/px · z∈[-47,+47]mm · 11 of 29 slices shown (2 of 2)]
[im 1/29]
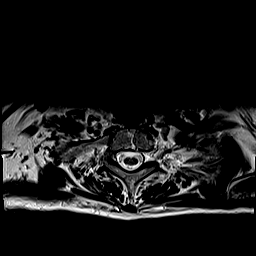
[im 3/29]
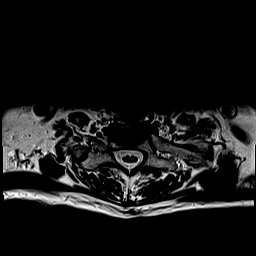
[im 5/29]
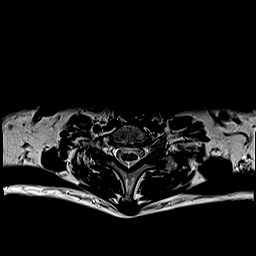
[im 7/29]
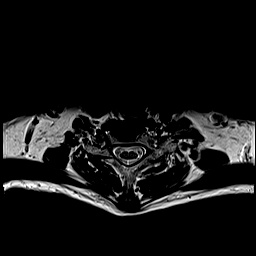
[im 9/29]
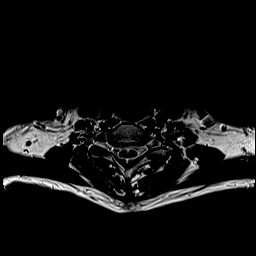
[im 11/29]
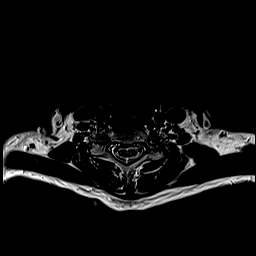
[im 13/29]
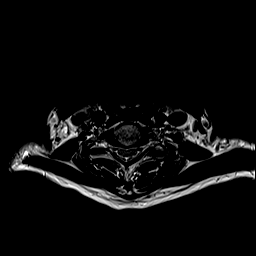
[im 16/29]
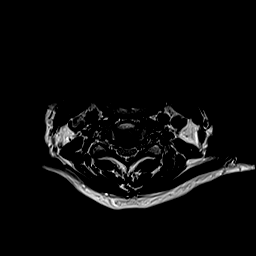
[im 20/29]
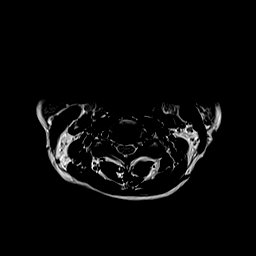
[im 24/29]
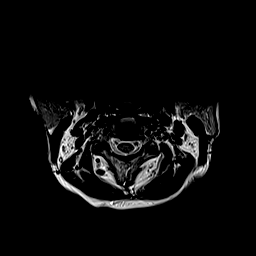
[im 29/29]
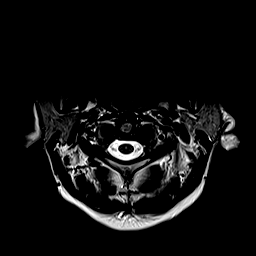

[Series 9: ax mpgr · axial · 3.0mm · 0.35mm/px · z∈[-47,+47]mm · 8 of 29 slices shown]
[im 1/29]
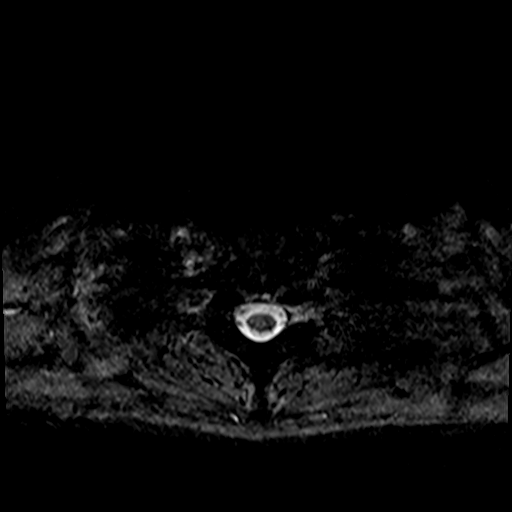
[im 5/29]
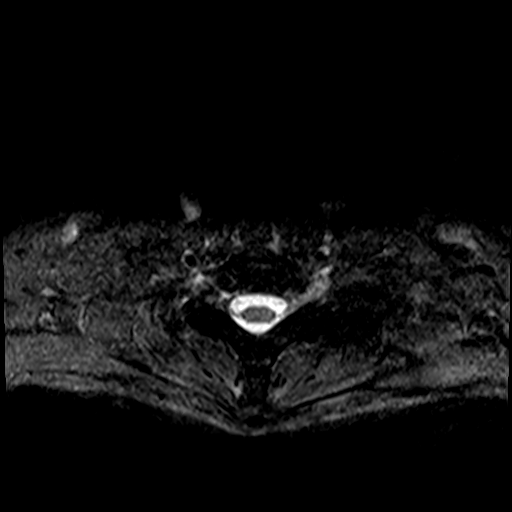
[im 9/29]
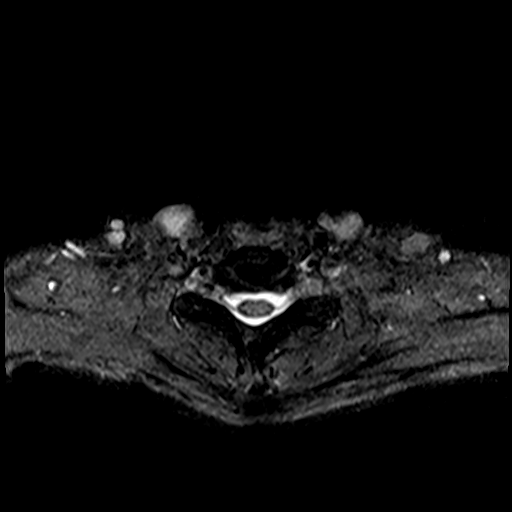
[im 13/29]
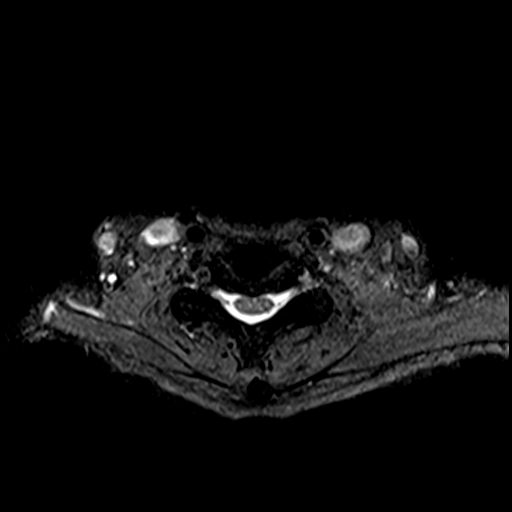
[im 16/29]
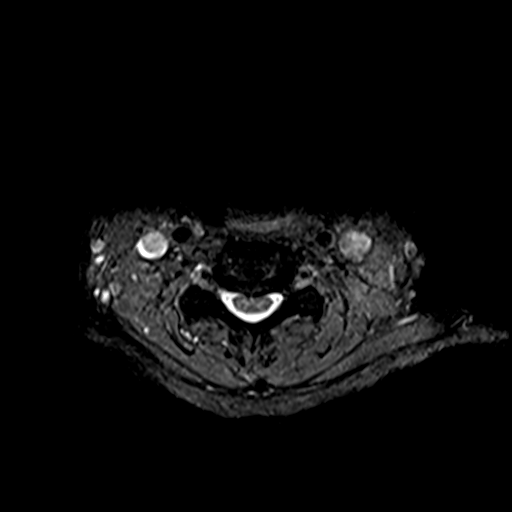
[im 20/29]
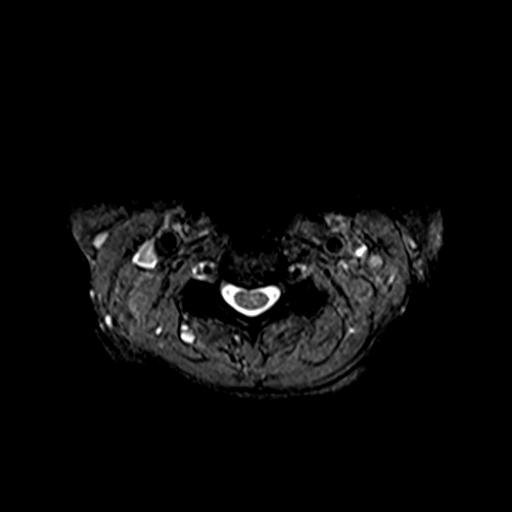
[im 24/29]
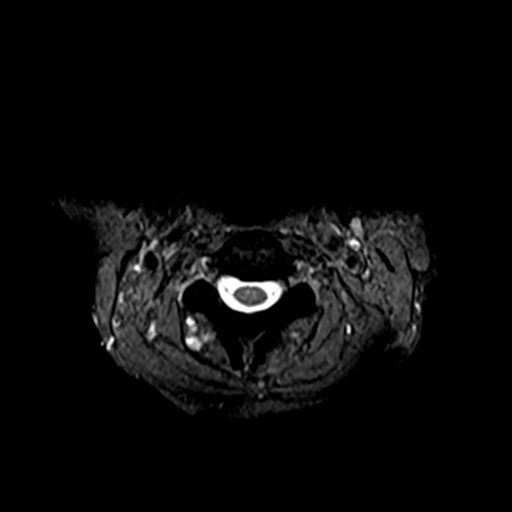
[im 29/29]
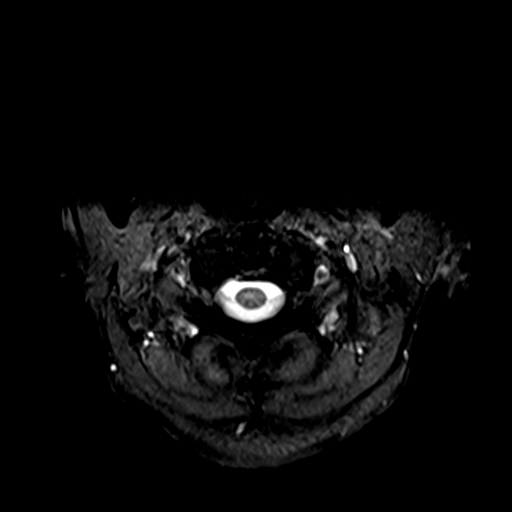

[39 of 48 positions shown; findings below may reference images not displayed]

FINDINGS: Alignment: There is straightening of the normal cervical lordosis.

Vertebrae: No fracture, evidence of discitis, or bone lesion.

Cord: Normal signal throughout.

Posterior Fossa, vertebral arteries, paraspinal tissues: Negative.

Disc levels:

C2-3: Negative.

C3-4: Shallow broad-based central protrusion nearly effaces the
ventral thecal sac. The foramina are open. Mild facet degenerative
disease noted.

C4-5: Loss of disc space height with a shallow broad-based central
protrusion and some uncovertebral disease. Mild facet arthropathy.
Disc effaces the ventral thecal sac. Mild to moderate foraminal
narrowing is worse on the left.

C5-6: Shallow broad-based disc bulge and mild uncovertebral disease.
The ventral thecal sac is narrowed but not effaced. Mild left
foraminal narrowing. The right foramen is open.

C6-7: Mild disc bulge and uncovertebral disease without central
canal or foraminal stenosis.

C7-T1: Negative.
IMPRESSION: Spondylosis appears most notable at C4-5 where a shallow broad-based
central protrusion effaces the ventral thecal sac and mild to
moderate foraminal narrowing is worse on the left.

Disc bulges at C3-4 and C5-6 narrow but do not efface the ventral
thecal sac.

## 2020-05-25 ENCOUNTER — Other Ambulatory Visit: Payer: Medicare Other

## 2020-05-25 DIAGNOSIS — E042 Nontoxic multinodular goiter: Secondary | ICD-10-CM | POA: Diagnosis not present

## 2020-05-25 DIAGNOSIS — E890 Postprocedural hypothyroidism: Secondary | ICD-10-CM | POA: Diagnosis not present

## 2020-05-25 DIAGNOSIS — C50019 Malignant neoplasm of nipple and areola, unspecified female breast: Secondary | ICD-10-CM | POA: Diagnosis not present

## 2020-05-29 ENCOUNTER — Encounter: Payer: Self-pay | Admitting: Internal Medicine

## 2020-05-31 ENCOUNTER — Other Ambulatory Visit (INDEPENDENT_AMBULATORY_CARE_PROVIDER_SITE_OTHER): Payer: Medicare Other

## 2020-05-31 ENCOUNTER — Other Ambulatory Visit: Payer: Self-pay

## 2020-05-31 DIAGNOSIS — E876 Hypokalemia: Secondary | ICD-10-CM

## 2020-05-31 LAB — BASIC METABOLIC PANEL
BUN: 12 mg/dL (ref 6–23)
CO2: 26 mEq/L (ref 19–32)
Calcium: 9.3 mg/dL (ref 8.4–10.5)
Chloride: 106 mEq/L (ref 96–112)
Creatinine, Ser: 0.86 mg/dL (ref 0.40–1.20)
GFR: 66.62 mL/min (ref >60.00)
Glucose, Bld: 94 mg/dL (ref 70–99)
Potassium: 3.9 mEq/L (ref 3.5–5.1)
Sodium: 138 mEq/L (ref 135–145)

## 2020-06-04 ENCOUNTER — Inpatient Hospital Stay: Payer: Medicare Other

## 2020-06-05 DIAGNOSIS — E042 Nontoxic multinodular goiter: Secondary | ICD-10-CM | POA: Diagnosis not present

## 2020-06-05 DIAGNOSIS — I10 Essential (primary) hypertension: Secondary | ICD-10-CM | POA: Diagnosis not present

## 2020-06-05 DIAGNOSIS — E890 Postprocedural hypothyroidism: Secondary | ICD-10-CM | POA: Diagnosis not present

## 2020-06-19 DIAGNOSIS — Z20822 Contact with and (suspected) exposure to covid-19: Secondary | ICD-10-CM | POA: Diagnosis not present

## 2020-06-19 DIAGNOSIS — Z03818 Encounter for observation for suspected exposure to other biological agents ruled out: Secondary | ICD-10-CM | POA: Diagnosis not present

## 2020-07-04 ENCOUNTER — Encounter: Payer: Self-pay | Admitting: Internal Medicine

## 2020-07-04 DIAGNOSIS — C250 Malignant neoplasm of head of pancreas: Secondary | ICD-10-CM | POA: Diagnosis not present

## 2020-07-04 DIAGNOSIS — Z90410 Acquired total absence of pancreas: Secondary | ICD-10-CM | POA: Diagnosis not present

## 2020-07-04 DIAGNOSIS — E876 Hypokalemia: Secondary | ICD-10-CM | POA: Diagnosis not present

## 2020-07-04 DIAGNOSIS — Z9689 Presence of other specified functional implants: Secondary | ICD-10-CM | POA: Diagnosis not present

## 2020-07-11 ENCOUNTER — Telehealth: Payer: Self-pay | Admitting: Oncology

## 2020-07-11 NOTE — Telephone Encounter (Signed)
Pt had her port flushed on 1/26 at [facility] and needs to r/s all future port flushes. All appts moved and sending updated AVS via mail.

## 2020-07-23 ENCOUNTER — Other Ambulatory Visit: Payer: Self-pay | Admitting: Internal Medicine

## 2020-07-23 DIAGNOSIS — G629 Polyneuropathy, unspecified: Secondary | ICD-10-CM

## 2020-07-23 DIAGNOSIS — I10 Essential (primary) hypertension: Secondary | ICD-10-CM

## 2020-07-23 DIAGNOSIS — E876 Hypokalemia: Secondary | ICD-10-CM

## 2020-07-26 ENCOUNTER — Other Ambulatory Visit: Payer: Self-pay

## 2020-07-26 ENCOUNTER — Ambulatory Visit
Admission: RE | Admit: 2020-07-26 | Discharge: 2020-07-26 | Disposition: A | Discharge status: Home / Self Care | Payer: Medicare Other | Source: Ambulatory Visit | Attending: Internal Medicine | Admitting: Internal Medicine

## 2020-07-26 DIAGNOSIS — Z1231 Encounter for screening mammogram for malignant neoplasm of breast: Secondary | ICD-10-CM | POA: Insufficient documentation

## 2020-08-01 ENCOUNTER — Inpatient Hospital Stay: Payer: Medicare Other | Attending: Oncology

## 2020-08-01 DIAGNOSIS — Z87891 Personal history of nicotine dependence: Secondary | ICD-10-CM | POA: Diagnosis not present

## 2020-08-01 DIAGNOSIS — Z86718 Personal history of other venous thrombosis and embolism: Secondary | ICD-10-CM | POA: Insufficient documentation

## 2020-08-01 DIAGNOSIS — Z8507 Personal history of malignant neoplasm of pancreas: Secondary | ICD-10-CM | POA: Diagnosis not present

## 2020-08-01 DIAGNOSIS — Z79899 Other long term (current) drug therapy: Secondary | ICD-10-CM | POA: Insufficient documentation

## 2020-08-01 DIAGNOSIS — G629 Polyneuropathy, unspecified: Secondary | ICD-10-CM | POA: Insufficient documentation

## 2020-08-01 DIAGNOSIS — Z9221 Personal history of antineoplastic chemotherapy: Secondary | ICD-10-CM | POA: Insufficient documentation

## 2020-08-01 DIAGNOSIS — Z452 Encounter for adjustment and management of vascular access device: Secondary | ICD-10-CM | POA: Diagnosis not present

## 2020-08-01 DIAGNOSIS — Z7982 Long term (current) use of aspirin: Secondary | ICD-10-CM | POA: Diagnosis not present

## 2020-08-01 DIAGNOSIS — Z95828 Presence of other vascular implants and grafts: Secondary | ICD-10-CM

## 2020-08-01 MED ORDER — SODIUM CHLORIDE 0.9% FLUSH
10.0000 mL | INTRAVENOUS | Status: DC | PRN
  Administered 2020-08-01: 10 mL via INTRAVENOUS
  Filled 2020-08-01: qty 10

## 2020-08-01 MED ORDER — HEPARIN SOD (PORK) LOCK FLUSH 100 UNIT/ML IV SOLN
500.0000 [IU] | Freq: Once | INTRAVENOUS | Status: AC
  Administered 2020-08-01: 500 [IU] via INTRAVENOUS
  Filled 2020-08-01: qty 5

## 2020-08-23 ENCOUNTER — Telehealth: Payer: Self-pay

## 2020-08-23 NOTE — Telephone Encounter (Signed)
Faxed back medical clarification request on Marja Kays order # [id number] to [phone] on 08/23/20

## 2020-08-29 ENCOUNTER — Inpatient Hospital Stay: Payer: Medicare Other | Attending: Oncology

## 2020-08-29 DIAGNOSIS — Z95828 Presence of other vascular implants and grafts: Secondary | ICD-10-CM | POA: Insufficient documentation

## 2020-08-29 DIAGNOSIS — C250 Malignant neoplasm of head of pancreas: Secondary | ICD-10-CM | POA: Insufficient documentation

## 2020-08-29 DIAGNOSIS — Z452 Encounter for adjustment and management of vascular access device: Secondary | ICD-10-CM | POA: Diagnosis not present

## 2020-08-29 MED ORDER — HEPARIN SOD (PORK) LOCK FLUSH 100 UNIT/ML IV SOLN
INTRAVENOUS | Status: AC
  Filled 2020-08-29: qty 5

## 2020-08-29 MED ORDER — SODIUM CHLORIDE 0.9% FLUSH
10.0000 mL | INTRAVENOUS | Status: DC | PRN
  Administered 2020-08-29: 10 mL via INTRAVENOUS
  Filled 2020-08-29: qty 10

## 2020-08-29 MED ORDER — HEPARIN SOD (PORK) LOCK FLUSH 100 UNIT/ML IV SOLN
500.0000 [IU] | Freq: Once | INTRAVENOUS | Status: AC
  Administered 2020-08-29: 500 [IU] via INTRAVENOUS
  Filled 2020-08-29: qty 5

## 2020-08-29 NOTE — Telephone Encounter (Signed)
[facility] called they are needing clarification on a fax that they received #[id number] Please call [phone]

## 2020-08-30 NOTE — Telephone Encounter (Signed)
Called and clarified that the potassium script is: potassium chloride (KLOR-CON) 10 MEQ tablet [[id number]]

## 2020-09-04 LAB — HEMOGLOBIN A1C: Hemoglobin A1C: 6

## 2020-09-12 ENCOUNTER — Encounter: Payer: Self-pay | Admitting: Internal Medicine

## 2020-09-12 ENCOUNTER — Other Ambulatory Visit: Payer: Self-pay

## 2020-09-12 ENCOUNTER — Ambulatory Visit (INDEPENDENT_AMBULATORY_CARE_PROVIDER_SITE_OTHER): Payer: Medicare Other | Admitting: Internal Medicine

## 2020-09-12 VITALS — BP 126/74 | HR 71 | Temp 98.1°F | Ht 62.99 in | Wt 163.2 lb

## 2020-09-12 DIAGNOSIS — E039 Hypothyroidism, unspecified: Secondary | ICD-10-CM | POA: Diagnosis not present

## 2020-09-12 DIAGNOSIS — R030 Elevated blood-pressure reading, without diagnosis of hypertension: Secondary | ICD-10-CM

## 2020-09-12 DIAGNOSIS — R7303 Prediabetes: Secondary | ICD-10-CM | POA: Diagnosis not present

## 2020-09-12 DIAGNOSIS — Z1389 Encounter for screening for other disorder: Secondary | ICD-10-CM

## 2020-09-12 DIAGNOSIS — Z1231 Encounter for screening mammogram for malignant neoplasm of breast: Secondary | ICD-10-CM

## 2020-09-12 MED ORDER — LEVOTHYROXINE SODIUM 100 MCG PO TABS: 100.0000 ug | ORAL_TABLET | Freq: Every day | ORAL | 3 refills | Status: DC

## 2020-09-12 NOTE — Progress Notes (Signed)
Chief Complaint  Patient presents with  . Follow-up   Annual doing well  1.09/04/20 A1C 6.0 disc healthy diet and exercise  2. hypothyoridism on 100 mcg qd    Review of Systems  Constitutional: Positive for weight loss.  HENT: Negative for hearing loss.   Eyes: Negative for blurred vision.  Respiratory: Negative for shortness of breath.   Cardiovascular: Negative for chest pain.  Gastrointestinal: Negative for abdominal pain and nausea.  Musculoskeletal: Negative for falls and joint pain.  Skin: Negative for rash.  Neurological: Negative for headaches.  Psychiatric/Behavioral: Negative for depression.   Past Medical History:  Diagnosis Date  . Anemia due to antineoplastic chemotherapy 11/25/2018  . Cancer ([place])   . GERD (gastroesophageal reflux disease)   . Hypertension   . Hypomagnesemia 11/25/2018  . Hypothyroidism   . Malignant neoplasm of head of pancreas ([place]) 06/08/2018  . Personal history of chemotherapy 2019    Pancreatic cancer   Past Surgical History:  Procedure Laterality Date  . CHOLECYSTECTOMY    . COLONOSCOPY N/A 12/02/2016   Procedure: COLONOSCOPY;  Surgeon: Lollie Sails, MD;  Location: [facility] ENDOSCOPY;  Service: Endoscopy;  Laterality: N/A;  . ERCP N/A 04/30/2018   Procedure: ENDOSCOPIC RETROGRADE CHOLANGIOPANCREATOGRAPHY (ERCP);  Surgeon: Lucilla Lame, MD;  Location: [facility] ENDOSCOPY;  Service: Endoscopy;  Laterality: N/A;  . ERCP N/A 05/05/2018   Procedure: ENDOSCOPIC RETROGRADE CHOLANGIOPANCREATOGRAPHY (ERCP);  Surgeon: Lucilla Lame, MD;  Location: [facility] ENDOSCOPY;  Service: Endoscopy;  Laterality: N/A;  . PORTA CATH INSERTION N/A 06/28/2018   Procedure: PORTA CATH INSERTION;  Surgeon: Algernon Huxley, MD;  Location: [place] [facility] LAB;  Service: Cardiovascular;  Laterality: N/A;   Family History  Adopted: Yes  Problem Relation Age of Onset  . Diabetes Mellitus I Other   . Alcoholism Other   . Hypertension Other   . Hyperlipidemia Other   . Coronary  artery disease Other   . Stroke Other   . Osteoarthritis Other   . Migraines Other   . Heart Problems Mother   . Stroke Sister   . CAD Brother   . Stroke Brother   . Breast cancer Neg Hx    Social History   Socioeconomic History  . Marital status: Married    Spouse name: Not on file  . Number of children: Not on file  . Years of education: Not on file  . Highest education level: Not on file  Occupational History  . Not on file  Tobacco Use  . Smoking status: Former Smoker    Quit date: 04/14/1988    Years since quitting: 32.4  . Smokeless tobacco: Never Used  Vaping Use  . Vaping Use: Never used  Substance and Sexual Activity  . Alcohol use: Yes    Comment: social drinker  . Drug use: No  . Sexual activity: Not Currently  Other Topics Concern  . Not on file  Social History Narrative   Lives at home married    In order of [place] star   Social Determinants of Health   Financial Resource Strain: Low Risk   . Difficulty of Paying Living Expenses: Not hard at all  Food Insecurity: No Food Insecurity  . Worried About Charity fundraiser in the Last Year: Never true  . Ran Out of Food in the Last Year: Never true  Transportation Needs: No Transportation Needs  . Lack of Transportation (Medical): No  . Lack of Transportation (Non-Medical): No  Physical Activity: Not on file  Stress: No Stress Concern Present  . Feeling of Stress : Not at all  Social Connections: Unknown  . Frequency of Communication with Friends and Family: Not on file  . Frequency of Social Gatherings with Friends and Family: Not on file  . Attends Religious Services: Not on file  . Active Member of Clubs or Organizations: Not on file  . Attends Archivist Meetings: Not on file  . Marital Status: Married  Human resources officer Violence: Not At Risk  . Fear of Current or Ex-Partner: No  . Emotionally Abused: No  . Physically Abused: No  . Sexually Abused: No   Current Meds  Medication Sig   . acetaminophen-codeine (TYLENOL #3) 300-30 MG tablet Take 1 tablet by mouth every 4 (four) hours as needed for moderate pain.  Marland Kitchen ascorbic acid (VITAMIN C) 1000 MG tablet Take by mouth.  Marland Kitchen aspirin (ASPIRIN 81) 81 MG EC tablet Take 2 tablets (162 mg total) by mouth daily. Swallow whole.  . Cholecalciferol (VITAMIN D-3 PO) Take 50 mcg by mouth daily.   . diclofenac Sodium (VOLTAREN) 1 % GEL Apply topically 4 (four) times daily.  . fluticasone (FLONASE) 50 MCG/ACT nasal spray Place 2 sprays into both nostrils daily. After nasal saline  . gabapentin (NEURONTIN) 400 MG capsule TAKE 1 CAPSULE BY MOUTH 3  TIMES DAILY  . hydrochlorothiazide (HYDRODIURIL) 12.5 MG tablet TAKE 1 TABLET BY MOUTH  DAILY IN THE MORNING  . hydrocortisone 2.5 % ointment Apply topically 2 (two) times daily. Prn  . Magnesium Cl-Calcium Carbonate (SLOW-MAG PO) Take by mouth. 2 QD  . Multiple Vitamins-Minerals (CENTRUM SILVER 50+WOMEN PO) Take by mouth.  . ondansetron (ZOFRAN) 4 MG tablet TAKE 1 TABLET BY MOUTH  EVERY 6 HOURS AS NEEDED FOR NAUSEA OR VOMITING.  . potassium chloride (KLOR-CON) 10 MEQ tablet TAKE 1 TABLET BY MOUTH  DAILY  . Simethicone 125 MG CAPS Take by mouth.  . sodium chloride (OCEAN) 0.65 % SOLN nasal spray Place 2 sprays into both nostrils daily as needed for congestion.  . TURMERIC CURCUMIN PO Take by mouth 3 (three) times daily with meals.  . [DISCONTINUED] CREON 36000-114000 units CPEP capsule TAKE 1 CAPSULE BY MOUTH THREE TIMES DAILY WITH MEALS  . [DISCONTINUED] levothyroxine (SYNTHROID) 112 MCG tablet Take 1 tablet (112 mcg total) by mouth daily before breakfast. (Patient taking differently: Take 100 mcg by mouth daily before breakfast.)   No Known Allergies Recent Results (from the past 2160 hour(s))  Hemoglobin A1c     Status: None   Collection Time: 09/04/20 12:00 AM  Result Value Ref Range   Hemoglobin A1C 6.0    Objective  Body mass index is 28.92 kg/m. Wt Readings from Last 3 Encounters:   09/12/20 163 lb 3.2 oz (74 kg)  04/27/20 169 lb 4.8 oz (76.8 kg)  03/22/20 172 lb 3.2 oz (78.1 kg)   Temp Readings from Last 3 Encounters:  09/12/20 98.1 F (36.7 C) (Oral)  04/27/20 (!) 96.7 F (35.9 C)  03/22/20 98.1 F (36.7 C) (Oral)   BP Readings from Last 3 Encounters:  09/12/20 126/74  04/27/20 124/72  03/22/20 126/74   Pulse Readings from Last 3 Encounters:  09/12/20 71  04/27/20 69  03/22/20 71    Physical Exam Vitals and nursing note reviewed.  Constitutional:      Appearance: Normal appearance. She is well-developed, well-groomed and overweight.  HENT:     Head: Normocephalic and atraumatic.  Cardiovascular:     Rate and Rhythm:  Normal rate and regular rhythm.     Heart sounds: Normal heart sounds. No murmur heard.   Pulmonary:     Effort: Pulmonary effort is normal.     Breath sounds: Normal breath sounds.  Abdominal:     Tenderness: There is no abdominal tenderness.  Skin:    General: Skin is warm and dry.  Neurological:     General: No focal deficit present.     Mental Status: She is alert and oriented to person, place, and time. Mental status is at baseline.     Gait: Gait normal.  Psychiatric:        Attention and Perception: Attention and perception normal.        Mood and Affect: Mood and affect normal.        Speech: Speech normal.        Behavior: Behavior normal. Behavior is cooperative.        Thought Content: Thought content normal.        Cognition and Memory: Cognition and memory normal.        Judgment: Judgment normal.     Assessment  Plan  Prediabetes 09/04/20 6.0  Hypothyroidism, unspecified type - Plan: levothyroxine (SYNTHROID) 100 MCG tablet Acquired hypothyroidism - Plan: levothyroxine (SYNTHROID) 100 MCG tablet  Fasting labs 10/6/ or 03/15/21   HM Flu shot declines Prevnar, pna 23 declines  Tdap, shingrix declines Pfizer 3/3 4th dose 09/14/20   mammo  2/17/22negative  Colonoscopy 12/02/16 sessile polyp and  tubular adenoma [facility] Check with GI to see when due  CT ab/pelvis given history due 12/2020   No pap due dexa 07/27/15 normal rec centrum silver or nature made mvt with iron  Provider: Dr. Olivia Mackie McLean-Scocuzza-Internal Medicine

## 2020-09-12 NOTE — Patient Instructions (Signed)
Alert me when you get the CT scan abdomen/pelvis please  A1C 5.6 or less no prediabetes  5.7-6.4 prediabetes  >6.5 diabetes    Monitor ~ every 6 months     Diabetes Care, 44(Suppl 1), N27-P82. https://doi.org/https://doi.org/10.2337/dc21-[email or web address]">  Prediabetes Prediabetes is when your blood sugar (blood glucose) level is higher than normal but not high enough for you to be diagnosed with type 2 diabetes. Having prediabetes puts you at risk for developing type 2 diabetes (type 2 diabetes mellitus). With certain lifestyle changes, you may be able to prevent or delay the onset of type 2 diabetes. This is important because type 2 diabetes can lead to serious complications, such as:  Heart disease.  Stroke.  Blindness.  Kidney disease.  Depression.  Poor circulation in the feet and legs. In severe cases, this could lead to surgical removal of a leg (amputation). What are the causes? The exact cause of prediabetes is not known. It may result from insulin resistance. Insulin resistance develops when cells in the body do not respond properly to insulin that the body makes. This can cause excess glucose to build up in the blood. High blood glucose (hyperglycemia) can develop. What increases the risk? The following factors may make you more likely to develop this condition:  You have a family member with type 2 diabetes.  You are older than 45 years.  You had a temporary form of diabetes during a pregnancy (gestational diabetes).  You had polycystic ovary syndrome (PCOS).  You are overweight or obese.  You are inactive (sedentary).  You have a history of heart disease, including problems with cholesterol levels, high levels of blood fats, or high blood pressure. What are the signs or symptoms? You may have no symptoms. If you do have symptoms, they may include:  Increased hunger.  Increased thirst.  Increased urination.  Vision changes, such as blurry vision.  Tiredness  (fatigue). How is this diagnosed? This condition can be diagnosed with blood tests. Your blood glucose may be checked with one or more of the following tests:  A fasting blood glucose (FBG) test. You will not be allowed to eat (you will fast) for at least 8 hours before a blood sample is taken.  An A1C blood test (hemoglobin A1C). This test provides information about blood glucose levels over the previous 2?3 months.  An oral glucose tolerance test (OGTT). This test measures your blood glucose at two points in time: ? After fasting. This is your baseline level. ? Two hours after you drink a beverage that contains glucose. You may be diagnosed with prediabetes if:  Your FBG is 100?125 mg/dL (5.6-6.9 mmol/L).  Your A1C level is 5.7?6.4% (39-46 mmol/mol).  Your OGTT result is 140?199 mg/dL (7.8-11 mmol/L). These blood tests may be repeated to confirm your diagnosis.   How is this treated? Treatment may include dietary and lifestyle changes to help lower your blood glucose and prevent type 2 diabetes from developing. In some cases, medicine may be prescribed to help lower the risk of type 2 diabetes. Follow these instructions at home: Nutrition  Follow a healthy meal plan. This includes eating lean proteins, whole grains, legumes, fresh fruits and vegetables, low-fat dairy products, and healthy fats.  Follow instructions from your health care provider about eating or drinking restrictions.  Meet with a dietitian to create a healthy eating plan that is right for you.   Lifestyle  Do moderate-intensity exercise for at least 30 minutes a day on 5 or more days  each week, or as told by your health care provider. A mix of activities may be best, such as: ? Brisk walking, swimming, biking, and weight lifting.  Lose weight as told by your health care provider. Losing 5-7% of your body weight can reverse insulin resistance.  Do not drink alcohol if: ? Your health care provider tells you not  to drink. ? You are pregnant, may be pregnant, or are planning to become pregnant.  If you drink alcohol: ? Limit how much you use to:  0-1 drink a day for women.  0-2 drinks a day for men. ? Be aware of how much alcohol is in your drink. In the [place] one drink equals one 12 oz bottle of beer (355 mL), one 5 oz glass of wine (148 mL), or one 1 oz glass of hard liquor (44 mL). General instructions  Take over-the-counter and prescription medicines only as told by your health care provider. You may be prescribed medicines that help lower the risk of type 2 diabetes.  Do not use any products that contain nicotine or tobacco, such as cigarettes, e-cigarettes, and chewing tobacco. If you need help quitting, ask your health care provider.  Keep all follow-up visits. This is important. Where to find more information  American Diabetes Association: www.diabetes.org  Academy of Nutrition and Dietetics: www.eatright.org  American Heart Association: www.heart.org Contact a health care provider if:  You have any of these symptoms: ? Increased hunger. ? Increased urination. ? Increased thirst. ? Fatigue. ? Vision changes, such as blurry vision. Get help right away if you:  Have shortness of breath.  Feel confused.  Vomit or feel like you may vomit. Summary  Prediabetes is when your blood sugar (blood glucose)level is higher than normal but not high enough for you to be diagnosed with type 2 diabetes.  Having prediabetes puts you at risk for developing type 2 diabetes (type 2 diabetes mellitus).  Make lifestyle changes such as eating a healthy diet and exercising regularly to help prevent diabetes. Lose weight as told by your health care provider. This information is not intended to replace advice given to you by your health care provider. Make sure you discuss any questions you have with your health care provider. Document Revised: 08/25/2019 Document Reviewed: 08/25/2019 Elsevier  Patient Education  2021 [place].  Prediabetes Eating Plan Prediabetes is a condition that causes blood sugar (glucose) levels to be higher than normal. This increases the risk for developing type 2 diabetes (type 2 diabetes mellitus). Working with a health care provider or nutrition specialist (dietitian) to make diet and lifestyle changes can help prevent the onset of diabetes. These changes may help you:  Control your blood glucose levels.  Improve your cholesterol levels.  Manage your blood pressure. What are tips for following this plan? Reading food labels  Read food labels to check the amount of fat, salt (sodium), and sugar in prepackaged foods. Avoid foods that have: ? Saturated fats. ? Trans fats. ? Added sugars.  Avoid foods that have more than 300 milligrams (mg) of sodium per serving. Limit your sodium intake to less than 2,300 mg each day. Shopping  Avoid buying pre-made and processed foods.  Avoid buying drinks with added sugar. Cooking  Cook with olive oil. Do not use butter, lard, or ghee.  Bake, broil, grill, steam, or boil foods. Avoid frying. Meal planning  Work with your dietitian to create an eating plan that is right for you. This may include tracking how  many calories you take in each day. Use a food diary, notebook, or mobile application to track what you eat at each meal.  Consider following a Mediterranean diet. This includes: ? Eating several servings of fresh fruits and vegetables each day. ? Eating fish at least twice a week. ? Eating one serving each day of whole grains, beans, nuts, and seeds. ? Using olive oil instead of other fats. ? Limiting alcohol. ? Limiting red meat. ? Using nonfat or low-fat dairy products.  Consider following a plant-based diet. This includes dietary choices that focus on eating mostly vegetables and fruit, grains, beans, nuts, and seeds.  If you have high blood pressure, you may need to limit your sodium  intake or follow a diet such as the DASH (Dietary Approaches to Stop Hypertension) eating plan. The DASH diet aims to lower high blood pressure.   Lifestyle  Set weight loss goals with help from your health care team. It is recommended that most people with prediabetes lose 7% of their body weight.  Exercise for at least 30 minutes 5 or more days a week.  Attend a support group or seek support from a mental health counselor.  Take over-the-counter and prescription medicines only as told by your health care provider. What foods are recommended? Fruits Berries. Bananas. Apples. Oranges. Grapes. Papaya. Mango. Pomegranate. Kiwi. Grapefruit. Cherries. Vegetables Lettuce. Spinach. Peas. Beets. Cauliflower. Cabbage. Broccoli. Carrots. Tomatoes. Squash. Eggplant. Herbs. Peppers. Onions. Cucumbers. Brussels sprouts. Grains Whole grains, such as whole-wheat or whole-grain breads, crackers, cereals, and pasta. Unsweetened oatmeal. Bulgur. Barley. Quinoa. Brown rice. Corn or whole-wheat flour tortillas or taco shells. Meats and other proteins Seafood. Poultry without skin. Lean cuts of pork and beef. Tofu. Eggs. Nuts. Beans. Dairy Low-fat or fat-free dairy products, such as yogurt, cottage cheese, and cheese. Beverages Water. Tea. Coffee. Sugar-free or diet soda. Seltzer water. Low-fat or nonfat milk. Milk alternatives, such as soy or almond milk. Fats and oils Olive oil. Canola oil. Sunflower oil. Grapeseed oil. Avocado. Walnuts. Sweets and desserts Sugar-free or low-fat pudding. Sugar-free or low-fat ice cream and other frozen treats. Seasonings and condiments Herbs. Sodium-free spices. Mustard. Relish. Low-salt, low-sugar ketchup. Low-salt, low-sugar barbecue sauce. Low-fat or fat-free mayonnaise. The items listed above may not be a complete list of recommended foods and beverages. Contact a dietitian for more information. What foods are not recommended? Fruits Fruits canned with  syrup. Vegetables Canned vegetables. Frozen vegetables with butter or cream sauce. Grains Refined white flour and flour products, such as bread, pasta, snack foods, and cereals. Meats and other proteins Fatty cuts of meat. Poultry with skin. Breaded or fried meat. Processed meats. Dairy Full-fat yogurt, cheese, or milk. Beverages Sweetened drinks, such as iced tea and soda. Fats and oils Butter. Lard. Ghee. Sweets and desserts Baked goods, such as cake, cupcakes, pastries, cookies, and cheesecake. Seasonings and condiments Spice mixes with added salt. Ketchup. Barbecue sauce. Mayonnaise. The items listed above may not be a complete list of foods and beverages that are not recommended. Contact a dietitian for more information. Where to find more information  American Diabetes Association: www.diabetes.org Summary  You may need to make diet and lifestyle changes to help prevent the onset of diabetes. These changes can help you control blood sugar, improve cholesterol levels, and manage blood pressure.  Set weight loss goals with help from your health care team. It is recommended that most people with prediabetes lose 7% of their body weight.  Consider following a Mediterranean diet. This  includes eating plenty of fresh fruits and vegetables, whole grains, beans, nuts, seeds, fish, and low-fat dairy, and using olive oil instead of other fats. This information is not intended to replace advice given to you by your health care provider. Make sure you discuss any questions you have with your health care provider. Document Revised: 08/25/2019 Document Reviewed: 08/25/2019 Elsevier Patient Education  [place].

## 2020-09-20 ENCOUNTER — Ambulatory Visit: Payer: Medicare Other | Admitting: Internal Medicine

## 2020-09-26 ENCOUNTER — Inpatient Hospital Stay: Payer: Medicare Other | Attending: Oncology

## 2020-09-26 DIAGNOSIS — Z452 Encounter for adjustment and management of vascular access device: Secondary | ICD-10-CM | POA: Diagnosis not present

## 2020-09-26 DIAGNOSIS — Z95828 Presence of other vascular implants and grafts: Secondary | ICD-10-CM

## 2020-09-26 DIAGNOSIS — C250 Malignant neoplasm of head of pancreas: Secondary | ICD-10-CM | POA: Insufficient documentation

## 2020-09-26 MED ORDER — SODIUM CHLORIDE 0.9% FLUSH
10.0000 mL | INTRAVENOUS | Status: DC | PRN
Start: 2020-09-26 — End: 2020-09-26
  Administered 2020-09-26: 10 mL via INTRAVENOUS
  Filled 2020-09-26: qty 10

## 2020-09-26 MED ORDER — HEPARIN SOD (PORK) LOCK FLUSH 100 UNIT/ML IV SOLN
500.0000 [IU] | Freq: Once | INTRAVENOUS | Status: AC
Start: 2020-09-26 — End: 2020-09-26
  Administered 2020-09-26: 500 [IU] via INTRAVENOUS
  Filled 2020-09-26: qty 5

## 2020-09-26 MED ORDER — HEPARIN SOD (PORK) LOCK FLUSH 100 UNIT/ML IV SOLN
INTRAVENOUS | Status: AC
  Filled 2020-09-26: qty 5

## 2020-10-14 ENCOUNTER — Encounter: Payer: Self-pay | Admitting: Internal Medicine

## 2020-10-15 DIAGNOSIS — Z20822 Contact with and (suspected) exposure to covid-19: Secondary | ICD-10-CM | POA: Diagnosis not present

## 2020-10-15 DIAGNOSIS — H9203 Otalgia, bilateral: Secondary | ICD-10-CM | POA: Diagnosis not present

## 2020-10-15 DIAGNOSIS — B349 Viral infection, unspecified: Secondary | ICD-10-CM | POA: Diagnosis not present

## 2020-10-15 DIAGNOSIS — M791 Myalgia, unspecified site: Secondary | ICD-10-CM | POA: Diagnosis not present

## 2020-10-15 NOTE — Telephone Encounter (Signed)
Called to schedule virtual for today. There was one slot open with Dr Caryl Bis. Someone scheduled another Patient in the slot as I was talking with Ms Ellwanger. Informed the Patient that only appointments were available tomorrow, 10/15/20.  Patient declined and states she will go to be seen at urgent care.

## 2020-10-16 NOTE — Telephone Encounter (Signed)
As previously documented, Patient declined appointment and states she will go to urgent care.

## 2020-10-26 ENCOUNTER — Encounter: Payer: Self-pay | Admitting: Oncology

## 2020-10-26 ENCOUNTER — Other Ambulatory Visit: Payer: Self-pay

## 2020-10-26 ENCOUNTER — Inpatient Hospital Stay: Payer: Medicare Other | Attending: Oncology

## 2020-10-26 ENCOUNTER — Inpatient Hospital Stay (HOSPITAL_BASED_OUTPATIENT_CLINIC_OR_DEPARTMENT_OTHER): Payer: Medicare Other | Admitting: Oncology

## 2020-10-26 VITALS — BP 116/75 | HR 80 | Temp 97.3°F | Resp 18 | Wt 159.6 lb

## 2020-10-26 DIAGNOSIS — R978 Other abnormal tumor markers: Secondary | ICD-10-CM

## 2020-10-26 DIAGNOSIS — C250 Malignant neoplasm of head of pancreas: Secondary | ICD-10-CM | POA: Insufficient documentation

## 2020-10-26 DIAGNOSIS — E871 Hypo-osmolality and hyponatremia: Secondary | ICD-10-CM

## 2020-10-26 DIAGNOSIS — Z86718 Personal history of other venous thrombosis and embolism: Secondary | ICD-10-CM | POA: Insufficient documentation

## 2020-10-26 DIAGNOSIS — G629 Polyneuropathy, unspecified: Secondary | ICD-10-CM | POA: Insufficient documentation

## 2020-10-26 DIAGNOSIS — Z79899 Other long term (current) drug therapy: Secondary | ICD-10-CM | POA: Diagnosis not present

## 2020-10-26 DIAGNOSIS — Z9049 Acquired absence of other specified parts of digestive tract: Secondary | ICD-10-CM | POA: Insufficient documentation

## 2020-10-26 DIAGNOSIS — Z791 Long term (current) use of non-steroidal anti-inflammatories (NSAID): Secondary | ICD-10-CM | POA: Diagnosis not present

## 2020-10-26 DIAGNOSIS — Z1389 Encounter for screening for other disorder: Secondary | ICD-10-CM

## 2020-10-26 DIAGNOSIS — Z87891 Personal history of nicotine dependence: Secondary | ICD-10-CM | POA: Insufficient documentation

## 2020-10-26 DIAGNOSIS — I10 Essential (primary) hypertension: Secondary | ICD-10-CM | POA: Insufficient documentation

## 2020-10-26 DIAGNOSIS — R7989 Other specified abnormal findings of blood chemistry: Secondary | ICD-10-CM | POA: Diagnosis not present

## 2020-10-26 DIAGNOSIS — Z7982 Long term (current) use of aspirin: Secondary | ICD-10-CM | POA: Diagnosis not present

## 2020-10-26 LAB — CBC WITH DIFFERENTIAL/PLATELET
Abs Immature Granulocytes: 0.01 10*3/uL (ref 0.00–0.07)
Basophils Absolute: 0 10*3/uL (ref 0.0–0.1)
Basophils Relative: 1 %
Eosinophils Absolute: 0.1 10*3/uL (ref 0.0–0.5)
Eosinophils Relative: 1 %
HCT: 33.2 % — ABNORMAL LOW (ref 36.0–46.0)
Hemoglobin: 11 g/dL — ABNORMAL LOW (ref 12.0–15.0)
Immature Granulocytes: 0 %
Lymphocytes Relative: 31 %
Lymphs Abs: 2.6 10*3/uL (ref 0.7–4.0)
MCH: 28.7 pg (ref 26.0–34.0)
MCHC: 33.1 g/dL (ref 30.0–36.0)
MCV: 86.7 fL (ref 80.0–100.0)
Monocytes Absolute: 0.6 10*3/uL (ref 0.1–1.0)
Monocytes Relative: 7 %
Neutro Abs: 5.2 10*3/uL (ref 1.7–7.7)
Neutrophils Relative %: 60 %
Platelets: 196 10*3/uL (ref 150–400)
RBC: 3.83 MIL/uL — ABNORMAL LOW (ref 3.87–5.11)
RDW: 15.1 % (ref 11.5–15.5)
WBC: 8.4 10*3/uL (ref 4.0–10.5)
nRBC: 0 % (ref 0.0–0.2)

## 2020-10-26 LAB — COMPREHENSIVE METABOLIC PANEL
ALT: 16 U/L (ref 0–44)
AST: 18 U/L (ref 15–41)
Albumin: 3.7 g/dL (ref 3.5–5.0)
Alkaline Phosphatase: 68 U/L (ref 38–126)
Anion gap: 8 (ref 5–15)
BUN: 13 mg/dL (ref 8–23)
CO2: 24 mmol/L (ref 22–32)
Calcium: 9.2 mg/dL (ref 8.9–10.3)
Chloride: 101 mmol/L (ref 98–111)
Creatinine, Ser: 0.86 mg/dL (ref 0.44–1.00)
GFR, Estimated: >60 mL/min (ref >60)
Glucose, Bld: 173 mg/dL — ABNORMAL HIGH (ref 70–99)
Potassium: 3.7 mmol/L (ref 3.5–5.1)
Sodium: 133 mmol/L — ABNORMAL LOW (ref 135–145)
Total Bilirubin: 0.8 mg/dL (ref 0.3–1.2)
Total Protein: 7.1 g/dL (ref 6.5–8.1)

## 2020-10-26 LAB — RETIC PANEL
Immature Retic Fract: 15.8 % (ref 2.3–15.9)
RBC.: 3.85 MIL/uL — ABNORMAL LOW (ref 3.87–5.11)
Retic Count, Absolute: 80.9 10*3/uL (ref 19.0–186.0)
Retic Ct Pct: 2.1 % (ref 0.4–3.1)
Reticulocyte Hemoglobin: 33.8 pg (ref >27.9)

## 2020-10-26 LAB — IRON AND TIBC
Iron: 81 ug/dL (ref 28–170)
Saturation Ratios: 23 % (ref 10.4–31.8)
TIBC: 350 ug/dL (ref 250–450)
UIBC: 269 ug/dL

## 2020-10-26 LAB — FERRITIN: Ferritin: 70 ng/mL (ref 11–307)

## 2020-10-26 MED ORDER — SODIUM CHLORIDE 0.9% FLUSH
10.0000 mL | Freq: Once | INTRAVENOUS | Status: AC | PRN
  Administered 2020-10-26: 10 mL
  Filled 2020-10-26: qty 10

## 2020-10-26 MED ORDER — HEPARIN SOD (PORK) LOCK FLUSH 100 UNIT/ML IV SOLN
500.0000 [IU] | Freq: Once | INTRAVENOUS | Status: AC | PRN
  Administered 2020-10-26: 500 [IU]
  Filled 2020-10-26: qty 5

## 2020-10-26 NOTE — Progress Notes (Signed)
Pt here for follow up. No new concerns.  

## 2020-10-27 ENCOUNTER — Encounter: Payer: Self-pay | Admitting: Oncology

## 2020-10-27 LAB — CANCER ANTIGEN 19-9: CA 19-9: 45 U/mL — ABNORMAL HIGH (ref 0–35)

## 2020-10-27 NOTE — Progress Notes (Signed)
Hematology/Oncology Follow Up Note [facility]  Telephone:([phone] Fax:([phone]  Patient Care Team: McLean-Scocuzza, Nino Glow, MD as PCP - General (Internal Medicine) Clent Jacks, RN as Registered Nurse Jonathon Bellows, MD as Consulting Physician (Gastroenterology) Clent Jacks, RN as Oncology Nurse Navigator Earlie Server, MD as Consulting Physician (Oncology) Fayrene Helper Stacie Acres, MD as Referring Physician (Oncology) Algernon Huxley, MD as Referring Physician (Vascular Surgery)   Name of the patient: Lynn Taylor  [id number]  05-05-1946   REASON FOR VISIT  follow-up for adjuvant chemotherapy for pancreatic cancer.  HISTORY OF PRESENTING ILLNESS:  Lynn Taylor is a  75 y.o.  female with PMH listed below who was referred to me for evaluation of evaluation of elevated ferritin. Patient recently had lab work-up done on 04/09/2018 which showed ferritin level 1877, iron 97, TIBC 340, iron saturation 29, 04/11/2018 CBC showed hemoglobin 13.2, MCV 81, WBC 7.6, platelet counts 246,000. Patient was referred to hematology for further evaluation of elevated ferritin.  #04/28/2018 Ultrasound of liver was obtained which showed CBD and intrahepatic biliary dilatation Patient wa she is s advised to go to emergency room for further evaluation. Also had hyperbilirubinemia.  Patient was complaining about being jaundiced, pruritus all over. MR abdomen MRCP with and without contrast showed market biliary duct dilatation, secondary to obstruction in the region of pancreatic head.  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 No abdominal adenopathy, liver metastasis or cross vascular involvement.  patient was evaluated by gastroenterology and status post ERCP with stenting. CA 19.9 1173 CEA 3.9 # 05/25/2018  patient was referred to [facility] and s/p whipple resection.Her postop course was c/b Type A pancreatic leak and  c.diff. Pathology showed A. Hepatic artery lymph node, excision: One lymph node, negative for malignancy (0/1). B. Biliary stent, removal: Medical device, consistent with stent, gross examination only. C. 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 All margins are negative (closest margin=uncinate, 0.5 mm) Metastatic adenocarcinoma in one of thirty-two lymph nodes (1/32).  Portion of stomach and duodenum with no specific pathologic diagnosis.   Grade, 3 poorly differentiated.  Pathologic pT2 pN1  Cancer Treatment 07/13/2018 Cycle 1 FOLFIRINOX with Onpro Neulasta.  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 10/06/2018 cycle 6  mFOLFIRINOX with Onpro Neulasta - [50% dose for Irinotecan- 5-FU bolus omitted]. 10/20/2018 cycle 7 mFOLFIRINOX with Onpro Neulasta - [50% dose for Irinotecan- 5-FU bolus omitted].  # 04/02/2020 [facility] CT chest abdomen pelvis  Postsurgical changes from Whipple procedure. Minimally increased dilation of the main pancreatic duct, which is nonspecific. Overall stable appearance of the pancreaticojejunostomy. Recommend attention on follow-up.  Decreased, previously prominent right axillary lymph node. No definite findings of new or increased metastatic disease in the chest, abdomen, or pelvis.    INTERVAL HISTORY Lynn Taylor is a 74 y.o. female with  history of stage IIb pancreatic cancer presents to for follow-up.   Patient is doing well clinically.  She follows up with [facility] GI clinic, every 3 months CT scan done at [facility] for the first 2 years..  She was last seen on 07/04/2024 and had 07/04/2020 CT done at [facility]-stable appearance status post Whipple.  No evidence of local recurrence or metastatic disease. She is about 30 months since her Whipple surgery. Denies any unintentional weight loss, fever, chills, diarrhea, abdominal pain.  Chronic neuropathy, on gabapentin.  Review of Systems  Constitutional: Negative for appetite change, chills, fatigue, fever and unexpected weight change.  HENT:   Negative for hearing loss and voice change.   Eyes: Negative for eye problems.  Respiratory: Negative for chest tightness, cough and shortness of breath.   Cardiovascular: Negative for chest pain and leg swelling.  Gastrointestinal: Negative for abdominal distention, abdominal pain, blood in stool and diarrhea.  Endocrine: Negative for hot flashes.  Genitourinary: Negative for difficulty urinating and frequency.   Musculoskeletal: Negative for arthralgias.  Skin: Negative for itching and rash.  Neurological: Positive for numbness. Negative for extremity weakness.  Hematological: Negative for adenopathy.  Psychiatric/Behavioral: Negative for confusion.      No Known Allergies   Past Medical History:  Diagnosis Date  . Anemia due to antineoplastic chemotherapy 11/25/2018  . Cancer ([place])   . GERD (gastroesophageal reflux disease)   . Hypertension   . Hypomagnesemia 11/25/2018  . Hypothyroidism   . Malignant neoplasm of head of pancreas ([place]) 06/08/2018  . Personal history of chemotherapy 2019    Pancreatic cancer     Past Surgical History:  Procedure Laterality Date  . CHOLECYSTECTOMY    . COLONOSCOPY N/A 12/02/2016   Procedure: COLONOSCOPY;  Surgeon: Lollie Sails, MD;  Location: [facility] ENDOSCOPY;  Service: Endoscopy;   Laterality: N/A;  . ERCP N/A 04/30/2018   Procedure: ENDOSCOPIC RETROGRADE CHOLANGIOPANCREATOGRAPHY (ERCP);  Surgeon: Lucilla Lame, MD;  Location: [facility] ENDOSCOPY;  Service: Endoscopy;  Laterality: N/A;  . ERCP N/A 05/05/2018   Procedure: ENDOSCOPIC RETROGRADE CHOLANGIOPANCREATOGRAPHY (ERCP);  Surgeon: Lucilla Lame, MD;  Location: [facility] ENDOSCOPY;  Service: Endoscopy;  Laterality: N/A;  . PORTA CATH INSERTION N/A 06/28/2018   Procedure: PORTA CATH INSERTION;  Surgeon: Algernon Huxley, MD;  Location: [facility] CV LAB;  Service: Cardiovascular;  Laterality: N/A;    Social History   Socioeconomic History  . Marital status: Married    Spouse name: Not on file  . Number of children: Not on file  . Years of education: Not on file  . Highest education level: Not on file  Occupational History  . Not on file  Tobacco Use  . Smoking status: Former Smoker    Quit date: 04/14/1988    Years since quitting: 32.5  . Smokeless tobacco: Never Used  Vaping Use  . Vaping Use: Never used  Substance and Sexual Activity  . Alcohol use: Yes    Comment: social drinker  . Drug use: No  . Sexual activity: Not Currently  Other Topics Concern  . Not on file  Social History Narrative   Lives at home married    In order of [place] star   Social Determinants of Health   Financial Resource Strain: Low Risk   . Difficulty of Paying Living Expenses: Not hard at all  Food Insecurity: No Food Insecurity  . Worried About Charity fundraiser in the Last Year: Never true  . Ran Out of Food in the Last Year: Never true  Transportation Needs: No Transportation Needs  . Lack of Transportation (Medical): No  . Lack of Transportation (  Non-Medical): No  Physical Activity: Not on file  Stress: No Stress Concern Present  . Feeling of Stress : Not at all  Social Connections: Unknown  . Frequency of Communication with Friends and Family: Not on file  . Frequency of Social Gatherings with Friends and Family: Not on  file  . Attends Religious Services: Not on file  . Active Member of Clubs or Organizations: Not on file  . Attends Archivist Meetings: Not on file  . Marital Status: Married  Human resources officer Violence: Not At Risk  . Fear of Current or Ex-Partner: No  . Emotionally Abused: No  . Physically Abused: No  . Sexually Abused: No    Family History  Adopted: Yes  Problem Relation Age of Onset  . Diabetes Mellitus I Other   . Alcoholism Other   . Hypertension Other   . Hyperlipidemia Other   . Coronary artery disease Other   . Stroke Other   . Osteoarthritis Other   . Migraines Other   . Heart Problems Mother   . Stroke Sister   . CAD Brother   . Stroke Brother   . Breast cancer Neg Hx      Current Outpatient Medications:  .  acetaminophen-codeine (TYLENOL #3) 300-30 MG tablet, Take 1 tablet by mouth every 4 (four) hours as needed for moderate pain., Disp: 20 tablet, Rfl: 0 .  ascorbic acid (VITAMIN C) 1000 MG tablet, Take by mouth., Disp: , Rfl:  .  aspirin (ASPIRIN 81) 81 MG EC tablet, Take 2 tablets (162 mg total) by mouth daily. Swallow whole., Disp: 180 tablet, Rfl: 1 .  Cholecalciferol (VITAMIN D-3 PO), Take 50 mcg by mouth daily. , Disp: , Rfl:  .  diclofenac Sodium (VOLTAREN) 1 % GEL, Apply topically 4 (four) times daily., Disp: , Rfl:  .  fluticasone (FLONASE) 50 MCG/ACT nasal spray, Place 2 sprays into both nostrils daily. After nasal saline, Disp: 16 g, Rfl: 2 .  gabapentin (NEURONTIN) 400 MG capsule, TAKE 1 CAPSULE BY MOUTH 3  TIMES DAILY, Disp: 270 capsule, Rfl: 3 .  hydrochlorothiazide (HYDRODIURIL) 12.5 MG tablet, TAKE 1 TABLET BY MOUTH  DAILY IN THE MORNING, Disp: 90 tablet, Rfl: 3 .  hydrocortisone 2.5 % ointment, Apply topically 2 (two) times daily. Prn, Disp: 30 g, Rfl: 0 .  levothyroxine (SYNTHROID) 100 MCG tablet, Take 1 tablet (100 mcg total) by mouth daily before breakfast., Disp: 90 tablet, Rfl: 3 .  Magnesium Cl-Calcium Carbonate (SLOW-MAG PO),  Take by mouth. 2 QD, Disp: , Rfl:  .  Multiple Vitamins-Minerals (CENTRUM SILVER 50+WOMEN PO), Take by mouth., Disp: , Rfl:  .  potassium chloride (KLOR-CON) 10 MEQ tablet, TAKE 1 TABLET BY MOUTH  DAILY, Disp: 90 tablet, Rfl: 3 .  Simethicone 125 MG CAPS, Take by mouth., Disp: , Rfl:  .  sodium chloride (OCEAN) 0.65 % SOLN nasal spray, Place 2 sprays into both nostrils daily as needed for congestion., Disp: 30 mL, Rfl: 2 .  TURMERIC CURCUMIN PO, Take by mouth 3 (three) times daily with meals., Disp: , Rfl:  .  ondansetron (ZOFRAN) 4 MG tablet, TAKE 1 TABLET BY MOUTH  EVERY 6 HOURS AS NEEDED FOR NAUSEA OR VOMITING. (Patient not taking: Reported on 10/26/2020), Disp: 180 tablet, Rfl: 1  Physical exam: ECOG 2 Vitals:   10/26/20 1038  BP: 116/75  Pulse: 80  Resp: 18  Temp: (!) 97.3 F (36.3 C)  Weight: 159 lb 9.6 oz (72.4 kg)   Physical  Exam Constitutional:      General: She is not in acute distress. HENT:     Head: Normocephalic and atraumatic.  Eyes:     General: No scleral icterus.    Pupils: Pupils are equal, round, and reactive to light.  Cardiovascular:     Rate and Rhythm: Normal rate and regular rhythm.     Heart sounds: Normal heart sounds.  Pulmonary:     Effort: Pulmonary effort is normal. No respiratory distress.     Breath sounds: No wheezing.  Abdominal:     General: Bowel sounds are normal. There is no distension.     Palpations: Abdomen is soft. There is no mass.     Tenderness: There is no abdominal tenderness.  Musculoskeletal:        General: No swelling or deformity. Normal range of motion.     Cervical back: Normal range of motion and neck supple.  Skin:    General: Skin is warm and dry.     Findings: No erythema or rash.  Neurological:     Mental Status: She is alert and oriented to person, place, and time. Mental status is at baseline.     Cranial Nerves: No cranial nerve deficit.     Coordination: Coordination normal.  Psychiatric:        Mood and  Affect: Mood normal.     CMP Latest Ref Rng & Units 10/26/2020  Glucose 70 - 99 mg/dL 173(H)  BUN 8 - 23 mg/dL 13  Creatinine 0.44 - 1.00 mg/dL 0.86  Sodium 135 - 145 mmol/L 133(L)  Potassium 3.5 - 5.1 mmol/L 3.7  Chloride 98 - 111 mmol/L 101  CO2 22 - 32 mmol/L 24  Calcium 8.9 - 10.3 mg/dL 9.2  Total Protein 6.5 - 8.1 g/dL 7.1  Total Bilirubin 0.3 - 1.2 mg/dL 0.8  Alkaline Phos 38 - 126 U/L 68  AST 15 - 41 U/L 18  ALT 0 - 44 U/L 16   CBC Latest Ref Rng & Units 10/26/2020  WBC 4.0 - 10.5 K/uL 8.4  Hemoglobin 12.0 - 15.0 g/dL 11.0(L)  Hematocrit 36.0 - 46.0 % 33.2(L)  Platelets 150 - 400 K/uL 196   RADIOGRAPHIC STUDIES: I have personally reviewed the radiological images as listed and agreed with the findings in the report.  No results found.   Assessment and plan Patient is a 75 y.o. female history of stage IIb pancreatic cancer present for discussion of image results and management of newly diagnosed bilateral lower extremity DVT. 1. Malignant neoplasm of head of pancreas ([place])   2. History of deep vein thrombosis (DVT) of lower extremity   3. Neuropathy   4. Elevated tumor markers    #Stage IIB pancreatic cancer status post Whipple resection [05/25/18]. Status post 7 out of the 12 planned cycles of mFOLFIRINOX with difficulties and have decided not to proceed with additional chemo. Recent CT scan was done at [facility]. I reviewed the results. No recurrence.  CA19.9 level has increased to 45 today. Obtain CT chest abdomen pelvis with contrast  #History of bilateral lower extremity DVT, no definitve provoking events.  Likely due to immobility during her chemotherapy treatments. Repeat US lower extremity showed near completion of DVT with minimal mild chronic DVT.  Continue Aspirin 162mg  daily as prophylaxis.   #Chemotherapy-induced neuropathy, continue gabapentin 400 mg 3 times daily.  Surveillance plan is follow-up in 6 months.  However CA 19-9 has increased.  Will obtain CT  chest abdomen pelvis with contrast  for further evaluation.  Further management pending on CT scanning.  Earlie Server, MD, PhD Hematology Oncology [place] at [facility] 10/27/2020

## 2020-10-30 ENCOUNTER — Telehealth: Payer: Self-pay

## 2020-10-30 NOTE — Telephone Encounter (Signed)
Patient has been communicating via MyChart message.  Will send a message informing patient of MD recommendation.

## 2020-10-30 NOTE — Telephone Encounter (Signed)
Good morning Ms. Sparlin, I wanted to give you an update.  Dr. Tasia Catchings has spoken to the [facility] clinic about your rising tumor marker.  She would like for you to get a CT scan and [facility] will be calling you to arrange the CT with their clinic.  If you do not hear from them by the end of this week, please give [facility] a call.  Let us know if you have any further questions.

## 2020-10-30 NOTE — Telephone Encounter (Signed)
Patient read and confirmed receipt of MyChart message

## 2020-10-30 NOTE — Telephone Encounter (Signed)
-----   Message from Earlie Server, MD sent at 10/29/2020 10:59 PM EDT ----- Please let her know that tumor marker is now elevated. I recommend her to get a CT scan. I talked to [facility] [place] clinic and they will call her to arrange CT to be done there. If she does not get a call by the end of this week, please advise her to call [place] clinic to follow up  thanks

## 2020-11-01 ENCOUNTER — Encounter: Payer: Self-pay | Admitting: Oncology

## 2020-11-01 ENCOUNTER — Ambulatory Visit: Payer: Medicare Other | Admitting: Oncology

## 2020-11-01 ENCOUNTER — Other Ambulatory Visit: Payer: Medicare Other

## 2020-11-06 ENCOUNTER — Encounter: Payer: Self-pay | Admitting: Internal Medicine

## 2020-11-12 DIAGNOSIS — Z9049 Acquired absence of other specified parts of digestive tract: Secondary | ICD-10-CM | POA: Diagnosis not present

## 2020-11-12 DIAGNOSIS — C250 Malignant neoplasm of head of pancreas: Secondary | ICD-10-CM | POA: Diagnosis not present

## 2020-11-12 DIAGNOSIS — C259 Malignant neoplasm of pancreas, unspecified: Secondary | ICD-10-CM | POA: Diagnosis not present

## 2021-01-04 ENCOUNTER — Inpatient Hospital Stay: Payer: Medicare Other | Attending: Oncology

## 2021-01-04 DIAGNOSIS — Z86718 Personal history of other venous thrombosis and embolism: Secondary | ICD-10-CM

## 2021-01-04 DIAGNOSIS — R978 Other abnormal tumor markers: Secondary | ICD-10-CM | POA: Diagnosis not present

## 2021-01-04 DIAGNOSIS — C250 Malignant neoplasm of head of pancreas: Secondary | ICD-10-CM | POA: Insufficient documentation

## 2021-01-04 DIAGNOSIS — Z95828 Presence of other vascular implants and grafts: Secondary | ICD-10-CM

## 2021-01-04 LAB — COMPREHENSIVE METABOLIC PANEL
ALT: 16 U/L (ref 0–44)
AST: 23 U/L (ref 15–41)
Albumin: 4 g/dL (ref 3.5–5.0)
Alkaline Phosphatase: 77 U/L (ref 38–126)
Anion gap: 9 (ref 5–15)
BUN: 17 mg/dL (ref 8–23)
CO2: 23 mmol/L (ref 22–32)
Calcium: 9.2 mg/dL (ref 8.9–10.3)
Chloride: 102 mmol/L (ref 98–111)
Creatinine, Ser: 0.78 mg/dL (ref 0.44–1.00)
GFR, Estimated: >60 mL/min (ref >60)
Glucose, Bld: 175 mg/dL — ABNORMAL HIGH (ref 70–99)
Potassium: 3.7 mmol/L (ref 3.5–5.1)
Sodium: 134 mmol/L — ABNORMAL LOW (ref 135–145)
Total Bilirubin: 0.3 mg/dL (ref 0.3–1.2)
Total Protein: 7.1 g/dL (ref 6.5–8.1)

## 2021-01-04 LAB — CBC WITH DIFFERENTIAL/PLATELET
Abs Immature Granulocytes: 0.03 10*3/uL (ref 0.00–0.07)
Basophils Absolute: 0 10*3/uL (ref 0.0–0.1)
Basophils Relative: 0 %
Eosinophils Absolute: 0.1 10*3/uL (ref 0.0–0.5)
Eosinophils Relative: 1 %
HCT: 33.8 % — ABNORMAL LOW (ref 36.0–46.0)
Hemoglobin: 10.8 g/dL — ABNORMAL LOW (ref 12.0–15.0)
Immature Granulocytes: 0 %
Lymphocytes Relative: 28 %
Lymphs Abs: 2 10*3/uL (ref 0.7–4.0)
MCH: 28.3 pg (ref 26.0–34.0)
MCHC: 32 g/dL (ref 30.0–36.0)
MCV: 88.7 fL (ref 80.0–100.0)
Monocytes Absolute: 0.4 10*3/uL (ref 0.1–1.0)
Monocytes Relative: 5 %
Neutro Abs: 4.7 10*3/uL (ref 1.7–7.7)
Neutrophils Relative %: 66 %
Platelets: 177 10*3/uL (ref 150–400)
RBC: 3.81 MIL/uL — ABNORMAL LOW (ref 3.87–5.11)
RDW: 14.5 % (ref 11.5–15.5)
WBC: 7.2 10*3/uL (ref 4.0–10.5)
nRBC: 0 % (ref 0.0–0.2)

## 2021-01-04 MED ORDER — HEPARIN SOD (PORK) LOCK FLUSH 100 UNIT/ML IV SOLN
INTRAVENOUS | Status: AC
  Filled 2021-01-04: qty 5

## 2021-01-04 MED ORDER — HEPARIN SOD (PORK) LOCK FLUSH 100 UNIT/ML IV SOLN
500.0000 [IU] | Freq: Once | INTRAVENOUS | Status: AC
  Administered 2021-01-04: 500 [IU] via INTRAVENOUS
  Filled 2021-01-04: qty 5

## 2021-01-04 MED ORDER — SODIUM CHLORIDE 0.9% FLUSH
10.0000 mL | Freq: Once | INTRAVENOUS | Status: AC
  Administered 2021-01-04: 10 mL via INTRAVENOUS
  Filled 2021-01-04: qty 10

## 2021-01-04 NOTE — Patient Instructions (Signed)
[facility] ONCOL[facility]  Discharge Instructions: Thank you for choosing [facility] to provide your oncology and hematology care.  If you have a lab appointment with the [facility], please go directly to the [facility] and check in at the registration area.  Wear comfortable clothing and clothing appropriate for easy access to any Portacath or PICC line.   We strive to give you quality time with your provider. You may need to reschedule your appointment if you arrive late (15 or more minutes).  Arriving late affects you and other patients whose appointments are after yours.  Also, if you miss three or more appointments without notifying the office, you may be dismissed from the clinic at the provider's discretion.      For prescription refill requests, have your pharmacy contact our office and allow 72 hours for refills to be completed.    Today you received the following chemotherapy and/or immunotherapy agents port a cath flush       To help prevent nausea and vomiting after your treatment, we encourage you to take your nausea medication as directed.  BELOW ARE SYMPTOMS THAT SHOULD BE REPORTED IMMEDIATELY: *FEVER GREATER THAN 100.4 F (38 C) OR HIGHER *CHILLS OR SWEATING *NAUSEA AND VOMITING THAT IS NOT CONTROLLED WITH YOUR NAUSEA MEDICATION *UNUSUAL SHORTNESS OF BREATH *UNUSUAL BRUISING OR BLEEDING *URINARY PROBLEMS (pain or burning when urinating, or frequent urination) *BOWEL PROBLEMS (unusual diarrhea, constipation, pain near the anus) TENDERNESS IN MOUTH AND THROAT WITH OR WITHOUT PRESENCE OF ULCERS (sore throat, sores in mouth, or a toothache) UNUSUAL RASH, SWELLING OR PAIN  UNUSUAL VAGINAL DISCHARGE OR ITCHING   Items with * indicate a potential emergency and should be followed up as soon as possible or go to the Emergency Department if any problems should occur.  Please show the CHEMOTHERAPY ALERT CARD or IMMUNOTHERAPY ALERT CARD at  check-in to the Emergency Department and triage nurse.  Should you have questions after your visit or need to cancel or reschedule your appointment, please contact [facility]  [phone] and follow the prompts.  Office hours are 8:00 a.m. to 4:30 p.m. Monday - Friday. Please note that voicemails left after 4:00 p.m. may not be returned until the following business day.  We are closed weekends and major holidays. You have access to a nurse at all times for urgent questions. Please call the main number to the clinic [phone] and follow the prompts.  For any non-urgent questions, you may also contact your provider using MyChart. We now offer e-Visits for anyone 65 and older to request care online for non-urgent symptoms. For details visit [email or web address].   Also download the MyChart app! Go to the app store, search "MyChart", open the app, select [facility], and log in with your MyChart username and password.  Due to Covid, a mask is required upon entering the hospital/clinic. If you do not have a mask, one will be given to you upon arrival. For doctor visits, patients may have 1 support person aged 8 or older with them. For treatment visits, patients cannot have anyone with them due to current Covid guidelines and our immunocompromised population.

## 2021-01-05 LAB — CANCER ANTIGEN 19-9: CA 19-9: 33 U/mL (ref 0–35)

## 2021-01-07 ENCOUNTER — Telehealth: Payer: Self-pay

## 2021-01-07 NOTE — Telephone Encounter (Signed)
-----   Message from Earlie Server, MD sent at 01/06/2021 11:56 AM EDT ----- Repeat labs showed normalized tumor mark and her CT at [facility] was good.  Please arrange her to follow up in December, cbc cmp CA 19.9. thanks.

## 2021-01-07 NOTE — Telephone Encounter (Signed)
Patient informed of MD recommendations.   Please r/s apps in October to December and notify pt of new appts. Pt scheduled for port flush on 9/23, will need another one scheduled 8 weeks after that.

## 2021-01-17 ENCOUNTER — Encounter: Payer: Self-pay | Admitting: Internal Medicine

## 2021-01-26 ENCOUNTER — Encounter: Payer: Self-pay | Admitting: Internal Medicine

## 2021-02-05 ENCOUNTER — Ambulatory Visit (INDEPENDENT_AMBULATORY_CARE_PROVIDER_SITE_OTHER): Payer: Medicare Other

## 2021-02-05 ENCOUNTER — Other Ambulatory Visit: Payer: Self-pay

## 2021-02-05 VITALS — BP 125/76 | Resp 15 | Ht 62.99 in | Wt 155.0 lb

## 2021-02-05 DIAGNOSIS — Z0000 Encounter for general adult medical examination without abnormal findings: Secondary | ICD-10-CM | POA: Diagnosis not present

## 2021-02-05 NOTE — Progress Notes (Signed)
Subjective:    Subjective   Lynn Taylor is a 75 y.o. female who presents for Medicare Annual (Subsequent) preventive examination.   Review of Systems    No ROS.  Medicare Wellness   Cardiac Risk Factors include: advanced age (>36mn, >>45women);hypertension       Objective:    Objective       Today's Vitals    02/05/21 0959  BP: 125/76  Resp: 15  SpO2: 100%  Weight: 155 lb (70.3 kg)  Height: 5' 2.99" (1.6 m)    Body mass index is 27.47 kg/m.   Advanced Directives 02/05/2021 10/26/2020 04/27/2020 02/03/2020 11/02/2019 02/23/2019 02/02/2019  Does Patient Have a Medical Advance Directive? No No No No No No No  Would patient like information on creating a medical advance directive? No - Patient declined - No - Patient declined No - Patient declined No - Patient declined No - Patient declined No - Patient declined      Current Medications (verified)     Outpatient Encounter Medications as of 02/05/2021  Medication Sig   acetaminophen-codeine (TYLENOL #3) 300-30 MG tablet Take 1 tablet by mouth every 4 (four) hours as needed for moderate pain.   ascorbic acid (VITAMIN C) 1000 MG tablet Take by mouth.   aspirin (ASPIRIN 81) 81 MG EC tablet Take 2 tablets (162 mg total) by mouth daily. Swallow whole.   Cholecalciferol (VITAMIN D-3 PO) Take 50 mcg by mouth daily.    diclofenac Sodium (VOLTAREN) 1 % GEL Apply topically 4 (four) times daily.   fluticasone (FLONASE) 50 MCG/ACT nasal spray Place 2 sprays into both nostrils daily. After nasal saline   gabapentin (NEURONTIN) 400 MG capsule TAKE 1 CAPSULE BY MOUTH 3  TIMES DAILY   hydrochlorothiazide (HYDRODIURIL) 12.5 MG tablet TAKE 1 TABLET BY MOUTH  DAILY IN THE MORNING   hydrocortisone 2.5 % ointment Apply topically 2 (two) times daily. Prn   levothyroxine (SYNTHROID) 100 MCG tablet Take 1 tablet (100 mcg total) by mouth daily before breakfast.   Magnesium Cl-Calcium Carbonate (SLOW-MAG PO) Take by mouth. 2 QD   Multiple  Vitamins-Minerals (CENTRUM SILVER 50+WOMEN PO) Take by mouth.   ondansetron (ZOFRAN) 4 MG tablet TAKE 1 TABLET BY MOUTH  EVERY 6 HOURS AS NEEDED FOR NAUSEA OR VOMITING. (Patient not taking: Reported on 10/26/2020)   potassium chloride (KLOR-CON) 10 MEQ tablet TAKE 1 TABLET BY MOUTH  DAILY   Simethicone 125 MG CAPS Take by mouth.   sodium chloride (OCEAN) 0.65 % SOLN nasal spray Place 2 sprays into both nostrils daily as needed for congestion.   TURMERIC CURCUMIN PO Take by mouth 3 (three) times daily with meals.    No facility-administered encounter medications on file as of 02/05/2021.      Allergies (verified) Patient has no known allergies.    History:     Past Medical History:  Diagnosis Date   Anemia due to antineoplastic chemotherapy 11/25/2018   Cancer ([facility]     GERD (gastroesophageal reflux disease)     Hypertension     Hypomagnesemia 11/25/2018   Hypothyroidism     [place]   Personal history of chemotherapy 2019     Pancreatic cancer         Past Surgical History:  Procedure Laterality Date   CHOLECYSTECTOMY       COLONOSCOPY N/A 12/02/2016    Procedure: COLONOSCOPY;  Surgeon: SLollie Sails MD;  Location: [facility] ENDOSCOPY;  Service: Endoscopy;  Laterality: N/A;   ERCP N/A 04/30/2018    Procedure: ENDOSCOPIC RETROGRADE CHOLANGIOPANCREATOGRAPHY (ERCP);  Surgeon: Lucilla Lame, MD;  Location: [facility] ENDOSCOPY;  Service: Endoscopy;  Laterality: N/A;   ERCP N/A 05/05/2018    Procedure: ENDOSCOPIC RETROGRADE CHOLANGIOPANCREATOGRAPHY (ERCP);  Surgeon: Lucilla Lame, MD;  Location: [facility] ENDOSCOPY;  Service: Endoscopy;  Laterality: N/A;   PORTA CATH INSERTION N/A 06/28/2018    Procedure: PORTA CATH INSERTION;  Surgeon: Algernon Huxley, MD;  Location: [place] [facility] LAB;  Service: Cardiovascular;  Laterality: N/A;         Family History  Adopted: Yes  Problem Relation Age of Onset   Diabetes Mellitus I Other     Alcoholism Other      Hypertension Other     Hyperlipidemia Other     Coronary artery disease Other     Stroke Other     Osteoarthritis Other     Migraines Other     Heart Problems Mother     Stroke Sister     CAD Brother     Stroke Brother     Breast cancer Neg Hx      Social History         Socioeconomic History   Marital status: Married      Spouse name: Not on file   Number of children: Not on file   Years of education: Not on file   Highest education level: Not on file  Occupational History   Not on file  Tobacco Use   Smoking status: Former      Types: Cigarettes      Quit date: 04/14/1988      Years since quitting: 32.8   Smokeless tobacco: Never  Vaping Use   Vaping Use: Never used  Substance and Sexual Activity   Alcohol use: Yes      Comment: social drinker   Drug use: No   Sexual activity: Not Currently  Other Topics Concern   Not on file  Social History Narrative    Lives at home married     In order of [place] star    Social Determinants of Health       Financial Resource Strain: Low Risk    Difficulty of Paying Living Expenses: Not hard at all  Food Insecurity: No Food Insecurity   Worried About Charity fundraiser in the Last Year: Never true   Arboriculturist in the Last Year: Never true  Transportation Needs: No Transportation Needs   Lack of Transportation (Medical): No   Lack of Transportation (Non-Medical): No  Physical Activity: Sufficiently Active   Days of Exercise per Week: 5 days   Minutes of Exercise per Session: 30 min  Stress: No Stress Concern Present   Feeling of Stress : Not at all  Social Connections: Unknown   Frequency of Communication with Friends and Family: Not on file   Frequency of Social Gatherings with Friends and Family: Not on file   Attends Religious Services: Not on Electrical engineer or Organizations: Not on file   Attends Archivist Meetings: Not on file   Marital Status: Married      Tobacco  Counseling Counseling given: Not Answered     Clinical Intake:   Pre-visit preparation completed: Yes       Diabetes: No   How often do you need to have someone help you when you read instructions, pamphlets, or other written materials  from your doctor or pharmacy?: 1 - Never   Interpreter Needed?: No       Activities of Daily Living In your present state of health, do you have any difficulty performing the following activities: 02/05/2021  Hearing? N  Vision? N  Difficulty concentrating or making decisions? N  Walking or climbing stairs? N  Dressing or bathing? N  Doing errands, shopping? N  Preparing Food and eating ? N  Using the Toilet? N  In the past six months, have you accidently leaked urine? N  Do you have problems with loss of bowel control? N  Managing your Medications? N  Managing your Finances? N  Housekeeping or managing your Housekeeping? N  Some recent data might be hidden      Patient Care Team: McLean-Scocuzza, Nino Glow, MD as PCP - General (Internal Medicine) Clent Jacks, RN as Registered Nurse Jonathon Bellows, MD as Consulting Physician (Gastroenterology) Clent Jacks, RN as Oncology Nurse Navigator Earlie Server, MD as Consulting Physician (Oncology) Fayrene Helper Stacie Acres, MD as Referring Physician (Oncology) Lucky Cowboy Erskine Squibb, MD as Referring Physician (Vascular Surgery)   Indicate any recent Medical Services you may have received from other than [facility] providers in the past year (date may be approximate).       Assessment:    Assessment  This is a routine wellness examination for [facility].   Hearing/Vision screen Hearing Screening - Comments:: Patient is able to hear conversational tones without difficulty.  No issues reported. Vision Screening - Comments:: Wears corrective lenses They have seen their ophthalmologist in the last 12 months.     Dietary issues and exercise activities discussed:  Healthy diet Good fluid intake     Goals  Addressed                           This Visit's Progress             Patient Stated      Follow up with Provider as scheduled (pt-stated)              Stay hydrated           Depression Screen PHQ 2/9 Scores 02/05/2021 02/03/2020 01/26/2020 11/17/2019 09/21/2019 04/29/2019 02/02/2019  PHQ - 2 Score 0 0 0 0 0 0 0  PHQ- 9 Score - - - - - - -    Fall Risk Fall Risk  02/05/2021 09/12/2020 03/22/2020 02/03/2020 11/17/2019  Falls in the past year? 0 0 0 0 0  Number falls in past yr: 0 0 0 0 -  Injury with Fall? - 0 0 - -  Follow up Falls evaluation completed Falls evaluation completed Falls evaluation completed Falls evaluation completed -      FALL RISK PREVENTION PERTAINING TO THE HOME: Adequate lighting in your home to reduce risk of falls? Yes    ASSISTIVE DEVICES UTILIZED TO PREVENT FALLS: Use of a cane, walker or w/c? No    TIMED UP AND GO: Was the test performed? No .    Cognitive Function:  Patient is alert and oriented x3.   Immunizations     Immunization History  Administered Date(s) Administered   Fluad Quad(high Dose 65+) 05/18/2020   PFIZER(Purple Top)SARS-COV-2 Vaccination 07/21/2019, 08/11/2019, 03/12/2020, 09/14/2020    Shingles vaccine- Due, Education has been provided regarding the importance of this vaccine. Advised may receive this vaccine at local pharmacy or Health Dept. Aware to provide a copy of the vaccination  record if obtained from local pharmacy or Health Dept. Verbalized acceptance and understanding. Deferred.    Health Maintenance     Health Maintenance  Topic Date Due   COVID-19 Vaccine (5 - Booster for [place] series) 02/21/2021 (Originally 01/14/2021)   Zoster Vaccines- Shingrix (1 of 2) 05/08/2021 (Originally 02/24/1965)   MAMMOGRAM  07/26/2022   COLONOSCOPY (Pts 45-64yr Insurance coverage will need to be confirmed)  12/03/2026   DEXA SCAN  Completed   Hepatitis C Screening  Completed   HPV VACCINES  Aged Out   INFLUENZA VACCINE  Discontinued    TETANUS/TDAP  Discontinued   PNA vac Low Risk Adult  Discontinued    Lung Cancer Screening: (Low Dose CT Chest recommended if Age 75-80years, 30 pack-year currently smoking OR have quit w/in 15years.) does not qualify.    Vision Screening: Recommended annual ophthalmology exams for early detection of glaucoma and other disorders of the eye.   Dental Screening: Recommended annual dental exams for proper oral hygiene   Community Resource Referral / Chronic Care Management: CRR required this visit?  No    CCM required this visit?  No          Plan:    Plan  Keep all routine maintenance appointments.    I have personally reviewed and noted the following in the patient's chart:    Medical and social history Use of alcohol, tobacco or illicit drugs  Current medications and supplements including opioid prescriptions. Not taking opioids.  Functional ability and status Nutritional status Physical activity Advanced directives List of other physicians Hospitalizations, surgeries, and ER visits in previous 12 months Vitals Screenings to include cognitive, depression, and falls Referrals and appointments   In addition, I have reviewed and discussed with patient certain preventive protocols, quality metrics, and best practice recommendations. A written personalized care plan for preventive services as well as general preventive health recommendations were provided to patient via mychart.         OVarney Biles LPN                                   02/05/2021

## 2021-02-05 NOTE — Progress Notes (Deleted)
Subjective:   Lynn Taylor is a 75 y.o. female who presents for Medicare Annual (Subsequent) preventive examination.  Review of Systems    No ROS.  Medicare Wellness   Cardiac Risk Factors include: advanced age (>15mn, >>70women);hypertension     Objective:    Today's Vitals   02/05/21 0959  BP: 125/76  Resp: 15  SpO2: 100%  Weight: 155 lb (70.3 kg)  Height: 5' 2.99" (1.6 m)   Body mass index is 27.47 kg/m.  Advanced Directives 02/05/2021 10/26/2020 04/27/2020 02/03/2020 11/02/2019 02/23/2019 02/02/2019  Does Patient Have a Medical Advance Directive? No No No No No No No  Would patient like information on creating a medical advance directive? No - Patient declined - No - Patient declined No - Patient declined No - Patient declined No - Patient declined No - Patient declined    Current Medications (verified) Outpatient Encounter Medications as of 02/05/2021  Medication Sig   acetaminophen-codeine (TYLENOL #3) 300-30 MG tablet Take 1 tablet by mouth every 4 (four) hours as needed for moderate pain.   ascorbic acid (VITAMIN C) 1000 MG tablet Take by mouth.   aspirin (ASPIRIN 81) 81 MG EC tablet Take 2 tablets (162 mg total) by mouth daily. Swallow whole.   Cholecalciferol (VITAMIN D-3 PO) Take 50 mcg by mouth daily.    diclofenac Sodium (VOLTAREN) 1 % GEL Apply topically 4 (four) times daily.   fluticasone (FLONASE) 50 MCG/ACT nasal spray Place 2 sprays into both nostrils daily. After nasal saline   gabapentin (NEURONTIN) 400 MG capsule TAKE 1 CAPSULE BY MOUTH 3  TIMES DAILY   hydrochlorothiazide (HYDRODIURIL) 12.5 MG tablet TAKE 1 TABLET BY MOUTH  DAILY IN THE MORNING   hydrocortisone 2.5 % ointment Apply topically 2 (two) times daily. Prn   levothyroxine (SYNTHROID) 100 MCG tablet Take 1 tablet (100 mcg total) by mouth daily before breakfast.   Magnesium Cl-Calcium Carbonate (SLOW-MAG PO) Take by mouth. 2 QD   Multiple Vitamins-Minerals (CENTRUM SILVER 50+WOMEN PO) Take by  mouth.   ondansetron (ZOFRAN) 4 MG tablet TAKE 1 TABLET BY MOUTH  EVERY 6 HOURS AS NEEDED FOR NAUSEA OR VOMITING. (Patient not taking: Reported on 10/26/2020)   potassium chloride (KLOR-CON) 10 MEQ tablet TAKE 1 TABLET BY MOUTH  DAILY   Simethicone 125 MG CAPS Take by mouth.   sodium chloride (OCEAN) 0.65 % SOLN nasal spray Place 2 sprays into both nostrils daily as needed for congestion.   TURMERIC CURCUMIN PO Take by mouth 3 (three) times daily with meals.   No facility-administered encounter medications on file as of 02/05/2021.    Allergies (verified) Patient has no known allergies.   History: Past Medical History:  Diagnosis Date   Anemia due to antineoplastic chemotherapy 11/25/2018   Cancer ([facility]    GERD (gastroesophageal reflux disease)    Hypertension    Hypomagnesemia 11/25/2018   Hypothyroidism    [place]   Personal history of chemotherapy 2019    Pancreatic cancer   Past Surgical History:  Procedure Laterality Date   CHOLECYSTECTOMY     COLONOSCOPY N/A 12/02/2016   Procedure: COLONOSCOPY;  Surgeon: SLollie Sails MD;  [facility]  Service: Endoscopy;  Laterality: N/A;   ERCP N/A 04/30/2018   Procedure: ENDOSCOPIC RETROGRADE CHOLANGIOPANCREATOGRAPHY (ERCP);  Surgeon: WLucilla Lame MD;  [facility];  Service: Endoscopy;  Laterality: N/A;   ERCP N/A 05/05/2018   Procedure: ENDOSCOPIC RETROGRADE CHOLANGIOPANCREATOGRAPHY (ERCP);  Surgeon:  Lucilla Lame, MD;  Location: [place] ENDOSCOPY;  Service: Endoscopy;  Laterality: N/A;   PORTA CATH INSERTION N/A 06/28/2018   Procedure: PORTA CATH INSERTION;  Surgeon: Algernon Huxley, MD;  Location: [place] [facility] LAB;  Service: Cardiovascular;  Laterality: N/A;   Family History  Adopted: Yes  Problem Relation Age of Onset   Diabetes Mellitus I Other    Alcoholism Other    Hypertension Other    Hyperlipidemia Other    Coronary artery disease Other    Stroke  Other    Osteoarthritis Other    Migraines Other    Heart Problems Mother    Stroke Sister    CAD Brother    Stroke Brother    Breast cancer Neg Hx    Social History   Socioeconomic History   Marital status: Married    Spouse name: Not on file   Number of children: Not on file   Years of education: Not on file   Highest education level: Not on file  Occupational History   Not on file  Tobacco Use   Smoking status: Former    Types: Cigarettes    Quit date: 04/14/1988    Years since quitting: 32.8   Smokeless tobacco: Never  Vaping Use   Vaping Use: Never used  Substance and Sexual Activity   Alcohol use: Yes    Comment: social drinker   Drug use: No   Sexual activity: Not Currently  Other Topics Concern   Not on file  Social History Narrative   Lives at home married    In order of [place] star   Social Determinants of Health   Financial Resource Strain: Low Risk    Difficulty of Paying Living Expenses: Not hard at all  Food Insecurity: No Food Insecurity   Worried About Charity fundraiser in the Last Year: Never true   Arboriculturist in the Last Year: Never true  Transportation Needs: No Transportation Needs   Lack of Transportation (Medical): No   Lack of Transportation (Non-Medical): No  Physical Activity: Sufficiently Active   Days of Exercise per Week: 5 days   Minutes of Exercise per Session: 30 min  Stress: No Stress Concern Present   Feeling of Stress : Not at all  Social Connections: Unknown   Frequency of Communication with Friends and Family: Not on file   Frequency of Social Gatherings with Friends and Family: Not on file   Attends Religious Services: Not on Electrical engineer or Organizations: Not on file   Attends Archivist Meetings: Not on file   Marital Status: Married    Tobacco Counseling Counseling given: Not Answered   Clinical Intake:  Pre-visit preparation completed: Yes        Diabetes: No  How  often do you need to have someone help you when you read instructions, pamphlets, or other written materials from your doctor or pharmacy?: 1 - Never  Interpreter Needed?: No      Activities of Daily Living In your present state of health, do you have any difficulty performing the following activities: 02/05/2021  Hearing? N  Vision? N  Difficulty concentrating or making decisions? N  Walking or climbing stairs? N  Dressing or bathing? N  Doing errands, shopping? N  Preparing Food and eating ? N  Using the Toilet? N  In the past six months, have you accidently leaked urine? N  Do you have problems with loss  of bowel control? N  Managing your Medications? N  Managing your Finances? N  Housekeeping or managing your Housekeeping? N  Some recent data might be hidden    Patient Care Team: McLean-Scocuzza, Nino Glow, MD as PCP - General (Internal Medicine) Clent Jacks, RN as Registered Nurse Jonathon Bellows, MD as Consulting Physician (Gastroenterology) Clent Jacks, RN as Oncology Nurse Navigator Earlie Server, MD as Consulting Physician (Oncology) Fayrene Helper Stacie Acres, MD as Referring Physician (Oncology) Lucky Cowboy Erskine Squibb, MD as Referring Physician (Vascular Surgery)  Indicate any recent Medical Services you may have received from other than [facility] providers in the past year (date may be approximate).     Assessment:   This is a routine wellness examination for [facility].  Hearing/Vision screen Hearing Screening - Comments:: Patient is able to hear conversational tones without difficulty.  No issues reported. Vision Screening - Comments:: Wears corrective lenses They have seen their ophthalmologist in the last 12 months.    Dietary issues and exercise activities discussed:  Healthy diet Good fluid intake   Goals Addressed               This Visit's Progress     Patient Stated     Follow up with Provider as scheduled (pt-stated)        Stay hydrated       Depression  Screen PHQ 2/9 Scores 02/05/2021 02/03/2020 01/26/2020 11/17/2019 09/21/2019 04/29/2019 02/02/2019  PHQ - 2 Score 0 0 0 0 0 0 0  PHQ- 9 Score - - - - - - -    Fall Risk Fall Risk  02/05/2021 09/12/2020 03/22/2020 02/03/2020 11/17/2019  Falls in the past year? 0 0 0 0 0  Number falls in past yr: 0 0 0 0 -  Injury with Fall? - 0 0 - -  Follow up Falls evaluation completed Falls evaluation completed Falls evaluation completed Falls evaluation completed -    FALL RISK PREVENTION PERTAINING TO THE HOME: Adequate lighting in your home to reduce risk of falls? Yes   ASSISTIVE DEVICES UTILIZED TO PREVENT FALLS: Use of a cane, walker or w/c? No   TIMED UP AND GO: Was the test performed? No .   Cognitive Function:  Patient is alert and oriented x3.     Immunizations Immunization History  Administered Date(s) Administered   Fluad Quad(high Dose 65+) 05/18/2020   PFIZER(Purple Top)SARS-COV-2 Vaccination 07/21/2019, 08/11/2019, 03/12/2020, 09/14/2020   Shingles vaccine- Due, Education has been provided regarding the importance of this vaccine. Advised may receive this vaccine at local pharmacy or Health Dept. Aware to provide a copy of the vaccination record if obtained from local pharmacy or Health Dept. Verbalized acceptance and understanding. Deferred.   Health Maintenance Health Maintenance  Topic Date Due   COVID-19 Vaccine (5 - Booster for [place] series) 02/21/2021 (Originally 01/14/2021)   Zoster Vaccines- Shingrix (1 of 2) 05/08/2021 (Originally 02/24/1965)   MAMMOGRAM  07/26/2022   COLONOSCOPY (Pts 45-32yr Insurance coverage will need to be confirmed)  12/03/2026   DEXA SCAN  Completed   Hepatitis C Screening  Completed   HPV VACCINES  Aged Out   INFLUENZA VACCINE  Discontinued   TETANUS/TDAP  Discontinued   PNA vac Low Risk Adult  Discontinued   Lung Cancer Screening: (Low Dose CT Chest recommended if Age 75-80years, 30 pack-year currently smoking OR have quit w/in 15years.) does not  qualify.   Vision Screening: Recommended annual ophthalmology exams for early detection of glaucoma and other disorders  of the eye.  Dental Screening: Recommended annual dental exams for proper oral hygiene  Community Resource Referral / Chronic Care Management: CRR required this visit?  No   CCM required this visit?  No      Plan:   Keep all routine maintenance appointments.   I have personally reviewed and noted the following in the patient's chart:   Medical and social history Use of alcohol, tobacco or illicit drugs  Current medications and supplements including opioid prescriptions. Not taking opioids.  Functional ability and status Nutritional status Physical activity Advanced directives List of other physicians Hospitalizations, surgeries, and ER visits in previous 12 months Vitals Screenings to include cognitive, depression, and falls Referrals and appointments  In addition, I have reviewed and discussed with patient certain preventive protocols, quality metrics, and best practice recommendations. A written personalized care plan for preventive services as well as general preventive health recommendations were provided to patient via mychart.     Varney Biles, LPN   [id number]

## 2021-02-05 NOTE — Patient Instructions (Addendum)
Lynn Taylor , Thank you for taking time to come for your Medicare Wellness Visit. I appreciate your ongoing commitment to your health goals. Please review the following plan we discussed and let me know if I can assist you in the future.   These are the goals we discussed:  Goals       Patient Stated     Follow up with Provider as scheduled (pt-stated)      Stay hydrated        This is a list of the screening recommended for you and due dates:  Health Maintenance  Topic Date Due   COVID-19 Vaccine (5 - Booster for Pfizer series) 02/21/2021*   Zoster (Shingles) Vaccine (1 of 2) 05/08/2021*   Mammogram  07/26/2022   Colon Cancer Screening  12/03/2026   DEXA scan (bone density measurement)  Completed   Hepatitis C Screening: USPSTF Recommendation to screen - Ages 40-79 yo.  Completed   HPV Vaccine  Aged Out   Flu Shot  Discontinued   Tetanus Vaccine  Discontinued   Pneumonia vaccines  Discontinued  *Topic was postponed. The date shown is not the original due date.    Advanced directives: not yet completed  Next appointment: Follow up in one year for your annual wellness visit   [place] [phone] for patient suspected hammer toe. Follow up with pcp next office visit. Appointment noted.    Preventive Care 75 Years and Older, Female Preventive care refers to lifestyle choices and visits with your health care provider that can promote health and wellness. What does preventive care include? A yearly physical exam. This is also called an annual well check. Dental exams once or twice a year. Routine eye exams. Ask your health care provider how often you should have your eyes checked. Personal lifestyle choices, including: Daily care of your teeth and gums. Regular physical activity. Eating a healthy diet. Avoiding tobacco and drug use. Limiting alcohol use. Practicing safe sex. Taking low-dose aspirin every day. Taking vitamin and mineral supplements as  recommended by your health care provider. What happens during an annual well check? The services and screenings done by your health care provider during your annual well check will depend on your age, overall health, lifestyle risk factors, and family history of disease. Counseling  Your health care provider may ask you questions about your: Alcohol use. Tobacco use. Drug use. Emotional well-being. Home and relationship well-being. Sexual activity. Eating habits. History of falls. Memory and ability to understand (cognition). Work and work Statistician. Reproductive health. Screening  You may have the following tests or measurements: Height, weight, and BMI. Blood pressure. Lipid and cholesterol levels. These may be checked every 5 years, or more frequently if you are over 31 years old. Skin check. Lung cancer screening. You may have this screening every year starting at age 10 if you have a 30-pack-year history of smoking and currently smoke or have quit within the past 15 years. Fecal occult blood test (FOBT) of the stool. You may have this test every year starting at age 81. Flexible sigmoidoscopy or colonoscopy. You may have a sigmoidoscopy every 5 years or a colonoscopy every 10 years starting at age 16. Hepatitis C blood test. Hepatitis B blood test. Sexually transmitted disease (STD) testing. Diabetes screening. This is done by checking your blood sugar (glucose) after you have not eaten for a while (fasting). You may have this done every 1-3 years. Bone density scan. This is done to screen for  osteoporosis. You may have this done starting at age 33. Mammogram. This may be done every 1-2 years. Talk to your health care provider about how often you should have regular mammograms. Talk with your health care provider about your test results, treatment options, and if necessary, the need for more tests. Vaccines  Your health care provider may recommend certain vaccines, such  as: Influenza vaccine. This is recommended every year. Tetanus, diphtheria, and acellular pertussis (Tdap, Td) vaccine. You may need a Td booster every 10 years. Zoster vaccine. You may need this after age 54. Pneumococcal 13-valent conjugate (PCV13) vaccine. One dose is recommended after age 75. Pneumococcal polysaccharide (PPSV23) vaccine. One dose is recommended after age 68. Talk to your health care provider about which screenings and vaccines you need and how often you need them. This information is not intended to replace advice given to you by your health care provider. Make sure you discuss any questions you have with your health care provider. Document Released: 06/22/2015 Document Revised: 02/13/2016 Document Reviewed: 03/27/2015 Elsevier Interactive Patient Education  2017 [place] Prevention in the Home Falls can cause injuries. They can happen to people of all ages. There are many things you can do to make your home safe and to help prevent falls. What can I do on the outside of my home? Regularly fix the edges of walkways and driveways and fix any cracks. Remove anything that might make you trip as you walk through a door, such as a raised step or threshold. Trim any bushes or trees on the path to your home. Use bright outdoor lighting. Clear any walking paths of anything that might make someone trip, such as rocks or tools. Regularly check to see if handrails are loose or broken. Make sure that both sides of any steps have handrails. Any raised decks and porches should have guardrails on the edges. Have any leaves, snow, or ice cleared regularly. Use sand or salt on walking paths during winter. Clean up any spills in your garage right away. This includes oil or grease spills. What can I do in the bathroom? Use night lights. Install grab bars by the toilet and in the tub and shower. Do not use towel bars as grab bars. Use non-skid mats or decals in the tub or  shower. If you need to sit down in the shower, use a plastic, non-slip stool. Keep the floor dry. Clean up any water that spills on the floor as soon as it happens. Remove soap buildup in the tub or shower regularly. Attach bath mats securely with double-sided non-slip rug tape. Do not have throw rugs and other things on the floor that can make you trip. What can I do in the bedroom? Use night lights. Make sure that you have a light by your bed that is easy to reach. Do not use any sheets or blankets that are too big for your bed. They should not hang down onto the floor. Have a firm chair that has side arms. You can use this for support while you get dressed. Do not have throw rugs and other things on the floor that can make you trip. What can I do in the kitchen? Clean up any spills right away. Avoid walking on wet floors. Keep items that you use a lot in easy-to-reach places. If you need to reach something above you, use a strong step stool that has a grab bar. Keep electrical cords out of the way. Do not  use floor polish or wax that makes floors slippery. If you must use wax, use non-skid floor wax. Do not have throw rugs and other things on the floor that can make you trip. What can I do with my stairs? Do not leave any items on the stairs. Make sure that there are handrails on both sides of the stairs and use them. Fix handrails that are broken or loose. Make sure that handrails are as long as the stairways. Check any carpeting to make sure that it is firmly attached to the stairs. Fix any carpet that is loose or worn. Avoid having throw rugs at the top or bottom of the stairs. If you do have throw rugs, attach them to the floor with carpet tape. Make sure that you have a light switch at the top of the stairs and the bottom of the stairs. If you do not have them, ask someone to add them for you. What else can I do to help prevent falls? Wear shoes that: Do not have high heels. Have  rubber bottoms. Are comfortable and fit you well. Are closed at the toe. Do not wear sandals. If you use a stepladder: Make sure that it is fully opened. Do not climb a closed stepladder. Make sure that both sides of the stepladder are locked into place. Ask someone to hold it for you, if possible. Clearly mark and make sure that you can see: Any grab bars or handrails. First and last steps. Where the edge of each step is. Use tools that help you move around (mobility aids) if they are needed. These include: Canes. Walkers. Scooters. Crutches. Turn on the lights when you go into a dark area. Replace any light bulbs as soon as they burn out. Set up your furniture so you have a clear path. Avoid moving your furniture around. If any of your floors are uneven, fix them. If there are any pets around you, be aware of where they are. Review your medicines with your doctor. Some medicines can make you feel dizzy. This can increase your chance of falling. Ask your doctor what other things that you can do to help prevent falls. This information is not intended to replace advice given to you by your health care provider. Make sure you discuss any questions you have with your health care provider. Document Released: 03/22/2009 Document Revised: 11/01/2015 Document Reviewed: 06/30/2014 Elsevier Interactive Patient Education  2017 [employer].

## 2021-02-18 DIAGNOSIS — K7689 Other specified diseases of liver: Secondary | ICD-10-CM | POA: Diagnosis not present

## 2021-02-18 DIAGNOSIS — Z23 Encounter for immunization: Secondary | ICD-10-CM | POA: Diagnosis not present

## 2021-02-18 DIAGNOSIS — Z08 Encounter for follow-up examination after completed treatment for malignant neoplasm: Secondary | ICD-10-CM | POA: Diagnosis not present

## 2021-02-18 DIAGNOSIS — C250 Malignant neoplasm of head of pancreas: Secondary | ICD-10-CM | POA: Diagnosis not present

## 2021-02-18 DIAGNOSIS — Z90411 Acquired partial absence of pancreas: Secondary | ICD-10-CM | POA: Diagnosis not present

## 2021-02-18 DIAGNOSIS — Z9049 Acquired absence of other specified parts of digestive tract: Secondary | ICD-10-CM | POA: Diagnosis not present

## 2021-02-18 DIAGNOSIS — J432 Centrilobular emphysema: Secondary | ICD-10-CM | POA: Diagnosis not present

## 2021-02-18 DIAGNOSIS — Z8507 Personal history of malignant neoplasm of pancreas: Secondary | ICD-10-CM | POA: Diagnosis not present

## 2021-02-25 ENCOUNTER — Telehealth: Payer: Self-pay | Admitting: Internal Medicine

## 2021-02-25 DIAGNOSIS — E876 Hypokalemia: Secondary | ICD-10-CM

## 2021-02-25 NOTE — Telephone Encounter (Signed)
Call pt Order and sch BMP asap Potassium was extremely low as patient said at [facility] oncology last week.  Please ask for what medications were given including potassium to her. Did they supplement with increased potassium?  Is she having vomiting, or diarrhea?  Please confirm what dose of potassium she is on and how she is taking it.  Please confirm if she is taking hydrochlorothiazide and what dose

## 2021-02-25 NOTE — Telephone Encounter (Addendum)
Called to speak with [facility] concerning Potassium labs. Pt states that she was on potassium pill from Dr. Terese Door. Pt was increased from 1 pill a day to 4 pills a day by [facility]. Pt states that she is taking Potassium 110mq tablets and that she is taking the Hydrochlorothiazide 12.'5mg'$  tablets. 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 Pt has been scheduled for labs on 02/26/21. BMP has been ordered with diagnosis of hypokalemia.

## 2021-02-25 NOTE — Telephone Encounter (Signed)
Patient called in stating that she was seen at [facility] last week and was informed that she needs to have her potassium checked this week.Please advise.

## 2021-02-26 ENCOUNTER — Encounter: Payer: Self-pay | Admitting: Internal Medicine

## 2021-02-26 ENCOUNTER — Other Ambulatory Visit: Payer: Self-pay

## 2021-02-26 ENCOUNTER — Other Ambulatory Visit (INDEPENDENT_AMBULATORY_CARE_PROVIDER_SITE_OTHER): Payer: Medicare Other

## 2021-02-26 DIAGNOSIS — E876 Hypokalemia: Secondary | ICD-10-CM | POA: Diagnosis not present

## 2021-02-26 LAB — BASIC METABOLIC PANEL
BUN: 10 mg/dL (ref 6–23)
CO2: 30 mEq/L (ref 19–32)
Calcium: 9.4 mg/dL (ref 8.4–10.5)
Chloride: 104 mEq/L (ref 96–112)
Creatinine, Ser: 0.83 mg/dL (ref 0.40–1.20)
GFR: 69.16 mL/min (ref >60.00)
Glucose, Bld: 113 mg/dL — ABNORMAL HIGH (ref 70–99)
Potassium: 3.8 mEq/L (ref 3.5–5.1)
Sodium: 140 mEq/L (ref 135–145)

## 2021-02-26 NOTE — Telephone Encounter (Signed)
Pt is responding to Result note

## 2021-02-27 ENCOUNTER — Other Ambulatory Visit: Payer: Self-pay

## 2021-02-27 DIAGNOSIS — E876 Hypokalemia: Secondary | ICD-10-CM

## 2021-02-27 NOTE — Telephone Encounter (Signed)
Spoke with Mable Paris, FNP, Concerning patient potassium dosage. Original prescription was to take 1 tablet daily, Pt confirmed they now take 2 potassium tablets twice daily. Pt has a follow up with PCP in 10/22. Per Mable Paris, Left an appointment note to recheck potassium.

## 2021-03-19 ENCOUNTER — Other Ambulatory Visit: Payer: Medicare Other

## 2021-03-21 ENCOUNTER — Ambulatory Visit: Payer: Medicare Other | Admitting: Internal Medicine

## 2021-03-26 ENCOUNTER — Ambulatory Visit: Payer: Medicare Other | Admitting: Internal Medicine

## 2021-04-01 ENCOUNTER — Other Ambulatory Visit: Payer: Medicare Other

## 2021-04-01 ENCOUNTER — Ambulatory Visit: Payer: Medicare Other | Admitting: Oncology

## 2021-04-02 ENCOUNTER — Inpatient Hospital Stay: Payer: Medicare Other | Attending: Oncology

## 2021-04-02 ENCOUNTER — Other Ambulatory Visit: Payer: Self-pay

## 2021-04-02 DIAGNOSIS — Z95828 Presence of other vascular implants and grafts: Secondary | ICD-10-CM

## 2021-04-02 DIAGNOSIS — C250 Malignant neoplasm of head of pancreas: Secondary | ICD-10-CM | POA: Diagnosis not present

## 2021-04-02 DIAGNOSIS — Z452 Encounter for adjustment and management of vascular access device: Secondary | ICD-10-CM | POA: Diagnosis not present

## 2021-04-02 MED ORDER — HEPARIN SOD (PORK) LOCK FLUSH 100 UNIT/ML IV SOLN
500.0000 [IU] | Freq: Once | INTRAVENOUS | Status: AC
  Administered 2021-04-02: 500 [IU] via INTRAVENOUS
  Filled 2021-04-02: qty 5

## 2021-04-02 MED ORDER — SODIUM CHLORIDE 0.9% FLUSH
10.0000 mL | Freq: Once | INTRAVENOUS | Status: AC
  Administered 2021-04-02: 10 mL via INTRAVENOUS
  Filled 2021-04-02: qty 10

## 2021-04-02 MED ORDER — HEPARIN SOD (PORK) LOCK FLUSH 100 UNIT/ML IV SOLN
INTRAVENOUS | Status: AC
  Filled 2021-04-02: qty 5

## 2021-04-26 ENCOUNTER — Inpatient Hospital Stay: Payer: Medicare Other | Attending: Oncology

## 2021-04-26 ENCOUNTER — Other Ambulatory Visit: Payer: Self-pay

## 2021-04-26 DIAGNOSIS — Z452 Encounter for adjustment and management of vascular access device: Secondary | ICD-10-CM | POA: Diagnosis not present

## 2021-04-26 DIAGNOSIS — Z86718 Personal history of other venous thrombosis and embolism: Secondary | ICD-10-CM | POA: Insufficient documentation

## 2021-04-26 DIAGNOSIS — Z8507 Personal history of malignant neoplasm of pancreas: Secondary | ICD-10-CM | POA: Insufficient documentation

## 2021-04-26 DIAGNOSIS — Z95828 Presence of other vascular implants and grafts: Secondary | ICD-10-CM

## 2021-04-26 MED ORDER — HEPARIN SOD (PORK) LOCK FLUSH 100 UNIT/ML IV SOLN
500.0000 [IU] | Freq: Once | INTRAVENOUS | Status: AC
  Administered 2021-04-26: 500 [IU] via INTRAVENOUS
  Filled 2021-04-26: qty 5

## 2021-04-26 MED ORDER — SODIUM CHLORIDE 0.9% FLUSH
10.0000 mL | Freq: Once | INTRAVENOUS | Status: AC
  Administered 2021-04-26: 10 mL via INTRAVENOUS
  Filled 2021-04-26: qty 10

## 2021-05-17 ENCOUNTER — Encounter: Payer: Self-pay | Admitting: Internal Medicine

## 2021-05-17 ENCOUNTER — Other Ambulatory Visit: Payer: Self-pay

## 2021-05-17 ENCOUNTER — Ambulatory Visit (INDEPENDENT_AMBULATORY_CARE_PROVIDER_SITE_OTHER): Payer: Medicare Other | Admitting: Internal Medicine

## 2021-05-17 VITALS — BP 110/74 | HR 81 | Temp 97.4°F | Ht 62.99 in | Wt 156.1 lb

## 2021-05-17 DIAGNOSIS — Z1389 Encounter for screening for other disorder: Secondary | ICD-10-CM | POA: Diagnosis not present

## 2021-05-17 DIAGNOSIS — R7303 Prediabetes: Secondary | ICD-10-CM

## 2021-05-17 DIAGNOSIS — Z0000 Encounter for general adult medical examination without abnormal findings: Secondary | ICD-10-CM | POA: Diagnosis not present

## 2021-05-17 DIAGNOSIS — Z23 Encounter for immunization: Secondary | ICD-10-CM | POA: Diagnosis not present

## 2021-05-17 DIAGNOSIS — D649 Anemia, unspecified: Secondary | ICD-10-CM | POA: Diagnosis not present

## 2021-05-17 DIAGNOSIS — E039 Hypothyroidism, unspecified: Secondary | ICD-10-CM | POA: Diagnosis not present

## 2021-05-17 DIAGNOSIS — Z1231 Encounter for screening mammogram for malignant neoplasm of breast: Secondary | ICD-10-CM

## 2021-05-17 DIAGNOSIS — Z1329 Encounter for screening for other suspected endocrine disorder: Secondary | ICD-10-CM

## 2021-05-17 DIAGNOSIS — C250 Malignant neoplasm of head of pancreas: Secondary | ICD-10-CM | POA: Diagnosis not present

## 2021-05-17 DIAGNOSIS — Z13220 Encounter for screening for lipoid disorders: Secondary | ICD-10-CM | POA: Diagnosis not present

## 2021-05-17 NOTE — Patient Instructions (Addendum)
Nature MADE brand  Ferrous sulfate 325 mg daily in addition to multivitamin  Or  1 polyiron daily -[facility]   Ask if they can check iron level    Pneumococcal Conjugate Vaccine (Prevnar 13) Suspension for Injection What is this medication? PNEUMOCOCCAL VACCINE (NEU mo KOK al vak SEEN) is a vaccine used to prevent pneumococcus bacterial infections. These bacteria can cause serious infections like pneumonia, meningitis, and blood infections. This vaccine will lower your chance of getting pneumonia. If you do get pneumonia, it can make your symptoms milder and your illness shorter. This vaccine will not treat an infection and will not cause infection. This vaccine is recommended for infants and young children, adults with certain medical conditions, and adults 35 years or older. This medicine may be used for other purposes; ask your health care provider or pharmacist if you have questions. COMMON BRAND NAME(S): Prevnar, Prevnar 13 What should I tell my care team before I take this medication? They need to know if you have any of these conditions: bleeding problems fever immune system problems an unusual or allergic reaction to pneumococcal vaccine, diphtheria toxoid, other vaccines, latex, other medicines, foods, dyes, or preservatives pregnant or trying to get pregnant breast-feeding How should I use this medication? This vaccine is for injection into a muscle. It is given by a health care professional. A copy of Vaccine Information Statements will be given before each vaccination. Read this sheet carefully each time. The sheet may change frequently. Talk to your pediatrician regarding the use of this medicine in children. While this drug may be prescribed for children as young as 73 weeks old for selected conditions, precautions do apply. Overdosage: If you think you have taken too much of this medicine contact a poison control center or emergency room at once. NOTE: This medicine is only for  you. Do not share this medicine with others. What if I miss a dose? It is important not to miss your dose. Call your doctor or health care professional if you are unable to keep an appointment. What may interact with this medication? medicines for cancer chemotherapy medicines that suppress your immune function steroid medicines like prednisone or cortisone This list may not describe all possible interactions. Give your health care provider a list of all the medicines, herbs, non-prescription drugs, or dietary supplements you use. Also tell them if you smoke, drink alcohol, or use illegal drugs. Some items may interact with your medicine. What should I watch for while using this medication? Mild fever and pain should go away in 3 days or less. Report any unusual symptoms to your doctor or health care professional. What side effects may I notice from receiving this medication? Side effects that you should report to your doctor or health care professional as soon as possible: allergic reactions like skin rash, itching or hives, swelling of the face, lips, or tongue breathing problems confused fast or irregular heartbeat fever over 102 degrees F seizures unusual bleeding or bruising unusual muscle weakness Side effects that usually do not require medical attention (report to your doctor or health care professional if they continue or are bothersome): aches and pains diarrhea fever of 102 degrees F or less headache irritable loss of appetite pain, tender at site where injected trouble sleeping This list may not describe all possible side effects. Call your doctor for medical advice about side effects. You may report side effects to FDA at 1-800-FDA-1088. Where should I keep my medication? This does not apply. This vaccine  is given in a clinic, pharmacy, doctor's office, or other health care setting and will not be stored at home. NOTE: This sheet is a summary. It may not cover all  possible information. If you have questions about this medicine, talk to your doctor, pharmacist, or health care provider.  2022 Elsevier/Gold Standard (2014-03-02 00:00:00)

## 2021-05-17 NOTE — Progress Notes (Signed)
Chief Complaint  Patient presents with   Annual Exam   Annual  1. Pancreatitic cancer still with port in remission f/u [facility] 05/22/21 and Dr. Tasia Catchings 05/28/21 anemia will have Dr. Tasia Catchings address taking MVT with 44% iron will have pt take additional iron for now CA 19-9 20 normal 02/18/21 and had cmet [facility] and imaging   Review of Systems  Constitutional:  Negative for malaise/fatigue and weight loss.  HENT:  Negative for hearing loss.   Eyes:  Negative for blurred vision.  Respiratory:  Negative for shortness of breath.   Cardiovascular:  Negative for chest pain.  Gastrointestinal:  Negative for abdominal pain and blood in stool.  Genitourinary:  Negative for dysuria.  Musculoskeletal:  Negative for falls and joint pain.  Skin:  Negative for rash.  Neurological:  Negative for headaches.  Psychiatric/Behavioral:  Negative for depression.   Past Medical History:  Diagnosis Date   Anemia due to antineoplastic chemotherapy 11/25/2018   Cancer [facility])    GERD (gastroesophageal reflux disease)    Hypertension    Hypomagnesemia 11/25/2018   Hypothyroidism    Malignant neoplasm of head of pancreas ([place]) 06/08/2018   Personal history of chemotherapy 2019    Pancreatic cancer   Past Surgical History:  Procedure Laterality Date   CHOLECYSTECTOMY     COLONOSCOPY N/A 12/02/2016   Procedure: COLONOSCOPY;  Surgeon: Lollie Sails, MD;  Location: [facility] ENDOSCOPY;  Service: Endoscopy;  Laterality: N/A;   ERCP N/A 04/30/2018   Procedure: ENDOSCOPIC RETROGRADE CHOLANGIOPANCREATOGRAPHY (ERCP);  Surgeon: Lucilla Lame, MD;  Location: [facility] ENDOSCOPY;  Service: Endoscopy;  Laterality: N/A;   ERCP N/A 05/05/2018   Procedure: ENDOSCOPIC RETROGRADE CHOLANGIOPANCREATOGRAPHY (ERCP);  Surgeon: Lucilla Lame, MD;  Location: [facility] ENDOSCOPY;  Service: Endoscopy;  Laterality: N/A;   PORTA CATH INSERTION N/A 06/28/2018   Procedure: PORTA CATH INSERTION;  Surgeon: Algernon Huxley, MD;  Location: [facility] CV LAB;  Service:  Cardiovascular;  Laterality: N/A;   Family History  Adopted: Yes  Problem Relation Age of Onset   Diabetes Mellitus I Other    Alcoholism Other    Hypertension Other    Hyperlipidemia Other    Coronary artery disease Other    Stroke Other    Osteoarthritis Other    Migraines Other    Heart Problems Mother    Stroke Sister    CAD Brother    Stroke Brother    Breast cancer Neg Hx    Social History   Socioeconomic History   Marital status: Married    Spouse name: Not on file   Number of children: Not on file   Years of education: Not on file   Highest education level: Not on file  Occupational History   Not on file  Tobacco Use   Smoking status: Former    Types: Cigarettes    Quit date: 04/14/1988    Years since quitting: 33.1   Smokeless tobacco: Never  Vaping Use   Vaping Use: Never used  Substance and Sexual Activity   Alcohol use: Yes    Comment: social drinker   Drug use: No   Sexual activity: Not Currently  Other Topics Concern   Not on file  Social History Narrative   Lives at home married    In order of [place] star   Social Determinants of Health   Financial Resource Strain: Low Risk    Difficulty of Paying Living Expenses: Not hard at all  Food Insecurity: No Food Insecurity  Worried About Charity fundraiser in the Last Year: Never true   [place] in the Last Year: Never true  Transportation Needs: No Transportation Needs   Lack of Transportation (Medical): No   Lack of Transportation (Non-Medical): No  Physical Activity: Sufficiently Active   Days of Exercise per Week: 5 days   Minutes of Exercise per Session: 30 min  Stress: No Stress Concern Present   Feeling of Stress : Not at all  Social Connections: Unknown   Frequency of Communication with Friends and Family: Not on file   Frequency of Social Gatherings with Friends and Family: Not on file   Attends Religious Services: Not on file   Active Member of Clubs or Organizations: Not  on file   Attends Archivist Meetings: Not on file   Marital Status: Married  Human resources officer Violence: Not At Risk   Fear of Current or Ex-Partner: No   Emotionally Abused: No   Physically Abused: No   Sexually Abused: No   Current Meds  Medication Sig   aspirin (ASPIRIN 81) 81 MG EC tablet Take 2 tablets (162 mg total) by mouth daily. Swallow whole.   Cholecalciferol (VITAMIN D-3 PO) Take 50 mcg by mouth daily.    CREON 36000-114000 units CPEP capsule Take 36,000 Units by mouth 3 (three) times daily.   diclofenac Sodium (VOLTAREN) 1 % GEL Apply topically 4 (four) times daily.   fluticasone (FLONASE) 50 MCG/ACT nasal spray Place 2 sprays into both nostrils daily. After nasal saline   gabapentin (NEURONTIN) 400 MG capsule TAKE 1 CAPSULE BY MOUTH 3  TIMES DAILY   hydrochlorothiazide (HYDRODIURIL) 12.5 MG tablet TAKE 1 TABLET BY MOUTH  DAILY IN THE MORNING   hydrocortisone 2.5 % ointment Apply topically 2 (two) times daily. Prn   levothyroxine (SYNTHROID) 100 MCG tablet Take 1 tablet (100 mcg total) by mouth daily before breakfast.   loperamide (IMODIUM) 2 MG capsule Take 2 mg by mouth 3 (three) times daily as needed for diarrhea or loose stools.   Magnesium Cl-Calcium Carbonate (SLOW-MAG PO) Take by mouth. 2 QD   Multiple Vitamins-Minerals (CENTRUM SILVER 50+WOMEN PO) Take by mouth.   potassium chloride (KLOR-CON) 10 MEQ tablet TAKE 1 TABLET BY MOUTH  DAILY   sodium chloride (OCEAN) 0.65 % SOLN nasal spray Place 2 sprays into both nostrils daily as needed for congestion.   TURMERIC CURCUMIN PO Take by mouth 3 (three) times daily with meals.   Allergies  Allergen Reactions   Other     Hair dye blisters    Recent Results (from the past 2160 hour(s))  Basic Metabolic Panel (BMET)     Status: Abnormal   Collection Time: 02/26/21  9:11 AM  Result Value Ref Range   Sodium 140 135 - 145 mEq/L   Potassium 3.8 3.5 - 5.1 mEq/L   Chloride 104 96 - 112 mEq/L   CO2 30 19 - 32  mEq/L   Glucose, Bld 113 (H) 70 - 99 mg/dL   BUN 10 6 - 23 mg/dL   Creatinine, Ser 0.83 0.40 - 1.20 mg/dL   GFR 69.16 >60.00 mL/min    Comment: Calculated using the CKD-EPI Creatinine Equation (2021)   Calcium 9.4 8.4 - 10.5 mg/dL   Objective  Body mass index is 27.66 kg/m. Wt Readings from Last 3 Encounters:  05/17/21 156 lb 1.9 oz (70.8 kg)  02/05/21 155 lb (70.3 kg)  10/26/20 159 lb 9.6 oz (72.4 kg)   Temp  Readings from Last 3 Encounters:  05/17/21 (!) 97.4 F (36.3 C) (Temporal)  10/26/20 (!) 97.3 F (36.3 C)  09/12/20 98.1 F (36.7 C) (Oral)   BP Readings from Last 3 Encounters:  05/17/21 110/74  02/05/21 125/76  10/26/20 116/75   Pulse Readings from Last 3 Encounters:  05/17/21 81  10/26/20 80  09/12/20 71    Physical Exam Vitals and nursing note reviewed.  Constitutional:      Appearance: Normal appearance. She is well-developed and well-groomed.  HENT:     Head: Normocephalic and atraumatic.  Eyes:     Conjunctiva/sclera: Conjunctivae normal.     Pupils: Pupils are equal, round, and reactive to light.  Cardiovascular:     Rate and Rhythm: Normal rate and regular rhythm.     Heart sounds: Normal heart sounds. No murmur heard. Pulmonary:     Effort: Pulmonary effort is normal.     Breath sounds: Normal breath sounds.  Abdominal:     General: Abdomen is flat. Bowel sounds are normal.     Tenderness: There is no abdominal tenderness.  Musculoskeletal:        General: No tenderness.  Skin:    General: Skin is warm and dry.  Neurological:     General: No focal deficit present.     Mental Status: She is alert and oriented to person, place, and time. Mental status is at baseline.     Cranial Nerves: Cranial nerves 2-12 are intact.     Gait: Gait is intact.  Psychiatric:        Attention and Perception: Attention and perception normal.        Mood and Affect: Mood and affect normal.        Speech: Speech normal.        Behavior: Behavior normal.  Behavior is cooperative.        Thought Content: Thought content normal.        Cognition and Memory: Cognition and memory normal.        Judgment: Judgment normal.    Assessment  Plan  Annual physical exam See below  Anemia,f/u Dr. Tasia Catchings  See hpi  Prediabetes - Plan: Hemoglobin A1c Lipid screening - Plan: Lipid panel Thyroid disorder screening - Plan: TSH Acquired hypothyroidism - Plan: TSH See if Dr. Tasia Catchings can do labs   Malignant neoplasm of head of pancreas ([place]) in remission  F/u Dr. Tasia Catchings 12/20 and [facility] 05/22/21  See labs 02/18/21 [facility]   Flu shot declines Prevnar given today consider pna 23 1 year Tdap, shingrix declines  Pfizer 5/5    mammo  07/26/20 negative  Colonoscopy 12/02/16 sessile polyp and tubular adenoma [facility]  Check with GI to see when due   CT ab/pelvis given history had 02/18/21 [facility]   No pap due dexa 07/27/15 normal  rec centrum silver or nature made mvt with iron  Provider: Dr. Olivia Mackie McLean-Scocuzza-Internal Medicine

## 2021-05-20 DIAGNOSIS — E876 Hypokalemia: Secondary | ICD-10-CM | POA: Diagnosis not present

## 2021-05-20 DIAGNOSIS — Z9049 Acquired absence of other specified parts of digestive tract: Secondary | ICD-10-CM | POA: Diagnosis not present

## 2021-05-20 DIAGNOSIS — C250 Malignant neoplasm of head of pancreas: Secondary | ICD-10-CM | POA: Diagnosis not present

## 2021-05-20 DIAGNOSIS — Z8507 Personal history of malignant neoplasm of pancreas: Secondary | ICD-10-CM | POA: Diagnosis not present

## 2021-05-20 DIAGNOSIS — Z08 Encounter for follow-up examination after completed treatment for malignant neoplasm: Secondary | ICD-10-CM | POA: Diagnosis not present

## 2021-05-20 DIAGNOSIS — C259 Malignant neoplasm of pancreas, unspecified: Secondary | ICD-10-CM | POA: Diagnosis not present

## 2021-05-22 ENCOUNTER — Other Ambulatory Visit: Payer: Self-pay | Admitting: *Deleted

## 2021-05-22 DIAGNOSIS — C250 Malignant neoplasm of head of pancreas: Secondary | ICD-10-CM

## 2021-05-28 ENCOUNTER — Other Ambulatory Visit: Payer: Self-pay

## 2021-05-28 ENCOUNTER — Inpatient Hospital Stay (HOSPITAL_BASED_OUTPATIENT_CLINIC_OR_DEPARTMENT_OTHER): Payer: Medicare Other | Admitting: Oncology

## 2021-05-28 ENCOUNTER — Inpatient Hospital Stay: Payer: Medicare Other | Attending: Oncology

## 2021-05-28 ENCOUNTER — Encounter: Payer: Self-pay | Admitting: Oncology

## 2021-05-28 VITALS — BP 117/56 | HR 67 | Temp 98.3°F | Resp 18 | Wt 157.0 lb

## 2021-05-28 DIAGNOSIS — Z86718 Personal history of other venous thrombosis and embolism: Secondary | ICD-10-CM

## 2021-05-28 DIAGNOSIS — I10 Essential (primary) hypertension: Secondary | ICD-10-CM | POA: Diagnosis not present

## 2021-05-28 DIAGNOSIS — Z95828 Presence of other vascular implants and grafts: Secondary | ICD-10-CM

## 2021-05-28 DIAGNOSIS — C250 Malignant neoplasm of head of pancreas: Secondary | ICD-10-CM | POA: Insufficient documentation

## 2021-05-28 DIAGNOSIS — Z87891 Personal history of nicotine dependence: Secondary | ICD-10-CM | POA: Diagnosis not present

## 2021-05-28 DIAGNOSIS — C779 Secondary and unspecified malignant neoplasm of lymph node, unspecified: Secondary | ICD-10-CM | POA: Diagnosis not present

## 2021-05-28 DIAGNOSIS — D649 Anemia, unspecified: Secondary | ICD-10-CM | POA: Diagnosis not present

## 2021-05-28 LAB — COMPREHENSIVE METABOLIC PANEL
ALT: 15 U/L (ref 0–44)
AST: 19 U/L (ref 15–41)
Albumin: 4 g/dL (ref 3.5–5.0)
Alkaline Phosphatase: 53 U/L (ref 38–126)
Anion gap: 7 (ref 5–15)
BUN: 14 mg/dL (ref 8–23)
CO2: 23 mmol/L (ref 22–32)
Calcium: 9.2 mg/dL (ref 8.9–10.3)
Chloride: 103 mmol/L (ref 98–111)
Creatinine, Ser: 0.88 mg/dL (ref 0.44–1.00)
GFR, Estimated: >60 mL/min (ref >60)
Glucose, Bld: 112 mg/dL — ABNORMAL HIGH (ref 70–99)
Potassium: 3.8 mmol/L (ref 3.5–5.1)
Sodium: 133 mmol/L — ABNORMAL LOW (ref 135–145)
Total Bilirubin: 0.5 mg/dL (ref 0.3–1.2)
Total Protein: 7.3 g/dL (ref 6.5–8.1)

## 2021-05-28 LAB — CBC WITH DIFFERENTIAL/PLATELET
Abs Immature Granulocytes: 0.01 10*3/uL (ref 0.00–0.07)
Basophils Absolute: 0 10*3/uL (ref 0.0–0.1)
Basophils Relative: 0 %
Eosinophils Absolute: 0.1 10*3/uL (ref 0.0–0.5)
Eosinophils Relative: 1 %
HCT: 32.4 % — ABNORMAL LOW (ref 36.0–46.0)
Hemoglobin: 10.4 g/dL — ABNORMAL LOW (ref 12.0–15.0)
Immature Granulocytes: 0 %
Lymphocytes Relative: 32 %
Lymphs Abs: 2.2 10*3/uL (ref 0.7–4.0)
MCH: 28.4 pg (ref 26.0–34.0)
MCHC: 32.1 g/dL (ref 30.0–36.0)
MCV: 88.5 fL (ref 80.0–100.0)
Monocytes Absolute: 0.4 10*3/uL (ref 0.1–1.0)
Monocytes Relative: 6 %
Neutro Abs: 4.2 10*3/uL (ref 1.7–7.7)
Neutrophils Relative %: 61 %
Platelets: 170 10*3/uL (ref 150–400)
RBC: 3.66 MIL/uL — ABNORMAL LOW (ref 3.87–5.11)
RDW: 14.9 % (ref 11.5–15.5)
WBC: 6.9 10*3/uL (ref 4.0–10.5)
nRBC: 0 % (ref 0.0–0.2)

## 2021-05-28 LAB — RETIC PANEL
Immature Retic Fract: 10.4 % (ref 2.3–15.9)
RBC.: 3.64 MIL/uL — ABNORMAL LOW (ref 3.87–5.11)
Retic Count, Absolute: 57.1 10*3/uL (ref 19.0–186.0)
Retic Ct Pct: 1.6 % (ref 0.4–3.1)
Reticulocyte Hemoglobin: 32 pg (ref >27.9)

## 2021-05-28 MED ORDER — SODIUM CHLORIDE 0.9% FLUSH
10.0000 mL | Freq: Once | INTRAVENOUS | Status: AC
  Administered 2021-05-28: 10:00:00 10 mL via INTRAVENOUS
  Filled 2021-05-28: qty 10

## 2021-05-28 MED ORDER — HEPARIN SOD (PORK) LOCK FLUSH 100 UNIT/ML IV SOLN
500.0000 [IU] | Freq: Once | INTRAVENOUS | Status: AC
  Administered 2021-05-28: 10:00:00 500 [IU] via INTRAVENOUS
  Filled 2021-05-28: qty 5

## 2021-05-28 NOTE — Progress Notes (Signed)
Pt here for follow up. No new concerns voiced.   

## 2021-05-28 NOTE — Progress Notes (Signed)
Hematology/Oncology Follow Up Note  [phone] [phone]  Patient Care Team: McLean-Scocuzza, Nino Glow, MD as PCP - General (Internal Medicine) Clent Jacks, RN as Registered Nurse Jonathon Bellows, MD as Consulting Physician (Gastroenterology) Clent Jacks, RN as Oncology Nurse Navigator Earlie Server, MD as Consulting Physician (Oncology) Fayrene Helper Stacie Acres, MD as Referring Physician (Oncology) Algernon Huxley, MD as Referring Physician (Vascular Surgery)   Name of the patient: Lynn Taylor  [id number]  [birth year]   REASON FOR VISIT  follow-up for pancreatic cancer.  HISTORY OF PRESENTING ILLNESS:  Lynn Taylor is a  75 y.o.  female with PMH listed below who was referred to me for evaluation of evaluation of elevated ferritin. Patient recently had lab work-up done on 04/09/2018 which showed ferritin level 1877, iron 97, TIBC 340, iron saturation 29, 04/11/2018 CBC showed hemoglobin 13.2, MCV 81, WBC 7.6, platelet counts 246,000. Patient was referred to hematology for further evaluation of elevated ferritin.   #04/28/2018 Ultrasound of liver was obtained which showed CBD and intrahepatic biliary dilatation Patient wa she is s advised to go to emergency room for further evaluation. Also had hyperbilirubinemia.  Patient was complaining about being jaundiced, pruritus all over. MR abdomen MRCP with and without contrast showed market biliary duct dilatation, secondary to obstruction in the region of pancreatic head.  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 No abdominal adenopathy, liver metastasis or cross vascular involvement.  patient was evaluated by gastroenterology and status post ERCP with stenting. CA 19.9 1173 CEA 3.9 # 05/25/2018  patient was referred to [facility] and s/p whipple resection.Her postop course was c/b Type A pancreatic leak and c.diff. Pathology showed A. Hepatic artery lymph node,  excision: One lymph node, negative for malignancy (0/1). B. Biliary stent, removal: Medical device, consistent with stent, gross examination only. C. 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 All margins are negative (closest margin=uncinate, 0.5 mm) Metastatic adenocarcinoma in one of thirty-two lymph nodes (1/32).  Portion of stomach and duodenum with no specific pathologic diagnosis.   Grade, 3 poorly differentiated.  Pathologic pT2 pN1   Cancer Treatment 07/13/2018 Cycle 1 FOLFIRINOX with Onpro Neulasta.  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 10/06/2018 cycle 6  mFOLFIRINOX with Onpro Neulasta - [50% dose for Irinotecan- 5-FU bolus omitted]. 10/20/2018 cycle 7 mFOLFIRINOX with Onpro Neulasta - [50% dose for Irinotecan- 5-FU bolus omitted].  # 04/02/2020 [facility] CT chest abdomen pelvis  Postsurgical changes from Whipple procedure. Minimally increased dilation of the main pancreatic duct, which is nonspecific. Overall stable appearance of the pancreaticojejunostomy. Recommend attention on follow-up.  Decreased, previously prominent right axillary lymph node. No definite findings of new or increased metastatic disease in the chest, abdomen, or pelvis.    INTERVAL HISTORY Lynn Taylor is a 75 y.o. female with history of stage IIb pancreatic cancer presents to for  follow-up.   Patient was recently seen at [facility] and had a CT scan done there. 05/20/2021, CT  chest abdomen pelvis with contrast showed no evidence of recurrent or metastatic disease in the chest abdomen pelvis. Denies any unintentional weight loss, fever, chills, diarrhea, abdominal pain.  Patient has gained weight.    Review of Systems  Constitutional:  Negative for appetite change, chills, fatigue, fever and unexpected weight change.  HENT:   Negative for hearing loss and voice change.   Eyes:  Negative for eye problems.  Respiratory:  Negative for chest tightness, cough and shortness of breath.   Cardiovascular:  Negative for chest pain and leg swelling.  Gastrointestinal:  Negative for abdominal distention, abdominal pain, blood in stool and diarrhea.  Endocrine: Negative for hot flashes.  Genitourinary:  Negative for difficulty urinating and frequency.   Musculoskeletal:  Negative for arthralgias.  Skin:  Negative for itching and rash.  Neurological:  Positive for numbness. Negative for extremity weakness.  Hematological:  Negative for adenopathy.  Psychiatric/Behavioral:  Negative for confusion.      Allergies  Allergen Reactions   Other     Hair dye blisters      Past Medical History:  Diagnosis Date   Anemia due to antineoplastic chemotherapy 11/25/2018   Cancer ([place])    GERD (gastroesophageal reflux disease)    Hypertension    Hypomagnesemia 11/25/2018   Hypothyroidism    Malignant neoplasm of head of pancreas ([place]) 06/08/2018   Personal history of chemotherapy 2019    Pancreatic cancer     Past Surgical History:  Procedure Laterality Date   CHOLECYSTECTOMY     COLONOSCOPY N/A 12/02/2016   Procedure: COLONOSCOPY;  Surgeon: Lollie Sails, MD;  Location: [facility] ENDOSCOPY;  Service: Endoscopy;  Laterality: N/A;   ERCP N/A 04/30/2018   Procedure: ENDOSCOPIC RETROGRADE CHOLANGIOPANCREATOGRAPHY (ERCP);  Surgeon: Lucilla Lame, MD;  Location: [facility] ENDOSCOPY;   Service: Endoscopy;  Laterality: N/A;   ERCP N/A 05/05/2018   Procedure: ENDOSCOPIC RETROGRADE CHOLANGIOPANCREATOGRAPHY (ERCP);  Surgeon: Lucilla Lame, MD;  Location[facility] ENDOSCOPY;  Service: Endoscopy;  Laterality: N/A;   PORTA CATH INSERTION N/A 06/28/2018   Procedure: PORTA CATH INSERTION;  Surgeon: Algernon Huxley, MD;  Location: [place] CV LAB;  Service: Cardiovascular;  Laterality: N/A;    Social History   Socioeconomic History   Marital status: Married    Spouse name: Not on file   Number of children: Not on file   Years of education: Not on file   Highest education level: Not on file  Occupational History   Not on file  Tobacco Use   Smoking status: Former    Types: Cigarettes    Quit date: 04/14/1988    Years since quitting: 33.1   Smokeless tobacco: Never  Vaping Use   Vaping Use: Never used  Substance and Sexual Activity   Alcohol use: Yes    Comment: social drinker   Drug use: No   Sexual activity: Not Currently  Other Topics Concern   Not on file  Social History Narrative   Lives at home married    In order of [place] star   Social Determinants of Health   Financial Resource Strain: Low Risk    Difficulty of Paying Living Expenses: Not hard at all  Food Insecurity: No Food Insecurity   Worried About Charity fundraiser in the Last Year: Never true   Arboriculturist in the Last Year: Never true  Transportation Needs: No Transportation Needs   Lack of Transportation (Medical): No   Lack of Transportation (Non-Medical): No  Physical Activity:  Sufficiently Active   Days of Exercise per Week: 5 days   Minutes of Exercise per Session: 30 min  Stress: No Stress Concern Present   Feeling of Stress : Not at all  Social Connections: Unknown   Frequency of Communication with Friends and Family: Not on file   Frequency of Social Gatherings with Friends and Family: Not on file   Attends Religious Services: Not on file   Active Member of Clubs or Organizations:  Not on file   Attends Archivist Meetings: Not on file   Marital Status: Married  Human resources officer Violence: Not At Risk   Fear of Current or Ex-Partner: No   Emotionally Abused: No   Physically Abused: No   Sexually Abused: No    Family History  Adopted: Yes  Problem Relation Age of Onset   Diabetes Mellitus I Other    Alcoholism Other    Hypertension Other    Hyperlipidemia Other    Coronary artery disease Other    Stroke Other    Osteoarthritis Other    Migraines Other    Heart Problems Mother    Stroke Sister    CAD Brother    Stroke Brother    Breast cancer Neg Hx      Current Outpatient Medications:    ascorbic acid (VITAMIN C) 1000 MG tablet, Take by mouth., Disp: , Rfl:    aspirin (ASPIRIN 81) 81 MG EC tablet, Take 2 tablets (162 mg total) by mouth daily. Swallow whole., Disp: 180 tablet, Rfl: 1   Cholecalciferol (VITAMIN D-3 PO), Take 50 mcg by mouth daily. , Disp: , Rfl:    CREON 36000-114000 units CPEP capsule, Take 36,000 Units by mouth 3 (three) times daily., Disp: , Rfl:    diclofenac Sodium (VOLTAREN) 1 % GEL, Apply topically 4 (four) times daily., Disp: , Rfl:    fluticasone (FLONASE) 50 MCG/ACT nasal spray, Place 2 sprays into both nostrils daily. After nasal saline, Disp: 16 g, Rfl: 2   gabapentin (NEURONTIN) 400 MG capsule, TAKE 1 CAPSULE BY MOUTH 3  TIMES DAILY, Disp: 270 capsule, Rfl: 3   hydrochlorothiazide (HYDRODIURIL) 12.5 MG tablet, TAKE 1 TABLET BY MOUTH  DAILY IN THE MORNING, Disp: 90 tablet, Rfl: 3   hydrocortisone 2.5 % ointment, Apply topically 2 (two) times daily. Prn, Disp: 30 g, Rfl: 0   levothyroxine (SYNTHROID) 100 MCG tablet, Take 1 tablet (100 mcg total) by mouth daily before breakfast., Disp: 90 tablet, Rfl: 3   loperamide (IMODIUM) 2 MG capsule, Take 2 mg by mouth 3 (three) times daily as needed for diarrhea or loose stools., Disp: , Rfl:    Magnesium Cl-Calcium Carbonate (SLOW-MAG PO), Take by mouth. 2 QD, Disp: , Rfl:     Multiple Vitamins-Minerals (CENTRUM SILVER 50+WOMEN PO), Take by mouth., Disp: , Rfl:    potassium chloride (KLOR-CON) 10 MEQ tablet, TAKE 1 TABLET BY MOUTH  DAILY, Disp: 90 tablet, Rfl: 3   Simethicone 125 MG CAPS, Take by mouth., Disp: , Rfl:    sodium chloride (OCEAN) 0.65 % SOLN nasal spray, Place 2 sprays into both nostrils daily as needed for congestion., Disp: 30 mL, Rfl: 2   TURMERIC CURCUMIN PO, Take by mouth 3 (three) times daily with meals., Disp: , Rfl:    acetaminophen-codeine (TYLENOL #3) 300-30 MG tablet, Take 1 tablet by mouth every 4 (four) hours as needed for moderate pain. (Patient not taking: Reported on 05/17/2021), Disp: 20 tablet, Rfl: 0   ondansetron (ZOFRAN) 4  MG tablet, TAKE 1 TABLET BY MOUTH  EVERY 6 HOURS AS NEEDED FOR NAUSEA OR VOMITING. (Patient not taking: Reported on 05/17/2021), Disp: 180 tablet, Rfl: 1  Physical exam:  Vitals:   05/28/21 1007  BP: (!) 117/56  Pulse: 67  Resp: 18  Temp: 98.3 F (36.8 C)  Weight: 157 lb (71.2 kg)   Physical Exam Constitutional:      General: She is not in acute distress. HENT:     Head: Normocephalic and atraumatic.  Eyes:     General: No scleral icterus.    Pupils: Pupils are equal, round, and reactive to light.  Cardiovascular:     Rate and Rhythm: Normal rate and regular rhythm.     Heart sounds: Normal heart sounds.  Pulmonary:     Effort: Pulmonary effort is normal. No respiratory distress.     Breath sounds: No wheezing.  Abdominal:     General: Bowel sounds are normal. There is no distension.     Palpations: Abdomen is soft. There is no mass.     Tenderness: There is no abdominal tenderness.  Musculoskeletal:        General: No swelling or deformity. Normal range of motion.     Cervical back: Normal range of motion and neck supple.  Skin:    General: Skin is warm and dry.     Findings: No erythema or rash.  Neurological:     Mental Status: She is alert and oriented to person, place, and time. Mental  status is at baseline.     Cranial Nerves: No cranial nerve deficit.     Coordination: Coordination normal.  Psychiatric:        Mood and Affect: Mood normal.    CMP Latest Ref Rng & Units 05/28/2021  Glucose 70 - 99 mg/dL 112(H)  BUN 8 - 23 mg/dL 14  Creatinine 0.44 - 1.00 mg/dL 0.88  Sodium 135 - 145 mmol/L 133(L)  Potassium 3.5 - 5.1 mmol/L 3.8  Chloride 98 - 111 mmol/L 103  CO2 22 - 32 mmol/L 23  Calcium 8.9 - 10.3 mg/dL 9.2  Total Protein 6.5 - 8.1 g/dL 7.3  Total Bilirubin 0.3 - 1.2 mg/dL 0.5  Alkaline Phos 38 - 126 U/L 53  AST 15 - 41 U/L 19  ALT 0 - 44 U/L 15   CBC Latest Ref Rng & Units 05/28/2021  WBC 4.0 - 10.5 K/uL 6.9  Hemoglobin 12.0 - 15.0 g/dL 10.4(L)  Hematocrit 36.0 - 46.0 % 32.4(L)  Platelets 150 - 400 K/uL 170   RADIOGRAPHIC STUDIES: I have personally reviewed the radiological images as listed and agreed with the findings in the report.  No results found.   Assessment and plan Patient is a 75 y.o. female history of stage IIb pancreatic cancer present for discussion of image results and management of newly diagnosed bilateral lower extremity DVT. 1. Malignant neoplasm of head of pancreas ([place])   2. Port-A-Cath in place   3. History of deep vein thrombosis (DVT) of lower extremity   4. Normocytic anemia    #Stage IIB pancreatic cancer status post Whipple resection [05/25/18]. Status post 7 out of the 12 planned cycles of mFOLFIRINOX with difficulties and have decided not to proceed with additional chemo. No evidence of metastatic disease on recent CT scan done at [facility]. CA19.9 was 19, at [facility]. Continue to follow-up for surveillance.  She is 3 years out, continue follow-up every 6 months.  CA 19-9, CT scan every 6 to 12  months.  #Normocytic anemia, hemoglobin 10.4.  Reticulocyte panel showed adequate reticulocyte hemoglobin.  Less likely iron deficient. Recommend patient to take empiric vitamin B12 supplementation. Will check B12 folate at the next  visit.  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 Continue Aspirin 162mg  daily as prophylaxis.   #Chemotherapy-induced neuropathy, continue gabapentin 400 mg 3 times daily. #Port-A-Cath in place, continue port flush every 8 weeks.   follow-up in July 2023.  Earlie Server, MD, PhD 05/28/2021

## 2021-05-29 ENCOUNTER — Telehealth: Payer: Self-pay | Admitting: Oncology

## 2021-05-29 LAB — CANCER ANTIGEN 19-9: CA 19-9: 19 U/mL (ref 0–35)

## 2021-05-29 NOTE — Telephone Encounter (Signed)
Pt called to cancel her appt for June 7,2023.

## 2021-05-29 NOTE — Telephone Encounter (Signed)
Appt cancelled, will be getting port flush at [facility] at that time.

## 2021-05-29 NOTE — Telephone Encounter (Signed)
Dont see an appt scheduled on 6/7

## 2021-06-26 DIAGNOSIS — E785 Hyperlipidemia, unspecified: Secondary | ICD-10-CM | POA: Diagnosis not present

## 2021-06-26 DIAGNOSIS — E042 Nontoxic multinodular goiter: Secondary | ICD-10-CM | POA: Diagnosis not present

## 2021-06-26 DIAGNOSIS — C259 Malignant neoplasm of pancreas, unspecified: Secondary | ICD-10-CM | POA: Diagnosis not present

## 2021-06-26 DIAGNOSIS — E890 Postprocedural hypothyroidism: Secondary | ICD-10-CM | POA: Diagnosis not present

## 2021-06-26 DIAGNOSIS — E559 Vitamin D deficiency, unspecified: Secondary | ICD-10-CM | POA: Diagnosis not present

## 2021-06-26 DIAGNOSIS — C50019 Malignant neoplasm of nipple and areola, unspecified female breast: Secondary | ICD-10-CM | POA: Diagnosis not present

## 2021-06-26 DIAGNOSIS — I10 Essential (primary) hypertension: Secondary | ICD-10-CM | POA: Diagnosis not present

## 2021-06-28 ENCOUNTER — Other Ambulatory Visit: Payer: Self-pay | Admitting: Internal Medicine

## 2021-06-28 DIAGNOSIS — I10 Essential (primary) hypertension: Secondary | ICD-10-CM

## 2021-06-28 DIAGNOSIS — G629 Polyneuropathy, unspecified: Secondary | ICD-10-CM

## 2021-07-03 DIAGNOSIS — E559 Vitamin D deficiency, unspecified: Secondary | ICD-10-CM | POA: Diagnosis not present

## 2021-07-03 DIAGNOSIS — E042 Nontoxic multinodular goiter: Secondary | ICD-10-CM | POA: Diagnosis not present

## 2021-07-03 DIAGNOSIS — E785 Hyperlipidemia, unspecified: Secondary | ICD-10-CM | POA: Diagnosis not present

## 2021-07-03 DIAGNOSIS — K229 Disease of esophagus, unspecified: Secondary | ICD-10-CM | POA: Diagnosis not present

## 2021-07-03 DIAGNOSIS — C259 Malignant neoplasm of pancreas, unspecified: Secondary | ICD-10-CM | POA: Diagnosis not present

## 2021-07-03 DIAGNOSIS — E890 Postprocedural hypothyroidism: Secondary | ICD-10-CM | POA: Diagnosis not present

## 2021-07-03 DIAGNOSIS — I10 Essential (primary) hypertension: Secondary | ICD-10-CM | POA: Diagnosis not present

## 2021-07-24 ENCOUNTER — Other Ambulatory Visit: Payer: Self-pay

## 2021-07-24 ENCOUNTER — Inpatient Hospital Stay: Payer: Medicare Other | Attending: Oncology

## 2021-07-24 DIAGNOSIS — Z95828 Presence of other vascular implants and grafts: Secondary | ICD-10-CM

## 2021-07-24 DIAGNOSIS — C250 Malignant neoplasm of head of pancreas: Secondary | ICD-10-CM | POA: Insufficient documentation

## 2021-07-24 MED ORDER — HEPARIN SOD (PORK) LOCK FLUSH 100 UNIT/ML IV SOLN
500.0000 [IU] | Freq: Once | INTRAVENOUS | Status: AC
  Administered 2021-07-24: 500 [IU] via INTRAVENOUS
  Filled 2021-07-24: qty 5

## 2021-07-24 MED ORDER — SODIUM CHLORIDE 0.9% FLUSH
10.0000 mL | Freq: Once | INTRAVENOUS | Status: AC
  Administered 2021-07-24: 10 mL via INTRAVENOUS
  Filled 2021-07-24: qty 10

## 2021-07-29 ENCOUNTER — Other Ambulatory Visit: Payer: Self-pay | Admitting: Internal Medicine

## 2021-07-29 ENCOUNTER — Other Ambulatory Visit: Payer: Self-pay

## 2021-07-29 ENCOUNTER — Ambulatory Visit
Admission: RE | Admit: 2021-07-29 | Discharge: 2021-07-29 | Disposition: A | Discharge status: Home / Self Care | Payer: Medicare Other | Source: Ambulatory Visit | Attending: Internal Medicine | Admitting: Internal Medicine

## 2021-07-29 DIAGNOSIS — E876 Hypokalemia: Secondary | ICD-10-CM

## 2021-07-29 DIAGNOSIS — Z1231 Encounter for screening mammogram for malignant neoplasm of breast: Secondary | ICD-10-CM | POA: Diagnosis not present

## 2021-07-30 ENCOUNTER — Other Ambulatory Visit: Payer: Self-pay | Admitting: Internal Medicine

## 2021-07-30 DIAGNOSIS — N6489 Other specified disorders of breast: Secondary | ICD-10-CM

## 2021-07-30 DIAGNOSIS — R928 Other abnormal and inconclusive findings on diagnostic imaging of breast: Secondary | ICD-10-CM

## 2021-08-13 ENCOUNTER — Other Ambulatory Visit: Payer: Self-pay

## 2021-08-13 ENCOUNTER — Ambulatory Visit
Admission: RE | Admit: 2021-08-13 | Discharge: 2021-08-13 | Disposition: A | Discharge status: Home / Self Care | Payer: Medicare Other | Source: Ambulatory Visit | Attending: Internal Medicine | Admitting: Internal Medicine

## 2021-08-13 DIAGNOSIS — R928 Other abnormal and inconclusive findings on diagnostic imaging of breast: Secondary | ICD-10-CM | POA: Diagnosis not present

## 2021-08-13 DIAGNOSIS — N6489 Other specified disorders of breast: Secondary | ICD-10-CM

## 2021-08-13 DIAGNOSIS — R922 Inconclusive mammogram: Secondary | ICD-10-CM | POA: Diagnosis not present

## 2021-08-20 ENCOUNTER — Ambulatory Visit (INDEPENDENT_AMBULATORY_CARE_PROVIDER_SITE_OTHER): Payer: Medicare Other | Admitting: Adult Health

## 2021-08-20 ENCOUNTER — Other Ambulatory Visit: Payer: Self-pay

## 2021-08-20 VITALS — BP 130/70 | HR 68 | Temp 97.3°F | Ht 62.0 in | Wt 152.0 lb

## 2021-08-20 DIAGNOSIS — H5789 Other specified disorders of eye and adnexa: Secondary | ICD-10-CM | POA: Diagnosis not present

## 2021-08-20 DIAGNOSIS — H109 Unspecified conjunctivitis: Secondary | ICD-10-CM

## 2021-08-20 MED ORDER — OFLOXACIN 0.3 % OP SOLN: OPHTHALMIC | 0 refills | Status: DC

## 2021-08-20 NOTE — Patient Instructions (Signed)
Also start Claritin daily. Recommend [facility] for further evaluation.  ?If any eye pain or vision changes this is an eye emergency be seen at eye doctor and or ER immediately.  ? ? ?Bacterial Conjunctivitis, Adult ?Bacterial conjunctivitis is an infection of your conjunctiva. This is the clear membrane that covers the white part of your eye and the inner part of your eyelid. This infection can make your eye: ?Red or pink. ?Itchy or irritated. ?This condition spreads easily from person to person (is contagious) and from one eye to the other eye. ?What are the causes? ?This condition is caused by germs (bacteria). You may get the infection if you come into close contact with: ?A person who has the infection. ?Items that have germs on them (are contaminated), such as face towels, contact lens solution, or eye makeup. ?What increases the risk? ?You are more likely to get this condition if: ?You have contact with people who have the infection. ?You wear contact lenses. ?You have a sinus infection. ?You have had a recent eye injury or surgery. ?You have a weak body defense system (immune system). ?You have dry eyes. ?What are the signs or symptoms? ? ?Thick, yellowish discharge from the eye. ?Tearing or watery eyes. ?Itchy eyes. ?Burning feeling in your eyes. ?Eye redness. ?Swollen eyelids. ?Blurred vision. ?How is this treated? ? ?Antibiotic eye drops or ointment. ?Antibiotic medicine taken by mouth. This is used for infections that do not get better with drops or ointment or that last more than 10 days. ?Cool, wet cloths placed on the eyes. ?Artificial tears used 2-6 times a day. ?Follow these instructions at home: ?Medicines ?Take or apply your antibiotic medicine as told by your doctor. Do not stop using it even if you start to feel better. ?Take or apply over-the-counter and prescription medicines only as told by your doctor. ?Do not touch your eyelid with the eye-drop bottle or the ointment  tube. ?Managing discomfort ?Wipe any fluid from your eye with a warm, wet washcloth or a cotton ball. ?Place a clean, cool, wet cloth on your eye. Do this for 10-20 minutes, 3-4 times a day. ?General instructions ?Do not wear contacts until the infection is gone. Wear glasses until your doctor says it is okay to wear contacts again. ?Do not wear eye makeup until the infection is gone. Throw away old eye makeup. ?Change or wash your pillowcase every day. ?Do not share towels or washcloths. ?Wash your hands often with soap and water for at least 20 seconds and especially before touching your face or eyes. Use paper towels to dry your hands. ?Do not touch or rub your eyes. ?Do not drive or use heavy machinery if your vision is blurred. ?Contact a doctor if: ?You have a fever. ?You do not get better after 10 days. ?Get help right away if: ?You have a fever and your symptoms get worse all of a sudden. ?You have very bad pain when you move your eye. ?Your face: ?Hurts. ?Is red. ?Is swollen. ?You have sudden loss of vision. ?Summary ?Bacterial conjunctivitis is an infection of your conjunctiva. ?This infection spreads easily from person to person. ?Wash your hands often with soap and water for at least 20 seconds and especially before touching your face or eyes. Use paper towels to dry your hands. ?Take or apply your antibiotic medicine as told by your doctor. ?Contact a doctor if you have a fever or you do not get better after 10 days. ?This information is  not intended to replace advice given to you by your health care provider. Make sure you discuss any questions you have with your health care provider. ?Document Revised: 09/05/2020 Document Reviewed: 09/05/2020 ?Elsevier Patient Education ? [place]. ? ?

## 2021-08-20 NOTE — Progress Notes (Addendum)
Acute Office Visit  Subjective:    Patient ID: Lynn Taylor, female    DOB: [birth year], 76 y.o.   MRN: [id number]  Chief Complaint  Patient presents with   Follow-up    Pt c/o eye irritation for 2wks. Pt reports it started feeling like eye was gritty,  this was 2 weeks ago. Eye became red and runny 1 week ago, eye was crusted over some mornings. Now light sensitive, red, irritated, runny.     HPI Patient is in today for right eye irritation for 2 weeks, she has no vision, no pain, eye irritation and discharge this morning and yesterday and today and was crusty.   Denies any past eye surgeries.  Due for eye exam. Wears glasses not contacts. Denies any allergy symptoms. She has no pain in eye. She denies any visual changes. Mild photophobia. Gritty sensation has resolved.   Patient  denies any fever, body aches,chills, rash, chest pain, shortness of breath, nausea, vomiting, or diarrhea.  Denies dizziness, lightheadedness, pre syncopal or syncopal episodes.    Past Medical History:  Diagnosis Date   Anemia due to antineoplastic chemotherapy 11/25/2018   Cancer [facility])    GERD (gastroesophageal reflux disease)    Hypertension    Hypomagnesemia 11/25/2018   Hypothyroidism    Malignant neoplasm of head of pancreas (HCC) 06/08/2018   Personal history of chemotherapy 2019    Pancreatic cancer    Past Surgical History:  Procedure Laterality Date   CHOLECYSTECTOMY     COLONOSCOPY N/A 12/02/2016   Procedure: COLONOSCOPY;  Surgeon: Christena Deem, MD;  Location: [facility] ENDOSCOPY;  Service: Endoscopy;  Laterality: N/A;   ERCP N/A 04/30/2018   Procedure: ENDOSCOPIC RETROGRADE CHOLANGIOPANCREATOGRAPHY (ERCP);  Surgeon: Midge Minium, MD;  Location: [facility] ENDOSCOPY;  Service: Endoscopy;  Laterality: N/A;   ERCP N/A 05/05/2018   Procedure: ENDOSCOPIC RETROGRADE CHOLANGIOPANCREATOGRAPHY (ERCP);  Surgeon: Midge Minium, MD;  Location: [facility] ENDOSCOPY;  Service: Endoscopy;  Laterality: N/A;    PORTA CATH INSERTION N/A 06/28/2018   Procedure: PORTA CATH INSERTION;  Surgeon: Annice Needy, MD;  Location: [facility] INVASIVE CV LAB;  Service: Cardiovascular;  Laterality: N/A;    Family History  Adopted: Yes  Problem Relation Age of Onset   Diabetes Mellitus I Other    Alcoholism Other    Hypertension Other    Hyperlipidemia Other    Coronary artery disease Other    Stroke Other    Osteoarthritis Other    Migraines Other    Heart Problems Mother    Stroke Sister    CAD Brother    Stroke Brother    Breast cancer Neg Hx     Social History   Socioeconomic History   Marital status: Married    Spouse name: Not on file   Number of children: Not on file   Years of education: Not on file   Highest education level: Not on file  Occupational History   Not on file  Tobacco Use   Smoking status: Former    Types: Cigarettes    Quit date: 04/14/1988    Years since quitting: 33.3   Smokeless tobacco: Never  Vaping Use   Vaping Use: Never used  Substance and Sexual Activity   Alcohol use: Yes    Comment: social drinker   Drug use: No   Sexual activity: Not Currently  Other Topics Concern   Not on file  Social History Narrative   Lives at home married    In order  of [place] star   Social Determinants of Corporate investment banker Strain: Low Risk    Difficulty of Paying Living Expenses: Not hard at all  Food Insecurity: No Food Insecurity   Worried About Programme researcher, broadcasting/film/video in the Last Year: Never true   Barista in the Last Year: Never true  Transportation Needs: No Transportation Needs   Lack of Transportation (Medical): No   Lack of Transportation (Non-Medical): No  Physical Activity: Sufficiently Active   Days of Exercise per Week: 5 days   Minutes of Exercise per Session: 30 min  Stress: No Stress Concern Present   Feeling of Stress : Not at all  Social Connections: Unknown   Frequency of Communication with Friends and Family: Not on file   Frequency of  Social Gatherings with Friends and Family: Not on file   Attends Religious Services: Not on file   Active Member of Clubs or Organizations: Not on file   Attends Banker Meetings: Not on file   Marital Status: Married  Catering manager Violence: Not At Risk   Fear of Current or Ex-Partner: No   Emotionally Abused: No   Physically Abused: No   Sexually Abused: No    Outpatient Medications Prior to Visit  Medication Sig Dispense Refill   acetaminophen-codeine (TYLENOL #3) 300-30 MG tablet Take 1 tablet by mouth every 4 (four) hours as needed for moderate pain. 20 tablet 0   ascorbic acid (VITAMIN C) 1000 MG tablet Take by mouth.     aspirin (ASPIRIN 81) 81 MG EC tablet Take 2 tablets (162 mg total) by mouth daily. Swallow whole. 180 tablet 1   Cholecalciferol (VITAMIN D-3 PO) Take 50 mcg by mouth daily.      CREON 36000-114000 units CPEP capsule Take 36,000 Units by mouth 3 (three) times daily.     diclofenac Sodium (VOLTAREN) 1 % GEL Apply topically 4 (four) times daily.     fluticasone (FLONASE) 50 MCG/ACT nasal spray Place 2 sprays into both nostrils daily. After nasal saline 16 g 2   gabapentin (NEURONTIN) 400 MG capsule TAKE 1 CAPSULE BY MOUTH 3  TIMES DAILY 270 capsule 3   hydrochlorothiazide (HYDRODIURIL) 12.5 MG tablet TAKE 1 TABLET BY MOUTH  DAILY IN THE MORNING 90 tablet 3   hydrocortisone 2.5 % ointment Apply topically 2 (two) times daily. Prn 30 g 0   levothyroxine (SYNTHROID) 100 MCG tablet Take 1 tablet (100 mcg total) by mouth daily before breakfast. 90 tablet 3   loperamide (IMODIUM) 2 MG capsule Take 2 mg by mouth 3 (three) times daily as needed for diarrhea or loose stools.     Magnesium Cl-Calcium Carbonate (SLOW-MAG PO) Take by mouth. 2 QD     Multiple Vitamins-Minerals (CENTRUM SILVER 50+WOMEN PO) Take by mouth.     ondansetron (ZOFRAN) 4 MG tablet TAKE 1 TABLET BY MOUTH  EVERY 6 HOURS AS NEEDED FOR NAUSEA OR VOMITING. 180 tablet 1   potassium chloride  (KLOR-CON) 10 MEQ tablet TAKE 1 TABLET BY MOUTH  DAILY 90 tablet 3   Simethicone 125 MG CAPS Take by mouth.     sodium chloride (OCEAN) 0.65 % SOLN nasal spray Place 2 sprays into both nostrils daily as needed for congestion. 30 mL 2   TURMERIC CURCUMIN PO Take by mouth 3 (three) times daily with meals.     No facility-administered medications prior to visit.    Allergies  Allergen Reactions   Other  Hair dye blisters     Review of Systems  Constitutional: Negative.   HENT: Negative.    Eyes:  Positive for photophobia, discharge, redness and itching. Negative for pain and visual disturbance.  Respiratory: Negative.    Cardiovascular: Negative.   Gastrointestinal: Negative.   Genitourinary: Negative.   Neurological: Negative.   Psychiatric/Behavioral: Negative.        Objective:    Vitals:   08/20/21 0901  BP: 130/70  Pulse: 68  Temp: (!) 97.3 F (36.3 C)  SpO2: 99%    Physical Exam Vitals reviewed.  Constitutional:      Appearance: Normal appearance.  HENT:     Head: Normocephalic and atraumatic.     Salivary Glands: Right salivary gland is not diffusely enlarged or tender. Left salivary gland is not diffusely enlarged or tender.     Right Ear: Hearing, tympanic membrane, ear canal and external ear normal.     Left Ear: Hearing, tympanic membrane, ear canal and external ear normal.     Nose: Nose normal. No congestion or rhinorrhea.     Right Sinus: No maxillary sinus tenderness or frontal sinus tenderness.     Left Sinus: No maxillary sinus tenderness or frontal sinus tenderness.     Mouth/Throat:     Lips: Pink.     Mouth: Mucous membranes are moist.     Pharynx: Oropharynx is clear. No oropharyngeal exudate or posterior oropharyngeal erythema.  Eyes:     General: Lids are normal. Vision grossly intact.        Right eye: Discharge (yellow on eye lashes) present. No foreign body or hordeolum.        Left eye: No foreign body, discharge or hordeolum.      Extraocular Movements: Extraocular movements intact.     Conjunctiva/sclera:     Right eye: Right conjunctiva is injected. No chemosis, exudate or hemorrhage.    Left eye: Left conjunctiva is not injected. No chemosis, exudate or hemorrhage.    Pupils: Pupils are equal, round, and reactive to light. Pupils are equal.     Right eye: Pupil is round, reactive and not sluggish.     Left eye: Pupil is round, reactive and not sluggish.     Funduscopic exam:    Right eye: No hemorrhage or exudate. Red reflex present.        Left eye: No hemorrhage or exudate. Red reflex present. Cardiovascular:     Rate and Rhythm: Normal rate and regular rhythm.     Pulses: Normal pulses.     Heart sounds: Normal heart sounds. No murmur heard.   No friction rub. No gallop.  Pulmonary:     Effort: Pulmonary effort is normal. No respiratory distress.     Breath sounds: Normal breath sounds. No stridor. No wheezing, rhonchi or rales.  Chest:     Chest wall: No tenderness.  Abdominal:     Palpations: Abdomen is soft.    BP 130/70 (BP Location: Right Arm, Patient Position: Sitting, Cuff Size: Small)   Pulse 68   Temp (!) 97.3 F (36.3 C) (Oral)   Ht 5\' 2"  (1.575 m)   Wt 152 lb (68.9 kg)   SpO2 99%   BMI 27.80 kg/m  Wt Readings from Last 3 Encounters:  08/20/21 152 lb (68.9 kg)  05/28/21 157 lb (71.2 kg)  05/17/21 156 lb 1.9 oz (70.8 kg)    There are no preventive care reminders to display for this patient.  There are no preventive  care reminders to display for this patient.   Lab Results  Component Value Date   TSH 0.41 03/22/2020   Lab Results  Component Value Date   WBC 6.9 05/28/2021   HGB 10.4 (L) 05/28/2021   HCT 32.4 (L) 05/28/2021   MCV 88.5 05/28/2021   PLT 170 05/28/2021   Lab Results  Component Value Date   NA 133 (L) 05/28/2021   K 3.8 05/28/2021   CO2 23 05/28/2021   GLUCOSE 112 (H) 05/28/2021   BUN 14 05/28/2021   CREATININE 0.88 05/28/2021   BILITOT 0.5 05/28/2021    ALKPHOS 53 05/28/2021   AST 19 05/28/2021   ALT 15 05/28/2021   PROT 7.3 05/28/2021   ALBUMIN 4.0 05/28/2021   CALCIUM 9.2 05/28/2021   ANIONGAP 7 05/28/2021   GFR 69.16 02/26/2021   Lab Results  Component Value Date   CHOL 166 03/22/2020   Lab Results  Component Value Date   HDL 75.50 03/22/2020   Lab Results  Component Value Date   LDLCALC 76 03/22/2020   Lab Results  Component Value Date   TRIG 70.0 03/22/2020   Lab Results  Component Value Date   CHOLHDL 2 03/22/2020   Lab Results  Component Value Date   HGBA1C 6.0 09/04/2020       Assessment & Plan:   Problem List Items Addressed This Visit       Other   Bacterial conjunctivitis of right eye - Primary   Relevant Medications   ofloxacin (OCUFLOX) 0.3 % ophthalmic solution   Other Relevant Orders   Ambulatory referral to Ophthalmology   Eye irritation   Relevant Orders   Ambulatory referral to Ophthalmology     Meds ordered this encounter  Medications   ofloxacin (OCUFLOX) 0.3 % ophthalmic solution    Sig: 1-2 drops in right eye every 4 hours for 2 days and then 1- 2 drops QID for 5 days in right eye.    Dispense:  5 mL    Refill:  0   Recommend opthalmology evaluation as well, overdue for eye exam.  RED flags of  eye symptoms discussed and strict when to see opthalmology discussed.   Return if symptoms worsen or fail to improve, for at any time for any worsening symptoms, Go to Emergency room/ urgent care if worse.   Red Flags discussed. The patient was given clear instructions to go to ER or return to medical center if any red flags develop, symptoms do not improve, worsen or new problems develop. They verbalized understanding.  Jairo Ben, FNP

## 2021-08-21 ENCOUNTER — Encounter: Payer: Self-pay | Admitting: Adult Health

## 2021-08-21 DIAGNOSIS — H5789 Other specified disorders of eye and adnexa: Secondary | ICD-10-CM

## 2021-08-21 DIAGNOSIS — H109 Unspecified conjunctivitis: Secondary | ICD-10-CM

## 2021-08-21 HISTORY — DX: Unspecified conjunctivitis: H10.9

## 2021-08-21 HISTORY — DX: Other specified disorders of eye and adnexa: H57.89

## 2021-08-22 DIAGNOSIS — H2513 Age-related nuclear cataract, bilateral: Secondary | ICD-10-CM | POA: Diagnosis not present

## 2021-09-16 ENCOUNTER — Encounter: Payer: Self-pay | Admitting: Internal Medicine

## 2021-09-18 ENCOUNTER — Inpatient Hospital Stay: Payer: Medicare Other | Attending: Oncology

## 2021-09-18 DIAGNOSIS — Z95828 Presence of other vascular implants and grafts: Secondary | ICD-10-CM

## 2021-09-18 DIAGNOSIS — Z452 Encounter for adjustment and management of vascular access device: Secondary | ICD-10-CM | POA: Diagnosis not present

## 2021-09-18 DIAGNOSIS — C250 Malignant neoplasm of head of pancreas: Secondary | ICD-10-CM | POA: Diagnosis not present

## 2021-09-18 MED ORDER — HEPARIN SOD (PORK) LOCK FLUSH 100 UNIT/ML IV SOLN
500.0000 [IU] | Freq: Once | INTRAVENOUS | Status: AC
  Administered 2021-09-18: 500 [IU] via INTRAVENOUS
  Filled 2021-09-18: qty 5

## 2021-09-18 MED ORDER — SODIUM CHLORIDE 0.9% FLUSH
10.0000 mL | Freq: Once | INTRAVENOUS | Status: AC
  Administered 2021-09-18: 10 mL via INTRAVENOUS
  Filled 2021-09-18: qty 10

## 2021-09-30 DIAGNOSIS — H40003 Preglaucoma, unspecified, bilateral: Secondary | ICD-10-CM | POA: Diagnosis not present

## 2021-09-30 DIAGNOSIS — H35371 Puckering of macula, right eye: Secondary | ICD-10-CM | POA: Diagnosis not present

## 2021-09-30 DIAGNOSIS — H2513 Age-related nuclear cataract, bilateral: Secondary | ICD-10-CM | POA: Diagnosis not present

## 2021-09-30 DIAGNOSIS — M3501 Sicca syndrome with keratoconjunctivitis: Secondary | ICD-10-CM | POA: Diagnosis not present

## 2021-10-31 DIAGNOSIS — H2511 Age-related nuclear cataract, right eye: Secondary | ICD-10-CM | POA: Diagnosis not present

## 2021-11-06 ENCOUNTER — Encounter: Payer: Self-pay | Admitting: Internal Medicine

## 2021-11-13 ENCOUNTER — Inpatient Hospital Stay: Payer: Medicare Other

## 2021-11-15 ENCOUNTER — Encounter: Payer: Self-pay | Admitting: Internal Medicine

## 2021-11-15 ENCOUNTER — Ambulatory Visit (INDEPENDENT_AMBULATORY_CARE_PROVIDER_SITE_OTHER): Payer: Medicare Other | Admitting: Internal Medicine

## 2021-11-15 VITALS — BP 120/60 | HR 66 | Temp 97.9°F | Resp 14 | Ht 62.0 in | Wt 150.6 lb

## 2021-11-15 DIAGNOSIS — R7303 Prediabetes: Secondary | ICD-10-CM

## 2021-11-15 DIAGNOSIS — I10 Essential (primary) hypertension: Secondary | ICD-10-CM

## 2021-11-15 DIAGNOSIS — E039 Hypothyroidism, unspecified: Secondary | ICD-10-CM | POA: Diagnosis not present

## 2021-11-15 DIAGNOSIS — Z1231 Encounter for screening mammogram for malignant neoplasm of breast: Secondary | ICD-10-CM

## 2021-11-15 DIAGNOSIS — J321 Chronic frontal sinusitis: Secondary | ICD-10-CM | POA: Diagnosis not present

## 2021-11-15 DIAGNOSIS — E559 Vitamin D deficiency, unspecified: Secondary | ICD-10-CM | POA: Diagnosis not present

## 2021-11-15 DIAGNOSIS — Z23 Encounter for immunization: Secondary | ICD-10-CM

## 2021-11-15 DIAGNOSIS — Z1389 Encounter for screening for other disorder: Secondary | ICD-10-CM | POA: Diagnosis not present

## 2021-11-15 DIAGNOSIS — Z9189 Other specified personal risk factors, not elsewhere classified: Secondary | ICD-10-CM | POA: Diagnosis not present

## 2021-11-15 LAB — VITAMIN D 25 HYDROXY (VIT D DEFICIENCY, FRACTURES): VITD: 27.93 ng/mL — ABNORMAL LOW (ref 30.00–100.00)

## 2021-11-15 LAB — LIPID PANEL
Cholesterol: 169 mg/dL (ref 0–200)
HDL: 73.9 mg/dL (ref >39.00)
LDL Cholesterol: 77 mg/dL (ref 0–99)
NonHDL: 95.09
Total CHOL/HDL Ratio: 2
Triglycerides: 90 mg/dL (ref 0.0–149.0)
VLDL: 18 mg/dL (ref 0.0–40.0)

## 2021-11-15 LAB — HEMOGLOBIN A1C: Hgb A1c MFr Bld: 6 % (ref 4.6–6.5)

## 2021-11-15 LAB — TSH: TSH: 1.39 u[IU]/mL (ref 0.35–5.50)

## 2021-11-15 MED ORDER — FLUTICASONE PROPIONATE 50 MCG/ACT NA SUSP: 2.0000 | Freq: Every day | NASAL | 3 refills | Status: DC

## 2021-11-15 MED ORDER — SHINGRIX 50 MCG/0.5ML IM SUSR: 0.5000 mL | Freq: Once | INTRAMUSCULAR | 1 refills | Status: AC

## 2021-11-15 MED ORDER — LEVOTHYROXINE SODIUM 100 MCG PO TABS: 100.0000 ug | ORAL_TABLET | Freq: Every day | ORAL | 3 refills | Status: DC

## 2021-11-15 MED ORDER — TETANUS-DIPHTH-ACELL PERTUSSIS 5-2.5-18.5 LF-MCG/0.5 IM SUSP: 0.5000 mL | Freq: Once | INTRAMUSCULAR | 0 refills | Status: AC

## 2021-11-15 NOTE — Patient Instructions (Addendum)
Prevnar 20 due 05/17/22   Dr. Volanda Napoleon will be here 12/2021  Call back to schedule appt in 6 months with her starting 12/2021   Pneumococcal Conjugate Vaccine (Prevnar 20) Suspension for Injection What is this medication? PNEUMOCOCCAL VACCINE (NEU mo KOK al vak SEEN) is a vaccine. It prevents pneumococcus bacterial infections. These bacteria can cause serious infections like pneumonia, meningitis, and blood infections. This vaccine will not treat an infection and will not cause infection. This vaccine is recommended for adults 18 years and older. This medicine may be used for other purposes; ask your health care provider or pharmacist if you have questions. COMMON BRAND NAME(S): Prevnar 20 What should I tell my care team before I take this medication? They need to know if you have any of these conditions: bleeding disorder fever immune system problems an unusual or allergic reaction to pneumococcal vaccine, diphtheria toxoid, other vaccines, other medicines, foods, dyes, or preservatives pregnant or trying to get pregnant breast-feeding How should I use this medication? This vaccine is injected into a muscle. It is given by a health care provider. A copy of Vaccine Information Statements will be given before each vaccination. Be sure to read this information carefully each time. This sheet may change often. Talk to your health care provider about the use of this medicine in children. Special care may be needed. Overdosage: If you think you have taken too much of this medicine contact a poison control center or emergency room at once. NOTE: This medicine is only for you. Do not share this medicine with others. What if I miss a dose? This does not apply. This medicine is not for regular use. What may interact with this medication? medicines for cancer chemotherapy medicines that suppress your immune function steroid medicines like prednisone or cortisone This list may not describe all possible  interactions. Give your health care provider a list of all the medicines, herbs, non-prescription drugs, or dietary supplements you use. Also tell them if you smoke, drink alcohol, or use illegal drugs. Some items may interact with your medicine. What should I watch for while using this medication? Mild fever and pain should go away in 3 days or less. Report any unusual symptoms to your health care provider. What side effects may I notice from receiving this medication? Side effects that you should report to your doctor or health care professional as soon as possible: allergic reactions (skin rash, itching or hives; swelling of the face, lips, or tongue) confusion fast, irregular heartbeat fever over 102 degrees F muscle weakness seizures trouble breathing unusual bruising or bleeding Side effects that usually do not require medical attention (report to your doctor or health care professional if they continue or are bothersome): fever of 102 degrees F or less headache joint pain muscle cramps, pain pain, tender at site where injected This list may not describe all possible side effects. Call your doctor for medical advice about side effects. You may report side effects to FDA at 1-800-FDA-1088. Where should I keep my medication? This vaccine is only given by a health care provider. It will not be stored at home. NOTE: This sheet is a summary. It may not cover all possible information. If you have questions about this medicine, talk to your doctor, pharmacist, or health care provider.  2023 Elsevier/Gold Standard (2020-02-09 00:00:00)   Zoster Vaccine, Recombinant injection What is this medication? ZOSTER VACCINE (ZOS ter vak SEEN) is a vaccine used to reduce the risk of getting shingles. This vaccine  is not used to treat shingles or nerve pain from shingles. This medicine may be used for other purposes; ask your health care provider or pharmacist if you have questions. COMMON BRAND  NAME(S): [facility] What should I tell my care team before I take this medication? They need to know if you have any of these conditions: cancer immune system problems an unusual or allergic reaction to Zoster vaccine, other medications, foods, dyes, or preservatives pregnant or trying to get pregnant breast-feeding How should I use this medication? This vaccine is injected into a muscle. It is given by a health care provider. A copy of Vaccine Information Statements will be given before each vaccination. Be sure to read this information carefully each time. This sheet may change often. Talk to your health care provider about the use of this vaccine in children. This vaccine is not approved for use in children. Overdosage: If you think you have taken too much of this medicine contact a poison control center or emergency room at once. NOTE: This medicine is only for you. Do not share this medicine with others. What if I miss a dose? Keep appointments for follow-up (booster) doses. It is important not to miss your dose. Call your health care provider if you are unable to keep an appointment. What may interact with this medication? medicines that suppress your immune system medicines to treat cancer steroid medicines like prednisone or cortisone This list may not describe all possible interactions. Give your health care provider a list of all the medicines, herbs, non-prescription drugs, or dietary supplements you use. Also tell them if you smoke, drink alcohol, or use illegal drugs. Some items may interact with your medicine. What should I watch for while using this medication? Visit your health care provider regularly. This vaccine, like all vaccines, may not fully protect everyone. What side effects may I notice from receiving this medication? Side effects that you should report to your doctor or health care professional as soon as possible: allergic reactions (skin rash, itching or hives;  swelling of the face, lips, or tongue) trouble breathing Side effects that usually do not require medical attention (report these to your doctor or health care professional if they continue or are bothersome): chills headache fever nausea pain, redness, or irritation at site where injected tiredness vomiting This list may not describe all possible side effects. Call your doctor for medical advice about side effects. You may report side effects to FDA at 1-800-FDA-1088. Where should I keep my medication? This vaccine is only given by a health care provider. It will not be stored at home. NOTE: This sheet is a summary. It may not cover all possible information. If you have questions about this medicine, talk to your doctor, pharmacist, or health care provider.  2023 Elsevier/Gold Standard (2021-04-26 00:00:00)  Tdap (Tetanus, Diphtheria, Pertussis) Vaccine: What You Need to Know 1. Why get vaccinated? Tdap vaccine can prevent tetanus, diphtheria, and pertussis. Diphtheria and pertussis spread from person to person. Tetanus enters the body through cuts or wounds. TETANUS (T) causes painful stiffening of the muscles. Tetanus can lead to serious health problems, including being unable to open the mouth, having trouble swallowing and breathing, or death. DIPHTHERIA (D) can lead to difficulty breathing, heart failure, paralysis, or death. PERTUSSIS (aP), also known as "whooping cough," can cause uncontrollable, violent coughing that makes it hard to breathe, eat, or drink. Pertussis can be extremely serious especially in babies and young children, causing pneumonia, convulsions, brain damage, or  death. In teens and adults, it can cause weight loss, loss of bladder control, passing out, and rib fractures from severe coughing. 2. Tdap vaccine Tdap is only for children 7 years and older, adolescents, and adults.  Adolescents should receive a single dose of Tdap, preferably at age 14 or 67  years. Pregnant people should get a dose of Tdap during every pregnancy, preferably during the early part of the third trimester, to help protect the newborn from pertussis. Infants are most at risk for severe, life-threatening complications from pertussis. Adults who have never received Tdap should get a dose of Tdap. Also, adults should receive a booster dose of either Tdap or Td (a different vaccine that protects against tetanus and diphtheria but not pertussis) every 10 years, or after 5 years in the case of a severe or dirty wound or burn. Tdap may be given at the same time as other vaccines. 3. Talk with your health care provider Tell your vaccine provider if the person getting the vaccine: Has had an allergic reaction after a previous dose of any vaccine that protects against tetanus, diphtheria, or pertussis, or has any severe, life-threatening allergies Has had a coma, decreased level of consciousness, or prolonged seizures within 7 days after a previous dose of any pertussis vaccine (DTP, DTaP, or Tdap) Has seizures or another nervous system problem Has ever had Guillain-Barr Syndrome (also called "GBS") Has had severe pain or swelling after a previous dose of any vaccine that protects against tetanus or diphtheria In some cases, your health care provider may decide to postpone Tdap vaccination until a future visit. People with minor illnesses, such as a cold, may be vaccinated. People who are moderately or severely ill should usually wait until they recover before getting Tdap vaccine.  Your health care provider can give you more information. 4. Risks of a vaccine reaction Pain, redness, or swelling where the shot was given, mild fever, headache, feeling tired, and nausea, vomiting, diarrhea, or stomachache sometimes happen after Tdap vaccination. People sometimes faint after medical procedures, including vaccination. Tell your provider if you feel dizzy or have vision changes or ringing  in the ears.  As with any medicine, there is a very remote chance of a vaccine causing a severe allergic reaction, other serious injury, or death. 5. What if there is a serious problem? An allergic reaction could occur after the vaccinated person leaves the clinic. If you see signs of a severe allergic reaction (hives, swelling of the face and throat, difficulty breathing, a fast heartbeat, dizziness, or weakness), call 9-1-1 and get the person to the nearest hospital. For other signs that concern you, call your health care provider.  Adverse reactions should be reported to the Vaccine Adverse Event Reporting System (VAERS). Your health care provider will usually file this report, or you can do it yourself. Visit the VAERS website at www.vaers.SamedayNews.es or call 931 605 8382. VAERS is only for reporting reactions, and VAERS staff members do not give medical advice. 6. The National Vaccine Injury Compensation Program The [employer] Vaccine Injury Compensation Program (VICP) is a federal program that was created to compensate people who may have been injured by certain vaccines. Claims regarding alleged injury or death due to vaccination have a time limit for filing, which may be as short as two years. Visit the VICP website at GoldCloset.com.ee or call 727-790-4916 to learn about the program and about filing a claim. 7. How can I learn more? Ask your health care provider. Call your local  or state health department. Visit the website of the Food and Drug Administration (FDA) for vaccine package inserts and additional information at TraderRating.uy. Contact the Centers for Disease Control and Prevention (CDC): Call 857-230-7296 (1-800-CDC-INFO) or Visit CDC's website at http://hunter.com/. Source: CDC Vaccine Information Statement Tdap (Tetanus, Diphtheria, Pertussis) Vaccine (01/13/2020) This same material is available at http://www.wolf.info/ for no charge. This  information is not intended to replace advice given to you by your health care provider. Make sure you discuss any questions you have with your health care provider. Document Revised: 04/24/2021 Document Reviewed: 02/25/2021 Elsevier Patient Education  [place].

## 2021-11-15 NOTE — Progress Notes (Signed)
Chief Complaint  Patient presents with   Follow-up    6 mon, disc about need of pneumo vaccine, denies any pain.    F/u  1. Htn controlled htz 12.5 mg qd  2. Hypothyroidism on levo 100 mcg qd doing well no complaints 3. Refills of flonase and levo   Review of Systems  Constitutional:  Negative for weight loss.  HENT:  Negative for hearing loss.   Eyes:  Negative for blurred vision.  Respiratory:  Negative for shortness of breath.   Cardiovascular:  Negative for chest pain.  Gastrointestinal:  Negative for abdominal pain and blood in stool.  Genitourinary:  Negative for dysuria.  Musculoskeletal:  Negative for falls and joint pain.  Skin:  Negative for rash.  Neurological:  Negative for headaches.  Psychiatric/Behavioral:  Negative for depression.    Past Medical History:  Diagnosis Date   Anemia due to antineoplastic chemotherapy 11/25/2018   Cancer [facility])    GERD (gastroesophageal reflux disease)    Hypertension    Hypomagnesemia 11/25/2018   Hypothyroidism    Malignant neoplasm of head of pancreas ([place]) 06/08/2018   Personal history of chemotherapy 2019    Pancreatic cancer   Past Surgical History:  Procedure Laterality Date   CHOLECYSTECTOMY     COLONOSCOPY N/A 12/02/2016   Procedure: COLONOSCOPY;  Surgeon: Lollie Sails, MD;  Location: [facility] ENDOSCOPY;  Service: Endoscopy;  Laterality: N/A;   ERCP N/A 04/30/2018   Procedure: ENDOSCOPIC RETROGRADE CHOLANGIOPANCREATOGRAPHY (ERCP);  Surgeon: Lucilla Lame, MD;  Location: [facility] ENDOSCOPY;  Service: Endoscopy;  Laterality: N/A;   ERCP N/A 05/05/2018   Procedure: ENDOSCOPIC RETROGRADE CHOLANGIOPANCREATOGRAPHY (ERCP);  Surgeon: Lucilla Lame, MD;  Location: [facility] ENDOSCOPY;  Service: Endoscopy;  Laterality: N/A;   PORTA CATH INSERTION N/A 06/28/2018   Procedure: PORTA CATH INSERTION;  Surgeon: Algernon Huxley, MD;  Location: [facility] CV LAB;  Service: Cardiovascular;  Laterality: N/A;   Family History  Adopted: Yes   Problem Relation Age of Onset   Diabetes Mellitus I Other    Alcoholism Other    Hypertension Other    Hyperlipidemia Other    Coronary artery disease Other    Stroke Other    Osteoarthritis Other    Migraines Other    Heart Problems Mother    Stroke Sister    CAD Brother    Stroke Brother    Breast cancer Neg Hx    Social History   Socioeconomic History   Marital status: Married    Spouse name: Not on file   Number of children: Not on file   Years of education: Not on file   Highest education level: Not on file  Occupational History   Not on file  Tobacco Use   Smoking status: Former    Types: Cigarettes    Quit date: 04/14/1988    Years since quitting: 33.6   Smokeless tobacco: Never  Vaping Use   Vaping Use: Never used  Substance and Sexual Activity   Alcohol use: Yes    Comment: social drinker   Drug use: No   Sexual activity: Not Currently  Other Topics Concern   Not on file  Social History Narrative   Lives at home married    In order of [place] star   Social Determinants of Health   Financial Resource Strain: [place]  (02/05/2021)   Overall Financial Resource Strain (CARDIA)    Difficulty of Paying Living Expenses: Not hard at all  [place]: No [place] (  02/05/2021)   Hunger Vital Sign    Worried About Running Out of Food in the Last Year: Never true    [place] in the Last Year: Never true  Transportation Needs: No Transportation Needs (02/05/2021)   PRAPARE - Hydrologist (Medical): No    Lack of Transportation (Non-Medical): No  Physical Activity: Sufficiently Active (02/05/2021)   Exercise Vital Sign    Days of Exercise per Week: 5 days    Minutes of Exercise per Session: 30 min  Stress: No Stress Concern Present (02/05/2021)   [place]    Feeling of Stress : Not at all  Social Connections: Unknown (02/05/2021)   Social Connection  and Isolation Panel [NHANES]    Frequency of Communication with Friends and Family: Not on file    Frequency of Social Gatherings with Friends and Family: Not on file    Attends Religious Services: Not on file    Active Member of Clubs or Organizations: Not on file    Attends Archivist Meetings: Not on file    Marital Status: Married  Intimate Partner Violence: Not At Risk (02/05/2021)   Humiliation, Afraid, Rape, and Kick questionnaire    Fear of Current or Ex-Partner: No    Emotionally Abused: No    Physically Abused: No    Sexually Abused: No   Current Meds  Medication Sig   acetaminophen-codeine (TYLENOL #3) 300-30 MG tablet Take 1 tablet by mouth every 4 (four) hours as needed for moderate pain.   ascorbic acid (VITAMIN C) 1000 MG tablet Take by mouth.   aspirin (ASPIRIN 81) 81 MG EC tablet Take 2 tablets (162 mg total) by mouth daily. Swallow whole.   Cholecalciferol (VITAMIN D-3 PO) Take 50 mcg by mouth daily.    CREON 36000-114000 units CPEP capsule Take 36,000 Units by mouth 3 (three) times daily.   diclofenac Sodium (VOLTAREN) 1 % GEL Apply topically 4 (four) times daily.   gabapentin (NEURONTIN) 400 MG capsule TAKE 1 CAPSULE BY MOUTH 3  TIMES DAILY   hydrochlorothiazide (HYDRODIURIL) 12.5 MG tablet TAKE 1 TABLET BY MOUTH  DAILY IN THE MORNING   loperamide (IMODIUM) 2 MG capsule Take 2 mg by mouth 3 (three) times daily as needed for diarrhea or loose stools.   Magnesium Cl-Calcium Carbonate (SLOW-MAG PO) Take by mouth. 2 QD   Multiple Vitamins-Minerals (CENTRUM SILVER 50+WOMEN PO) Take by mouth.   potassium chloride (KLOR-CON) 10 MEQ tablet TAKE 1 TABLET BY MOUTH  DAILY   Simethicone 125 MG CAPS Take by mouth.   sodium chloride (OCEAN) 0.65 % SOLN nasal spray Place 2 sprays into both nostrils daily as needed for congestion.   Tdap (BOOSTRIX) 5-2.5-18.5 LF-MCG/0.5 injection Inject 0.5 mLs into the muscle once for 1 dose.   TURMERIC CURCUMIN PO Take by mouth 3 (three)  times daily with meals.   Zoster Vaccine Adjuvanted [facility]) injection Inject 0.5 mLs into the muscle once for 1 dose.   [DISCONTINUED] fluticasone (FLONASE) 50 MCG/ACT nasal spray Place 2 sprays into both nostrils daily. After nasal saline   [DISCONTINUED] levothyroxine (SYNTHROID) 100 MCG tablet Take 1 tablet (100 mcg total) by mouth daily before breakfast.   Allergies  Allergen Reactions   Other     Hair dye blisters    No results found for this or any previous visit (from the past 2160 hour(s)). Objective  Body mass index is 27.55 kg/m. Wt  Readings from Last 3 Encounters:  11/15/21 150 lb 9.6 oz (68.3 kg)  08/20/21 152 lb (68.9 kg)  05/28/21 157 lb (71.2 kg)   Temp Readings from Last 3 Encounters:  11/15/21 97.9 F (36.6 C) (Oral)  08/20/21 (!) 97.3 F (36.3 C) (Oral)  05/28/21 98.3 F (36.8 C)   BP Readings from Last 3 Encounters:  11/15/21 120/60  08/20/21 130/70  05/28/21 (!) 117/56   Pulse Readings from Last 3 Encounters:  11/15/21 66  08/20/21 68  05/28/21 67    Physical Exam Vitals and nursing note reviewed.  Constitutional:      Appearance: Normal appearance. She is well-developed and well-groomed.  HENT:     Head: Normocephalic and atraumatic.  Eyes:     Conjunctiva/sclera: Conjunctivae normal.     Pupils: Pupils are equal, round, and reactive to light.  Cardiovascular:     Rate and Rhythm: Normal rate and regular rhythm.     Heart sounds: Normal heart sounds. No murmur heard. Pulmonary:     Effort: Pulmonary effort is normal.     Breath sounds: Normal breath sounds.  Abdominal:     General: Abdomen is flat. Bowel sounds are normal.     Tenderness: There is no abdominal tenderness.  Musculoskeletal:        General: No tenderness.  Skin:    General: Skin is warm and dry.  Neurological:     General: No focal deficit present.     Mental Status: She is alert and oriented to person, place, and time. Mental status is at baseline.     Cranial  Nerves: Cranial nerves 2-12 are intact.     Motor: Motor function is intact.     Coordination: Coordination is intact.     Gait: Gait is intact.  Psychiatric:        Attention and Perception: Attention and perception normal.        Mood and Affect: Mood and affect normal.        Speech: Speech normal.        Behavior: Behavior normal. Behavior is cooperative.        Thought Content: Thought content normal.        Cognition and Memory: Cognition and memory normal.        Judgment: Judgment normal.     Assessment  Plan  Prediabetes - Plan: Hemoglobin A1c  Essential hypertension - Plan: Lipid panel Controlled hctz 12.5 mg qd   Vitamin D deficiency, check lab today   Hypothyroidism, unspecified type - Plan: levothyroxine (SYNTHROID) 100 MCG tablet  Frontal sinusitis, unspecified chronicity - Plan: fluticasone (FLONASE) 50 MCG/ACT nasal spray   HM Flu shot declines Prevnar given utd consider pna 20 1 year 05/17/22 Tdap, shingrix declines  [employer] 5/5    mammo 08/13/21 negative ordered Colonoscopy 12/02/16 sessile polyp and tubular adenoma [facility] f/u in 10 years    CT ab/pelvis given history had 02/18/21 [facility]   No pap due dexa 07/27/15 normal  rec centrum silver or nature made mvt with iron  Provider: Dr. Olivia Mackie McLean-Scocuzza-Internal Medicine

## 2021-11-16 LAB — URINALYSIS, ROUTINE W REFLEX MICROSCOPIC
Bacteria, UA: NONE SEEN /HPF
Bilirubin Urine: NEGATIVE
Glucose, UA: NEGATIVE
Hgb urine dipstick: NEGATIVE
Hyaline Cast: NONE SEEN /LPF
Ketones, ur: NEGATIVE
Nitrite: NEGATIVE
Protein, ur: NEGATIVE
RBC / HPF: NONE SEEN /HPF (ref 0–2)
Specific Gravity, Urine: 1.017 (ref 1.001–1.035)
pH: <=5 (ref 5.0–8.0)

## 2021-11-16 LAB — MICROSCOPIC MESSAGE

## 2021-11-18 DIAGNOSIS — R5383 Other fatigue: Secondary | ICD-10-CM | POA: Diagnosis not present

## 2021-11-18 DIAGNOSIS — Z8507 Personal history of malignant neoplasm of pancreas: Secondary | ICD-10-CM | POA: Diagnosis not present

## 2021-11-18 DIAGNOSIS — R634 Abnormal weight loss: Secondary | ICD-10-CM | POA: Diagnosis not present

## 2021-11-18 DIAGNOSIS — C250 Malignant neoplasm of head of pancreas: Secondary | ICD-10-CM | POA: Diagnosis not present

## 2021-11-18 DIAGNOSIS — Z08 Encounter for follow-up examination after completed treatment for malignant neoplasm: Secondary | ICD-10-CM | POA: Diagnosis not present

## 2021-11-21 ENCOUNTER — Encounter: Payer: Self-pay | Admitting: Ophthalmology

## 2021-11-22 NOTE — Discharge Instructions (Signed)

## 2021-11-27 ENCOUNTER — Other Ambulatory Visit: Payer: Self-pay

## 2021-11-27 ENCOUNTER — Encounter: Payer: Self-pay | Admitting: Ophthalmology

## 2021-11-27 ENCOUNTER — Ambulatory Visit: Payer: Medicare Other | Admitting: Anesthesiology

## 2021-11-27 ENCOUNTER — Ambulatory Visit
Admission: RE | Admit: 2021-11-27 | Discharge: 2021-11-27 | Disposition: A | Discharge status: Home / Self Care | Payer: Medicare Other | Source: Ambulatory Visit | Attending: Ophthalmology | Admitting: Ophthalmology

## 2021-11-27 ENCOUNTER — Encounter
Admission: RE | Disposition: A | Discharge status: Home / Self Care | Payer: Self-pay | Source: Ambulatory Visit | Attending: Ophthalmology

## 2021-11-27 DIAGNOSIS — Z8507 Personal history of malignant neoplasm of pancreas: Secondary | ICD-10-CM | POA: Insufficient documentation

## 2021-11-27 DIAGNOSIS — J449 Chronic obstructive pulmonary disease, unspecified: Secondary | ICD-10-CM | POA: Diagnosis not present

## 2021-11-27 DIAGNOSIS — I10 Essential (primary) hypertension: Secondary | ICD-10-CM | POA: Diagnosis not present

## 2021-11-27 DIAGNOSIS — E039 Hypothyroidism, unspecified: Secondary | ICD-10-CM | POA: Insufficient documentation

## 2021-11-27 DIAGNOSIS — H25811 Combined forms of age-related cataract, right eye: Secondary | ICD-10-CM | POA: Diagnosis not present

## 2021-11-27 DIAGNOSIS — Z87891 Personal history of nicotine dependence: Secondary | ICD-10-CM | POA: Insufficient documentation

## 2021-11-27 DIAGNOSIS — H2511 Age-related nuclear cataract, right eye: Secondary | ICD-10-CM | POA: Insufficient documentation

## 2021-11-27 HISTORY — DX: Presence of dental prosthetic device (complete) (partial): Z97.2

## 2021-11-27 SURGERY — PHACOEMULSIFICATION, CATARACT, WITH IOL INSERTION
Anesthesia: Monitor Anesthesia Care | Laterality: Right

## 2021-11-27 MED ORDER — ACETAMINOPHEN 160 MG/5ML PO SOLN: 325.0000 mg | ORAL | Status: DC | PRN

## 2021-11-27 MED ORDER — MOXIFLOXACIN HCL 0.5 % OP SOLN
OPHTHALMIC | Status: DC | PRN
  Administered 2021-11-27: 0.2 mL via OPHTHALMIC

## 2021-11-27 MED ORDER — LIDOCAINE HCL (PF) 2 % IJ SOLN
INTRAOCULAR | Status: DC | PRN
  Administered 2021-11-27: 1 mL via INTRAOCULAR

## 2021-11-27 MED ORDER — SIGHTPATH DOSE#1 BSS IO SOLN
INTRAOCULAR | Status: DC | PRN
  Administered 2021-11-27: 15 mL

## 2021-11-27 MED ORDER — TETRACAINE HCL 0.5 % OP SOLN
1.0000 [drp] | OPHTHALMIC | Status: DC | PRN
  Administered 2021-11-27 (×3): 1 [drp] via OPHTHALMIC

## 2021-11-27 MED ORDER — SIGHTPATH DOSE#1 NA HYALUR & NA CHOND-NA HYALUR IO KIT
PACK | INTRAOCULAR | Status: DC | PRN
  Administered 2021-11-27: 1 via OPHTHALMIC

## 2021-11-27 MED ORDER — ARMC OPHTHALMIC DILATING DROPS: OPHTHALMIC | Status: DC | PRN

## 2021-11-27 MED ORDER — BRIMONIDINE TARTRATE-TIMOLOL 0.2-0.5 % OP SOLN
OPHTHALMIC | Status: DC | PRN
  Administered 2021-11-27: 1 [drp] via OPHTHALMIC

## 2021-11-27 MED ORDER — ONDANSETRON HCL 4 MG/2ML IJ SOLN: 4.0000 mg | Freq: Once | INTRAMUSCULAR | Status: DC | PRN

## 2021-11-27 MED ORDER — ACETAMINOPHEN 325 MG PO TABS: 325.0000 mg | ORAL_TABLET | ORAL | Status: DC | PRN

## 2021-11-27 MED ORDER — FENTANYL CITRATE (PF) 100 MCG/2ML IJ SOLN
INTRAMUSCULAR | Status: DC | PRN
  Administered 2021-11-27: 50 ug via INTRAVENOUS

## 2021-11-27 MED ORDER — PHENYLEPHRINE-KETOROLAC 1-0.3 % IO SOLN
INTRAOCULAR | Status: DC | PRN
  Administered 2021-11-27: 72 mL via OPHTHALMIC

## 2021-11-27 MED ORDER — TRYPAN BLUE 0.06 % IO SOSY
PREFILLED_SYRINGE | INTRAOCULAR | Status: DC | PRN
  Administered 2021-11-27: .1 mL via INTRAOCULAR

## 2021-11-27 MED ORDER — MIDAZOLAM HCL 2 MG/2ML IJ SOLN
INTRAMUSCULAR | Status: DC | PRN
  Administered 2021-11-27: 1 mg via INTRAVENOUS

## 2021-11-27 SURGICAL SUPPLY — 17 items
CANNULA ANT/CHMB 27G (MISCELLANEOUS) IMPLANT
CANNULA ANT/CHMB 27GA (MISCELLANEOUS) ×2 IMPLANT
CATARACT SUITE SIGHTPATH (MISCELLANEOUS) ×2 IMPLANT
DISSECTOR HYDRO NUCLEUS 50X22 (MISCELLANEOUS) ×2 IMPLANT
DRSG TEGADERM 2-3/8X2-3/4 SM (GAUZE/BANDAGES/DRESSINGS) ×2 IMPLANT
FEE CATARACT SUITE SIGHTPATH (MISCELLANEOUS) ×1 IMPLANT
GLOVE SURG GAMMEX PI TX LF 7.5 (GLOVE) ×2 IMPLANT
GLOVE SURG SYN 8.5  E (GLOVE) ×1
GLOVE SURG SYN 8.5 E (GLOVE) ×1 IMPLANT
GLOVE SURG SYN 8.5 PF PI (GLOVE) ×1 IMPLANT
LENS IOL TECNIS EYHANCE 20.5 (Intraocular Lens) ×1 IMPLANT
NDL FILTER BLUNT 18X1 1/2 (NEEDLE) ×1 IMPLANT
NEEDLE FILTER BLUNT 18X 1/2SAF (NEEDLE) ×1
NEEDLE FILTER BLUNT 18X1 1/2 (NEEDLE) ×1 IMPLANT
SYR 3ML LL SCALE MARK (SYRINGE) ×2 IMPLANT
SYR 5ML LL (SYRINGE) ×2 IMPLANT
WATER STERILE IRR 250ML POUR (IV SOLUTION) ×2 IMPLANT

## 2021-11-27 NOTE — Op Note (Addendum)
OPERATIVE NOTE  LESHAY DESAULNIERS [id number] 11/27/2021   PREOPERATIVE DIAGNOSIS: Nuclear sclerotic cataract right eye. H25.11   POSTOPERATIVE DIAGNOSIS: Nuclear sclerotic cataract right eye. H25.11   PROCEDURE:  Phacoemusification with posterior chamber intraocular lens placement of the right eye  Ultrasound time: Procedure(s): CATARACT EXTRACTION PHACO AND INTRAOCULAR LENS PLACEMENT (IOC) RIGHT VISION BLUE, OMIDRIA  7.91 00:48.1 (Right)  LENS:   Implant Name Type Inv. Item Serial No. Manufacturer Lot No. LRB No. Used Action  LENS IOL TECNIS EYHANCE 20.5 - [id number] Intraocular Lens LENS IOL TECNIS EYHANCE 20.5 [id number] [employer]  Right 1 Implanted      SURGEON:  Courtney Heys. Lazarus Salines, MD   ANESTHESIA:  Topical with tetracaine drops, augmented with 1% preservative-free intracameral lidocaine.   COMPLICATIONS:  None.   DESCRIPTION OF PROCEDURE:  The patient was identified in the holding room and transported to the operating room and placed in the supine position under the operating microscope.  The right eye was identified as the operative eye, which was prepped and draped in the usual sterile ophthalmic fashion.   A 1 millimeter clear-corneal paracentesis was made superotemporally. Preservative-free 1% lidocaine mixed with 1:1,000 bisulfite-free aqueous solution of epinephrine was injected into the anterior chamber. The red reflex was poor due to dense cortical changes, so trypan blue was injected into the anterior chamber to stain the capsule and facilitate visualization during creation of the capsulorhexis. The anterior chamber was then filled with Viscoat viscoelastic. A 2.4 millimeter keratome was used to make a clear-corneal incision inferotemporally. A curvilinear capsulorrhexis was made with a cystotome and capsulorrhexis forceps. Balanced salt solution was used to hydrodissect and hydrodelineate the nucleus. Phacoemulsification was then used to remove the lens nucleus and epinucleus.  The remaining cortex was then removed using the irrigation and aspiration handpiece. Provisc was then placed into the capsular bag to distend it for lens placement. A +20.50 intraocular DIB00 lens was then injected into the capsular bag. The remaining viscoelastic was aspirated.   Wounds were hydrated with balanced salt solution.  The anterior chamber was inflated to a physiologic pressure with balanced salt solution.  No wound leaks were noted. Vigamox was injected intracamerally.  Timolol and Brimonidine drops were applied to the eye.  The patient was taken to the recovery room in stable condition without complications of anesthesia or surgery.  Maryann Alar [place] 11/27/2021, 1:17 PM

## 2021-11-27 NOTE — Anesthesia Procedure Notes (Signed)
Procedure Name: MAC Date/Time: 11/27/2021 12:46 PM  Performed by: Dionne Bucy, CRNAPre-anesthesia Checklist: Patient identified, Emergency Drugs available, Suction available, Patient being monitored and Timeout performed Patient Re-evaluated:Patient Re-evaluated prior to induction Oxygen Delivery Method: Nasal cannula Placement Confirmation: positive ETCO2

## 2021-11-27 NOTE — Anesthesia Preprocedure Evaluation (Addendum)
Anesthesia Evaluation  Patient identified by MRN, date of birth, ID band Patient awake    Reviewed: Allergy & Precautions, NPO status   Airway Mallampati: II  TM Distance: >3 FB     Dental   Pulmonary COPD, former smoker,    Pulmonary exam normal        Cardiovascular hypertension,  Rhythm:Regular Rate:Normal     Neuro/Psych    GI/Hepatic GERD  ,  Endo/Other  Hypothyroidism   Renal/GU      Musculoskeletal  (+) Arthritis ,   Abdominal   Peds  Hematology  (+) Blood dyscrasia, anemia ,   Anesthesia Other Findings Hx pancreatic cancer (s/p [place])  Reproductive/Obstetrics                             Anesthesia Physical  Anesthesia Plan  ASA: 2  Anesthesia Plan: MAC   Post-op Pain Management: Minimal or no pain anticipated   Induction: Intravenous  PONV Risk Score and Plan: 2 and TIVA, Midazolam and Treatment may vary due to age or medical condition  Airway Management Planned: Natural Airway and Nasal Cannula  Additional Equipment:   Intra-op Plan:   Post-operative Plan:   Informed Consent: I have reviewed the patients History and Physical, chart, labs and discussed the procedure including the risks, benefits and alternatives for the proposed anesthesia with the patient or authorized representative who has indicated his/her understanding and acceptance.       Plan Discussed with: CRNA  Anesthesia Plan Comments:         Anesthesia Quick Evaluation  

## 2021-11-27 NOTE — H&P (Signed)
[facility]   Primary Care Physician:  McLean-Scocuzza, Nino Glow, MD Ophthalmologist: Dr. Merleen Nicely  Pre-Procedure History & Physical: HPI:  Lynn Taylor is a 76 y.o. female here for cataract surgery.   Past Medical History:  Diagnosis Date   Anemia due to antineoplastic chemotherapy 11/25/2018   Cancer [facility])    GERD (gastroesophageal reflux disease)    Hypertension    Hypomagnesemia 11/25/2018   Hypothyroidism    Malignant neoplasm of head of pancreas ([place]) 06/08/2018   Personal history of chemotherapy 2019    Pancreatic cancer   Wears dentures    full upper and lower    Past Surgical History:  Procedure Laterality Date   CHOLECYSTECTOMY     COLONOSCOPY N/A 12/02/2016   Procedure: COLONOSCOPY;  Surgeon: Lollie Sails, MD;  Location: [facility] ENDOSCOPY;  Service: Endoscopy;  Laterality: N/A;   ERCP N/A 04/30/2018   Procedure: ENDOSCOPIC RETROGRADE CHOLANGIOPANCREATOGRAPHY (ERCP);  Surgeon: Lucilla Lame, MD;  Location: [facility] ENDOSCOPY;  Service: Endoscopy;  Laterality: N/A;   ERCP N/A 05/05/2018   Procedure: ENDOSCOPIC RETROGRADE CHOLANGIOPANCREATOGRAPHY (ERCP);  Surgeon: Lucilla Lame, MD;  Location: [facility] ENDOSCOPY;  Service: Endoscopy;  Laterality: N/A;   PORTA CATH INSERTION N/A 06/28/2018   Procedure: PORTA CATH INSERTION;  Surgeon: Algernon Huxley, MD;  Location: [place] [facility] LAB;  Service: Cardiovascular;  Laterality: N/A;    Prior to Admission medications   Medication Sig Start Date End Date Taking? Authorizing Provider  aspirin (ASPIRIN 81) 81 MG EC tablet Take 2 tablets (162 mg total) by mouth daily. Swallow whole. 04/27/20  Yes Earlie Server, MD  Cholecalciferol (VITAMIN D-3 PO) Take 50 mcg by mouth daily.    Yes [provider]  CRANBERRY PO Take by mouth daily.   Yes [provider]  CREON 36000-114000 units CPEP capsule Take 36,000 Units by mouth 3 (three) times daily. 01/30/21  Yes [provider]  diclofenac Sodium (VOLTAREN) 1 %  GEL Apply topically 4 (four) times daily.   Yes [provider]  fluticasone (FLONASE) 50 MCG/ACT nasal spray Place 2 sprays into both nostrils daily. After nasal saline 11/15/21  Yes McLean-Scocuzza, Nino Glow, MD  gabapentin (NEURONTIN) 400 MG capsule TAKE 1 CAPSULE BY MOUTH 3  TIMES DAILY 06/28/21  Yes McLean-Scocuzza, Nino Glow, MD  hydrochlorothiazide (HYDRODIURIL) 12.5 MG tablet TAKE 1 TABLET BY MOUTH  DAILY IN THE MORNING 06/28/21  Yes McLean-Scocuzza, Nino Glow, MD  levothyroxine (SYNTHROID) 100 MCG tablet Take 1 tablet (100 mcg total) by mouth daily before breakfast. 11/15/21  Yes McLean-Scocuzza, Nino Glow, MD  loperamide (IMODIUM) 2 MG capsule Take 2 mg by mouth 3 (three) times daily as needed for diarrhea or loose stools.   Yes [provider]  Magnesium Cl-Calcium Carbonate (SLOW-MAG PO) Take by mouth. 2 QD   Yes [provider]  Multiple Vitamins-Minerals (CENTRUM SILVER 50+WOMEN PO) Take by mouth.   Yes [provider]  potassium chloride (KLOR-CON) 10 MEQ tablet TAKE 1 TABLET BY MOUTH  DAILY 07/29/21  Yes McLean-Scocuzza, Nino Glow, MD  Simethicone 125 MG CAPS Take by mouth.   Yes [provider]  TURMERIC CURCUMIN PO Take by mouth 3 (three) times daily with meals.   Yes [provider]  vitamin B-12 (CYANOCOBALAMIN) 1000 MCG tablet Take 2,000 mcg by mouth daily.   Yes [provider]  hydrocortisone 2.5 % ointment Apply topically 2 (two) times daily. Prn Patient not taking: Reported on 11/15/2021 01/26/20   McLean-Scocuzza, Nino Glow,  MD  ofloxacin (OCUFLOX) 0.3 % ophthalmic solution 1-2 drops in right eye every 4 hours for 2 days and then 1- 2 drops QID for 5 days in right eye. Patient not taking: Reported on 11/15/2021 08/20/21   Flinchum, Kelby Aline, FNP  ondansetron (ZOFRAN) 4 MG tablet TAKE 1 TABLET BY MOUTH  EVERY 6 HOURS AS NEEDED FOR NAUSEA OR VOMITING. Patient not taking: Reported on 11/15/2021 03/22/20   McLean-Scocuzza, Nino Glow, MD   sodium chloride (OCEAN) 0.65 % SOLN nasal spray Place 2 sprays into both nostrils daily as needed for congestion. 11/17/19   McLean-Scocuzza, Nino Glow, MD    Allergies as of 10/01/2021 - Review Complete 08/21/2021  Allergen Reaction Noted   Other  05/17/2021    Family History  Adopted: Yes  Problem Relation Age of Onset   Diabetes Mellitus I Other    Alcoholism Other    Hypertension Other    Hyperlipidemia Other    Coronary artery disease Other    Stroke Other    Osteoarthritis Other    Migraines Other    Heart Problems Mother    Stroke Sister    CAD Brother    Stroke Brother    Breast cancer Neg Hx     Social History   Socioeconomic History   Marital status: Married    Spouse name: Not on file   Number of children: Not on file   Years of education: Not on file   Highest education level: Not on file  Occupational History   Not on file  Tobacco Use   Smoking status: Former    Types: Cigarettes    Quit date: 04/14/1988    Years since quitting: 33.6   Smokeless tobacco: Never  Vaping Use   Vaping Use: Never used  Substance and Sexual Activity   Alcohol use: Yes    Comment: social drinker   Drug use: No   Sexual activity: Not Currently  Other Topics Concern   Not on file  Social History Narrative   Lives at home married    In order of [place] star   Social Determinants of Health   Financial Resource Strain: [place]  (02/05/2021)   Overall Financial Resource Strain (CARDIA)    Difficulty of Paying Living Expenses: Not hard at all  Food Insecurity: No [place] (02/05/2021)   Hunger Vital Sign    Worried About Running Out of Food in the Last Year: Never true    [place] in the Last Year: Never true  Transportation Needs: No Transportation Needs (02/05/2021)   PRAPARE - Hydrologist (Medical): No    Lack of Transportation (Non-Medical): No  Physical Activity: Sufficiently Active (02/05/2021)   Exercise Vital Sign     Days of Exercise per Week: 5 days    Minutes of Exercise per Session: 30 min  Stress: No Stress Concern Present (02/05/2021)   [place]    Feeling of Stress : Not at all  Social Connections: Unknown (02/05/2021)   Social Connection and Isolation Panel [NHANES]    Frequency of Communication with Friends and Family: Not on file    Frequency of Social Gatherings with Friends and Family: Not on file    Attends Religious Services: Not on file    Active Member of Clubs or Organizations: Not on file    Attends Archivist Meetings: Not on file    Marital Status: Married  Intimate Partner Violence: Not At Risk (02/05/2021)   Humiliation, Afraid, Rape, and Kick questionnaire    Fear of Current or Ex-Partner: No    Emotionally Abused: No    Physically Abused: No    Sexually Abused: No    Review of Systems: See HPI, otherwise negative ROS  Physical Exam: BP (!) 145/68   Pulse 66   Temp 97.9 F (36.6 C) (Temporal)   Ht '5\' 3"'$  (1.6 m)   Wt 69 kg   SpO2 100%   BMI 26.96 kg/m  General:   Alert, cooperative in NAD Head:  Normocephalic and atraumatic. Respiratory:  Normal work of breathing. Cardiovascular:  RRR  Impression/Plan: DAILEE MANALANG is here for cataract surgery.  Risks, benefits, limitations, and alternatives regarding cataract surgery have been reviewed with the patient.  Questions have been answered.  All parties agreeable.   Norvel Richards, MD  11/27/2021, 11:54 AM

## 2021-11-27 NOTE — Anesthesia Postprocedure Evaluation (Signed)
Anesthesia Post Note  Patient: Lynn Taylor  Procedure(s) Performed: CATARACT EXTRACTION PHACO AND INTRAOCULAR LENS PLACEMENT (IOC) RIGHT VISION BLUE, OMIDRIA  7.91 00:48.1 (Right)     Patient location during evaluation: PACU Anesthesia Type: MAC Level of consciousness: awake Pain management: pain level controlled Vital Signs Assessment: post-procedure vital signs reviewed and stable Respiratory status: respiratory function stable Cardiovascular status: stable Postop Assessment: no apparent nausea or vomiting Anesthetic complications: no   No notable events documented.  Veda Canning

## 2021-11-27 NOTE — Addendum Note (Signed)
Addendum  created 11/27/21 1330 by Dionne Bucy, CRNA   Intraprocedure Event edited

## 2021-11-27 NOTE — Transfer of Care (Signed)
Immediate Anesthesia Transfer of Care Note  Patient: Lynn Taylor  Procedure(s) Performed: CATARACT EXTRACTION PHACO AND INTRAOCULAR LENS PLACEMENT (IOC) RIGHT VISION BLUE, OMIDRIA  7.91 00:48.1 (Right)  Patient Location: PACU  Anesthesia Type: MAC  Level of Consciousness: awake, alert  and patient cooperative  Airway and Oxygen Therapy: Patient Spontanous Breathing and Patient connected to supplemental oxygen  Post-op Assessment: Post-op Vital signs reviewed, Patient's Cardiovascular Status Stable, Respiratory Function Stable, Patent Airway and No signs of Nausea or vomiting  Post-op Vital Signs: Reviewed and stable  Complications: No notable events documented.

## 2021-11-28 ENCOUNTER — Encounter: Payer: Self-pay | Admitting: Ophthalmology

## 2021-12-09 ENCOUNTER — Encounter: Payer: Self-pay | Admitting: Ophthalmology

## 2021-12-16 NOTE — Discharge Instructions (Signed)

## 2021-12-18 ENCOUNTER — Ambulatory Visit
Admission: RE | Admit: 2021-12-18 | Discharge: 2021-12-18 | Disposition: A | Discharge status: Home / Self Care | Payer: Medicare Other | Source: Ambulatory Visit | Attending: Ophthalmology | Admitting: Ophthalmology

## 2021-12-18 ENCOUNTER — Ambulatory Visit: Payer: Medicare Other | Admitting: Anesthesiology

## 2021-12-18 ENCOUNTER — Other Ambulatory Visit: Payer: Self-pay

## 2021-12-18 ENCOUNTER — Encounter: Payer: Self-pay | Admitting: Ophthalmology

## 2021-12-18 ENCOUNTER — Encounter
Admission: RE | Disposition: A | Discharge status: Home / Self Care | Payer: Self-pay | Source: Ambulatory Visit | Attending: Ophthalmology

## 2021-12-18 DIAGNOSIS — Z87891 Personal history of nicotine dependence: Secondary | ICD-10-CM | POA: Diagnosis not present

## 2021-12-18 DIAGNOSIS — I10 Essential (primary) hypertension: Secondary | ICD-10-CM | POA: Insufficient documentation

## 2021-12-18 DIAGNOSIS — E039 Hypothyroidism, unspecified: Secondary | ICD-10-CM | POA: Insufficient documentation

## 2021-12-18 DIAGNOSIS — D759 Disease of blood and blood-forming organs, unspecified: Secondary | ICD-10-CM | POA: Diagnosis not present

## 2021-12-18 DIAGNOSIS — H2512 Age-related nuclear cataract, left eye: Secondary | ICD-10-CM | POA: Insufficient documentation

## 2021-12-18 DIAGNOSIS — R7303 Prediabetes: Secondary | ICD-10-CM

## 2021-12-18 DIAGNOSIS — H25012 Cortical age-related cataract, left eye: Secondary | ICD-10-CM | POA: Diagnosis not present

## 2021-12-18 DIAGNOSIS — D649 Anemia, unspecified: Secondary | ICD-10-CM | POA: Diagnosis not present

## 2021-12-18 DIAGNOSIS — J449 Chronic obstructive pulmonary disease, unspecified: Secondary | ICD-10-CM | POA: Diagnosis not present

## 2021-12-18 DIAGNOSIS — M1990 Unspecified osteoarthritis, unspecified site: Secondary | ICD-10-CM | POA: Diagnosis not present

## 2021-12-18 DIAGNOSIS — Z8507 Personal history of malignant neoplasm of pancreas: Secondary | ICD-10-CM | POA: Insufficient documentation

## 2021-12-18 DIAGNOSIS — K219 Gastro-esophageal reflux disease without esophagitis: Secondary | ICD-10-CM | POA: Diagnosis not present

## 2021-12-18 DIAGNOSIS — H25042 Posterior subcapsular polar age-related cataract, left eye: Secondary | ICD-10-CM | POA: Diagnosis not present

## 2021-12-18 HISTORY — PX: CATARACT EXTRACTION W/PHACO: SHX586

## 2021-12-18 SURGERY — PHACOEMULSIFICATION, CATARACT, WITH IOL INSERTION
Anesthesia: Monitor Anesthesia Care | Site: Eye | Laterality: Left

## 2021-12-18 MED ORDER — MOXIFLOXACIN HCL 0.5 % OP SOLN
OPHTHALMIC | Status: DC | PRN
  Administered 2021-12-18: 0.2 mL via OPHTHALMIC

## 2021-12-18 MED ORDER — TETRACAINE HCL 0.5 % OP SOLN
1.0000 [drp] | OPHTHALMIC | Status: DC | PRN
  Administered 2021-12-18 (×3): 1 [drp] via OPHTHALMIC

## 2021-12-18 MED ORDER — MIDAZOLAM HCL 2 MG/2ML IJ SOLN
INTRAMUSCULAR | Status: DC | PRN
  Administered 2021-12-18: 2 mg via INTRAVENOUS

## 2021-12-18 MED ORDER — LACTATED RINGERS IV SOLN: INTRAVENOUS | Status: DC

## 2021-12-18 MED ORDER — ACETAMINOPHEN 500 MG PO TABS: 1000.0000 mg | ORAL_TABLET | Freq: Once | ORAL | Status: DC | PRN

## 2021-12-18 MED ORDER — PHENYLEPHRINE-KETOROLAC 1-0.3 % IO SOLN
INTRAOCULAR | Status: DC | PRN
  Administered 2021-12-18: 87 mL via OPHTHALMIC

## 2021-12-18 MED ORDER — TRYPAN BLUE 0.06 % IO SOSY
PREFILLED_SYRINGE | INTRAOCULAR | Status: DC | PRN
  Administered 2021-12-18: 0.5 mL via INTRAOCULAR

## 2021-12-18 MED ORDER — LIDOCAINE HCL (PF) 2 % IJ SOLN
INTRAOCULAR | Status: DC | PRN
  Administered 2021-12-18: 1 mL via INTRAOCULAR

## 2021-12-18 MED ORDER — BRIMONIDINE TARTRATE-TIMOLOL 0.2-0.5 % OP SOLN
OPHTHALMIC | Status: DC | PRN
  Administered 2021-12-18: 1 [drp] via OPHTHALMIC

## 2021-12-18 MED ORDER — ACETAMINOPHEN 160 MG/5ML PO SOLN: 975.0000 mg | Freq: Once | ORAL | Status: DC | PRN

## 2021-12-18 MED ORDER — FENTANYL CITRATE (PF) 100 MCG/2ML IJ SOLN
INTRAMUSCULAR | Status: DC | PRN
Start: 2021-12-18 — End: 2021-12-18
  Administered 2021-12-18: 50 ug via INTRAVENOUS

## 2021-12-18 MED ORDER — ONDANSETRON HCL 4 MG/2ML IJ SOLN: 4.0000 mg | Freq: Once | INTRAMUSCULAR | Status: DC | PRN

## 2021-12-18 MED ORDER — SIGHTPATH DOSE#1 BSS IO SOLN: INTRAOCULAR | Status: DC | PRN

## 2021-12-18 MED ORDER — SIGHTPATH DOSE#1 BSS IO SOLN
INTRAOCULAR | Status: DC | PRN
  Administered 2021-12-18: 15 mL

## 2021-12-18 MED ORDER — SIGHTPATH DOSE#1 NA HYALUR & NA CHOND-NA HYALUR IO KIT
PACK | INTRAOCULAR | Status: DC | PRN
  Administered 2021-12-18: 1 via OPHTHALMIC

## 2021-12-18 MED ORDER — ARMC OPHTHALMIC DILATING DROPS: OPHTHALMIC | Status: DC | PRN

## 2021-12-18 SURGICAL SUPPLY — 23 items
CANNULA ANT/CHMB 27G (MISCELLANEOUS) IMPLANT
CANNULA ANT/CHMB 27GA (MISCELLANEOUS) IMPLANT
CATARACT SUITE SIGHTPATH (MISCELLANEOUS) ×2 IMPLANT
DISSECTOR HYDRO NUCLEUS 50X22 (MISCELLANEOUS) ×2 IMPLANT
DRSG TEGADERM 2-3/8X2-3/4 SM (GAUZE/BANDAGES/DRESSINGS) ×2 IMPLANT
FEE CATARACT SUITE SIGHTPATH (MISCELLANEOUS) ×1 IMPLANT
GLOVE SURG SYN 7.5  E (GLOVE) ×1
GLOVE SURG SYN 7.5 E (GLOVE) ×1 IMPLANT
GLOVE SURG SYN 7.5 PF PI (GLOVE) ×1 IMPLANT
GLOVE SURG SYN 8.5  E (GLOVE) ×1
GLOVE SURG SYN 8.5 E (GLOVE) ×1 IMPLANT
GLOVE SURG SYN 8.5 PF PI (GLOVE) ×1 IMPLANT
LENS IOL TECNIS EYHANCE 19.5 (Intraocular Lens) ×1 IMPLANT
NDL FILTER BLUNT 18X1 1/2 (NEEDLE) IMPLANT
NEEDLE FILTER BLUNT 18X 1/2SAF (NEEDLE)
NEEDLE FILTER BLUNT 18X1 1/2 (NEEDLE) IMPLANT
PACK VIT ANT 23G (MISCELLANEOUS) IMPLANT
RING MALYGIN (MISCELLANEOUS) IMPLANT
SUT ETHILON 10-0 CS-B-6CS-B-6 (SUTURE)
SUTURE EHLN 10-0 CS-B-6CS-B-6 (SUTURE) IMPLANT
SYR 3ML LL SCALE MARK (SYRINGE) IMPLANT
SYR 5ML LL (SYRINGE) IMPLANT
WATER STERILE IRR 250ML POUR (IV SOLUTION) ×2 IMPLANT

## 2021-12-18 NOTE — Anesthesia Postprocedure Evaluation (Signed)
Anesthesia Post Note  Patient: Lynn Taylor  Procedure(s) Performed: CATARACT EXTRACTION PHACO AND INTRAOCULAR LENS PLACEMENT (IOC) LEFT (Left: Eye)     Patient location during evaluation: PACU Anesthesia Type: MAC Level of consciousness: awake and alert Pain management: pain level controlled Vital Signs Assessment: post-procedure vital signs reviewed and stable Respiratory status: spontaneous breathing, nonlabored ventilation, respiratory function stable and patient connected to nasal cannula oxygen Cardiovascular status: blood pressure returned to baseline and stable Postop Assessment: no apparent nausea or vomiting Anesthetic complications: no   No notable events documented.  April Manson

## 2021-12-18 NOTE — H&P (Signed)
[facility]   Primary Care Physician:  McLean-Scocuzza, Nino Glow, MD Ophthalmologist: Dr. Merleen Nicely  Pre-Procedure History & Physical: HPI:  Lynn Taylor is a 76 y.o. female here for cataract surgery.   Past Medical History:  Diagnosis Date   Anemia due to antineoplastic chemotherapy 11/25/2018   Cancer [facility])    GERD (gastroesophageal reflux disease)    Hypertension    Hypomagnesemia 11/25/2018   Hypothyroidism    Malignant neoplasm of head of pancreas ([place]) 06/08/2018   Personal history of chemotherapy 2019    Pancreatic cancer   Wears dentures    full upper and lower    Past Surgical History:  Procedure Laterality Date   CATARACT EXTRACTION W/PHACO Right 11/27/2021   Procedure: CATARACT EXTRACTION PHACO AND INTRAOCULAR LENS PLACEMENT ([place]) RIGHT VISION BLUE, OMIDRIA  7.91 00:48.1;  Surgeon: Norvel Richards, MD;  Location: [place];  Service: Ophthalmology;  Laterality: Right;   CHOLECYSTECTOMY     COLONOSCOPY N/A 12/02/2016   Procedure: COLONOSCOPY;  Surgeon: Lollie Sails, MD;  Location: [facility] ENDOSCOPY;  Service: Endoscopy;  Laterality: N/A;   ERCP N/A 04/30/2018   Procedure: ENDOSCOPIC RETROGRADE CHOLANGIOPANCREATOGRAPHY (ERCP);  Surgeon: Lucilla Lame, MD;  Location: [facility] ENDOSCOPY;  Service: Endoscopy;  Laterality: N/A;   ERCP N/A 05/05/2018   Procedure: ENDOSCOPIC RETROGRADE CHOLANGIOPANCREATOGRAPHY (ERCP);  Surgeon: Lucilla Lame, MD;  Location: [facility] ENDOSCOPY;  Service: Endoscopy;  Laterality: N/A;   PORTA CATH INSERTION N/A 06/28/2018   Procedure: PORTA CATH INSERTION;  Surgeon: Algernon Huxley, MD;  Location: [place] CV LAB;  Service: Cardiovascular;  Laterality: N/A;    Prior to Admission medications   Medication Sig Start Date End Date Taking? Authorizing Provider  aspirin (ASPIRIN 81) 81 MG EC tablet Take 2 tablets (162 mg total) by mouth daily. Swallow whole. 04/27/20  Yes Earlie Server, MD  Cholecalciferol (VITAMIN D-3 PO) Take 50  mcg by mouth daily.    Yes [provider]  CRANBERRY PO Take by mouth daily.   Yes [provider]  CREON 36000-114000 units CPEP capsule Take 36,000 Units by mouth 3 (three) times daily. 01/30/21  Yes [provider]  fluticasone (FLONASE) 50 MCG/ACT nasal spray Place 2 sprays into both nostrils daily. After nasal saline 11/15/21  Yes McLean-Scocuzza, Nino Glow, MD  gabapentin (NEURONTIN) 400 MG capsule TAKE 1 CAPSULE BY MOUTH 3  TIMES DAILY 06/28/21  Yes McLean-Scocuzza, Nino Glow, MD  hydrochlorothiazide (HYDRODIURIL) 12.5 MG tablet TAKE 1 TABLET BY MOUTH  DAILY IN THE MORNING 06/28/21  Yes McLean-Scocuzza, Nino Glow, MD  levothyroxine (SYNTHROID) 100 MCG tablet Take 1 tablet (100 mcg total) by mouth daily before breakfast. 11/15/21  Yes McLean-Scocuzza, Nino Glow, MD  loperamide (IMODIUM) 2 MG capsule Take 2 mg by mouth 3 (three) times daily as needed for diarrhea or loose stools.   Yes [provider]  Magnesium Cl-Calcium Carbonate (SLOW-MAG PO) Take by mouth. 2 QD   Yes [provider]  Multiple Vitamins-Minerals (CENTRUM SILVER 50+WOMEN PO) Take by mouth.   Yes [provider]  potassium chloride (KLOR-CON) 10 MEQ tablet TAKE 1 TABLET BY MOUTH  DAILY 07/29/21  Yes McLean-Scocuzza, Nino Glow, MD  Simethicone 125 MG CAPS Take by mouth.   Yes [provider]  TURMERIC CURCUMIN PO Take by mouth 3 (three) times daily with meals.   Yes [provider]  vitamin B-12 (CYANOCOBALAMIN) 1000 MCG tablet Take 2,000 mcg by mouth daily.   Yes [provider]  diclofenac Sodium (VOLTAREN) 1 % GEL Apply topically 4 (four) times daily.    [provider]  hydrocortisone 2.5 % ointment Apply topically 2 (two) times daily. Prn Patient not taking: Reported on 11/15/2021 01/26/20   McLean-Scocuzza, Nino Glow, MD  ofloxacin (OCUFLOX) 0.3 % ophthalmic solution 1-2 drops in right eye every 4 hours for 2 days and then 1- 2 drops QID for 5 days in  right eye. Patient not taking: Reported on 11/15/2021 08/20/21   Flinchum, Kelby Aline, FNP  ondansetron (ZOFRAN) 4 MG tablet TAKE 1 TABLET BY MOUTH  EVERY 6 HOURS AS NEEDED FOR NAUSEA OR VOMITING. Patient not taking: Reported on 11/15/2021 03/22/20   McLean-Scocuzza, Nino Glow, MD  sodium chloride (OCEAN) 0.65 % SOLN nasal spray Place 2 sprays into both nostrils daily as needed for congestion. 11/17/19   McLean-Scocuzza, Nino Glow, MD    Allergies as of 10/01/2021 - Review Complete 08/21/2021  Allergen Reaction Noted   Other  05/17/2021    Family History  Adopted: Yes  Problem Relation Age of Onset   Diabetes Mellitus I Other    Alcoholism Other    Hypertension Other    Hyperlipidemia Other    Coronary artery disease Other    Stroke Other    Osteoarthritis Other    Migraines Other    Heart Problems Mother    Stroke Sister    CAD Brother    Stroke Brother    Breast cancer Neg Hx     Social History   Socioeconomic History   Marital status: Married    Spouse name: Not on file   Number of children: Not on file   Years of education: Not on file   Highest education level: Not on file  Occupational History   Not on file  Tobacco Use   Smoking status: Former    Types: Cigarettes    Quit date: 04/14/1988    Years since quitting: 33.7   Smokeless tobacco: Never  Vaping Use   Vaping Use: Never used  Substance and Sexual Activity   Alcohol use: Yes    Comment: social drinker   Drug use: No   Sexual activity: Not Currently  Other Topics Concern   Not on file  Social History Narrative   Lives at home married    In order of [place] star   Social Determinants of Health   Financial Resource Strain: [place]  (02/05/2021)   Overall Financial Resource Strain (CARDIA)    Difficulty of Paying Living Expenses: Not hard at all  Food Insecurity: No [place] (02/05/2021)   Hunger Vital Sign    Worried About Running Out of Food in the Last Year: Never true    [place] in the  Last Year: Never true  Transportation Needs: No Transportation Needs (02/05/2021)   PRAPARE - Hydrologist (Medical): No    Lack of Transportation (Non-Medical): No  Physical Activity: Sufficiently Active (02/05/2021)   Exercise Vital Sign    Days of Exercise per Week: 5 days    Minutes of Exercise per Session: 30 min  Stress: No Stress Concern Present (02/05/2021)   [place]    Feeling of Stress : Not at all  Social Connections: Unknown (02/05/2021)   Social Connection and Isolation Panel [NHANES]    Frequency of Communication with Friends and Family: Not on file    Frequency of Social Gatherings with Friends and  Family: Not on file    Attends Religious Services: Not on file    Active Member of Clubs or Organizations: Not on file    Attends Club or Organization Meetings: Not on file    Marital Status: Married  Intimate Partner Violence: Not At Risk (02/05/2021)   Humiliation, Afraid, Rape, and Kick questionnaire    Fear of Current or Ex-Partner: No    Emotionally Abused: No    Physically Abused: No    Sexually Abused: No    Review of Systems: See HPI, otherwise negative ROS  Physical Exam: BP 138/71   Pulse 69   Temp 98.1 F (36.7 C) (Temporal)   Wt 68.9 kg   SpO2 99%   BMI 26.93 kg/m  General:   Alert, cooperative in NAD Head:  Normocephalic and atraumatic. Respiratory:  Normal work of breathing. Cardiovascular:  RRR  Impression/Plan: Lynn Taylor is here for cataract surgery.  Risks, benefits, limitations, and alternatives regarding cataract surgery have been reviewed with the patient.  Questions have been answered.  All parties agreeable.   Norvel Richards, MD  12/18/2021, 7:19 AM

## 2021-12-18 NOTE — Transfer of Care (Signed)
Immediate Anesthesia Transfer of Care Note  Patient: Lynn Taylor  Procedure(s) Performed: CATARACT EXTRACTION PHACO AND INTRAOCULAR LENS PLACEMENT (IOC) LEFT (Left: Eye)  Patient Location: PACU  Anesthesia Type: MAC  Level of Consciousness: awake, alert  and patient cooperative  Airway and Oxygen Therapy: Patient Spontanous Breathing and Patient connected to supplemental oxygen  Post-op Assessment: Post-op Vital signs reviewed, Patient's Cardiovascular Status Stable, Respiratory Function Stable, Patent Airway and No signs of Nausea or vomiting  Post-op Vital Signs: Reviewed and stable  Complications: No notable events documented.

## 2021-12-18 NOTE — Op Note (Addendum)
OPERATIVE NOTE  Lynn Taylor [id number] 12/18/2021   PREOPERATIVE DIAGNOSIS: Nuclear sclerotic cataract left eye. H25.12   POSTOPERATIVE DIAGNOSIS: Nuclear sclerotic cataract left eye. H25.12   PROCEDURE:  Phacoemusification with posterior chamber intraocular lens placement of the left eye  Ultrasound time: Procedure(s) with comments: CATARACT EXTRACTION PHACO AND INTRAOCULAR LENS PLACEMENT (I[facility]) LEFT (Left) - 7.09 0:56.0  LENS:   Implant Name Type Inv. Item Serial No. Manufacturer Lot No. LRB No. Used Action  LENS IOL TECNIS EYHANCE 19.5 - [id number] Intraocular Lens LENS IOL TECNIS EYHANCE 19.5 [id number] [employer]  Left 1 Implanted      SURGEON:  Courtney Heys. Lazarus Salines, MD   ANESTHESIA:  Topical with tetracaine drops, augmented with 1% preservative-free intracameral lidocaine.   COMPLICATIONS:  None.   DESCRIPTION OF PROCEDURE:  The patient was identified in the holding room and transported to the operating room and placed in the supine position under the operating microscope.  The left eye was identified as the operative eye, which was prepped and draped in the usual sterile ophthalmic fashion.   A 1 millimeter clear-corneal paracentesis was made inferotemporally. Preservative-free 1% lidocaine mixed with 1:1,000 bisulfite-free aqueous solution of epinephrine was injected into the anterior chamber. Due to high grade coritcal spokes, there was a poor red reflex requiring the use of Vision Blue to stain the capsule and facilitate safe creation of the capsulorhexis. The anterior chamber was then filled with Viscoat viscoelastic. A 2.4 millimeter keratome was used to make a clear-corneal incision superotemporally. A curvilinear capsulorrhexis was made with a cystotome and capsulorrhexis forceps. Balanced salt solution was used to hydrodissect and hydrodelineate the nucleus. Phacoemulsification was then used to remove the lens nucleus and epinucleus. The remaining cortex was then removed using  the irrigation and aspiration handpiece. Provisc was then placed into the capsular bag to distend it for lens placement. A +19.50 DIB00 intraocular lens was then injected into the capsular bag. The remaining viscoelastic was aspirated.   Wounds were hydrated with balanced salt solution.  The anterior chamber was inflated to a physiologic pressure with balanced salt solution.  No wound leaks were noted. Vigamox was injected intracamerally.  Timolol and Brimonidine drops were applied to the eye.  The patient was taken to the recovery room in stable condition without complications of anesthesia or surgery.  Lynn Taylor 12/18/2021, 8:01 AM

## 2021-12-18 NOTE — Anesthesia Preprocedure Evaluation (Signed)
Anesthesia Evaluation  Patient identified by MRN, date of birth, ID band Patient awake    Reviewed: Allergy & Precautions, NPO status   Airway Mallampati: II  TM Distance: >3 FB     Dental   Pulmonary COPD, former smoker,    Pulmonary exam normal        Cardiovascular hypertension,  Rhythm:Regular Rate:Normal     Neuro/Psych    GI/Hepatic GERD  ,  Endo/Other  Hypothyroidism   Renal/GU      Musculoskeletal  (+) Arthritis ,   Abdominal   Peds  Hematology  (+) Blood dyscrasia, anemia ,   Anesthesia Other Findings Hx pancreatic cancer (s/p [place])  Reproductive/Obstetrics                             Anesthesia Physical  Anesthesia Plan  ASA: 2  Anesthesia Plan: MAC   Post-op Pain Management: Minimal or no pain anticipated   Induction: Intravenous  PONV Risk Score and Plan: 2 and TIVA, Midazolam and Treatment may vary due to age or medical condition  Airway Management Planned: Natural Airway and Nasal Cannula  Additional Equipment:   Intra-op Plan:   Post-operative Plan:   Informed Consent: I have reviewed the patients History and Physical, chart, labs and discussed the procedure including the risks, benefits and alternatives for the proposed anesthesia with the patient or authorized representative who has indicated his/her understanding and acceptance.       Plan Discussed with: CRNA  Anesthesia Plan Comments:         Anesthesia Quick Evaluation

## 2021-12-19 ENCOUNTER — Encounter: Payer: Self-pay | Admitting: Ophthalmology

## 2021-12-24 ENCOUNTER — Inpatient Hospital Stay: Payer: Medicare Other | Attending: Oncology

## 2021-12-24 DIAGNOSIS — Z87891 Personal history of nicotine dependence: Secondary | ICD-10-CM | POA: Diagnosis not present

## 2021-12-24 DIAGNOSIS — D649 Anemia, unspecified: Secondary | ICD-10-CM | POA: Insufficient documentation

## 2021-12-24 DIAGNOSIS — C250 Malignant neoplasm of head of pancreas: Secondary | ICD-10-CM | POA: Diagnosis not present

## 2021-12-24 DIAGNOSIS — G620 Drug-induced polyneuropathy: Secondary | ICD-10-CM | POA: Diagnosis not present

## 2021-12-24 DIAGNOSIS — R7989 Other specified abnormal findings of blood chemistry: Secondary | ICD-10-CM | POA: Diagnosis not present

## 2021-12-24 DIAGNOSIS — Z86718 Personal history of other venous thrombosis and embolism: Secondary | ICD-10-CM | POA: Diagnosis not present

## 2021-12-24 LAB — CBC WITH DIFFERENTIAL/PLATELET
Abs Immature Granulocytes: 0.02 10*3/uL (ref 0.00–0.07)
Basophils Absolute: 0 10*3/uL (ref 0.0–0.1)
Basophils Relative: 1 %
Eosinophils Absolute: 0 10*3/uL (ref 0.0–0.5)
Eosinophils Relative: 1 %
HCT: 35 % — ABNORMAL LOW (ref 36.0–46.0)
Hemoglobin: 11.3 g/dL — ABNORMAL LOW (ref 12.0–15.0)
Immature Granulocytes: 0 %
Lymphocytes Relative: 29 %
Lymphs Abs: 1.8 10*3/uL (ref 0.7–4.0)
MCH: 28.7 pg (ref 26.0–34.0)
MCHC: 32.3 g/dL (ref 30.0–36.0)
MCV: 88.8 fL (ref 80.0–100.0)
Monocytes Absolute: 0.3 10*3/uL (ref 0.1–1.0)
Monocytes Relative: 5 %
Neutro Abs: 4 10*3/uL (ref 1.7–7.7)
Neutrophils Relative %: 64 %
Platelets: 193 10*3/uL (ref 150–400)
RBC: 3.94 MIL/uL (ref 3.87–5.11)
RDW: 14.2 % (ref 11.5–15.5)
WBC: 6.2 10*3/uL (ref 4.0–10.5)
nRBC: 0 % (ref 0.0–0.2)

## 2021-12-24 LAB — COMPREHENSIVE METABOLIC PANEL
ALT: 16 U/L (ref 0–44)
AST: 21 U/L (ref 15–41)
Albumin: 4.1 g/dL (ref 3.5–5.0)
Alkaline Phosphatase: 60 U/L (ref 38–126)
Anion gap: 5 (ref 5–15)
BUN: 19 mg/dL (ref 8–23)
CO2: 24 mmol/L (ref 22–32)
Calcium: 9 mg/dL (ref 8.9–10.3)
Chloride: 107 mmol/L (ref 98–111)
Creatinine, Ser: 0.85 mg/dL (ref 0.44–1.00)
GFR, Estimated: >60 mL/min (ref >60)
Glucose, Bld: 104 mg/dL — ABNORMAL HIGH (ref 70–99)
Potassium: 3.4 mmol/L — ABNORMAL LOW (ref 3.5–5.1)
Sodium: 136 mmol/L (ref 135–145)
Total Bilirubin: 0.7 mg/dL (ref 0.3–1.2)
Total Protein: 7.6 g/dL (ref 6.5–8.1)

## 2021-12-24 LAB — IRON AND TIBC
Iron: 87 ug/dL (ref 28–170)
Saturation Ratios: 20 % (ref 10.4–31.8)
TIBC: 427 ug/dL (ref 250–450)
UIBC: 340 ug/dL

## 2021-12-24 LAB — RETIC PANEL
Immature Retic Fract: 13.4 % (ref 2.3–15.9)
RBC.: 3.94 MIL/uL (ref 3.87–5.11)
Retic Count, Absolute: 70.1 10*3/uL (ref 19.0–186.0)
Retic Ct Pct: 1.8 % (ref 0.4–3.1)
Reticulocyte Hemoglobin: 32.5 pg (ref >27.9)

## 2021-12-24 LAB — VITAMIN B12: Vitamin B-12: 3579 pg/mL — ABNORMAL HIGH (ref 180–914)

## 2021-12-24 LAB — FERRITIN: Ferritin: 25 ng/mL (ref 11–307)

## 2021-12-25 LAB — CANCER ANTIGEN 19-9: CA 19-9: 16 U/mL (ref 0–35)

## 2021-12-26 ENCOUNTER — Encounter: Payer: Self-pay | Admitting: Oncology

## 2021-12-26 ENCOUNTER — Inpatient Hospital Stay (HOSPITAL_BASED_OUTPATIENT_CLINIC_OR_DEPARTMENT_OTHER): Payer: Medicare Other | Admitting: Oncology

## 2021-12-26 VITALS — BP 119/67 | HR 68 | Temp 96.6°F | Resp 18 | Wt 149.6 lb

## 2021-12-26 DIAGNOSIS — G620 Drug-induced polyneuropathy: Secondary | ICD-10-CM

## 2021-12-26 DIAGNOSIS — D649 Anemia, unspecified: Secondary | ICD-10-CM | POA: Diagnosis not present

## 2021-12-26 DIAGNOSIS — Z86718 Personal history of other venous thrombosis and embolism: Secondary | ICD-10-CM

## 2021-12-26 DIAGNOSIS — T451X5A Adverse effect of antineoplastic and immunosuppressive drugs, initial encounter: Secondary | ICD-10-CM

## 2021-12-26 DIAGNOSIS — C250 Malignant neoplasm of head of pancreas: Secondary | ICD-10-CM

## 2021-12-26 DIAGNOSIS — Z95828 Presence of other vascular implants and grafts: Secondary | ICD-10-CM | POA: Diagnosis not present

## 2021-12-26 DIAGNOSIS — R7989 Other specified abnormal findings of blood chemistry: Secondary | ICD-10-CM | POA: Diagnosis not present

## 2021-12-26 DIAGNOSIS — I10 Essential (primary) hypertension: Secondary | ICD-10-CM | POA: Diagnosis not present

## 2021-12-26 DIAGNOSIS — Z87891 Personal history of nicotine dependence: Secondary | ICD-10-CM

## 2021-12-26 HISTORY — DX: Personal history of other venous thrombosis and embolism: Z86.718

## 2021-12-26 NOTE — Progress Notes (Signed)
Hematology/Oncology Follow Up Note [phone] [phone]  Patient Care Team: McLean-Scocuzza, Nino Glow, MD as PCP - General (Internal Medicine) Clent Jacks, RN as Registered Nurse Lynn Bellows, MD as Consulting Physician (Gastroenterology) Clent Jacks, RN as Oncology Nurse Navigator Lynn Server, MD as Consulting Physician (Oncology) Lynn Helper Stacie Acres, MD as Referring Physician (Oncology) Lynn Huxley, MD as Referring Physician (Vascular Surgery)   Name of the patient: Lynn Taylor  [id number]  1945-07-14   REASON FOR VISIT  follow-up for pancreatic cancer.  HISTORY OF PRESENTING ILLNESS:  Lynn Taylor is a  76 y.o.  female with PMH listed below who was referred to me for evaluation of evaluation of elevated ferritin. Patient recently had lab work-up done on 04/09/2018 which showed ferritin level 1877, iron 97, TIBC 340, iron saturation 29, 04/11/2018 CBC showed hemoglobin 13.2, MCV 81, WBC 7.6, platelet counts 246,000. Patient was referred to hematology for further evaluation of elevated ferritin.   #04/28/2018 Ultrasound of liver was obtained which showed CBD and intrahepatic biliary dilatation Patient wa she is s advised to go to emergency room for further evaluation. Also had hyperbilirubinemia.  Patient was complaining about being jaundiced, pruritus all over. MR abdomen MRCP with and without contrast showed market biliary duct dilatation, secondary to obstruction in the region of pancreatic head.  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 No abdominal adenopathy, liver metastasis or cross vascular involvement.  patient was evaluated by gastroenterology and status post ERCP with stenting. CA 19.9 1173 CEA 3.9 # 05/25/2018  patient was referred to [facility] and s/p whipple resection.Her postop course was c/b Type A pancreatic leak and c.diff. Pathology showed A. Hepatic artery lymph node,  excision: One lymph node, negative for malignancy (0/1). B. Biliary stent, removal: Medical device, consistent with stent, gross examination only. C. 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 All margins are negative (closest margin=uncinate, 0.5 mm) Metastatic adenocarcinoma in one of thirty-two lymph nodes (1/32).  Portion of stomach and duodenum with no specific pathologic diagnosis.   Grade, 3 poorly differentiated.  Pathologic pT2 pN1   Cancer Treatment 07/13/2018 Cycle 1 FOLFIRINOX with Onpro Neulasta.  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 10/06/2018 cycle 6  mFOLFIRINOX with Onpro Neulasta - [50% dose for Irinotecan- 5-FU bolus omitted]. 10/20/2018 cycle 7 mFOLFIRINOX with Onpro Neulasta - [50% dose for Irinotecan- 5-FU bolus omitted].  # 04/02/2020 [facility] CT chest abdomen pelvis  Postsurgical changes from Whipple procedure. Minimally increased dilation of the main pancreatic duct, which is nonspecific. Overall stable appearance of the pancreaticojejunostomy. Recommend attention on follow-up.  Decreased, previously prominent right axillary lymph node. No definite findings of new or increased metastatic disease in the chest, abdomen, or pelvis.  05/20/2021, [facility] CT chest abdomen pelvis with contrast showed no evidence of recurrent or metastatic disease in the chest  abdomen pelvis.  INTERVAL HISTORY Lynn Taylor is a 76 y.o. female with history of stage IIb pancreatic cancer presents to  for follow-up.   Patient was recently seen at [facility] and had a CT scan done there. 11/18/2021 CT chest abdomen pelvis w contrast showed no evidence of recurrent or metastatic disease in the chest, abdomen, or  pelvis.  Denies any unintentional weight loss, fever, chills, diarrhea, abdominal pain.  Her weight is stable.  She has no new complaints   Review of Systems  Constitutional:  Negative for appetite change, chills, fatigue, fever and unexpected weight change.  HENT:   Negative for hearing loss and voice change.   Eyes:  Negative for eye problems.  Respiratory:  Negative for chest tightness, cough and shortness of breath.   Cardiovascular:  Negative for chest pain and leg swelling.  Gastrointestinal:  Negative for abdominal distention, abdominal pain, blood in stool and diarrhea.  Endocrine: Negative for hot flashes.  Genitourinary:  Negative for difficulty urinating and frequency.   Musculoskeletal:  Negative for arthralgias.  Skin:  Negative for itching and rash.  Neurological:  Positive for numbness. Negative for extremity weakness.  Hematological:  Negative for adenopathy.  Psychiatric/Behavioral:  Negative for confusion.       Allergies  Allergen Reactions   Other     Hair dye blisters      Past Medical History:  Diagnosis Date   Anemia due to antineoplastic chemotherapy 11/25/2018   Cancer ([place])    GERD (gastroesophageal reflux disease)    Hypertension    Hypomagnesemia 11/25/2018   Hypothyroidism    Malignant neoplasm of head of pancreas ([place]) 06/08/2018   Personal history of chemotherapy 2019    Pancreatic cancer   Wears dentures    full upper and lower     Past Surgical History:  Procedure Laterality Date   CATARACT EXTRACTION W/PHACO Right 11/27/2021   Procedure: CATARACT EXTRACTION PHACO AND INTRAOCULAR LENS PLACEMENT ([place]) RIGHT  VISION BLUE, OMIDRIA  7.91 00:48.1;  Surgeon: Norvel Richards, MD;  Location: [place];  Service: Ophthalmology;  Laterality: Right;   CATARACT EXTRACTION W/PHACO Left 12/18/2021   Procedure: CATARACT EXTRACTION PHACO AND INTRAOCULAR LENS PLACEMENT ([place]) LEFT;  Surgeon: Norvel Richards, MD;  Location: [place];  Service: Ophthalmology;  Laterality: Left;  7.09 0:56.0   CHOLECYSTECTOMY     COLONOSCOPY N/A 12/02/2016   Procedure: COLONOSCOPY;  Surgeon: Lollie Sails, MD;  Location: [facility] ENDOSCOPY;  Service: Endoscopy;  Laterality: N/A;   ERCP N/A 04/30/2018   Procedure: ENDOSCOPIC RETROGRADE CHOLANGIOPANCREATOGRAPHY (ERCP);  Surgeon: Lucilla Lame, MD;  Location: [facility] ENDOSCOPY;  Service: Endoscopy;  Laterality: N/A;   ERCP N/A 05/05/2018   Procedure: ENDOSCOPIC RETROGRADE CHOLANGIOPANCREATOGRAPHY (ERCP);  Surgeon: Lucilla Lame, MD;  Location: [facility] ENDOSCOPY;  Service: Endoscopy;  Laterality: N/A;   PORTA CATH INSERTION N/A 06/28/2018   Procedure: PORTA CATH INSERTION;  Surgeon: Lynn Huxley, MD;  Location: [facility];  Service: Cardiovascular;  Laterality: N/A;    Social History   Socioeconomic History   Marital status: Married    Spouse name: Not on file   Number of children: Not on file   Years of education: Not on file   Highest education level: Not on file  Occupational History   Not on file  Tobacco Use   Smoking status: Former    Types: Cigarettes    Quit date: 04/14/1988    Years since quitting: 33.7   Smokeless tobacco: Never  Vaping Use   Vaping Use: Never used  Substance and Sexual Activity   Alcohol use: Yes    Comment:  social drinker   Drug use: No   Sexual activity: Not Currently  Other Topics Concern   Not on file  Social History Narrative   Lives at home married    In order of [place] star   Social Determinants of Health   Financial Resource Strain: Low Risk  (02/05/2021)   Overall Financial Resource Strain  (CARDIA)    Difficulty of Paying Living Expenses: Not hard at all  Food Insecurity: No Food Insecurity (02/05/2021)   Hunger Vital Sign    Worried About Running Out of Food in the Last Year: Never true    [place] in the Last Year: Never true  Transportation Needs: No Transportation Needs (02/05/2021)   PRAPARE - Hydrologist (Medical): No    Lack of Transportation (Non-Medical): No  Physical Activity: Sufficiently Active (02/05/2021)   Exercise Vital Sign    Days of Exercise per Week: 5 days    Minutes of Exercise per Session: 30 min  Stress: No Stress Concern Present (02/05/2021)   [place]    Feeling of Stress : Not at all  Social Connections: Unknown (02/05/2021)   Social Connection and Isolation Panel [NHANES]    Frequency of Communication with Friends and Family: Not on file    Frequency of Social Gatherings with Friends and Family: Not on file    Attends Religious Services: Not on file    Active Member of Clubs or Organizations: Not on file    Attends Archivist Meetings: Not on file    Marital Status: Married  Intimate Partner Violence: Not At Risk (02/05/2021)   Humiliation, Afraid, Rape, and Kick questionnaire    Fear of Current or Ex-Partner: No    Emotionally Abused: No    Physically Abused: No    Sexually Abused: No    Family History  Adopted: Yes  Problem Relation Age of Onset   Diabetes Mellitus I Other    Alcoholism Other    Hypertension Other    Hyperlipidemia Other    Coronary artery disease Other    Stroke Other    Osteoarthritis Other    Migraines Other    Heart Problems Mother    Stroke Sister    CAD Brother    Stroke Brother    Breast cancer Neg Hx      Current Outpatient Medications:    ascorbic acid (VITAMIN C) 500 MG tablet, , Disp: , Rfl:    aspirin (ASPIRIN 81) 81 MG EC tablet, Take 2 tablets (162 mg total) by mouth daily. Swallow  whole., Disp: 180 tablet, Rfl: 1   Cholecalciferol (VITAMIN D-3 PO), Take 50 mcg by mouth daily. , Disp: , Rfl:    CREON 36000-114000 units CPEP capsule, Take 36,000 Units by mouth 3 (three) times daily., Disp: , Rfl:    gabapentin (NEURONTIN) 400 MG capsule, TAKE 1 CAPSULE BY MOUTH 3  TIMES DAILY, Disp: 270 capsule, Rfl: 3   hydrochlorothiazide (HYDRODIURIL) 12.5 MG tablet, TAKE 1 TABLET BY MOUTH  DAILY IN THE MORNING, Disp: 90 tablet, Rfl: 3   KLOR-CON M20 20 MEQ tablet, Take 20 mEq by mouth every morning., Disp: , Rfl:    levothyroxine (SYNTHROID) 100 MCG tablet, Take 1 tablet (100 mcg total) by mouth daily before breakfast., Disp: 90 tablet, Rfl: 3   loperamide (IMODIUM) 2 MG capsule, Take 2 mg by mouth 3 (three) times daily as needed for diarrhea or loose  stools., Disp: , Rfl:    Magnesium Cl-Calcium Carbonate (SLOW-MAG PO), Take by mouth. 2 QD, Disp: , Rfl:    Multiple Vitamins-Minerals (CENTRUM SILVER 50+WOMEN PO), Take by mouth., Disp: , Rfl:    Simethicone 125 MG CAPS, Take by mouth., Disp: , Rfl:    TURMERIC CURCUMIN PO, Take by mouth 3 (three) times daily with meals., Disp: , Rfl:    vitamin B-12 (CYANOCOBALAMIN) 1000 MCG tablet, Take 2,000 mcg by mouth daily., Disp: , Rfl:    CRANBERRY PO, Take by mouth daily. (Patient not taking: Reported on 12/26/2021), Disp: , Rfl:    diclofenac Sodium (VOLTAREN) 1 % GEL, Apply topically 4 (four) times daily., Disp: , Rfl:    fluticasone (FLONASE) 50 MCG/ACT nasal spray, Place 2 sprays into both nostrils daily. After nasal saline (Patient not taking: Reported on 12/26/2021), Disp: 48 g, Rfl: 3   hydrocortisone 2.5 % ointment, Apply topically 2 (two) times daily. Prn (Patient not taking: Reported on 11/15/2021), Disp: 30 g, Rfl: 0   lidocaine-prilocaine (EMLA) cream, Apply topically., Disp: , Rfl:    ofloxacin (OCUFLOX) 0.3 % ophthalmic solution, 1-2 drops in right eye every 4 hours for 2 days and then 1- 2 drops QID for 5 days in right eye. (Patient not  taking: Reported on 11/15/2021), Disp: 5 mL, Rfl: 0   ondansetron (ZOFRAN) 4 MG tablet, Take by mouth. (Patient not taking: Reported on 12/26/2021), Disp: , Rfl:    sodium chloride (OCEAN) 0.65 % SOLN nasal spray, Place 2 sprays into both nostrils daily as needed for congestion. (Patient not taking: Reported on 12/26/2021), Disp: 30 mL, Rfl: 2  Physical exam:  Vitals:   12/26/21 1100  BP: 119/67  Pulse: 68  Resp: 18  Temp: (!) 96.6 F (35.9 C)  SpO2: 100%  Weight: 149 lb 9.6 oz (67.9 kg)   Physical Exam Constitutional:      General: She is not in acute distress. HENT:     Head: Normocephalic and atraumatic.  Eyes:     General: No scleral icterus.    Pupils: Pupils are equal, round, and reactive to light.  Cardiovascular:     Rate and Rhythm: Normal rate and regular rhythm.     Heart sounds: Normal heart sounds.  Pulmonary:     Effort: Pulmonary effort is normal. No respiratory distress.     Breath sounds: No wheezing.  Abdominal:     General: Bowel sounds are normal. There is no distension.     Palpations: Abdomen is soft. There is no mass.     Tenderness: There is no abdominal tenderness.  Musculoskeletal:        General: No swelling or deformity. Normal range of motion.     Cervical back: Normal range of motion and neck supple.  Skin:    General: Skin is warm and dry.     Findings: No erythema or rash.  Neurological:     Mental Status: She is alert and oriented to person, place, and time. Mental status is at baseline.     Cranial Nerves: No cranial nerve deficit.     Coordination: Coordination normal.  Psychiatric:        Mood and Affect: Mood normal.    Lab results.     Latest Ref Rng & Units 12/24/2021    9:59 AM 05/28/2021    9:39 AM 01/04/2021    1:21 PM  CBC  WBC 4.0 - 10.5 K/uL 6.2  6.9  7.2   Hemoglobin 12.0 -  15.0 g/dL 11.3  10.4  10.8   Hematocrit 36.0 - 46.0 % 35.0  32.4  33.8   Platelets 150 - 400 K/uL 193  170  177       Latest Ref Rng & Units  12/24/2021    9:59 AM 05/28/2021    9:39 AM 02/26/2021    9:11 AM  CMP  Glucose 70 - 99 mg/dL 104  112  113   BUN 8 - 23 mg/dL '19  14  10   '$ Creatinine 0.44 - 1.00 mg/dL 0.85  0.88  0.83   Sodium 135 - 145 mmol/L 136  133  140   Potassium 3.5 - 5.1 mmol/L 3.4  3.8  3.8   Chloride 98 - 111 mmol/L 107  103  104   CO2 22 - 32 mmol/L '24  23  30   '$ Calcium 8.9 - 10.3 mg/dL 9.0  9.2  9.4   Total Protein 6.5 - 8.1 g/dL 7.6  7.3    Total Bilirubin 0.3 - 1.2 mg/dL 0.7  0.5    Alkaline Phos 38 - 126 U/L 60  53    AST 15 - 41 U/L 21  19    ALT 0 - 44 U/L 16  15       RADIOGRAPHIC STUDIES: I have personally reviewed the radiological images as listed and agreed with the findings in the report. No results found. [facility] CT report was reviewed.   Assessment and plan Patient is a 76 y.o. female history of stage IIb pancreatic cancer present for follow up. 1. Malignant neoplasm of head of pancreas ([place])   2. History of deep vein thrombosis (DVT) of lower extremity   3. Normocytic anemia   4. Port-A-Cath in place   5. Chemotherapy-induced neuropathy ([place])    #Stage IIB pancreatic cancer status post Whipple resection [05/25/18]. Status post 7 out of the 12 planned cycles of mFOLFIRINOX with difficulties and have decided not to proceed with additional chemo.No evidence of metastatic disease on recent CT scan done at [facility]. CA19.9  is stable.  Continue to follow-up for surveillance.  She is 3.5 post surgery.  continue follow-up every 6 months.  CA 19-9, CT scan every 6 to 12 months.  #Normocytic anemia, hemoglobin 11.3 stable, continue to monitor. # Vitamin B12 level is elevated. Recommend patient to decrease B12 supplementation to 1000 mcg 1-2 time per week.   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 Continue Aspirin '162mg'$  daily as prophylaxis.   #Chemotherapy-induced neuropathy, continue gabapentin 400 mg 3 times daily. #Port-A-Cath in place, continue port flush every  8 weeks.   follow-up in 6 months.   Lynn Server, MD, PhD 12/26/2021

## 2021-12-30 DIAGNOSIS — H15101 Unspecified episcleritis, right eye: Secondary | ICD-10-CM | POA: Diagnosis not present

## 2021-12-31 DIAGNOSIS — E890 Postprocedural hypothyroidism: Secondary | ICD-10-CM | POA: Diagnosis not present

## 2021-12-31 DIAGNOSIS — E042 Nontoxic multinodular goiter: Secondary | ICD-10-CM | POA: Diagnosis not present

## 2021-12-31 DIAGNOSIS — E785 Hyperlipidemia, unspecified: Secondary | ICD-10-CM | POA: Diagnosis not present

## 2021-12-31 DIAGNOSIS — I10 Essential (primary) hypertension: Secondary | ICD-10-CM | POA: Diagnosis not present

## 2021-12-31 DIAGNOSIS — C259 Malignant neoplasm of pancreas, unspecified: Secondary | ICD-10-CM | POA: Diagnosis not present

## 2022-01-07 DIAGNOSIS — E890 Postprocedural hypothyroidism: Secondary | ICD-10-CM | POA: Diagnosis not present

## 2022-01-07 DIAGNOSIS — E559 Vitamin D deficiency, unspecified: Secondary | ICD-10-CM | POA: Diagnosis not present

## 2022-01-07 DIAGNOSIS — K229 Disease of esophagus, unspecified: Secondary | ICD-10-CM | POA: Diagnosis not present

## 2022-01-07 DIAGNOSIS — I10 Essential (primary) hypertension: Secondary | ICD-10-CM | POA: Diagnosis not present

## 2022-01-07 DIAGNOSIS — E785 Hyperlipidemia, unspecified: Secondary | ICD-10-CM | POA: Diagnosis not present

## 2022-01-07 DIAGNOSIS — C259 Malignant neoplasm of pancreas, unspecified: Secondary | ICD-10-CM | POA: Diagnosis not present

## 2022-01-08 ENCOUNTER — Inpatient Hospital Stay: Payer: Medicare Other | Attending: Oncology

## 2022-01-08 VITALS — BP 138/76 | HR 72 | Resp 18

## 2022-01-08 DIAGNOSIS — C250 Malignant neoplasm of head of pancreas: Secondary | ICD-10-CM | POA: Insufficient documentation

## 2022-01-08 DIAGNOSIS — E871 Hypo-osmolality and hyponatremia: Secondary | ICD-10-CM

## 2022-01-08 DIAGNOSIS — H15101 Unspecified episcleritis, right eye: Secondary | ICD-10-CM | POA: Diagnosis not present

## 2022-01-08 DIAGNOSIS — Z452 Encounter for adjustment and management of vascular access device: Secondary | ICD-10-CM | POA: Diagnosis not present

## 2022-01-08 MED ORDER — SODIUM CHLORIDE 0.9% FLUSH
10.0000 mL | Freq: Once | INTRAVENOUS | Status: AC | PRN
  Administered 2022-01-08: 10 mL
  Filled 2022-01-08: qty 10

## 2022-01-08 MED ORDER — HEPARIN SOD (PORK) LOCK FLUSH 100 UNIT/ML IV SOLN
500.0000 [IU] | Freq: Once | INTRAVENOUS | Status: AC | PRN
  Administered 2022-01-08: 500 [IU]
  Filled 2022-01-08: qty 5

## 2022-01-08 NOTE — Patient Instructions (Signed)
[facility] CANCER CTR AT [place]  Discharge Instructions: Thank you for choosing [facility] to provide your oncology and hematology care.  If you have a lab appointment with the [facility], please go directly to the [facility] and check in at the registration area.  Wear comfortable clothing and clothing appropriate for easy access to any Portacath or PICC line.   We strive to give you quality time with your provider. You may need to reschedule your appointment if you arrive late (15 or more minutes).  Arriving late affects you and other patients whose appointments are after yours.  Also, if you miss three or more appointments without notifying the office, you may be dismissed from the clinic at the provider's discretion.      For prescription refill requests, have your pharmacy contact our office and allow 72 hours for refills to be completed.    Today you received the following chemotherapy and/or immunotherapy agents PORT FLUSH      To help prevent nausea and vomiting after your treatment, we encourage you to take your nausea medication as directed.  BELOW ARE SYMPTOMS THAT SHOULD BE REPORTED IMMEDIATELY: *FEVER GREATER THAN 100.4 F (38 C) OR HIGHER *CHILLS OR SWEATING *NAUSEA AND VOMITING THAT IS NOT CONTROLLED WITH YOUR NAUSEA MEDICATION *UNUSUAL SHORTNESS OF BREATH *UNUSUAL BRUISING OR BLEEDING *URINARY PROBLEMS (pain or burning when urinating, or frequent urination) *BOWEL PROBLEMS (unusual diarrhea, constipation, pain near the anus) TENDERNESS IN MOUTH AND THROAT WITH OR WITHOUT PRESENCE OF ULCERS (sore throat, sores in mouth, or a toothache) UNUSUAL RASH, SWELLING OR PAIN  UNUSUAL VAGINAL DISCHARGE OR ITCHING   Items with * indicate a potential emergency and should be followed up as soon as possible or go to the Emergency Department if any problems should occur.  Please show the CHEMOTHERAPY ALERT CARD or IMMUNOTHERAPY ALERT CARD at check-in to  the Emergency Department and triage nurse.  Should you have questions after your visit or need to cancel or reschedule your appointment, please contact [facility] CANCER [place] AT [place]  [phone] and follow the prompts.  Office hours are 8:00 a.m. to 4:30 p.m. Monday - Friday. Please note that voicemails left after 4:00 p.m. may not be returned until the following business day.  We are closed weekends and major holidays. You have access to a nurse at all times for urgent questions. Please call the main number to the clinic [phone] and follow the prompts.  For any non-urgent questions, you may also contact your provider using MyChart. We now offer e-Visits for anyone 3 and older to request care online for non-urgent symptoms. For details visit [email or web address].   Also download the MyChart app! Go to the app store, search "MyChart", open the app, select [facility], and log in with your MyChart username and password.  Masks are optional in the cancer centers. If you would like for your care team to wear a mask while they are taking care of you, please let them know. For doctor visits, patients may have with them one support person who is at least 77 years old. At this time, visitors are not allowed in the infusion area.

## 2022-01-20 ENCOUNTER — Encounter: Payer: Self-pay | Admitting: Family Medicine

## 2022-01-20 ENCOUNTER — Telehealth: Payer: Self-pay | Admitting: Internal Medicine

## 2022-01-20 ENCOUNTER — Ambulatory Visit (INDEPENDENT_AMBULATORY_CARE_PROVIDER_SITE_OTHER): Payer: Medicare Other | Admitting: Family Medicine

## 2022-01-20 ENCOUNTER — Telehealth: Payer: Self-pay

## 2022-01-20 VITALS — Temp 99.0°F | Ht 62.0 in | Wt 147.1 lb

## 2022-01-20 DIAGNOSIS — Z20822 Contact with and (suspected) exposure to covid-19: Secondary | ICD-10-CM | POA: Diagnosis not present

## 2022-01-20 DIAGNOSIS — U071 COVID-19: Secondary | ICD-10-CM

## 2022-01-20 DIAGNOSIS — I951 Orthostatic hypotension: Secondary | ICD-10-CM | POA: Diagnosis not present

## 2022-01-20 LAB — POC COVID19 BINAXNOW: SARS Coronavirus 2 Ag: POSITIVE — AB

## 2022-01-20 MED ORDER — NIRMATRELVIR/RITONAVIR (PAXLOVID)TABLET: 3.0000 | ORAL_TABLET | Freq: Two times a day (BID) | ORAL | 0 refills | Status: AC

## 2022-01-20 NOTE — Telephone Encounter (Signed)
I spoke with pts daughter and got her mother scheduled for today in [facility] with Dr. Lorelei Pont @ 12pm

## 2022-01-20 NOTE — Progress Notes (Signed)
Lynn Villers T. Lynn Macke, MD, [place] at [facility] [place], [address]  Phone: [phone]  FAX: [place] - 76 y.o. female  MRN [id number]  Date of Birth: 1946-01-02  Date: 01/20/2022  PCP: McLean-Scocuzza, Lynn Glow, MD  Referral: McLean-Scocuzza, Lynn Taylor *  Chief Complaint  Patient presents with   [place] this morning twice, got lightheaded, going on for about 4 month, felt bad yesterday   Subjective:   Lynn Taylor is a 76 y.o. very pleasant female patient with Body mass index is 26.91 kg/m. who presents with the following:  The patient is a patient of Dr. Terese Door with a h/o of pancreatic cancer, and I am asked to work in the patient for evaluation of some lightheadedness for 4 months as well as a fall this morning, fever, chills and generally not feeling well.  Was outside sweeping her porch, taking chemo - ongoing pancreatic cancer.   Felt lightheaded, and made it down the hallway.   This morning, hit her head when she lost her balance.     For a while, will feel hot and will feel dizzy.  Has been feeling cold, sometimes. Feeling lightheaded.   Per daughter, fell out and hit her head.  Now with a sore throat, runny nose. Fever 100.9 yesterday.  No real cough, no sob. Eating and drinking ok.  Review of Systems is noted in the HPI, as appropriate  Objective:   Temp 99 F (37.2 C) (Oral)   Ht '5\' 2"'$  (1.575 m)   Wt 147 lb 2 oz (66.7 kg)   SpO2 98%   BMI 26.91 kg/m   Orthostatic VS for the past 24 hrs:  BP- Lying Pulse- Lying BP- Standing at 0 minutes Pulse- Standing at 0 minutes  01/20/22 1232 -- -- 98/58 88  01/20/22 1231 120/78 72 -- --      Gen: WDWN, NAD. Globally Non-toxic HEENT: Throat clear, w/o exudate, R TM clear, L TM - good landmarks, No fluid present. rhinnorhea.  MMM Frontal sinuses: NT Max sinuses: NT NECK: Anterior cervical  LAD is absent CV: RRR, No  M/G/R, cap refill <2 sec PULM: Breathing comfortably in no respiratory distress. no wheezing, crackles, rhonchi   Laboratory and Imaging Data: Results for orders placed or performed in visit on 01/20/22  POC COVID-19 BinaxNow  Result Value Ref Range   SARS Coronavirus 2 Ag Positive (A) Negative     Assessment and Plan:     ICD-10-CM   1. COVID-19  U07.1     2. Exposure to COVID-19 virus  Z20.822 POC COVID-19 BinaxNow    3. Orthostatic hypotension  I95.1      She has acute COVID-19, I think that this was almost certainly the cause of her feeling cold, feverish, generally not feeling well as well as being lightheaded.  She has had some intermittent lightheadedness, she is orthostatic today, is difficult to know if this is from her acute COVID or something else.  For right now, hold antihypertensive.  Patient Instructions  You have Covid-19  Stop your hydrochlorothiazide while you are sick.   Medication Management during today's office visit: Meds ordered this encounter  Medications   nirmatrelvir/ritonavir EUA (PAXLOVID) 20 x 150 MG & 10 x '100MG'$  TABS    Sig: Take 3 tablets by mouth 2 (two) times daily for 5 days. (Take nirmatrelvir 150 mg two tablets twice daily for 5 days  and ritonavir 100 mg one tablet twice daily for 5 days) Patient GFR is >60    Dispense:  30 tablet    Refill:  0   There are no discontinued medications.  Orders placed today for conditions managed today: Orders Placed This Encounter  Procedures   POC COVID-19 BinaxNow    Follow-up if needed: No follow-ups on file.  [employer] One speech-to-text software was used for transcription in this dictation.  Possible transcriptional errors can occur using Editor, commissioning.   Signed,  Maud Deed. Damonique Brunelle, MD   Outpatient Encounter Medications as of 01/20/2022  Medication Sig   ascorbic acid (VITAMIN C) 500 MG tablet    aspirin (ASPIRIN 81) 81 MG EC tablet Take 2 tablets (162 mg total) by mouth  daily. Swallow whole.   Cholecalciferol (VITAMIN D-3 PO) Take 50 mcg by mouth daily.    CRANBERRY PO Take by mouth daily.   CREON 36000-114000 units CPEP capsule Take 36,000 Units by mouth 3 (three) times daily.   diclofenac Sodium (VOLTAREN) 1 % GEL Apply topically 4 (four) times daily.   fluticasone (FLONASE) 50 MCG/ACT nasal spray Place 2 sprays into both nostrils daily. After nasal saline   gabapentin (NEURONTIN) 400 MG capsule TAKE 1 CAPSULE BY MOUTH 3  TIMES DAILY   hydrochlorothiazide (HYDRODIURIL) 12.5 MG tablet TAKE 1 TABLET BY MOUTH  DAILY IN THE MORNING   hydrocortisone 2.5 % ointment Apply topically 2 (two) times daily. Prn   KLOR-CON M20 20 MEQ tablet Take 20 mEq by mouth every morning.   levothyroxine (SYNTHROID) 100 MCG tablet Take 1 tablet (100 mcg total) by mouth daily before breakfast.   lidocaine-prilocaine (EMLA) cream Apply topically.   loperamide (IMODIUM) 2 MG capsule Take 2 mg by mouth 3 (three) times daily as needed for diarrhea or loose stools.   Magnesium Cl-Calcium Carbonate (SLOW-MAG PO) Take by mouth. 2 QD   Multiple Vitamins-Minerals (CENTRUM SILVER 50+WOMEN PO) Take by mouth.   nirmatrelvir/ritonavir EUA (PAXLOVID) 20 x 150 MG & 10 x '100MG'$  TABS Take 3 tablets by mouth 2 (two) times daily for 5 days. (Take nirmatrelvir 150 mg two tablets twice daily for 5 days and ritonavir 100 mg one tablet twice daily for 5 days) Patient GFR is >60   ondansetron (ZOFRAN) 4 MG tablet Take by mouth.   Simethicone 125 MG CAPS Take by mouth.   sodium chloride (OCEAN) 0.65 % SOLN nasal spray Place 2 sprays into both nostrils daily as needed for congestion.   TURMERIC CURCUMIN PO Take by mouth 3 (three) times daily with meals.   vitamin B-12 (CYANOCOBALAMIN) 1000 MCG tablet Take 2,000 mcg by mouth daily.   ofloxacin (OCUFLOX) 0.3 % ophthalmic solution 1-2 drops in right eye every 4 hours for 2 days and then 1- 2 drops QID for 5 days in right eye. (Patient not taking: Reported on  01/20/2022)   No facility-administered encounter medications on file as of 01/20/2022.

## 2022-01-20 NOTE — Patient Instructions (Signed)
You have Covid-19  Stop your hydrochlorothiazide while you are sick.

## 2022-01-20 NOTE — Telephone Encounter (Signed)
Pt daughter called stating pt fell this morning and she is dizzy, having hot flashes and seeing double. Sent to access nurse

## 2022-01-27 DIAGNOSIS — Z20822 Contact with and (suspected) exposure to covid-19: Secondary | ICD-10-CM | POA: Diagnosis not present

## 2022-01-29 DIAGNOSIS — H35371 Puckering of macula, right eye: Secondary | ICD-10-CM | POA: Diagnosis not present

## 2022-02-06 ENCOUNTER — Ambulatory Visit: Payer: Medicare Other

## 2022-03-05 ENCOUNTER — Inpatient Hospital Stay: Payer: Medicare Other | Attending: Oncology

## 2022-03-05 DIAGNOSIS — Z452 Encounter for adjustment and management of vascular access device: Secondary | ICD-10-CM | POA: Insufficient documentation

## 2022-03-05 DIAGNOSIS — C250 Malignant neoplasm of head of pancreas: Secondary | ICD-10-CM | POA: Insufficient documentation

## 2022-03-05 DIAGNOSIS — Z95828 Presence of other vascular implants and grafts: Secondary | ICD-10-CM

## 2022-03-05 MED ORDER — SODIUM CHLORIDE 0.9% FLUSH
10.0000 mL | INTRAVENOUS | Status: DC | PRN
  Administered 2022-03-05: 10 mL via INTRAVENOUS
  Filled 2022-03-05: qty 10

## 2022-03-05 MED ORDER — HEPARIN SOD (PORK) LOCK FLUSH 100 UNIT/ML IV SOLN
500.0000 [IU] | Freq: Once | INTRAVENOUS | Status: AC
  Administered 2022-03-05: 500 [IU] via INTRAVENOUS
  Filled 2022-03-05: qty 5

## 2022-03-12 ENCOUNTER — Ambulatory Visit (INDEPENDENT_AMBULATORY_CARE_PROVIDER_SITE_OTHER): Payer: Medicare Other

## 2022-03-12 DIAGNOSIS — Z23 Encounter for immunization: Secondary | ICD-10-CM | POA: Diagnosis not present

## 2022-03-14 ENCOUNTER — Ambulatory Visit: Payer: Medicare Other

## 2022-03-27 ENCOUNTER — Other Ambulatory Visit: Payer: Self-pay | Admitting: Internal Medicine

## 2022-03-27 DIAGNOSIS — I10 Essential (primary) hypertension: Secondary | ICD-10-CM

## 2022-04-15 ENCOUNTER — Ambulatory Visit (LOCAL_COMMUNITY_HEALTH_CENTER): Payer: Medicare Other

## 2022-04-15 ENCOUNTER — Encounter: Payer: Self-pay | Admitting: Oncology

## 2022-04-15 DIAGNOSIS — Z23 Encounter for immunization: Secondary | ICD-10-CM | POA: Diagnosis not present

## 2022-04-15 DIAGNOSIS — Z719 Counseling, unspecified: Secondary | ICD-10-CM

## 2022-04-15 NOTE — Progress Notes (Addendum)
  Are you feeling sick today? No   Have you ever received a dose of COVID-19 Vaccine? [employer], [place], [place], [place], Other) Yes  If yes, which vaccine and how many doses?    Pfizer 5 doses  Did you bring the vaccination record card or other documentation?  No   Do you have a health condition or are undergoing treatment that makes you moderately or severely immunocompromised? This would include, but not be limited to: cancer, HIV, organ transplant, immunosuppressive therapy/high-dose corticosteroids, or moderate/severe primary immunodeficiency.  No States had pancreatic cancer but treatment completed  Have you received COVID-19 vaccine before or during hematopoietic cell transplant (HCT) or CAR-T-cell therapies? No  Have you ever had an allergic reaction to: (This would include a severe allergic reaction or a reaction that caused hives, swelling, or respiratory distress, including wheezing.) A component of a COVID-19 vaccine or a previous dose of COVID-19 vaccine? No   Have you ever had an allergic reaction to another vaccine (other thanCOVID-19 vaccine) or an injectable medication? (This would include a severe allergic reaction or a reaction that caused hives, swelling, or respiratory distress, including wheezing.)   No    Do you have a history of any of the following:  Myocarditis or Pericarditis No  Dermal fillers:  No  Multisystem Inflammatory Syndrome (MIS-C or MIS-A)? No  COVID-19 disease within the past 3 months? No  Vaccinated with monkeypox vaccine in the last 4 weeks? No   Comirnaty 12+ 2023-24 formula given; tolerated well.  VIS given.  [facility] updated and copy to pt.  Had received flu vaccine 03/12/22. Stayed 15 minutes after vaccine given.  Tonny Branch, RN

## 2022-04-16 DIAGNOSIS — M79644 Pain in right finger(s): Secondary | ICD-10-CM | POA: Diagnosis not present

## 2022-04-23 ENCOUNTER — Ambulatory Visit: Payer: Medicare Other | Admitting: Podiatry

## 2022-04-23 ENCOUNTER — Encounter: Payer: Self-pay | Admitting: Podiatry

## 2022-04-23 DIAGNOSIS — L84 Corns and callosities: Secondary | ICD-10-CM | POA: Diagnosis not present

## 2022-04-23 DIAGNOSIS — M2041 Other hammer toe(s) (acquired), right foot: Secondary | ICD-10-CM | POA: Diagnosis not present

## 2022-04-23 NOTE — Progress Notes (Signed)
  Subjective:  Patient ID: Lynn Taylor, female    DOB: 04/28/1946,  MRN: [id number]  Chief Complaint  Patient presents with   Callouses    (np) right foot corn-4th toe    76 y.o. female presents with the above complaint. History confirmed with patient.  Does not always bother her its worse when she wears her Sunday shoes  Objective:  Physical Exam: warm, good capillary refill, no trophic changes or ulcerative lesions, normal DP and PT pulses, normal sensory exam, and bilateral she has lesser hammertoe deformities that are semireducible and a hallux valgus deformity, on the right fourth toe there is a painful hyperkeratotic corn on the dorsal PIPJ.  Assessment:   1. Corn of toe   2. Hammertoe of right foot      Plan:  Patient was evaluated and treated and all questions answered.  We discussed the development of the hyperkeratotic corn as well as the presence of the lesser hammertoe deformities how this contributes to formation of this.  We discussed certain shoe gear that will exacerbate this and trigger changes that can help alleviate it.  We also discussed offloading with silicone pads which I dispensed.  Finally we discussed surgical correction if it did not improve.  She will return to see me as needed.  The lesion was debrided as a courtesy today.  Return if symptoms worsen or fail to improve.

## 2022-04-23 NOTE — Patient Instructions (Signed)
More silicone pads can be purchased from:  [email or web address]  OR can get them from our office  The shoes I was wearing today are made by [employer]

## 2022-04-30 ENCOUNTER — Inpatient Hospital Stay: Payer: Medicare Other | Attending: Oncology

## 2022-04-30 DIAGNOSIS — Z95828 Presence of other vascular implants and grafts: Secondary | ICD-10-CM

## 2022-04-30 DIAGNOSIS — D649 Anemia, unspecified: Secondary | ICD-10-CM | POA: Insufficient documentation

## 2022-04-30 DIAGNOSIS — C250 Malignant neoplasm of head of pancreas: Secondary | ICD-10-CM | POA: Diagnosis not present

## 2022-04-30 MED ORDER — HEPARIN SOD (PORK) LOCK FLUSH 100 UNIT/ML IV SOLN
500.0000 [IU] | Freq: Once | INTRAVENOUS | Status: AC
  Administered 2022-04-30: 500 [IU] via INTRAVENOUS
  Filled 2022-04-30: qty 5

## 2022-04-30 MED ORDER — SODIUM CHLORIDE 0.9% FLUSH
10.0000 mL | Freq: Once | INTRAVENOUS | Status: AC
  Administered 2022-04-30: 10 mL via INTRAVENOUS
  Filled 2022-04-30: qty 10

## 2022-05-19 ENCOUNTER — Ambulatory Visit: Payer: Medicare Other

## 2022-05-20 DIAGNOSIS — H2000 Unspecified acute and subacute iridocyclitis: Secondary | ICD-10-CM | POA: Diagnosis not present

## 2022-05-21 ENCOUNTER — Encounter: Payer: Self-pay | Admitting: Family Medicine

## 2022-05-21 ENCOUNTER — Ambulatory Visit (INDEPENDENT_AMBULATORY_CARE_PROVIDER_SITE_OTHER): Payer: Medicare Other | Admitting: Family Medicine

## 2022-05-21 VITALS — BP 122/80 | HR 63 | Temp 98.1°F | Ht 62.0 in | Wt 148.8 lb

## 2022-05-21 DIAGNOSIS — E559 Vitamin D deficiency, unspecified: Secondary | ICD-10-CM | POA: Diagnosis not present

## 2022-05-21 DIAGNOSIS — E039 Hypothyroidism, unspecified: Secondary | ICD-10-CM | POA: Diagnosis not present

## 2022-05-21 DIAGNOSIS — E876 Hypokalemia: Secondary | ICD-10-CM | POA: Diagnosis not present

## 2022-05-21 DIAGNOSIS — Z13220 Encounter for screening for lipoid disorders: Secondary | ICD-10-CM | POA: Diagnosis not present

## 2022-05-21 DIAGNOSIS — Z23 Encounter for immunization: Secondary | ICD-10-CM

## 2022-05-21 DIAGNOSIS — Z0000 Encounter for general adult medical examination without abnormal findings: Secondary | ICD-10-CM

## 2022-05-21 DIAGNOSIS — R7303 Prediabetes: Secondary | ICD-10-CM

## 2022-05-21 DIAGNOSIS — R748 Abnormal levels of other serum enzymes: Secondary | ICD-10-CM | POA: Diagnosis not present

## 2022-05-21 DIAGNOSIS — J432 Centrilobular emphysema: Secondary | ICD-10-CM

## 2022-05-21 DIAGNOSIS — G629 Polyneuropathy, unspecified: Secondary | ICD-10-CM

## 2022-05-21 DIAGNOSIS — D490 Neoplasm of unspecified behavior of digestive system: Secondary | ICD-10-CM | POA: Diagnosis not present

## 2022-05-21 DIAGNOSIS — I700 Atherosclerosis of aorta: Secondary | ICD-10-CM

## 2022-05-21 LAB — BASIC METABOLIC PANEL
BUN: 15 mg/dL (ref 6–23)
CO2: 27 mEq/L (ref 19–32)
Calcium: 9.6 mg/dL (ref 8.4–10.5)
Chloride: 105 mEq/L (ref 96–112)
Creatinine, Ser: 0.78 mg/dL (ref 0.40–1.20)
GFR: 73.88 mL/min (ref >60.00)
Glucose, Bld: 102 mg/dL — ABNORMAL HIGH (ref 70–99)
Potassium: 4.3 mEq/L (ref 3.5–5.1)
Sodium: 138 mEq/L (ref 135–145)

## 2022-05-21 LAB — VITAMIN B12: Vitamin B-12: 1428 pg/mL — ABNORMAL HIGH (ref 211–911)

## 2022-05-21 NOTE — Patient Instructions (Addendum)
It was a pleasure meeting you today. Thank you for allowing me to take part in your health care.  Our goals for today as we discussed include:  Checking your been vitamin B12 levels today.  You received the pneumonia 20 vaccine today.  This will be your last pneumonia vaccine.  Recommend RSV vaccine.  This can be given at your pharmacy.  I will give you information to review.  Have fun on your cruise.  Bring back lots of pictures.  Stay safe.  If you have any questions or concerns, please do not hesitate to call the office at [phone].  I look forward to our next visit and until then take care and stay safe.  Regards,   Carollee Leitz, MD   [facility]

## 2022-05-21 NOTE — Progress Notes (Addendum)
SUBJECTIVE:   Chief Complaint  Patient presents with   Establish Care    Transfer of Care   HPI Patient presents to clinic to transfer care  No acute concerns.  Hypothyroid Asymptomatic.  Takes Synthroid 100 mcg daily and tolerating well.  Follows with endocrinology, Dr. Andy Gauss  Polyneuropathy No acute concerns today.  Takes Neurontin 400 mg 3 times a day.  Tolerating medication well.  Follows with oncology.  Pancreatic neoplasm No concerns today.  Takes pancrelipase 3 times daily.  Follows with oncology  Hypertension Asymptomatic.  Currently takes hydrochlorothiazide 12.5 mg daily and potassium chloride 10 mEq daily medications tolerating well.  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  Constitutional:      General: She is not in acute distress.    Appearance: She is not ill-appearing.  HENT:     Head: Normocephalic.     Nose: Nose normal.  Eyes:     Conjunctiva/sclera: Conjunctivae normal.  Cardiovascular:     Rate and Rhythm: Normal rate and regular rhythm.     Heart sounds: Normal heart sounds.  Pulmonary:     Effort: Pulmonary effort is normal.     Breath sounds: Normal breath sounds.  Abdominal:     General: Abdomen is flat. Bowel sounds are normal.     Palpations: Abdomen is soft.  Musculoskeletal:        General: Normal range of motion.     Cervical back: Normal range of motion.  Skin:    General: Skin is warm and dry.  Neurological:     Mental Status: She is alert and oriented to person, place, and time. Mental status is at baseline.  Psychiatric:        Mood and Affect: Mood normal.        Behavior: Behavior normal.        Thought Content: Thought content normal.         Judgment: Judgment normal.     ASSESSMENT/PLAN:  Centrilobular emphysema (HCC) Assessment & Plan: Chronic.  Stable.  Centrilobular emphysema noted on CT chest 07/02/2018.  No previous pulmonology evaluation Will continue to monitor if becomes symptomatic can refer to pulmonology.   Atherosclerosis of aorta [facility]) Assessment & Plan: Chronic.  Stable.  Noted on CT of chest 07/02/2018. Not currently on statin therapy.  Recent LDL at goal less than 100 Will discuss statin therapy with patient at next visit. Repeat lipids at next visit.   Hypokalemia Assessment & Plan: Chronic.  Stable.  Likely in the setting of diuretic use. Continue potassium 10 mEq daily Repeat potassium   Orders: -     Basic metabolic panel -     Comprehensive metabolic panel; Future  Elevated vitamin B12 level -     Vitamin B12 -     Vitamin B12; Future  Encounter for administration of vaccine -     Pneumococcal conjugate vaccine 20-valent  Pancreas neoplasm Assessment & Plan: Chronic.  Stable.  Asymptomatic. Follows with Dr. Tasia Catchings, oncology  Orders: -     CBC with Differential/Platelet; Future  Acquired hypothyroidism Assessment & Plan: Chronic.  Stable. Recent TSH 1.39 Continue Synthroid 100  mcg's daily Follows with Dr. Andy Gauss at endocrinology.   Polyneuropathy Assessment & Plan: Chronic.  Stable. Follows with oncology Continue gabapentin 400 mg 3 times daily    Vitamin D deficiency, unspecified Assessment & Plan: Chronic.  Stable.  Currently on vitamin B-12 supplementations. Last vitamin B-12 elevated  greater than 3000. Continue vitamin B12 1000 mcg Mondays Wednesdays and Fridays Will repeat levels, if remains elevated can discontinue supplementation and repeat levels in 3-6 months.  Orders: -     VITAMIN D 25 Hydroxy (Vit-D Deficiency, Fractures); Future  Prediabetes -     Hemoglobin A1c; Future  Lipid screening -     Lipid panel; Future  HCM PCV 20 vaccine  today Recommend RSV vaccination   PDMP reviewed  Return in about 6 months (around 11/20/2022) for annual visit with fasting labs 1 week prior.  Carollee Leitz, MD

## 2022-06-09 ENCOUNTER — Encounter: Payer: Self-pay | Admitting: Family Medicine

## 2022-06-09 DIAGNOSIS — R7989 Other specified abnormal findings of blood chemistry: Secondary | ICD-10-CM | POA: Insufficient documentation

## 2022-06-09 DIAGNOSIS — R748 Abnormal levels of other serum enzymes: Secondary | ICD-10-CM | POA: Insufficient documentation

## 2022-06-09 DIAGNOSIS — Z13220 Encounter for screening for lipoid disorders: Secondary | ICD-10-CM | POA: Insufficient documentation

## 2022-06-09 DIAGNOSIS — I700 Atherosclerosis of aorta: Secondary | ICD-10-CM | POA: Insufficient documentation

## 2022-06-09 DIAGNOSIS — Z23 Encounter for immunization: Secondary | ICD-10-CM | POA: Insufficient documentation

## 2022-06-09 NOTE — Assessment & Plan Note (Signed)
Chronic.  Stable. Recent TSH 1.39 Continue Synthroid 100 mcg's daily Follows with Dr. Andy Gauss at endocrinology.

## 2022-06-09 NOTE — Assessment & Plan Note (Signed)
Chronic.  Stable.  Asymptomatic. Follows with Dr. Tasia Catchings, oncology

## 2022-06-09 NOTE — Assessment & Plan Note (Addendum)
Chronic.  Stable.  Centrilobular emphysema noted on CT chest 07/02/2018.  No previous pulmonology evaluation Will continue to monitor if becomes symptomatic can refer to pulmonology.

## 2022-06-09 NOTE — Assessment & Plan Note (Signed)
Chronic.  Stable.  Noted on CT of chest 07/02/2018. Not currently on statin therapy.  Recent LDL at goal less than 100 Will discuss statin therapy with patient at next visit. Repeat lipids at next visit.

## 2022-06-09 NOTE — Assessment & Plan Note (Deleted)
Chronic.  Stable.  Asymptomatic. Follows with Dr. Tasia Catchings, oncology

## 2022-06-09 NOTE — Assessment & Plan Note (Signed)
Chronic.  Stable.  Currently on vitamin B-12 supplementations. Last vitamin B-12 elevated  greater than 3000. Continue vitamin B12 1000 mcg Mondays Wednesdays and Fridays Will repeat levels, if remains elevated can discontinue supplementation and repeat levels in 3-6 months.

## 2022-06-09 NOTE — Assessment & Plan Note (Signed)
Chronic.  Stable.  Likely in the setting of diuretic use. Continue potassium 10 mEq daily Repeat potassium

## 2022-06-09 NOTE — Assessment & Plan Note (Signed)
Chronic.  Stable. Follows with oncology Continue gabapentin 400 mg 3 times daily

## 2022-06-16 DIAGNOSIS — C250 Malignant neoplasm of head of pancreas: Secondary | ICD-10-CM | POA: Diagnosis not present

## 2022-06-16 DIAGNOSIS — Z79899 Other long term (current) drug therapy: Secondary | ICD-10-CM | POA: Diagnosis not present

## 2022-06-30 ENCOUNTER — Inpatient Hospital Stay: Payer: Medicare Other

## 2022-06-30 ENCOUNTER — Encounter: Payer: Self-pay | Admitting: Oncology

## 2022-06-30 ENCOUNTER — Inpatient Hospital Stay: Payer: Medicare Other | Attending: Oncology | Admitting: Oncology

## 2022-06-30 VITALS — BP 140/61 | HR 65 | Temp 96.3°F

## 2022-06-30 DIAGNOSIS — C250 Malignant neoplasm of head of pancreas: Secondary | ICD-10-CM | POA: Diagnosis not present

## 2022-06-30 DIAGNOSIS — R7989 Other specified abnormal findings of blood chemistry: Secondary | ICD-10-CM | POA: Insufficient documentation

## 2022-06-30 DIAGNOSIS — Z87891 Personal history of nicotine dependence: Secondary | ICD-10-CM | POA: Insufficient documentation

## 2022-06-30 DIAGNOSIS — Z8507 Personal history of malignant neoplasm of pancreas: Secondary | ICD-10-CM

## 2022-06-30 DIAGNOSIS — D649 Anemia, unspecified: Secondary | ICD-10-CM | POA: Insufficient documentation

## 2022-06-30 DIAGNOSIS — I10 Essential (primary) hypertension: Secondary | ICD-10-CM | POA: Insufficient documentation

## 2022-06-30 DIAGNOSIS — G629 Polyneuropathy, unspecified: Secondary | ICD-10-CM | POA: Insufficient documentation

## 2022-06-30 DIAGNOSIS — Z95828 Presence of other vascular implants and grafts: Secondary | ICD-10-CM

## 2022-06-30 LAB — CBC WITH DIFFERENTIAL/PLATELET
Abs Immature Granulocytes: 0.02 10*3/uL (ref 0.00–0.07)
Basophils Absolute: 0 10*3/uL (ref 0.0–0.1)
Basophils Relative: 0 %
Eosinophils Absolute: 0.1 10*3/uL (ref 0.0–0.5)
Eosinophils Relative: 1 %
HCT: 31.4 % — ABNORMAL LOW (ref 36.0–46.0)
Hemoglobin: 10.6 g/dL — ABNORMAL LOW (ref 12.0–15.0)
Immature Granulocytes: 0 %
Lymphocytes Relative: 32 %
Lymphs Abs: 1.7 10*3/uL (ref 0.7–4.0)
MCH: 29.1 pg (ref 26.0–34.0)
MCHC: 33.8 g/dL (ref 30.0–36.0)
MCV: 86.3 fL (ref 80.0–100.0)
Monocytes Absolute: 0.3 10*3/uL (ref 0.1–1.0)
Monocytes Relative: 6 %
Neutro Abs: 3.2 10*3/uL (ref 1.7–7.7)
Neutrophils Relative %: 61 %
Platelets: 181 10*3/uL (ref 150–400)
RBC: 3.64 MIL/uL — ABNORMAL LOW (ref 3.87–5.11)
RDW: 14.8 % (ref 11.5–15.5)
WBC: 5.3 10*3/uL (ref 4.0–10.5)
nRBC: 0 % (ref 0.0–0.2)

## 2022-06-30 LAB — COMPREHENSIVE METABOLIC PANEL
ALT: 17 U/L (ref 0–44)
AST: 23 U/L (ref 15–41)
Albumin: 3.8 g/dL (ref 3.5–5.0)
Alkaline Phosphatase: 46 U/L (ref 38–126)
Anion gap: 8 (ref 5–15)
BUN: 17 mg/dL (ref 8–23)
CO2: 25 mmol/L (ref 22–32)
Calcium: 9 mg/dL (ref 8.9–10.3)
Chloride: 102 mmol/L (ref 98–111)
Creatinine, Ser: 0.74 mg/dL (ref 0.44–1.00)
GFR, Estimated: >60 mL/min (ref >60)
Glucose, Bld: 119 mg/dL — ABNORMAL HIGH (ref 70–99)
Potassium: 4.1 mmol/L (ref 3.5–5.1)
Sodium: 135 mmol/L (ref 135–145)
Total Bilirubin: 0.3 mg/dL (ref 0.3–1.2)
Total Protein: 7.2 g/dL (ref 6.5–8.1)

## 2022-06-30 LAB — IRON AND TIBC
Iron: 72 ug/dL (ref 28–170)
Saturation Ratios: 16 % (ref 10.4–31.8)
TIBC: 454 ug/dL — ABNORMAL HIGH (ref 250–450)
UIBC: 382 ug/dL

## 2022-06-30 LAB — VITAMIN B12: Vitamin B-12: 1385 pg/mL — ABNORMAL HIGH (ref 180–914)

## 2022-06-30 LAB — FERRITIN: Ferritin: 14 ng/mL (ref 11–307)

## 2022-06-30 MED ORDER — POLYSACCHARIDE IRON COMPLEX 150 MG PO CAPS: 150.0000 mg | ORAL_CAPSULE | Freq: Every day | ORAL | 0 refills | Status: DC

## 2022-06-30 MED ORDER — SODIUM CHLORIDE 0.9% FLUSH
10.0000 mL | Freq: Once | INTRAVENOUS | Status: AC
  Administered 2022-06-30: 10 mL via INTRAVENOUS
  Filled 2022-06-30: qty 10

## 2022-06-30 MED ORDER — HEPARIN SOD (PORK) LOCK FLUSH 100 UNIT/ML IV SOLN
500.0000 [IU] | Freq: Once | INTRAVENOUS | Status: AC
  Administered 2022-06-30: 500 [IU] via INTRAVENOUS
  Filled 2022-06-30: qty 5

## 2022-06-30 NOTE — Progress Notes (Signed)
Hematology/Oncology Follow Up Note [phone] [phone]  REASON FOR VISIT  follow-up for pancreatic cancer.  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 Status post 7 out of the 12 planned cycles of mFOLFIRINOX with difficulties and have decided not to proceed with additional chemo.No evidence of metastatic disease on recent CT scan done at [facility]. CA19.9  is stable.  Continue to follow-up for surveillance.  She is 4 years post surgery.  CT scan at [facility] shows no recurrence.  She will see [facility] in 6 months with plan of repeat CT She will return to see me in 1 year.  Normocytic anemia Likely due to previous chemotherapy. Stable hemoglobin. Continue monitor Iron panel shows increased TIBC, low normal end ferritin level, possible early onset of iron deficiency.  Recommend patient to start oral iron supplementation.   Polyneuropathy Continue gabapentin '400mg'$  TID.  Orders Placed This Encounter  Procedures   Cancer antigen 19-9    Standing Status:   Future    Standing Expiration Date:   07/01/2023   CBC with Differential/Platelet    Standing Status:   Future    Standing Expiration Date:   07/01/2023   Comprehensive metabolic panel    Standing Status:   Future    Standing Expiration Date:   07/01/2023   Follow up in 1 year All questions were answered. The patient knows to call the clinic with any problems, questions or concerns.  Earlie Server, MD, PhD [facility] Hematology Oncology 06/30/2022    HISTORY OF PRESENTING ILLNESS:  Lynn Taylor is a  77 y.o.  female with PMH listed below who was referred to me for evaluation of evaluation of elevated ferritin. Patient recently had lab work-up done on 04/09/2018 which showed ferritin level 1877, iron 97, TIBC 340, iron saturation 29, 04/11/2018 CBC showed hemoglobin 13.2, MCV 81, WBC 7.6, platelet counts 246,000. Patient was  referred to hematology for further evaluation of elevated ferritin.   #04/28/2018 Ultrasound of liver was obtained which showed CBD and intrahepatic biliary dilatation Patient wa she is s advised to go to emergency room for further evaluation. Also had hyperbilirubinemia.  Patient was complaining about being jaundiced, pruritus all over. MR abdomen MRCP with and without contrast showed market biliary duct dilatation, secondary to obstruction in the region of pancreatic head.  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 No abdominal adenopathy, liver metastasis or cross vascular involvement.  patient was evaluated by gastroenterology and status post ERCP with stenting. CA 19.9 1173 CEA 3.9 # 05/25/2018  patient was referred to [facility] and s/p whipple resection.Her postop course was c/b Type A pancreatic leak and c.diff. Pathology showed A. Hepatic artery lymph node, excision: One lymph node, negative for malignancy (0/1). B. Biliary stent, removal: Medical device, consistent with stent, gross examination only. C. 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 All margins are negative (closest margin=uncinate, 0.5 mm) Metastatic adenocarcinoma in one of thirty-two lymph nodes (1/32).  Portion of stomach and duodenum with no specific pathologic diagnosis.   Grade, 3 poorly differentiated.  Pathologic pT2 pN1   Cancer Treatment 07/13/2018 Cycle 1 FOLFIRINOX with Onpro Neulasta.  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 10/06/2018 cycle 6  mFOLFIRINOX with Onpro Neulasta - [50% dose for Irinotecan- 5-FU bolus omitted]. 10/20/2018 cycle 7 mFOLFIRINOX with Onpro Neulasta - [50% dose for Irinotecan- 5-FU bolus omitted].  # 04/02/2020 [facility] CT chest abdomen pelvis  Postsurgical changes from Whipple procedure. Minimally increased dilation of the main pancreatic duct, which is nonspecific. Overall stable appearance of the pancreaticojejunostomy. Recommend attention on follow-up.  Decreased, previously prominent right axillary lymph node. No definite findings of new or increased metastatic disease in the chest, abdomen, or pelvis.  05/20/2021, [facility] CT chest abdomen pelvis with contrast showed no evidence of recurrent or metastatic disease in the chest abdomen pelvis.  INTERVAL HISTORY CHIYO FAY is a 77 y.o. female with history of stage IIb pancreatic cancer presents to for follow-up.   Patient was recently seen at [facility] and had a CT scan done there. 11/18/2021 CT chest abdomen pelvis w contrast showed no evidence of recurrent or metastatic disease in the chest, abdomen, or  pelvis.  Denies any unintentional weight loss, fever, chills, diarrhea, abdominal pain.  Her weight is stable.  She has no new complaints   Review of Systems  Constitutional:  Negative for appetite change, chills, fatigue, fever and unexpected weight change.  HENT:   Negative for hearing loss and voice change.   Eyes:  Negative for eye problems.  Respiratory:  Negative for chest tightness, cough and shortness of breath.   Cardiovascular:  Negative for chest pain and leg swelling.  Gastrointestinal:  Negative for abdominal distention, abdominal pain, blood in stool and diarrhea.  Endocrine: Negative for hot flashes.  Genitourinary:  Negative for difficulty  urinating and frequency.   Musculoskeletal:  Negative for arthralgias.  Skin:  Negative for itching and rash.  Neurological:  Positive for numbness. Negative for extremity weakness.  Hematological:  Negative for adenopathy.  Psychiatric/Behavioral:  Negative for confusion.       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   Herniated disc, cervical 04/29/2019   History of deep vein thrombosis (DVT) of lower extremity 12/26/2021   Hyperbilirubinemia 04/28/2018   Hypertension    Hypomagnesemia 11/25/2018   Hypomagnesemia 11/25/2018   Hypothyroidism    Jaundice    Lumbar herniated disc 04/29/2019   Malignant neoplasm of head of pancreas ([place])  06/08/2018   Malignant neoplasm of head of pancreas ([place]) 06/08/2018   Malignant neoplasm of head of pancreas ([place]) 06/08/2018   Personal history of chemotherapy 2019    Pancreatic cancer   Wears dentures    full upper and lower     Past Surgical History:   Procedure Laterality Date   CATARACT EXTRACTION W/PHACO Right 11/27/2021   Procedure: CATARACT EXTRACTION PHACO AND INTRAOCULAR LENS PLACEMENT ([place]) RIGHT VISION BLUE, OMIDRIA  7.91 00:48.1;  Surgeon: Norvel Richards, MD;  Location: [place];  Service: Ophthalmology;  Laterality: Right;   CATARACT EXTRACTION W/PHACO Left 12/18/2021   Procedure: CATARACT EXTRACTION PHACO AND INTRAOCULAR LENS PLACEMENT ([place]) LEFT;  Surgeon: Norvel Richards, MD;  Location: [place];  Service: Ophthalmology;  Laterality: Left;  7.09 0:56.0   CHOLECYSTECTOMY     COLONOSCOPY N/A 12/02/2016   Procedure: COLONOSCOPY;  Surgeon: Lollie Sails, MD;  Location: [facility] ENDOSCOPY;  Service: Endoscopy;  Laterality: N/A;   ERCP N/A 04/30/2018   Procedure: ENDOSCOPIC RETROGRADE CHOLANGIOPANCREATOGRAPHY (ERCP);  Surgeon: Lucilla Lame, MD;  Location: [facility] ENDOSCOPY;  Service: Endoscopy;  Laterality: N/A;   ERCP N/A 05/05/2018   Procedure: ENDOSCOPIC RETROGRADE CHOLANGIOPANCREATOGRAPHY (ERCP);  Surgeon: Lucilla Lame, MD;  Location: [facility] ENDOSCOPY;  Service: Endoscopy;  Laterality: N/A;   PORTA CATH INSERTION N/A 06/28/2018   Procedure: PORTA CATH INSERTION;  Surgeon: Algernon Huxley, MD;  Location: [place] CV LAB;  Service: Cardiovascular;  Laterality: N/A;    Social History   Socioeconomic History   Marital status: Married    Spouse name: Not on file   Number of children: Not on file   Years of education: Not on file   Highest education level: Not on file  Occupational History   Not on file  Tobacco Use   Smoking status: Former    Types: Cigarettes    Quit date: 04/14/1988    Years since quitting: 34.2   Smokeless tobacco: Never  Vaping Use   Vaping Use: Never used  Substance and Sexual Activity   Alcohol use: Yes    Comment: social drinker   Drug use: No   Sexual activity: Not Currently  Other Topics Concern   Not on file  Social History Narrative   Lives at home married     In order of [place] star   Social Determinants of Health   Financial Resource Strain: [place]  (02/05/2021)   Overall Financial Resource Strain (CARDIA)    Difficulty of Paying Living Expenses: Not hard at all  Food Insecurity: No Food Insecurity (02/05/2021)   Hunger Vital Sign    Worried About Running Out of Food in the Last Year: Never true    Protivin in the Last Year: Never true  Transportation Needs: No Transportation Needs (02/05/2021)   PRAPARE - Hydrologist (Medical): No    Lack of Transportation (Non-Medical): No  Physical Activity: Sufficiently Active (02/05/2021)   Exercise Vital Sign    Days of Exercise per Week: 5 days    Minutes of Exercise per Session: 30 min  Stress: No Stress Concern Present (02/05/2021)   [place]    Feeling of Stress : Not at all  Social Connections: Unknown (02/05/2021)   Social Connection and Isolation Panel [NHANES]    Frequency of Communication with Friends and Family: Not on file    Frequency of Social Gatherings with Friends and Family: Not  on file    Attends Religious Services: Not on file    Active Member of Clubs or Organizations: Not on file    Attends Club or Organization Meetings: Not on file    Marital Status: Married  Intimate Partner Violence: Not At Risk (02/05/2021)   Humiliation, Afraid, Rape, and Kick questionnaire    Fear of Current or Ex-Partner: No    Emotionally Abused: No    Physically Abused: No    Sexually Abused: No    Family History  Adopted: Yes  Problem Relation Age of Onset   Diabetes Mellitus I Other    Alcoholism Other    Hypertension Other    Hyperlipidemia Other    Coronary artery disease Other    Stroke Other    Osteoarthritis Other    Migraines Other    Heart Problems Mother    Stroke Sister    CAD Brother    Stroke Brother    Breast cancer Neg Hx      Current Outpatient Medications:     aspirin (ASPIRIN 81) 81 MG EC tablet, Take 2 tablets (162 mg total) by mouth daily. Swallow whole., Disp: 180 tablet, Rfl: 1   Cholecalciferol (VITAMIN D-3 PO), Take 50 mcg by mouth daily. , Disp: , Rfl:    CRANBERRY PO, Take by mouth daily., Disp: , Rfl:    CREON 36000-114000 units CPEP capsule, Take 36,000 Units by mouth 3 (three) times daily., Disp: , Rfl:    fluticasone (FLONASE) 50 MCG/ACT nasal spray, Place 2 sprays into both nostrils daily. After nasal saline, Disp: 48 g, Rfl: 3   gabapentin (NEURONTIN) 400 MG capsule, TAKE 1 CAPSULE BY MOUTH 3  TIMES DAILY, Disp: 270 capsule, Rfl: 3   hydrochlorothiazide (HYDRODIURIL) 12.5 MG tablet, TAKE 1 TABLET BY MOUTH DAILY IN  THE MORNING, Disp: 100 tablet, Rfl: 2   KLOR-CON 10 10 MEQ tablet, Take by mouth., Disp: , Rfl:    levothyroxine (SYNTHROID) 100 MCG tablet, Take 1 tablet (100 mcg total) by mouth daily before breakfast., Disp: 90 tablet, Rfl: 3   Magnesium Cl-Calcium Carbonate (SLOW-MAG PO), Take by mouth. 2 QD, Disp: , Rfl:    Multiple Vitamins-Minerals (CENTRUM SILVER 50+WOMEN PO), Take by mouth., Disp: , Rfl:    sodium chloride (OCEAN) 0.65 % SOLN nasal spray, Place 2 sprays into both nostrils daily as needed for congestion., Disp: 30 mL, Rfl: 2   TURMERIC CURCUMIN PO, Take by mouth 3 (three) times daily with meals., Disp: , Rfl:    vitamin B-12 (CYANOCOBALAMIN) 1000 MCG tablet, Take 1,000 mcg by mouth daily. Take 1 tab Mon, wed and Fri, Disp: , Rfl:    ascorbic acid (VITAMIN C) 500 MG tablet, , Disp: , Rfl:    diclofenac Sodium (VOLTAREN) 1 % GEL, Apply topically 4 (four) times daily. (Patient not taking: Reported on 06/30/2022), Disp: , Rfl:    hydrocortisone 2.5 % ointment, Apply topically 2 (two) times daily. Prn (Patient not taking: Reported on 06/30/2022), Disp: 30 g, Rfl: 0   loperamide (IMODIUM) 2 MG capsule, Take 2 mg by mouth 3 (three) times daily as needed for diarrhea or loose stools. (Patient not taking: Reported on 06/30/2022),  Disp: , Rfl:   Physical exam:  Vitals:   06/30/22 1024  BP: (!) 140/61  Pulse: 65  Temp: (!) 96.3 F (35.7 C)  TempSrc: Tympanic  SpO2: 100%   Physical Exam Constitutional:      General: She is not in acute distress. HENT:  Head: Normocephalic and atraumatic.  Eyes:     General: No scleral icterus.    Pupils: Pupils are equal, round, and reactive to light.  Cardiovascular:     Rate and Rhythm: Normal rate and regular rhythm.     Heart sounds: Normal heart sounds.  Pulmonary:     Effort: Pulmonary effort is normal. No respiratory distress.     Breath sounds: No wheezing.  Abdominal:     General: Bowel sounds are normal. There is no distension.     Palpations: Abdomen is soft. There is no mass.     Tenderness: There is no abdominal tenderness.  Musculoskeletal:        General: No swelling or deformity. Normal range of motion.     Cervical back: Normal range of motion and neck supple.  Skin:    General: Skin is warm and dry.     Findings: No erythema or rash.  Neurological:     Mental Status: She is alert and oriented to person, place, and time. Mental status is at baseline.     Cranial Nerves: No cranial nerve deficit.     Coordination: Coordination normal.  Psychiatric:        Mood and Affect: Mood normal.    Lab results.     Latest Ref Rng & Units 06/30/2022   10:13 AM 12/24/2021    9:59 AM 05/28/2021    9:39 AM  CBC  WBC 4.0 - 10.5 K/uL 5.3  6.2  6.9   Hemoglobin 12.0 - 15.0 g/dL 10.6  11.3  10.4   Hematocrit 36.0 - 46.0 % 31.4  35.0  32.4   Platelets 150 - 400 K/uL 181  193  170       Latest Ref Rng & Units 06/30/2022   10:13 AM 05/21/2022   10:55 AM 12/24/2021    9:59 AM  CMP  Glucose 70 - 99 mg/dL 119  102  104   BUN 8 - 23 mg/dL '17  15  19   '$ Creatinine 0.44 - 1.00 mg/dL 0.74  0.78  0.85   Sodium 135 - 145 mmol/L 135  138  136   Potassium 3.5 - 5.1 mmol/L 4.1  4.3  3.4   Chloride 98 - 111 mmol/L 102  105  107   CO2 22 - 32 mmol/L '25  27  24    '$ Calcium 8.9 - 10.3 mg/dL 9.0  9.6  9.0   Total Protein 6.5 - 8.1 g/dL 7.2   7.6   Total Bilirubin 0.3 - 1.2 mg/dL 0.3   0.7   Alkaline Phos 38 - 126 U/L 46   60   AST 15 - 41 U/L 23   21   ALT 0 - 44 U/L 17   16      RADIOGRAPHIC STUDIES: I have personally reviewed the radiological images as listed and agreed with the findings in the report. [facility] CT report was reviewed.

## 2022-06-30 NOTE — Assessment & Plan Note (Signed)
Continue gabapentin '400mg'$  TID.

## 2022-06-30 NOTE — Addendum Note (Signed)
Addended by: Earlie Server on: 06/30/2022 01:02 PM   Modules accepted: Orders

## 2022-06-30 NOTE — Assessment & Plan Note (Addendum)
#  Stage IIB pancreatic cancer status post Whipple resection [05/25/18]. Status post 7 out of the 12 planned cycles of mFOLFIRINOX with difficulties and have decided not to proceed with additional chemo.No evidence of metastatic disease on recent CT scan done at [facility]. CA19.9  is stable.  Continue to follow-up for surveillance.  She is 4 years post surgery.  CT scan at [facility] shows no recurrence.  She will see [facility] in 6 months with plan of repeat CT She will return to see me in 1 year.

## 2022-06-30 NOTE — Assessment & Plan Note (Addendum)
Likely due to previous chemotherapy. Stable hemoglobin. Continue monitor Iron panel shows increased TIBC, low normal end ferritin level, possible early onset of iron deficiency.  Recommend patient to start oral iron supplementation.

## 2022-07-01 LAB — CANCER ANTIGEN 19-9: CA 19-9: 13 U/mL (ref 0–35)

## 2022-07-02 ENCOUNTER — Other Ambulatory Visit: Payer: Self-pay

## 2022-07-02 DIAGNOSIS — E039 Hypothyroidism, unspecified: Secondary | ICD-10-CM

## 2022-07-02 MED ORDER — LEVOTHYROXINE SODIUM 100 MCG PO TABS: 100.0000 ug | ORAL_TABLET | Freq: Every day | ORAL | 3 refills | Status: DC

## 2022-07-04 DIAGNOSIS — H2000 Unspecified acute and subacute iridocyclitis: Secondary | ICD-10-CM | POA: Diagnosis not present

## 2022-07-11 DIAGNOSIS — I10 Essential (primary) hypertension: Secondary | ICD-10-CM | POA: Diagnosis not present

## 2022-07-11 DIAGNOSIS — E785 Hyperlipidemia, unspecified: Secondary | ICD-10-CM | POA: Diagnosis not present

## 2022-07-11 DIAGNOSIS — E876 Hypokalemia: Secondary | ICD-10-CM | POA: Diagnosis not present

## 2022-07-11 DIAGNOSIS — E890 Postprocedural hypothyroidism: Secondary | ICD-10-CM | POA: Diagnosis not present

## 2022-07-11 DIAGNOSIS — K229 Disease of esophagus, unspecified: Secondary | ICD-10-CM | POA: Diagnosis not present

## 2022-07-11 DIAGNOSIS — E042 Nontoxic multinodular goiter: Secondary | ICD-10-CM | POA: Diagnosis not present

## 2022-07-11 DIAGNOSIS — E559 Vitamin D deficiency, unspecified: Secondary | ICD-10-CM | POA: Diagnosis not present

## 2022-07-11 DIAGNOSIS — C259 Malignant neoplasm of pancreas, unspecified: Secondary | ICD-10-CM | POA: Diagnosis not present

## 2022-07-17 DIAGNOSIS — I10 Essential (primary) hypertension: Secondary | ICD-10-CM | POA: Diagnosis not present

## 2022-07-17 DIAGNOSIS — C259 Malignant neoplasm of pancreas, unspecified: Secondary | ICD-10-CM | POA: Diagnosis not present

## 2022-07-17 DIAGNOSIS — E042 Nontoxic multinodular goiter: Secondary | ICD-10-CM | POA: Diagnosis not present

## 2022-07-17 DIAGNOSIS — E890 Postprocedural hypothyroidism: Secondary | ICD-10-CM | POA: Diagnosis not present

## 2022-07-17 DIAGNOSIS — E785 Hyperlipidemia, unspecified: Secondary | ICD-10-CM | POA: Diagnosis not present

## 2022-07-17 DIAGNOSIS — K229 Disease of esophagus, unspecified: Secondary | ICD-10-CM | POA: Diagnosis not present

## 2022-07-17 DIAGNOSIS — E559 Vitamin D deficiency, unspecified: Secondary | ICD-10-CM | POA: Diagnosis not present

## 2022-08-08 ENCOUNTER — Ambulatory Visit
Admission: RE | Admit: 2022-08-08 | Discharge: 2022-08-08 | Disposition: A | Discharge status: Home / Self Care | Payer: Medicare Other | Source: Ambulatory Visit | Attending: Internal Medicine | Admitting: Internal Medicine

## 2022-08-08 DIAGNOSIS — Z1231 Encounter for screening mammogram for malignant neoplasm of breast: Secondary | ICD-10-CM

## 2022-08-21 ENCOUNTER — Other Ambulatory Visit: Payer: Self-pay

## 2022-08-21 DIAGNOSIS — G629 Polyneuropathy, unspecified: Secondary | ICD-10-CM

## 2022-08-21 MED ORDER — GABAPENTIN 400 MG PO CAPS: 400.0000 mg | ORAL_CAPSULE | Freq: Three times a day (TID) | ORAL | 3 refills | Status: DC

## 2022-08-21 NOTE — Telephone Encounter (Signed)
Previously filled by Dr. Olivia Mackie Last OV: 05/21/2022 Next OV: 11/24/2022

## 2022-09-08 ENCOUNTER — Other Ambulatory Visit: Payer: Self-pay

## 2022-09-08 DIAGNOSIS — G629 Polyneuropathy, unspecified: Secondary | ICD-10-CM

## 2022-09-08 MED ORDER — GABAPENTIN 400 MG PO CAPS: 400.0000 mg | ORAL_CAPSULE | Freq: Three times a day (TID) | ORAL | 3 refills | Status: DC

## 2022-09-09 ENCOUNTER — Encounter: Payer: Self-pay | Admitting: Family Medicine

## 2022-09-09 ENCOUNTER — Encounter: Payer: Self-pay | Admitting: Family

## 2022-09-10 ENCOUNTER — Ambulatory Visit: Payer: Medicare Other | Admitting: Family

## 2022-09-10 ENCOUNTER — Encounter: Payer: Self-pay | Admitting: Family

## 2022-09-10 ENCOUNTER — Ambulatory Visit (INDEPENDENT_AMBULATORY_CARE_PROVIDER_SITE_OTHER): Payer: Medicare Other | Admitting: Family

## 2022-09-10 VITALS — BP 136/86 | HR 74 | Temp 98.2°F | Ht 63.0 in | Wt 153.0 lb

## 2022-09-10 DIAGNOSIS — R252 Cramp and spasm: Secondary | ICD-10-CM | POA: Diagnosis not present

## 2022-09-10 DIAGNOSIS — G629 Polyneuropathy, unspecified: Secondary | ICD-10-CM | POA: Diagnosis not present

## 2022-09-10 LAB — COMPREHENSIVE METABOLIC PANEL
ALT: 15 U/L (ref 0–35)
AST: 19 U/L (ref 0–37)
Albumin: 4.2 g/dL (ref 3.5–5.2)
Alkaline Phosphatase: 56 U/L (ref 39–117)
BUN: 12 mg/dL (ref 6–23)
CO2: 25 mEq/L (ref 19–32)
Calcium: 9.8 mg/dL (ref 8.4–10.5)
Chloride: 108 mEq/L (ref 96–112)
Creatinine, Ser: 0.72 mg/dL (ref 0.40–1.20)
GFR: 81.15 mL/min (ref >60.00)
Glucose, Bld: 80 mg/dL (ref 70–99)
Potassium: 4.1 mEq/L (ref 3.5–5.1)
Sodium: 138 mEq/L (ref 135–145)
Total Bilirubin: 0.3 mg/dL (ref 0.2–1.2)
Total Protein: 7.4 g/dL (ref 6.0–8.3)

## 2022-09-10 LAB — MAGNESIUM: Magnesium: 2 mg/dL (ref 1.5–2.5)

## 2022-09-10 LAB — B12 AND FOLATE PANEL
Folate: >23.8 ng/mL (ref >5.9)
Vitamin B-12: 1146 pg/mL — ABNORMAL HIGH (ref 211–911)

## 2022-09-10 LAB — TSH: TSH: 1.11 u[IU]/mL (ref 0.35–5.50)

## 2022-09-10 MED ORDER — GABAPENTIN 100 MG PO CAPS: 100.0000 mg | ORAL_CAPSULE | Freq: Three times a day (TID) | ORAL | 1 refills | Status: DC

## 2022-09-10 NOTE — Progress Notes (Unsigned)
Assessment & Plan:  There are no diagnoses linked to this encounter.   Return precautions given.   Risks, benefits, and alternatives of the medications and treatment plan prescribed today were discussed, and patient expressed understanding.   Education regarding symptom management and diagnosis given to patient on AVS either electronically or printed.  No follow-ups on file.  Rennie PlowmanMargaret Divon Krabill, FNP  Subjective:    Patient ID: Lynn Taylor, female    DOB: 12-Oct-1945, 77 y.o.   MRN: [id number]  CC: Lynn Sheriffenny S Danek is a 77 y.o. female who presents today for an acute visit.    HPI: Complains of leg cramps, episodic , mostly at night started for  In calves and feet, last for few  Lasts for a few minutes  No leg swelling, cp, sob   She doesn't think she drinking enough water.   She is not on a statin  HTN- compliant with hydrochlorothiazide 12.5 mg daily and potassium chloride 10 mEq daily  .   H/o polyneuropathy- compliant with b12 PO once weekly, gabapentin 400mg  TID. H/o pancreatic cancer S/p whipple and chemotherapy . Numbness all her fingers, only at fingertips which started after chemotherapy No neck pain, arm weakness.   Compliant with synthroid and follows with endocrine, Dr Patrecia PaceMorayati,  TSH 1.39 11/2021 B12 1385 06/2022   Allergies: Other Current Outpatient Medications on File Prior to Visit  Medication Sig Dispense Refill  . ascorbic acid (VITAMIN C) 500 MG tablet     . aspirin (ASPIRIN 81) 81 MG EC tablet Take 2 tablets (162 mg total) by mouth daily. Swallow whole. 180 tablet 1  . Cholecalciferol (VITAMIN D-3 PO) Take 50 mcg by mouth daily.     Marland Kitchen. CRANBERRY PO Take by mouth daily.    Marland Kitchen. CREON 36000-114000 units CPEP capsule Take 36,000 Units by mouth 3 (three) times daily.    . diclofenac Sodium (VOLTAREN) 1 % GEL Apply topically 4 (four) times daily. (Patient not taking: Reported on 06/30/2022)    . fluticasone (FLONASE) 50 MCG/ACT nasal spray Place 2 sprays into both  nostrils daily. After nasal saline 48 g 3  . gabapentin (NEURONTIN) 400 MG capsule Take 1 capsule (400 mg total) by mouth 3 (three) times daily. 270 capsule 3  . hydrochlorothiazide (HYDRODIURIL) 12.5 MG tablet TAKE 1 TABLET BY MOUTH DAILY IN  THE MORNING 100 tablet 2  . hydrocortisone 2.5 % ointment Apply topically 2 (two) times daily. Prn (Patient not taking: Reported on 06/30/2022) 30 g 0  . iron polysaccharides (NIFEREX) 150 MG capsule Take 1 capsule (150 mg total) by mouth daily. 90 capsule 0  . KLOR-CON 10 10 MEQ tablet Take by mouth.    . levothyroxine (SYNTHROID) 100 MCG tablet Take 1 tablet (100 mcg total) by mouth daily before breakfast. 90 tablet 3  . loperamide (IMODIUM) 2 MG capsule Take 2 mg by mouth 3 (three) times daily as needed for diarrhea or loose stools. (Patient not taking: Reported on 06/30/2022)    . Magnesium Cl-Calcium Carbonate (SLOW-MAG PO) Take by mouth. 2 QD    . Multiple Vitamins-Minerals (CENTRUM SILVER 50+WOMEN PO) Take by mouth.    . sodium chloride (OCEAN) 0.65 % SOLN nasal spray Place 2 sprays into both nostrils daily as needed for congestion. 30 mL 2  . TURMERIC CURCUMIN PO Take by mouth 3 (three) times daily with meals.    . vitamin B-12 (CYANOCOBALAMIN) 1000 MCG tablet Take 1,000 mcg by mouth daily. Take 1 tab Mon, wed and  Fri     No current facility-administered medications on file prior to visit.    Review of Systems    Objective:    There were no vitals taken for this visit.  BP Readings from Last 3 Encounters:  06/30/22 (!) 140/61  05/21/22 122/80  01/08/22 138/76   Wt Readings from Last 3 Encounters:  05/21/22 148 lb 12.8 oz (67.5 kg)  01/20/22 147 lb 2 oz (66.7 kg)  12/26/21 149 lb 9.6 oz (67.9 kg)    Physical Exam

## 2022-09-16 NOTE — Assessment & Plan Note (Signed)
Labs without electrolyte derangements.  Discussed hydration, gentle stretching.  She will let us know how she she is doing.

## 2022-09-16 NOTE — Assessment & Plan Note (Signed)
Status post chemotherapy.  Suboptimal control, increase gabapentin from 400 mg 3 times daily to 500 mg 3 times daily.

## 2022-09-16 NOTE — Patient Instructions (Signed)
Increase gabapentin from 400 mg 3 times daily to 500 mg 3 times daily  Gentle stretching and increase fluids for cramping at night.

## 2022-09-17 ENCOUNTER — Telehealth: Payer: Self-pay | Admitting: Family Medicine

## 2022-09-17 NOTE — Telephone Encounter (Signed)
Contacted Lynn Taylor to schedule their annual wellness visit. Appointment made for 09/18/2022.  Thank you,  [facility] Support [facility] Direct dial  [phone]

## 2022-09-18 ENCOUNTER — Ambulatory Visit (INDEPENDENT_AMBULATORY_CARE_PROVIDER_SITE_OTHER): Payer: Medicare Other

## 2022-09-18 VITALS — Ht 63.0 in | Wt 153.0 lb

## 2022-09-18 DIAGNOSIS — Z0000 Encounter for general adult medical examination without abnormal findings: Secondary | ICD-10-CM

## 2022-09-18 NOTE — Patient Instructions (Addendum)
Ms. Lynn Taylor , Thank you for taking time to come for your Medicare Wellness Visit. I appreciate your ongoing commitment to your health goals. Please review the following plan we discussed and let me know if I can assist you in the future.   These are the goals we discussed:  Goals       Patient Stated     Follow up with Provider as scheduled (pt-stated)      Stay hydrated        This is a list of the screening recommended for you and due dates:  Health Maintenance  Topic Date Due   COVID-19 Vaccine (7 - 2023-24 season) 10/04/2022*   Zoster (Shingles) Vaccine (2 of 2) 10/17/2022   Medicare Annual Wellness Visit  09/18/2023   Pneumonia Vaccine  Completed   DEXA scan (bone density measurement)  Completed   Hepatitis C Screening: USPSTF Recommendation to screen - Ages 65-79 yo.  Completed   HPV Vaccine  Aged Out   DTaP/Tdap/Td vaccine  Discontinued   Flu Shot  Discontinued   Colon Cancer Screening  Discontinued  *Topic was postponed. The date shown is not the original due date.   Conditions/risks identified: none new  Next appointment: Follow up in one year for your annual wellness visit    Preventive Care 65 Years and Older, Female Preventive care refers to lifestyle choices and visits with your health care provider that can promote health and wellness. What does preventive care include? A yearly physical exam. This is also called an annual well check. Dental exams once or twice a year. Routine eye exams. Ask your health care provider how often you should have your eyes checked. Personal lifestyle choices, including: Daily care of your teeth and gums. Regular physical activity. Eating a healthy diet. Avoiding tobacco and drug use. Limiting alcohol use. Practicing safe sex. Taking low-dose aspirin every day. Taking vitamin and mineral supplements as recommended by your health care provider. What happens during an annual well check? The services and screenings done by your  health care provider during your annual well check will depend on your age, overall health, lifestyle risk factors, and family history of disease. Counseling  Your health care provider may ask you questions about your: Alcohol use. Tobacco use. Drug use. Emotional well-being. Home and relationship well-being. Sexual activity. Eating habits. History of falls. Memory and ability to understand (cognition). Work and work Astronomer. Reproductive health. Screening  You may have the following tests or measurements: Height, weight, and BMI. Blood pressure. Lipid and cholesterol levels. These may be checked every 5 years, or more frequently if you are over 55 years old. Skin check. Lung cancer screening. You may have this screening every year starting at age 55 if you have a 30-pack-year history of smoking and currently smoke or have quit within the past 15 years. Fecal occult blood test (FOBT) of the stool. You may have this test every year starting at age 84. Flexible sigmoidoscopy or colonoscopy. You may have a sigmoidoscopy every 5 years or a colonoscopy every 10 years starting at age 76. Hepatitis C blood test. Hepatitis B blood test. Sexually transmitted disease (STD) testing. Diabetes screening. This is done by checking your blood sugar (glucose) after you have not eaten for a while (fasting). You may have this done every 1-3 years. Bone density scan. This is done to screen for osteoporosis. You may have this done starting at age 49. Mammogram. This may be done every 1-2 years. Talk to your  health care provider about how often you should have regular mammograms. Talk with your health care provider about your test results, treatment options, and if necessary, the need for more tests. Vaccines  Your health care provider may recommend certain vaccines, such as: Influenza vaccine. This is recommended every year. Tetanus, diphtheria, and acellular pertussis (Tdap, Td) vaccine. You may  need a Td booster every 10 years. Zoster vaccine. You may need this after age 38. Pneumococcal 13-valent conjugate (PCV13) vaccine. One dose is recommended after age 26. Pneumococcal polysaccharide (PPSV23) vaccine. One dose is recommended after age 17. Talk to your health care provider about which screenings and vaccines you need and how often you need them. This information is not intended to replace advice given to you by your health care provider. Make sure you discuss any questions you have with your health care provider. Document Released: 06/22/2015 Document Revised: 02/13/2016 Document Reviewed: 03/27/2015 Elsevier Interactive Patient Education  2017 [place] Prevention in the Home Falls can cause injuries. They can happen to people of all ages. There are many things you can do to make your home safe and to help prevent falls. What can I do on the outside of my home? Regularly fix the edges of walkways and driveways and fix any cracks. Remove anything that might make you trip as you walk through a door, such as a raised step or threshold. Trim any bushes or trees on the path to your home. Use bright outdoor lighting. Clear any walking paths of anything that might make someone trip, such as rocks or tools. Regularly check to see if handrails are loose or broken. Make sure that both sides of any steps have handrails. Any raised decks and porches should have guardrails on the edges. Have any leaves, snow, or ice cleared regularly. Use sand or salt on walking paths during winter. Clean up any spills in your garage right away. This includes oil or grease spills. What can I do in the bathroom? Use night lights. Install grab bars by the toilet and in the tub and shower. Do not use towel bars as grab bars. Use non-skid mats or decals in the tub or shower. If you need to sit down in the shower, use a plastic, non-slip stool. Keep the floor dry. Clean up any water that spills on  the floor as soon as it happens. Remove soap buildup in the tub or shower regularly. Attach bath mats securely with double-sided non-slip rug tape. Do not have throw rugs and other things on the floor that can make you trip. What can I do in the bedroom? Use night lights. Make sure that you have a light by your bed that is easy to reach. Do not use any sheets or blankets that are too big for your bed. They should not hang down onto the floor. Have a firm chair that has side arms. You can use this for support while you get dressed. Do not have throw rugs and other things on the floor that can make you trip. What can I do in the kitchen? Clean up any spills right away. Avoid walking on wet floors. Keep items that you use a lot in easy-to-reach places. If you need to reach something above you, use a strong step stool that has a grab bar. Keep electrical cords out of the way. Do not use floor polish or wax that makes floors slippery. If you must use wax, use non-skid floor wax. Do not have  throw rugs and other things on the floor that can make you trip. What can I do with my stairs? Do not leave any items on the stairs. Make sure that there are handrails on both sides of the stairs and use them. Fix handrails that are broken or loose. Make sure that handrails are as long as the stairways. Check any carpeting to make sure that it is firmly attached to the stairs. Fix any carpet that is loose or worn. Avoid having throw rugs at the top or bottom of the stairs. If you do have throw rugs, attach them to the floor with carpet tape. Make sure that you have a light switch at the top of the stairs and the bottom of the stairs. If you do not have them, ask someone to add them for you. What else can I do to help prevent falls? Wear shoes that: Do not have high heels. Have rubber bottoms. Are comfortable and fit you well. Are closed at the toe. Do not wear sandals. If you use a stepladder: Make sure  that it is fully opened. Do not climb a closed stepladder. Make sure that both sides of the stepladder are locked into place. Ask someone to hold it for you, if possible. Clearly mark and make sure that you can see: Any grab bars or handrails. First and last steps. Where the edge of each step is. Use tools that help you move around (mobility aids) if they are needed. These include: Canes. Walkers. Scooters. Crutches. Turn on the lights when you go into a dark area. Replace any light bulbs as soon as they burn out. Set up your furniture so you have a clear path. Avoid moving your furniture around. If any of your floors are uneven, fix them. If there are any pets around you, be aware of where they are. Review your medicines with your doctor. Some medicines can make you feel dizzy. This can increase your chance of falling. Ask your doctor what other things that you can do to help prevent falls. This information is not intended to replace advice given to you by your health care provider. Make sure you discuss any questions you have with your health care provider. Document Released: 03/22/2009 Document Revised: 11/01/2015 Document Reviewed: 06/30/2014 Elsevier Interactive Patient Education  2017 [employer].

## 2022-09-18 NOTE — Progress Notes (Signed)
Subjective:   Lynn Taylor is a 77 y.o. female who presents for Medicare Annual (Subsequent) preventive examination.  Review of Systems    No ROS.  Medicare Wellness Virtual Visit.  Visual/audio telehealth visit, UTA vital signs.   See social history for additional risk factors.   Cardiac Risk Factors include: advanced age (>41men, >69 women)     Objective:    Today's Vitals   09/18/22 1538  Weight: 153 lb (69.4 kg)  Height: 5\' 3"  (1.6 m)   Body mass index is 27.1 kg/m.     09/18/2022    3:36 PM 06/30/2022   10:31 AM 01/08/2022    1:00 PM 12/26/2021   10:54 AM 12/18/2021    6:35 AM 11/27/2021   11:21 AM 05/28/2021   10:03 AM  Advanced Directives  Does Patient Have a Medical Advance Directive? No Yes No No No No No  Type of Advance Directive  Healthcare Power of Attorney       Would patient like information on creating a medical advance directive? No - Patient declined  No - Patient declined No - Patient declined No - Patient declined No - Patient declined     Current Medications (verified) Outpatient Encounter Medications as of 09/18/2022  Medication Sig   ascorbic acid (VITAMIN C) 500 MG tablet    aspirin (ASPIRIN 81) 81 MG EC tablet Take 2 tablets (162 mg total) by mouth daily. Swallow whole.   Cholecalciferol (VITAMIN D-3 PO) Take 50 mcg by mouth daily.    CRANBERRY PO Take by mouth daily.   CREON 36000-114000 units CPEP capsule Take 36,000 Units by mouth 3 (three) times daily.   diclofenac Sodium (VOLTAREN) 1 % GEL Apply topically 4 (four) times daily. (Patient not taking: Reported on 06/30/2022)   fluticasone (FLONASE) 50 MCG/ACT nasal spray Place 2 sprays into both nostrils daily. After nasal saline   gabapentin (NEURONTIN) 100 MG capsule Take 1 capsule (100 mg total) by mouth 3 (three) times daily.   gabapentin (NEURONTIN) 400 MG capsule Take 1 capsule (400 mg total) by mouth 3 (three) times daily.   hydrochlorothiazide (HYDRODIURIL) 12.5 MG tablet TAKE 1 TABLET BY  MOUTH DAILY IN  THE MORNING   hydrocortisone 2.5 % ointment Apply topically 2 (two) times daily. Prn (Patient not taking: Reported on 06/30/2022)   iron polysaccharides (NIFEREX) 150 MG capsule Take 1 capsule (150 mg total) by mouth daily.   KLOR-CON 10 10 MEQ tablet Take by mouth.   levothyroxine (SYNTHROID) 100 MCG tablet Take 1 tablet (100 mcg total) by mouth daily before breakfast.   loperamide (IMODIUM) 2 MG capsule Take 2 mg by mouth 3 (three) times daily as needed for diarrhea or loose stools. (Patient not taking: Reported on 06/30/2022)   Magnesium Cl-Calcium Carbonate (SLOW-MAG PO) Take by mouth. 2 QD   Multiple Vitamins-Minerals (CENTRUM SILVER 50+WOMEN PO) Take by mouth.   sodium chloride (OCEAN) 0.65 % SOLN nasal spray Place 2 sprays into both nostrils daily as needed for congestion.   TURMERIC CURCUMIN PO Take by mouth 3 (three) times daily with meals.   vitamin B-12 (CYANOCOBALAMIN) 1000 MCG tablet Take 1,000 mcg by mouth daily. Take one 150 mg tablet once a week   No facility-administered encounter medications on file as of 09/18/2022.    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   Herniated disc, cervical 04/29/2019   History of deep vein thrombosis (DVT) of lower extremity 12/26/2021   Hyperbilirubinemia 04/28/2018   Hypertension    Hypomagnesemia 11/25/2018   Hypomagnesemia 11/25/2018   Hypothyroidism    Jaundice    Lumbar herniated disc 04/29/2019   Malignant neoplasm of head of pancreas 06/08/2018   Malignant neoplasm of head of pancreas 06/08/2018   Malignant neoplasm of head of pancreas 06/08/2018   Personal history of chemotherapy 2019    Pancreatic cancer   Wears dentures    full upper and lower   Past Surgical History:  Procedure Laterality Date   CATARACT EXTRACTION W/PHACO Right 11/27/2021   Procedure: CATARACT EXTRACTION PHACO AND INTRAOCULAR LENS PLACEMENT (IOC) RIGHT VISION Disney, OMIDRIA  7.91 00:48.1;  Surgeon: Estanislado Pandy, MD;  Location[facility];  Service: Ophthalmology;  Laterality: Right;   CATARACT EXTRACTION W/PHACO Left 12/18/2021   Procedure: CATARACT EXTRACTION PHACO AND INTRAOCULAR LENS PLACEMENT (IOC) LEFT;  Surgeon: Estanislado Pandy, MD;  Location: [facility];  Service: Ophthalmology;  Laterality: Left;  7.09 0:56.0   CHOLECYSTECTOMY     COLONOSCOPY N/A 12/02/2016   Procedure: COLONOSCOPY;  Surgeon: Christena Deem, MD;  Location: [facility];  Service: Endoscopy;  Laterality: N/A;   ERCP N/A 04/30/2018   Procedure: ENDOSCOPIC RETROGRADE CHOLANGIOPANCREATOGRAPHY (ERCP);  Surgeon: Midge Minium, MD;  Location: [facility];  Service: Endoscopy;  Laterality: N/A;   ERCP N/A 05/05/2018   Procedure: ENDOSCOPIC RETROGRADE CHOLANGIOPANCREATOGRAPHY (ERCP);  Surgeon: Midge Minium, MD;  Location: [facility] END[facility]COPY;  Service: Endoscopy;  Laterality: N/A;   PORTA CATH INSERTION N/A 06/28/2018   Procedure: PORTA CATH INSERTION;  Surgeon: Annice Needy, MD;   Location: [facility] INVASIVE CV LAB;  Service: Cardiovascular;  Laterality: N/A;   Family History  Adopted: Yes  Problem Relation Age of Onset   Diabetes Mellitus I Other    Alcoholism Other    Hypertension Other    Hyperlipidemia Other    Coronary artery disease Other    Stroke Other    Osteoarthritis Other    Migraines Other    Heart Problems Mother    Stroke Sister    CAD Brother    Stroke Brother    Breast cancer Neg Hx    Social History   Socioeconomic History   Marital status: Married    Spouse name: Not on file   Number of children: Not on file   Years of education: Not on file   Highest education level: Not on file  Occupational History   Not on file  Tobacco Use   Smoking status: Former    Types: Cigarettes    Quit date: 04/14/1988    Years since quitting: 34.4   Smokeless tobacco: Never  Vaping Use  Vaping Use: Never used  Substance and Sexual Activity   Alcohol use: Yes    Comment: social drinker   Drug use: No   Sexual activity: Not Currently  Other Topics Concern   Not on file  Social History Narrative   Lives at home married    In order of [place]   Social Determinants of Health   Financial Resource Strain: Low Risk  (09/18/2022)   Overall Financial Resource Strain (CARDIA)    Difficulty of Paying Living Expenses: Not hard at all  Food Insecurity: No Food Insecurity (09/18/2022)   Hunger Vital Sign    Worried About Running Out of Food in the Last Year: Never true    Ran Out of Food in the Last Year: Never true  Transportation Needs: No Transportation Needs (09/18/2022)   PRAPARE - Administrator, Civil ServiceTransportation    Lack of Transportation (Medical): No    Lack of Transportation (Non-Medical): No  Physical Activity: Inactive (09/18/2022)   Exercise Vital Sign    Days of Exercise per Week: 0 days    Minutes of Exercise per Session: 0 min  Stress: No Stress Concern Present (09/18/2022)   [employer] - Occupational Stress Questionnaire     Feeling of Stress : Not at all  Social Connections: Unknown (09/18/2022)   Social Connection and Isolation Panel [NHANES]    Frequency of Communication with Friends and Family: Not on file    Frequency of Social Gatherings with Friends and Family: Not on file    Attends Religious Services: Not on file    Active Member of Clubs or Organizations: Not on file    Attends BankerClub or Organization Meetings: Not on file    Marital Status: Married    Tobacco Counseling Counseling given: Not Answered   Clinical Intake:  Pre-visit preparation completed: Yes        Diabetes: No  How often do you need to have someone help you when you read instructions, pamphlets, or other written materials from your doctor or pharmacy?: 1 - Never  Interpreter Needed?: No      Activities of Daily Living    09/18/2022    3:37 PM 12/18/2021    6:31 AM  In your present state of health, do you have any difficulty performing the following activities:  Hearing? 0 0  Vision? 0 0  Difficulty concentrating or making decisions? 0 0  Walking or climbing stairs? 0 0  Dressing or bathing? 0 0  Doing errands, shopping? 0   Preparing Food and eating ? N   Using the Toilet? N   In the past six months, have you accidently leaked urine? N   Do you have problems with loss of bowel control? N   Managing your Medications? N   Managing your Finances? N   Housekeeping or managing your Housekeeping? N     Patient Care Team: Dana AllanWalsh, Tanya, MD as PCP - General (Family Medicine) Benita GutterStanton, Kristi D, RN as Registered Nurse Wyline MoodAnna, Kiran, MD as Consulting Physician (Gastroenterology) Benita GutterStanton, Kristi D, RN as Oncology Nurse Navigator Rickard PatienceYu, Zhou, MD as Consulting Physician (Oncology) Modesta MessingLidsky, Lodema HongMichael Evan, MD as Referring Physician (Oncology) Wyn Quakerew, Marlow BaarsJason S, MD as Referring Physician (Vascular Surgery)  Indicate any recent Medical Services you may have received from other than [facility] providers in the past year (date may be  approximate).     Assessment:   This is a routine wellness examination for Lynn Taylor.  I connected with  Lynn SheriffPenny S Taylor on 09/18/22  by a audio enabled telemedicine application and verified that I am speaking with the correct person using two identifiers.  Patient Location: Home  Provider Location: Office/Clinic  I discussed the limitations of evaluation and management by telemedicine. The patient expressed understanding and agreed to proceed.   Hearing/Vision screen Hearing Screening - Comments:: Patient is able to hear conversational tones without difficulty.  No issues reported.   Vision Screening - Comments:: Wears corrective lenses.  They have seen their ophthalmologist in the last 12 months.  Dietary issues and exercise activities discussed: Current Exercise Habits: Home exercise routine   Goals Addressed               This Visit's Progress     Patient Stated     Follow up with Provider as scheduled (pt-stated)   On track     Stay hydrated       Depression Screen    09/18/2022    3:36 PM 09/10/2022    1:09 PM 05/21/2022   10:15 AM 11/15/2021    8:09 AM 08/20/2021    9:04 AM 02/05/2021   11:23 AM 02/03/2020    9:44 AM  PHQ 2/9 Scores  PHQ - 2 Score 0 0 0 0 0 0 0  PHQ- 9 Score   0        Fall Risk    09/18/2022    3:37 PM 09/10/2022    1:09 PM 05/21/2022   10:15 AM 11/15/2021    8:08 AM 08/20/2021    9:04 AM  Fall Risk   Falls in the past year? 0 0 0 0 0  Number falls in past yr: 0 0 0 1   Injury with Fall? 0 0 0 0   Risk for fall due to :   No Fall Risks History of fall(s) No Fall Risks  Follow up Falls evaluation completed;Falls prevention discussed Falls evaluation completed Falls evaluation completed Falls evaluation completed Falls evaluation completed    FALL RISK PREVENTION PERTAINING TO THE HOME: Home free of loose throw rugs in walkways, pet beds, electrical cords, etc? Yes  Adequate lighting in your home to reduce risk of falls? Yes   ASSISTIVE  DEVICES UTILIZED TO PREVENT FALLS: Life alert? No  Use of a cane, walker or w/c? No   TIMED UP AND GO: Was the test performed? No .   Cognitive Function:        09/18/2022    3:39 PM  6CIT Screen  What Year? 0 points  What month? 0 points  What time? 0 points  Count back from 20 0 points  Months in reverse 0 points  Repeat phrase 0 points  Total Score 0 points    Immunizations Immunization History  Administered Date(s) Administered   COVID-19, mRNA, vaccine(Comirnaty)12 years and older 04/15/2022   Fluad Quad(high Dose 65+) 05/18/2020, 02/18/2021, 03/12/2022   PFIZER(Purple Top)SARS-COV-2 Vaccination 07/21/2019, 08/11/2019, 03/12/2020, 09/14/2020   PNEUMOCOCCAL CONJUGATE-20 05/21/2022   Pfizer Covid-19 Vaccine Bivalent Booster 27yrs & up 04/05/2021   Pneumococcal Conjugate-13 05/17/2021   Zoster Recombinat (Shingrix) 08/22/2022   Screening Tests Health Maintenance  Topic Date Due   COVID-19 Vaccine (7 - 2023-24 season) 10/04/2022 (Originally 06/10/2022)   Zoster Vaccines- Shingrix (2 of 2) 10/17/2022   Medicare Annual Wellness (AWV)  09/18/2023   Pneumonia Vaccine 69+ Years old  Completed   DEXA SCAN  Completed   Hepatitis C Screening  Completed   HPV VACCINES  Aged Out   DTaP/Tdap/Td  Discontinued  INFLUENZA VACCINE  Discontinued   COLONOSCOPY (Pts 45-64yrs Insurance coverage will need to be confirmed)  Discontinued    Health Maintenance There are no preventive care reminders to display for this patient.  Lung Cancer Screening: (Low Dose CT Chest recommended if Age 60-80 years, 30 pack-year currently smoking OR have quit w/in 15years.) does not qualify.   Hepatitis C Screening: Completed 04/2018.  Vision Screening: Recommended annual ophthalmology exams for early detection of glaucoma and other disorders of the eye.  Dental Screening: Recommended annual dental exams for proper oral hygiene.  Community Resource Referral / Chronic Care Management: CRR  required this visit?  No   CCM required this visit?  No      Plan:     I have personally reviewed and noted the following in the patient's chart:   Medical and social history Use of alcohol, tobacco or illicit drugs  Current medications and supplements including opioid prescriptions. Patient is not currently taking opioid prescriptions. Functional ability and status Nutritional status Physical activity Advanced directives List of other physicians Hospitalizations, surgeries, and ER visits in previous 12 months Vitals Screenings to include cognitive, depression, and falls Referrals and appointments  In addition, I have reviewed and discussed with patient certain preventive protocols, quality metrics, and best practice recommendations. A written personalized care plan for preventive services as well as general preventive health recommendations were provided to patient.     Cathey Endow, LPN   6/55/[id number]

## 2022-10-07 ENCOUNTER — Other Ambulatory Visit: Payer: Self-pay | Admitting: Family

## 2022-10-08 ENCOUNTER — Ambulatory Visit: Payer: Medicare Other | Admitting: Dermatology

## 2022-10-08 VITALS — BP 113/61 | HR 71

## 2022-10-08 DIAGNOSIS — L729 Follicular cyst of the skin and subcutaneous tissue, unspecified: Secondary | ICD-10-CM

## 2022-10-08 DIAGNOSIS — L720 Epidermal cyst: Secondary | ICD-10-CM

## 2022-10-08 NOTE — Patient Instructions (Addendum)
 Pre-Operative Instructions  You are scheduled for a surgical procedure at [facility]. We recommend you read the following instructions. If you have any questions or concerns, please call the office at [phone].  Shower and wash the entire body with soap and water the day of your surgery paying special attention to cleansing at and around the planned surgery site.  Avoid aspirin or aspirin containing products at least fourteen (14) days prior to your surgical procedure and for at least one week (7 Days) after your surgical procedure. If you take aspirin on a regular basis for heart disease or history of stroke or for any other reason, we may recommend you continue taking aspirin but please notify us if you take this on a regular basis. Aspirin can cause more bleeding to occur during surgery as well as prolonged bleeding and bruising after surgery.   Avoid other nonsteroidal pain medications at least one week prior to surgery and at least one week prior to your surgery. These include medications such as Ibuprofen (Motrin, Advil and Nuprin), Naprosyn, Voltaren, Relafen, etc. If medications are used for therapeutic reasons, please inform us as they can cause increased bleeding or prolonged bleeding during and bruising after surgical procedures.   Please advise us if you are taking any "blood thinner" medications such as Coumadin or Dipyridamole or Plavix or similar medications. These cause increased bleeding and prolonged bleeding during procedures and bruising after surgical procedures. We may have to consider discontinuing these medications briefly prior to and shortly after your surgery if safe to do so.   Please inform us of all medications you are currently taking. All medications that are taken regularly should be taken the day of surgery as you always do. Nevertheless, we need to be informed of what medications you are taking prior to surgery to know whether they will affect the  procedure or cause any complications.   Please inform us of any medication allergies. Also inform us of whether you have allergies to Latex or rubber products or whether you have had any adverse reaction to Lidocaine or Epinephrine.  Please inform us of any prosthetic or artificial body parts such as artificial heart valve, joint replacements, etc., or similar condition that might require preoperative antibiotics.   We recommend avoidance of alcohol at least two weeks prior to surgery and continued avoidance for at least two weeks after surgery.   We recommend discontinuation of tobacco smoking at least two weeks prior to surgery and continued abstinence for at least two weeks after surgery.  Do not plan strenuous exercise, strenuous work or strenuous lifting for approximately four weeks after your surgery.   We request if you are unable to make your scheduled surgical appointment, please call us at least a week in advance or as soon as you are aware of a problem so that we can cancel or reschedule the appointment.   You MAY TAKE TYLENOL (acetaminophen) for pain as it is not a blood thinner.   PLEASE PLAN TO BE IN TOWN FOR TWO WEEKS FOLLOWING SURGERY, THIS IS IMPORTANT SO YOU CAN BE CHECKED FOR DRESSING CHANGES, SUTURE REMOVAL AND TO MONITOR FOR POSSIBLE COMPLICATIONS.    Due to recent changes in healthcare laws, you may see results of your pathology and/or laboratory studies on MyChart before the doctors have had a chance to review them. We understand that in some cases there may be results that are confusing or concerning to you. Please understand that not all results are   received at the same time and often the doctors may need to interpret multiple results in order to provide you with the best plan of care or course of treatment. Therefore, we ask that you please give us 2 business days to thoroughly review all your results before contacting the office for clarification. Should we see a  critical lab result, you will be contacted sooner.   If You Need Anything After Your Visit  If you have any questions or concerns for your doctor, please call our main line at [phone] and press option 4 to reach your doctor's medical assistant. If no one answers, please leave a voicemail as directed and we will return your call as soon as possible. Messages left after 4 pm will be answered the following business day.   You may also send us a message via MyChart. We typically respond to MyChart messages within 1-2 business days.  For prescription refills, please ask your pharmacy to contact our office. Our fax number is [phone].  If you have an urgent issue when the clinic is closed that cannot wait until the next business day, you can page your doctor at the number below.    Please note that while we do our best to be available for urgent issues outside of office hours, we are not available 24/7.   If you have an urgent issue and are unable to reach us, you may choose to seek medical care at your doctor's office, retail clinic, urgent care center, or emergency room.  If you have a medical emergency, please immediately call 911 or go to the emergency department.  Pager Numbers  - Dr. Kowalski: [phone]  - Dr. Moye: [phone]  - Dr. Stewart: [phone]  In the event of inclement weather, please call our main line at [phone] for an update on the status of any delays or closures.  Dermatology Medication Tips: Please keep the boxes that topical medications come in in order to help keep track of the instructions about where and how to use these. Pharmacies typically print the medication instructions only on the boxes and not directly on the medication tubes.   If your medication is too expensive, please contact our office at [phone] option 4 or send us a message through MyChart.   We are unable to tell what your co-pay for medications will be in advance as  this is different depending on your insurance coverage. However, we may be able to find a substitute medication at lower cost or fill out paperwork to get insurance to cover a needed medication.   If a prior authorization is required to get your medication covered by your insurance company, please allow us 1-2 business days to complete this process.  Drug prices often vary depending on where the prescription is filled and some pharmacies may offer cheaper prices.  The website www.goodrx.com contains coupons for medications through different pharmacies. The prices here do not account for what the cost may be with help from insurance (it may be cheaper with your insurance), but the website can give you the price if you did not use any insurance.  - You can print the associated coupon and take it with your prescription to the pharmacy.  - You may also stop by our office during regular business hours and pick up a GoodRx coupon card.  - If you need your prescription sent electronically to a different pharmacy, notify our office through [facility] MyChart or by phone at [phone] option 4.       Si Usted Necesita Algo Despus de Su Visita  Tambin puede enviarnos un mensaje a travs de MyChart. Por lo general respondemos a los mensajes de MyChart en el transcurso de 1 a 2 das hbiles.  Para renovar recetas, por favor pida a su farmacia que se ponga en contacto con nuestra oficina. Nuestro nmero de fax es el [phone].  Si tiene un asunto urgente cuando la clnica est cerrada y que no puede esperar hasta el siguiente da hbil, puede llamar/localizar a su doctor(a) al nmero que aparece a continuacin.   Por favor, tenga en cuenta que aunque hacemos todo lo posible para estar disponibles para asuntos urgentes fuera del horario de oficina, no estamos disponibles las 24 horas del da, los 7 das de la semana.   Si tiene un problema urgente y no puede comunicarse con nosotros, puede optar por  buscar atencin mdica  en el consultorio de su doctor(a), en una clnica privada, en un centro de atencin urgente o en una sala de emergencias.  Si tiene una emergencia mdica, por favor llame inmediatamente al 911 o vaya a la sala de emergencias.  Nmeros de bper  - Dr. Kowalski: [phone]  - Dra. Moye: [phone]  - Dra. Stewart: [phone]  En caso de inclemencias del tiempo, por favor llame a nuestra lnea principal al [phone] para una actualizacin sobre el estado de cualquier retraso o cierre.  Consejos para la medicacin en dermatologa: Por favor, guarde las cajas en las que vienen los medicamentos de uso tpico para ayudarle a seguir las instrucciones sobre dnde y cmo usarlos. Las farmacias generalmente imprimen las instrucciones del medicamento slo en las cajas y no directamente en los tubos del medicamento.   Si su medicamento es muy caro, por favor, pngase en contacto con nuestra oficina llamando al [phone] y presione la opcin 4 o envenos un mensaje a travs de MyChart.   No podemos decirle cul ser su copago por los medicamentos por adelantado ya que esto es diferente dependiendo de la cobertura de su seguro. Sin embargo, es posible que podamos encontrar un medicamento sustituto a menor costo o llenar un formulario para que el seguro cubra el medicamento que se considera necesario.   Si se requiere una autorizacin previa para que su compaa de seguros cubra su medicamento, por favor permtanos de 1 a 2 das hbiles para completar este proceso.  Los precios de los medicamentos varan con frecuencia dependiendo del lugar de dnde se surte la receta y alguna farmacias pueden ofrecer precios ms baratos.  El sitio web www.goodrx.com tiene cupones para medicamentos de diferentes farmacias. Los precios aqu no tienen en cuenta lo que podra costar con la ayuda del seguro (puede ser ms barato con su seguro), pero el sitio web puede darle el precio si no  utiliz ningn seguro.  - Puede imprimir el cupn correspondiente y llevarlo con su receta a la farmacia.  - Tambin puede pasar por nuestra oficina durante el horario de atencin regular y recoger una tarjeta de cupones de GoodRx.  - Si necesita que su receta se enve electrnicamente a una farmacia diferente, informe a nuestra oficina a travs de MyChart de [place] o por telfono llamando al [phone] y presione la opcin 4.  

## 2022-10-08 NOTE — Progress Notes (Signed)
   Follow-Up Visit   Subjective  Lynn Taylor is a 76 y.o. female who presents for the following: Spots on the face and back. Bothersome to patient and she would like removed. Spot on back is itchy.    The following portions of the chart were reviewed this encounter and updated as appropriate: medications, allergies, medical history  Review of Systems:  No other skin or systemic complaints except as noted in HPI or Assessment and Plan.  Objective  Well appearing patient in no apparent distress; mood and affect are within normal limits.  A focused examination was performed of the following areas: Face, back  Relevant physical exam findings are noted in the Assessment and Plan.    Assessment & Plan   EPIDERMAL INCLUSION CYST Exam: Firm subcutaneous nodule at left perioral cheek, 0.7 CM; firm subcutaneous nodule at left spinal upper back, 1.2 CM  Benign-appearing. Cyst with symptoms and/or recent change.  Discussed surgical excision to remove, including resulting scar and possible recurrence.  Patient will schedule for surgery. Pre-op information given.      Return as scheduled for cyst excisions.  ICherlyn Labella, CMA, am acting as scribe for Willeen Niece, MD .   Documentation: I have reviewed the above documentation for accuracy and completeness, and I agree with the above.  Willeen Niece, MD

## 2022-10-31 ENCOUNTER — Encounter: Payer: Self-pay | Admitting: Family Medicine

## 2022-11-05 ENCOUNTER — Encounter: Payer: Self-pay | Admitting: Dermatology

## 2022-11-05 ENCOUNTER — Ambulatory Visit: Payer: Medicare Other | Admitting: Dermatology

## 2022-11-05 VITALS — BP 134/73 | HR 72

## 2022-11-05 DIAGNOSIS — D492 Neoplasm of unspecified behavior of bone, soft tissue, and skin: Secondary | ICD-10-CM

## 2022-11-05 DIAGNOSIS — L720 Epidermal cyst: Secondary | ICD-10-CM

## 2022-11-05 NOTE — Patient Instructions (Signed)
Wound Care Instructions for After Surgery  On the day following your surgery, you should begin doing daily dressing changes until your sutures are removed: Remove the bandage. Cleanse the wound gently with soap and water.  Make sure you then dry the skin surrounding the wound completely or the tape will not stick to the skin. Do not use cotton balls on the wound. After the wound is clean and dry, apply the ointment (either prescription antibiotic prescribed by your doctor or plain Vaseline if nothing was prescribed) gently with a Q-tip. If you are using a bandaid to cover: Apply a bandaid large enough to cover the entire wound. If you do not have a bandaid large enough to cover the wound OR if you are sensitive to bandaid adhesive: Cut a non-stick pad (such as Telfa) to fit the size of the wound.  Cover the wound with the non-stick pad. If the wound is draining, you may want to add a small amount of gauze on top of the non-stick pad for a little added compression to the area. Use tape to seal the area completely.  For the next 1-2 weeks: Be sure to keep the wound moist with ointment 24/7 to ensure best healing. If you are unable to cover the wound with a bandage to hold the ointment in place, you may need to reapply the ointment several times a day. Do not bend over or lift heavy items to reduce the chance of elevated blood pressure to the wound. Do not participate in particularly strenuous activities.  Below is a list of dressing supplies you might need.  Cotton-tipped applicators - Q-tips Gauze pads (2x2 and/or 4x4) - [employer]pose Sponges New and clean tube of petroleum jelly (Vaseline) OR prescription antibiotic ointment if prescribed Either a bandaid large enough to cover the entire wound OR non-stick dressing material (Telfa) and Tape (Paper or Hypafix)  FOR ADULT SURGERY PATIENTS: If you need something for pain relief, you may take 1 extra strength Tylenol (acetaminophen) and 2  ibuprofen (200 mg) together every 4 hours as needed. (Do not take these medications if you are allergic to them or if you know you cannot take them for any other reason). Typically you may only need pain medication for 1-3 days.   Comments on the Post-Operative Period Slight swelling and redness often appear around the wound. This is normal and will disappear within several days following the surgery. The healing wound will drain a brownish-red-yellow discharge during healing. This is a normal phase of wound healing. As the wound begins to heal, the drainage may increase in amount. Again, this drainage is normal. Notify us if the drainage becomes persistently bloody, excessively swollen, or intensely painful or develops a foul odor or red streaks.  The healing wound will also typically be itchy. This is normal. If you have severe or persistent pain, Notify us if the discomfort is severe or persistent. Avoid alcoholic beverages when taking pain medicine.  In Case of Wound Hemorrhage A wound hemorrhage is when the bandage suddenly becomes soaked with bright red blood and flows profusely. If this happens, sit down or lie down with your head elevated. If the wound has a dressing on it, do not remove the dressing. Apply pressure to the existing gauze. If the wound is not covered, use a gauze pad to apply pressure and continue applying the pressure for 20 minutes without peeking. DO NOT COVER THE WOUND WITH A LARGE TOWEL OR WASH CLOTH. Release your hand from the   wound site but do not remove the dressing. If the bleeding has stopped, gently clean around the wound. Leave the dressing in place for 24 hours if possible. This wait time allows the blood vessels to close off so that you do not spark a new round of bleeding by disrupting the newly clotted blood vessels with an immediate dressing change. If the bleeding does not subside, continue to hold pressure for 40 minutes. If bleeding continues, page your  physician, contact an After Hours clinic or go to the Emergency Room.   Due to recent changes in healthcare laws, you may see results of your pathology and/or laboratory studies on MyChart before the doctors have had a chance to review them. We understand that in some cases there may be results that are confusing or concerning to you. Please understand that not all results are received at the same time and often the doctors may need to interpret multiple results in order to provide you with the best plan of care or course of treatment. Therefore, we ask that you please give us 2 business days to thoroughly review all your results before contacting the office for clarification. Should we see a critical lab result, you will be contacted sooner.   If You Need Anything After Your Visit  If you have any questions or concerns for your doctor, please call our main line at [phone] and press option 4 to reach your doctor's medical assistant. If no one answers, please leave a voicemail as directed and we will return your call as soon as possible. Messages left after 4 pm will be answered the following business day.   You may also send us a message via MyChart. We typically respond to MyChart messages within 1-2 business days.  For prescription refills, please ask your pharmacy to contact our office. Our fax number is [phone].  If you have an urgent issue when the clinic is closed that cannot wait until the next business day, you can page your doctor at the number below.    Please note that while we do our best to be available for urgent issues outside of office hours, we are not available 24/7.   If you have an urgent issue and are unable to reach us, you may choose to seek medical care at your doctor's office, retail clinic, urgent care center, or emergency room.  If you have a medical emergency, please immediately call 911 or go to the emergency department.  Pager Numbers  - Dr. Kowalski:  [phone]  - Dr. Moye: [phone]  - Dr. Stewart: [phone]  In the event of inclement weather, please call our main line at [phone] for an update on the status of any delays or closures.  Dermatology Medication Tips: Please keep the boxes that topical medications come in in order to help keep track of the instructions about where and how to use these. Pharmacies typically print the medication instructions only on the boxes and not directly on the medication tubes.   If your medication is too expensive, please contact our office at [phone] option 4 or send us a message through MyChart.   We are unable to tell what your co-pay for medications will be in advance as this is different depending on your insurance coverage. However, we may be able to find a substitute medication at lower cost or fill out paperwork to get insurance to cover a needed medication.   If a prior authorization is required to get your medication covered   by your insurance company, please allow us 1-2 business days to complete this process.  Drug prices often vary depending on where the prescription is filled and some pharmacies may offer cheaper prices.  The website www.goodrx.com contains coupons for medications through different pharmacies. The prices here do not account for what the cost may be with help from insurance (it may be cheaper with your insurance), but the website can give you the price if you did not use any insurance.  - You can print the associated coupon and take it with your prescription to the pharmacy.  - You may also stop by our office during regular business hours and pick up a GoodRx coupon card.  - If you need your prescription sent electronically to a different pharmacy, notify our office through [facility] MyChart or by phone at [phone] option 4.     Si Usted Necesita Algo Despus de Su Visita  Tambin puede enviarnos un mensaje a travs de MyChart. Por lo general  respondemos a los mensajes de MyChart en el transcurso de 1 a 2 das hbiles.  Para renovar recetas, por favor pida a su farmacia que se ponga en contacto con nuestra oficina. Nuestro nmero de fax es el [phone].  Si tiene un asunto urgente cuando la clnica est cerrada y que no puede esperar hasta el siguiente da hbil, puede llamar/localizar a su doctor(a) al nmero que aparece a continuacin.   Por favor, tenga en cuenta que aunque hacemos todo lo posible para estar disponibles para asuntos urgentes fuera del horario de oficina, no estamos disponibles las 24 horas del da, los 7 das de la semana.   Si tiene un problema urgente y no puede comunicarse con nosotros, puede optar por buscar atencin mdica  en el consultorio de su doctor(a), en una clnica privada, en un centro de atencin urgente o en una sala de emergencias.  Si tiene una emergencia mdica, por favor llame inmediatamente al 911 o vaya a la sala de emergencias.  Nmeros de bper  - Dr. Kowalski: [phone]  - Dra. Moye: [phone]  - Dra. Stewart: [phone]  En caso de inclemencias del tiempo, por favor llame a nuestra lnea principal al [phone] para una actualizacin sobre el estado de cualquier retraso o cierre.  Consejos para la medicacin en dermatologa: Por favor, guarde las cajas en las que vienen los medicamentos de uso tpico para ayudarle a seguir las instrucciones sobre dnde y cmo usarlos. Las farmacias generalmente imprimen las instrucciones del medicamento slo en las cajas y no directamente en los tubos del medicamento.   Si su medicamento es muy caro, por favor, pngase en contacto con nuestra oficina llamando al [phone] y presione la opcin 4 o envenos un mensaje a travs de MyChart.   No podemos decirle cul ser su copago por los medicamentos por adelantado ya que esto es diferente dependiendo de la cobertura de su seguro. Sin embargo, es posible que podamos encontrar un  medicamento sustituto a menor costo o llenar un formulario para que el seguro cubra el medicamento que se considera necesario.   Si se requiere una autorizacin previa para que su compaa de seguros cubra su medicamento, por favor permtanos de 1 a 2 das hbiles para completar este proceso.  Los precios de los medicamentos varan con frecuencia dependiendo del lugar de dnde se surte la receta y alguna farmacias pueden ofrecer precios ms baratos.  El sitio web www.goodrx.com tiene cupones para medicamentos de diferentes farmacias. Los precios aqu no   tienen en cuenta lo que podra costar con la ayuda del seguro (puede ser ms barato con su seguro), pero el sitio web puede darle el precio si no utiliz ningn seguro.  - Puede imprimir el cupn correspondiente y llevarlo con su receta a la farmacia.  - Tambin puede pasar por nuestra oficina durante el horario de atencin regular y recoger una tarjeta de cupones de GoodRx.  - Si necesita que su receta se enve electrnicamente a una farmacia diferente, informe a nuestra oficina a travs de MyChart de  o por telfono llamando al [phone] y presione la opcin 4.   

## 2022-11-05 NOTE — Progress Notes (Signed)
   Follow-Up Visit   Subjective  Lynn Taylor is a 77 y.o. female who presents for the following: Excision of probable cyst at left spinal upper back.   The following portions of the chart were reviewed this encounter and updated as appropriate: medications, allergies, medical history  Review of Systems:  No other skin or systemic complaints except as noted in HPI or Assessment and Plan.  Objective  Well appearing patient in no apparent distress; mood and affect are within normal limits.  A focused examination was performed of the following areas: back Relevant physical exam findings are noted in the Assessment and Plan.   Left Spinal Upper Back 1.4 x 1.1 cm cystic nodule with central punctum    Assessment & Plan   Neoplasm of skin Left Spinal Upper Back  Skin excision  Lesion length (cm):  1.4 Lesion width (cm):  1.1 Margin per side (cm):  0.1 Total excision diameter (cm):  1.6 Informed consent: discussed and consent obtained   Timeout: patient name, date of birth, surgical site, and procedure verified   Procedure prep:  Patient was prepped and draped in usual sterile fashion Prep type:  Povidone-iodine Anesthesia: the lesion was anesthetized in a standard fashion   Anesthetic:  1% lidocaine w/ epinephrine 1-100,000 buffered w/ 8.4% NaHCO3 (Total 12cc - 6cc lido w/epi, 6cc bupivacaine) Instrument used: #15 blade   Hemostasis achieved with: pressure and electrodesiccation   Outcome: patient tolerated procedure well with no complications    Skin repair Complexity:  Intermediate Final length (cm):  2.6 Informed consent: discussed and consent obtained   Reason for type of repair: reduce tension to allow closure, reduce the risk of dehiscence, infection, and necrosis, reduce subcutaneous dead space and avoid a hematoma, allow closure of the large defect, preserve normal anatomy, preserve normal anatomical and functional relationships and enhance both functionality and  cosmetic results   Undermining: edges undermined   Undermining comment:  1 cm Subcutaneous layers (deep stitches):  Suture size:  3-0 Suture type: Vicryl (polyglactin 910)   Subcutaneous suture technique: inverted dermal. Fine/surface layer approximation (top stitches):  Suture size:  3-0 Suture type: nylon   Stitches: simple interrupted   Suture removal (days):  7 Hemostasis achieved with: suture and pressure Hemostasis achieved with comment:  Electrocautery Outcome: patient tolerated procedure well with no complications   Post-procedure details: sterile dressing applied and wound care instructions given   Dressing type: bandage and pressure dressing (mupirocin)    Specimen 1 - Surgical pathology Differential Diagnosis: R/O epidermal cyst  Check Margins: No     Return in about 1 week (around 11/12/2022) for Suture Removal.  I, Lawson Radar, CMA, am acting as scribe for Willeen Niece, MD.   Documentation: I have reviewed the above documentation for accuracy and completeness, and I agree with the above.  Willeen Niece, MD

## 2022-11-06 ENCOUNTER — Telehealth: Payer: Self-pay

## 2022-11-06 NOTE — Telephone Encounter (Signed)
Patient doing well after surgery.

## 2022-11-12 ENCOUNTER — Ambulatory Visit: Payer: Medicare Other | Admitting: Dermatology

## 2022-11-12 VITALS — BP 122/68

## 2022-11-12 DIAGNOSIS — L720 Epidermal cyst: Secondary | ICD-10-CM

## 2022-11-12 DIAGNOSIS — D485 Neoplasm of uncertain behavior of skin: Secondary | ICD-10-CM

## 2022-11-12 DIAGNOSIS — L729 Follicular cyst of the skin and subcutaneous tissue, unspecified: Secondary | ICD-10-CM

## 2022-11-12 NOTE — Progress Notes (Signed)
Follow-Up Visit   Subjective  Lynn Taylor is a 77 y.o. female who presents for the following: Cyst bx proven L upper back, pt presents for suture removal, Cyst vs other, L perioral cheek, pt presents for excision   The following portions of the chart were reviewed this encounter and updated as appropriate: medications, allergies, medical history  Review of Systems:  No other skin or systemic complaints except as noted in HPI or Assessment and Plan.  Objective  Well appearing patient in no apparent distress; mood and affect are within normal limits.   A focused examination was performed of the following areas: Face, back  Relevant exam findings are noted in the Assessment and Plan.  Left perioral cheek Firm SQ nodule 0.8 x 0.9cm    Assessment & Plan     Neoplasm of uncertain behavior of skin Left perioral cheek  Skin excision  Lesion length (cm):  0.8 Lesion width (cm):  0.9 Margin per side (cm):  0.1 Total excision diameter (cm):  1.1 Informed consent: discussed and consent obtained   Timeout: patient name, date of birth, surgical site, and procedure verified   Procedure prep:  Patient was prepped and draped in usual sterile fashion Prep type:  Povidone-iodine Anesthesia: the lesion was anesthetized in a standard fashion   Anesthetic:  1% lidocaine w/ epinephrine 1-100,000 buffered w/ 8.4% NaHCO3 (6cc lico w/ epi) Instrument used comment:  #15c blade Hemostasis achieved with: pressure and electrodesiccation   Outcome: patient tolerated procedure well with no complications    Skin repair Complexity:  Complex Final length (cm):  1.2 Informed consent: discussed and consent obtained   Reason for type of repair: reduce tension to allow closure, reduce the risk of dehiscence, infection, and necrosis, reduce subcutaneous dead space and avoid a hematoma, allow closure of the large defect, preserve normal anatomy, preserve normal anatomical and functional relationships  and enhance both functionality and cosmetic results   Undermining: area extensively undermined   Undermining comment:  Undermining defect 1.1cm Subcutaneous layers (deep stitches):  Suture size:  5-0 Suture type: Vicryl (polyglactin 910)   Subcutaneous suture technique: inverted dermal. Fine/surface layer approximation (top stitches):  Suture size:  5-0 Suture type: nylon   Stitches: simple interrupted   Suture removal (days):  7 Hemostasis achieved with: pressure Hemostasis achieved with comment:  Electrocautery Outcome: patient tolerated procedure well with no complications   Post-procedure details: sterile dressing applied and wound care instructions given   Dressing type: bandage and pressure dressing (mupirocin)    Specimen 1 - Surgical pathology Differential Diagnosis: Cyst vs other Check Margins: Yes  Symptomatic, irritating, patient would like removed.   Cyst of skin   EPIDERMAL INCLUSION CYST Bx proven Exam: healing excision site, L upper back  Treatment:  Encounter for Removal of Sutures - Incision site at the L upper back is clean, dry and intact - Wound cleansed, sutures removed, wound cleansed and steri strips applied.  - Discussed pathology results showing Cyst  - Patient advised to keep steri-strips dry until they fall off. - Scars remodel for a full year. - Once steri-strips fall off, patient can apply over-the-counter silicone scar cream each night to help with scar remodeling if desired. - Patient advised to call with any concerns or if they notice any new or changing lesions.    Return in about 1 week (around 11/19/2022) for suture removal.  I, Ardis Rowan, RMA, am acting as scribe for Willeen Niece, MD .   Documentation: I  have reviewed the above documentation for accuracy and completeness, and I agree with the above.  Willeen Niece, MD

## 2022-11-12 NOTE — Patient Instructions (Addendum)

## 2022-11-13 ENCOUNTER — Telehealth: Payer: Self-pay

## 2022-11-13 NOTE — Telephone Encounter (Signed)
Called pt to get update post surgery, patient report is doing well, no problems post surgery

## 2022-11-17 ENCOUNTER — Telehealth: Payer: Self-pay

## 2022-11-17 NOTE — Telephone Encounter (Signed)
-----   Message from Willeen Niece, MD sent at 11/17/2022 12:51 PM EDT ----- Skin (M), left perioral cheek EXCISION, EPIDERMOID CYST, MARGINS FREE  Benign cyst

## 2022-11-17 NOTE — Telephone Encounter (Signed)
Advised patient excision of the left perioral cheek was cyst. Suture removal scheduled for 11/19/22 at 2:00 PM with nurse.

## 2022-11-19 ENCOUNTER — Ambulatory Visit (INDEPENDENT_AMBULATORY_CARE_PROVIDER_SITE_OTHER): Payer: Medicare Other

## 2022-11-19 DIAGNOSIS — L720 Epidermal cyst: Secondary | ICD-10-CM

## 2022-11-19 NOTE — Progress Notes (Signed)
Encounter for Removal of Sutures - Incision site at the left cheek is clean, dry and intact - Wound cleansed, sutures removed, wound cleansed and steri strips applied.  - Discussed pathology results showing benign cyst.  - Patient advised to keep steri-strips dry until they fall off. - Scars remodel for a full year. - Once steri-strips fall off, patient can apply over-the-counter silicone scar cream each night to help with scar remodeling if desired. - Patient advised to call with any concerns or if they notice any new or changing lesions.  Dorathy Daft, RMA

## 2022-11-24 ENCOUNTER — Ambulatory Visit: Payer: Medicare Other | Admitting: Family Medicine

## 2022-11-26 ENCOUNTER — Encounter: Payer: Self-pay | Admitting: Family Medicine

## 2022-11-26 ENCOUNTER — Ambulatory Visit (INDEPENDENT_AMBULATORY_CARE_PROVIDER_SITE_OTHER): Payer: Medicare Other | Admitting: Family Medicine

## 2022-11-26 VITALS — BP 120/74 | HR 79 | Temp 98.3°F | Resp 16 | Ht 63.0 in | Wt 150.0 lb

## 2022-11-26 DIAGNOSIS — J432 Centrilobular emphysema: Secondary | ICD-10-CM

## 2022-11-26 DIAGNOSIS — R7303 Prediabetes: Secondary | ICD-10-CM

## 2022-11-26 DIAGNOSIS — E039 Hypothyroidism, unspecified: Secondary | ICD-10-CM | POA: Diagnosis not present

## 2022-11-26 DIAGNOSIS — Z1231 Encounter for screening mammogram for malignant neoplasm of breast: Secondary | ICD-10-CM

## 2022-11-26 DIAGNOSIS — I700 Atherosclerosis of aorta: Secondary | ICD-10-CM

## 2022-11-26 DIAGNOSIS — G629 Polyneuropathy, unspecified: Secondary | ICD-10-CM | POA: Diagnosis not present

## 2022-11-26 DIAGNOSIS — I10 Essential (primary) hypertension: Secondary | ICD-10-CM | POA: Diagnosis not present

## 2022-11-26 NOTE — Progress Notes (Signed)
SUBJECTIVE:   Chief Complaint  Patient presents with   Medical Management of Chronic Issues   HPI Patient presents to clinic for chronic disease management  No acute concerns. No annual labs drawn prior to appointment  Hypothyroid Doing well on Synthroid 100 mcg daily and tolerating well.  Follows with endocrinology, Dr. Debera Lat  Polyneuropathy No acute concerns today.  Neurontin increased to 500 mg 3 times a day.  Self discontinued as did not find any improvement in symptoms.    Pancreatic neoplasm No concerns today.  Takes pancrelipase 3 times daily.  Reports plan for  CT chest/abd/pelvis scheduled for July 8 with oncology follow up   Hypertension Compliant with hydrochlorothiazide 12.5 mg daily and potassium chloride 10 mEq daily.  Tolerating well.  Denies any chest pain, shortness of breath or lower extremity edema.  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  Constitutional:      General: She is not in acute distress.    Appearance: She is not ill-appearing.  HENT:     Head: Normocephalic.     Nose: Nose normal.  Eyes:     Conjunctiva/sclera: Conjunctivae normal.  Cardiovascular:     Rate and Rhythm: Normal rate and regular rhythm.     Heart sounds: Normal heart sounds.  Pulmonary:     Effort: Pulmonary effort is normal.     Breath sounds: Normal breath sounds.  Abdominal:     General: Abdomen is flat. Bowel sounds are normal.     Palpations: Abdomen is soft.  Musculoskeletal:        General: Normal range of motion.     Cervical back: Normal range of motion.  Skin:    General: Skin is warm and dry.  Neurological:     Mental Status: She is alert and oriented to person, place, and time.  Mental status is at baseline.  Psychiatric:        Mood and Affect: Mood normal.        Behavior: Behavior normal.        Thought Content: Thought content normal.        Judgment: Judgment normal.     ASSESSMENT/PLAN:  Breast cancer screening by mammogram -     3D Screening Mammogram, Left and Right; Future  Polyneuropathy Assessment & Plan: Chronic.  S/p chemotherapy Self discontinued Neurontin Does not want to restart Continue to monitor   Centrilobular emphysema (HCC) Assessment & Plan: Chronic. Asymptomatic. Centrilobular emphysema noted on CT chest 07/02/2018.   Pneumonia vaccination completed RSV vaccination completed    Atherosclerosis of aorta [facility]) Assessment & Plan: Chronic.  Stable.  Noted on CT of chest 07/02/2018. Not currently on statin therapy.  Recent LDL at goal less than 100 Check fasting lipids   Acquired hypothyroidism Assessment & Plan: Chronic.  Stable. Recent TSH 1.11 Continue Synthroid 100 mcg daily Follows with Dr. Debera Lat at endocrinology.   Essential hypertension Assessment & Plan: Chronic. Well controlled on current medication Continue Hydrochlorothiazide 12.5 mg daily Continue Potassium 10 mEg daily for diuretic induced hypokalemia. Recent Cmet normal   Prediabetes Assessment & Plan: Check A1c    HCM Pneumonia  vaccination completed Received  RSV vaccination 05/22/22 Received Shingles 03/20/22 and 08/22/22 DEXA completed Hepatitis C screening completed Mammogram up to date. Due 03/25.   PDMP reviewed  Return in about 6 months (around 05/28/2023) for PCP.  Dana Allan, MD

## 2022-11-26 NOTE — Patient Instructions (Signed)
It was a pleasure meeting you today. Thank you for allowing me to take part in your health care.  Our goals for today as we discussed include:  Schedule appointment for blood work.  Fast for 10 hours  Follow up in 6 months   If you have any questions or concerns, please do not hesitate to call the office at [phone].  I look forward to our next visit and until then take care and stay safe.  Regards,   Dana Allan, MD   [facility]

## 2022-12-01 NOTE — Assessment & Plan Note (Signed)
Chronic.  Stable.  Noted on CT of chest 07/02/2018. Not currently on statin therapy.  Recent LDL at goal less than 100 Check fasting lipids

## 2022-12-01 NOTE — Assessment & Plan Note (Signed)
Check A1c. 

## 2022-12-01 NOTE — Assessment & Plan Note (Signed)
Chronic.  S/p chemotherapy Self discontinued Neurontin Does not want to restart Continue to monitor

## 2022-12-01 NOTE — Assessment & Plan Note (Signed)
Chronic.  Stable. Recent TSH 1.11 Continue Synthroid 100 mcg daily Follows with Dr. Debera Lat at endocrinology.

## 2022-12-01 NOTE — Assessment & Plan Note (Signed)
Chronic. Asymptomatic. Centrilobular emphysema noted on CT chest 07/02/2018.   Pneumonia vaccination completed RSV vaccination completed

## 2022-12-01 NOTE — Assessment & Plan Note (Signed)
Chronic. Well controlled on current medication Continue Hydrochlorothiazide 12.5 mg daily Continue Potassium 10 mEg daily for diuretic induced hypokalemia. Recent Cmet normal

## 2022-12-04 ENCOUNTER — Encounter: Payer: Self-pay | Admitting: Family Medicine

## 2022-12-04 DIAGNOSIS — E039 Hypothyroidism, unspecified: Secondary | ICD-10-CM

## 2022-12-15 DIAGNOSIS — C250 Malignant neoplasm of head of pancreas: Secondary | ICD-10-CM | POA: Diagnosis not present

## 2023-01-07 ENCOUNTER — Other Ambulatory Visit: Payer: Self-pay | Admitting: Internal Medicine

## 2023-01-07 DIAGNOSIS — I10 Essential (primary) hypertension: Secondary | ICD-10-CM

## 2023-01-21 ENCOUNTER — Other Ambulatory Visit (INDEPENDENT_AMBULATORY_CARE_PROVIDER_SITE_OTHER): Payer: Medicare Other

## 2023-01-21 DIAGNOSIS — R7303 Prediabetes: Secondary | ICD-10-CM

## 2023-01-21 DIAGNOSIS — Z13220 Encounter for screening for lipoid disorders: Secondary | ICD-10-CM | POA: Diagnosis not present

## 2023-01-21 DIAGNOSIS — R748 Abnormal levels of other serum enzymes: Secondary | ICD-10-CM

## 2023-01-21 DIAGNOSIS — E876 Hypokalemia: Secondary | ICD-10-CM

## 2023-01-21 DIAGNOSIS — E559 Vitamin D deficiency, unspecified: Secondary | ICD-10-CM

## 2023-01-21 DIAGNOSIS — D490 Neoplasm of unspecified behavior of digestive system: Secondary | ICD-10-CM | POA: Diagnosis not present

## 2023-01-21 LAB — CBC WITH DIFFERENTIAL/PLATELET
Basophils Absolute: 0 10*3/uL (ref 0.0–0.1)
Basophils Relative: 0.7 % (ref 0.0–3.0)
Eosinophils Absolute: 0.1 10*3/uL (ref 0.0–0.7)
Eosinophils Relative: 0.9 % (ref 0.0–5.0)
HCT: 35.2 % — ABNORMAL LOW (ref 36.0–46.0)
Hemoglobin: 11.2 g/dL — ABNORMAL LOW (ref 12.0–15.0)
Lymphocytes Relative: 31.9 % (ref 12.0–46.0)
Lymphs Abs: 1.8 10*3/uL (ref 0.7–4.0)
MCHC: 31.9 g/dL (ref 30.0–36.0)
MCV: 88 fl (ref 78.0–100.0)
Monocytes Absolute: 0.3 10*3/uL (ref 0.1–1.0)
Monocytes Relative: 5.1 % (ref 3.0–12.0)
Neutro Abs: 3.4 10*3/uL (ref 1.4–7.7)
Neutrophils Relative %: 61.4 % (ref 43.0–77.0)
Platelets: 178 10*3/uL (ref 150.0–400.0)
RBC: 4 Mil/uL (ref 3.87–5.11)
RDW: 15.4 % (ref 11.5–15.5)
WBC: 5.5 10*3/uL (ref 4.0–10.5)

## 2023-01-21 LAB — LIPID PANEL
Cholesterol: 199 mg/dL (ref 0–200)
HDL: 71.9 mg/dL (ref >39.00)
LDL Cholesterol: 109 mg/dL — ABNORMAL HIGH (ref 0–99)
NonHDL: 127.42
Total CHOL/HDL Ratio: 3
Triglycerides: 93 mg/dL (ref 0.0–149.0)
VLDL: 18.6 mg/dL (ref 0.0–40.0)

## 2023-01-21 LAB — COMPREHENSIVE METABOLIC PANEL
ALT: 10 U/L (ref 0–35)
AST: 15 U/L (ref 0–37)
Albumin: 3.9 g/dL (ref 3.5–5.2)
Alkaline Phosphatase: 46 U/L (ref 39–117)
BUN: 13 mg/dL (ref 6–23)
CO2: 29 mEq/L (ref 19–32)
Calcium: 9.3 mg/dL (ref 8.4–10.5)
Chloride: 101 mEq/L (ref 96–112)
Creatinine, Ser: 0.9 mg/dL (ref 0.40–1.20)
GFR: 61.93 mL/min (ref >60.00)
Glucose, Bld: 118 mg/dL — ABNORMAL HIGH (ref 70–99)
Potassium: 3.9 mEq/L (ref 3.5–5.1)
Sodium: 136 mEq/L (ref 135–145)
Total Bilirubin: 0.4 mg/dL (ref 0.2–1.2)
Total Protein: 6.8 g/dL (ref 6.0–8.3)

## 2023-01-21 LAB — VITAMIN D 25 HYDROXY (VIT D DEFICIENCY, FRACTURES): VITD: 20.22 ng/mL — ABNORMAL LOW (ref 30.00–100.00)

## 2023-01-21 LAB — HEMOGLOBIN A1C: Hgb A1c MFr Bld: 6.3 % (ref 4.6–6.5)

## 2023-01-21 LAB — VITAMIN B12: Vitamin B-12: 532 pg/mL (ref 211–911)

## 2023-02-05 ENCOUNTER — Encounter: Payer: Self-pay | Admitting: Family Medicine

## 2023-02-05 DIAGNOSIS — E559 Vitamin D deficiency, unspecified: Secondary | ICD-10-CM

## 2023-02-06 ENCOUNTER — Other Ambulatory Visit: Payer: Self-pay | Admitting: Family Medicine

## 2023-02-06 MED ORDER — VITAMIN D (ERGOCALCIFEROL) 1.25 MG (50000 UNIT) PO CAPS: 50000.0000 [IU] | ORAL_CAPSULE | ORAL | 1 refills | Status: DC

## 2023-02-06 NOTE — Telephone Encounter (Signed)
Pt would like clarification, Vit D 1.32mcg has been pended if this is what your wanting pt to take til switched to otc

## 2023-02-27 ENCOUNTER — Other Ambulatory Visit: Payer: Self-pay

## 2023-02-27 DIAGNOSIS — J321 Chronic frontal sinusitis: Secondary | ICD-10-CM

## 2023-02-27 MED ORDER — FLUTICASONE PROPIONATE 50 MCG/ACT NA SUSP: 2.0000 | Freq: Every day | NASAL | 3 refills | Status: AC

## 2023-05-28 ENCOUNTER — Ambulatory Visit: Payer: Medicare Other | Admitting: Family Medicine

## 2023-05-28 ENCOUNTER — Encounter: Payer: Self-pay | Admitting: Family Medicine

## 2023-05-28 VITALS — BP 130/80 | HR 64 | Temp 98.2°F | Resp 18 | Ht 63.0 in | Wt 153.4 lb

## 2023-05-28 DIAGNOSIS — E039 Hypothyroidism, unspecified: Secondary | ICD-10-CM

## 2023-05-28 DIAGNOSIS — I700 Atherosclerosis of aorta: Secondary | ICD-10-CM | POA: Diagnosis not present

## 2023-05-28 DIAGNOSIS — E559 Vitamin D deficiency, unspecified: Secondary | ICD-10-CM

## 2023-05-28 DIAGNOSIS — D649 Anemia, unspecified: Secondary | ICD-10-CM

## 2023-05-28 DIAGNOSIS — R7989 Other specified abnormal findings of blood chemistry: Secondary | ICD-10-CM

## 2023-05-28 DIAGNOSIS — I10 Essential (primary) hypertension: Secondary | ICD-10-CM | POA: Diagnosis not present

## 2023-05-28 NOTE — Progress Notes (Signed)
SUBJECTIVE:   Chief Complaint  Patient presents with   Medical Management of Chronic Issues    6 follow up   HPI Presents for follow up chronic disease management  Discussed the use of AI scribe software for clinical note transcription with the patient, who gave verbal consent to proceed.  History of Present Illness The patient, previously under the care of an endocrinologist who has since retired, presents for a six-month follow-up. She reports doing well on her current regimen of levothyroxine 100 mcg for her thyroid condition. She has chosen to continue her thyroid care with her current provider rather than seeking a new endocrinologist.  The patient's most recent blood work in April showed a slightly low hemoglobin level, but it had improved from previous results. She reports feeling well and denies any bleeding. She has a good appetite and denies any gastrointestinal issues.  The patient's blood pressure was slightly elevated during the visit, which she attributes to recent dietary indulgence in crab legs, high in sodium, over the past week. She reports that her blood pressure is typically around 120 at home. She denies any chest pain, shortness of breath, or leg swelling.  The patient is also on a vitamin D supplement, which she has been taking regularly. She had previously been on a high-dose regimen for six months and has since switched to an over-the-counter supplement. She will continue this regimen until her next visit.  The patient remains active as tolerated and denies any new symptoms or health concerns. She does not require any medication refills at this time.    PERTINENT PMH / PSH: As above  OBJECTIVE:  BP 130/80   Pulse 64   Temp 98.2 F (36.8 C)   Resp 18   Ht 5\' 3"  (1.6 m)   Wt 153 lb 6 oz (69.6 kg)   SpO2 100%   BMI 27.17 kg/m    Physical Exam Vitals reviewed.  Constitutional:      General: She is not in acute distress.    Appearance: Normal  appearance. She is not ill-appearing, toxic-appearing or diaphoretic.  Eyes:     General:        Right eye: No discharge.        Left eye: No discharge.     Conjunctiva/sclera: Conjunctivae normal.  Cardiovascular:     Rate and Rhythm: Normal rate and regular rhythm.     Heart sounds: Normal heart sounds.  Pulmonary:     Effort: Pulmonary effort is normal.     Breath sounds: Normal breath sounds.  Abdominal:     General: Bowel sounds are normal.  Musculoskeletal:        General: Normal range of motion.  Skin:    General: Skin is warm and dry.  Neurological:     General: No focal deficit present.     Mental Status: She is alert and oriented to person, place, and time. Mental status is at baseline.  Psychiatric:        Mood and Affect: Mood normal.        Behavior: Behavior normal.        Thought Content: Thought content normal.        Judgment: Judgment normal.        05/28/2023    8:40 AM 09/18/2022    3:36 PM 09/10/2022    1:09 PM 05/21/2022   10:15 AM 11/15/2021    8:09 AM  Depression screen PHQ 2/9  Decreased Interest 0 0 0  0 0  Down, Depressed, Hopeless 0 0 0 0 0  PHQ - 2 Score 0 0 0 0 0  Altered sleeping 0   0   Tired, decreased energy 0   0   Change in appetite 0   0   Feeling bad or failure about yourself  0   0   Trouble concentrating 0   0   Moving slowly or fidgety/restless 0   0   Suicidal thoughts 0   0   PHQ-9 Score 0   0   Difficult doing work/chores Not difficult at all   Not difficult at all       05/28/2023    8:40 AM 05/21/2022   10:15 AM 09/21/2019    9:13 AM  GAD 7 : Generalized Anxiety Score  Nervous, Anxious, on Edge 0 0 0  Control/stop worrying 0 0 0  Worry too much - different things 0 0 0  Trouble relaxing 0 0 0  Restless 0 0 0  Easily annoyed or irritable 0 0 0  Afraid - awful might happen 0 0 0  Total GAD 7 Score 0 0 0  Anxiety Difficulty Not difficult at all Not difficult at all Not difficult at all    ASSESSMENT/PLAN:   Essential hypertension Assessment & Plan: Elevated blood pressure at visit, likely secondary to recent high sodium intake (crab legs). Patient reports usual home readings around 120. No symptoms of hypertensive urgency/emergency. -Advise patient to limit sodium intake. -Recheck blood pressure at next visit. -Continue hydrochlorothiazide 12.5 mg daily   Orders: -     CBC with Differential/Platelet; Future -     Comprehensive metabolic panel; Future  Atherosclerosis of aorta (HCC) -     Lipid panel; Future  Acquired hypothyroidism Assessment & Plan: Stable on Levothyroxine daily. Previously managed by an endocrinologist who has since retired. Patient prefers to continue management with current provider. -Continue Levothyroxine daily.  Orders: -     TSH; Future  Vitamin D deficiency, unspecified Assessment & Plan: Patient has been on high-dose Vitamin D supplementation and is transitioning to over-the-counter supplementation. -Continue over-the-counter Vitamin D supplementation. -Recheck Vitamin D levels at next visit.  Orders: -     VITAMIN D 25 Hydroxy (Vit-D Deficiency, Fractures); Future  Elevated vitamin B12 level -     Vitamin B12; Future  Normocytic anemia Assessment & Plan: Hemoglobin levels slightly low but have improved since last check. No symptoms of anemia reported. No known sources of bleeding. -Monitor hemoglobin levels at next visit.    PDMP reviewed  Return in about 6 months (around 11/26/2023) for PCP, HTN, Thyroid.  Dana Allan, MD

## 2023-05-28 NOTE — Patient Instructions (Addendum)
It was a pleasure meeting you today. Thank you for allowing me to take part in your health care.  Our goals for today as we discussed include:  Blood pressure good Limit salt intake Continue current medications   Schedule lab appointment 1 week prior to next visit.  Fast for 10 hours  Follow up in 6 months   This is a list of the screening recommended for you and due dates:  Health Maintenance  Topic Date Due   COVID-19 Vaccine (7 - 2024-25 season) 02/08/2023   Mammogram  08/08/2023   Medicare Annual Wellness Visit  09/18/2023   Pneumonia Vaccine  Completed   DEXA scan (bone density measurement)  Completed   Hepatitis C Screening  Completed   Zoster (Shingles) Vaccine  Completed   HPV Vaccine  Aged Out   DTaP/Tdap/Td vaccine  Discontinued   Flu Shot  Discontinued   Colon Cancer Screening  Discontinued      If you have any questions or concerns, please do not hesitate to call the office at [phone].  I look forward to our next visit and until then take care and stay safe.  Regards,   Dana Allan, MD   [facility]

## 2023-06-03 ENCOUNTER — Encounter: Payer: Self-pay | Admitting: Family Medicine

## 2023-06-03 NOTE — Assessment & Plan Note (Signed)
Patient has been on high-dose Vitamin D supplementation and is transitioning to over-the-counter supplementation. -Continue over-the-counter Vitamin D supplementation. -Recheck Vitamin D levels at next visit.

## 2023-06-03 NOTE — Assessment & Plan Note (Signed)
Elevated blood pressure at visit, likely secondary to recent high sodium intake (crab legs). Patient reports usual home readings around 120. No symptoms of hypertensive urgency/emergency. -Advise patient to limit sodium intake. -Recheck blood pressure at next visit. -Continue hydrochlorothiazide 12.5 mg daily

## 2023-06-03 NOTE — Assessment & Plan Note (Signed)
Stable on Levothyroxine daily. Previously managed by an endocrinologist who has since retired. Patient prefers to continue management with current provider. -Continue Levothyroxine daily.

## 2023-06-03 NOTE — Assessment & Plan Note (Signed)
Hemoglobin levels slightly low but have improved since last check. No symptoms of anemia reported. No known sources of bleeding. -Monitor hemoglobin levels at next visit.

## 2023-06-22 DIAGNOSIS — Z79899 Other long term (current) drug therapy: Secondary | ICD-10-CM | POA: Diagnosis not present

## 2023-06-22 DIAGNOSIS — C250 Malignant neoplasm of head of pancreas: Secondary | ICD-10-CM | POA: Diagnosis not present

## 2023-06-22 DIAGNOSIS — Z90411 Acquired partial absence of pancreas: Secondary | ICD-10-CM | POA: Diagnosis not present

## 2023-07-01 ENCOUNTER — Ambulatory Visit: Payer: Medicare Other | Admitting: Oncology

## 2023-07-01 ENCOUNTER — Other Ambulatory Visit: Payer: Medicare Other

## 2023-07-10 ENCOUNTER — Other Ambulatory Visit: Payer: Self-pay

## 2023-07-10 DIAGNOSIS — Z8507 Personal history of malignant neoplasm of pancreas: Secondary | ICD-10-CM

## 2023-07-13 ENCOUNTER — Inpatient Hospital Stay: Payer: Medicare Other | Admitting: Oncology

## 2023-07-13 ENCOUNTER — Inpatient Hospital Stay: Payer: Medicare Other | Attending: Oncology

## 2023-07-13 ENCOUNTER — Encounter: Payer: Self-pay | Admitting: Oncology

## 2023-07-13 VITALS — BP 139/59 | HR 63 | Temp 96.3°F | Resp 18 | Wt 148.2 lb

## 2023-07-13 DIAGNOSIS — Z87891 Personal history of nicotine dependence: Secondary | ICD-10-CM | POA: Diagnosis not present

## 2023-07-13 DIAGNOSIS — Z8507 Personal history of malignant neoplasm of pancreas: Secondary | ICD-10-CM | POA: Diagnosis not present

## 2023-07-13 DIAGNOSIS — D649 Anemia, unspecified: Secondary | ICD-10-CM | POA: Diagnosis not present

## 2023-07-13 DIAGNOSIS — Z95828 Presence of other vascular implants and grafts: Secondary | ICD-10-CM

## 2023-07-13 DIAGNOSIS — E876 Hypokalemia: Secondary | ICD-10-CM | POA: Insufficient documentation

## 2023-07-13 DIAGNOSIS — R7989 Other specified abnormal findings of blood chemistry: Secondary | ICD-10-CM | POA: Insufficient documentation

## 2023-07-13 DIAGNOSIS — Z9221 Personal history of antineoplastic chemotherapy: Secondary | ICD-10-CM | POA: Insufficient documentation

## 2023-07-13 DIAGNOSIS — Z9049 Acquired absence of other specified parts of digestive tract: Secondary | ICD-10-CM | POA: Insufficient documentation

## 2023-07-13 LAB — CMP (CANCER CENTER ONLY)
ALT: 16 U/L (ref 0–44)
AST: 22 U/L (ref 15–41)
Albumin: 3.8 g/dL (ref 3.5–5.0)
Alkaline Phosphatase: 46 U/L (ref 38–126)
Anion gap: 2 — ABNORMAL LOW (ref 5–15)
BUN: 15 mg/dL (ref 8–23)
CO2: 22 mmol/L (ref 22–32)
Calcium: 8.9 mg/dL (ref 8.9–10.3)
Chloride: 106 mmol/L (ref 98–111)
Creatinine: 0.79 mg/dL (ref 0.44–1.00)
GFR, Estimated: >60 mL/min (ref >60)
Glucose, Bld: 101 mg/dL — ABNORMAL HIGH (ref 70–99)
Potassium: 3.4 mmol/L — ABNORMAL LOW (ref 3.5–5.1)
Sodium: 130 mmol/L — ABNORMAL LOW (ref 135–145)
Total Bilirubin: 0.6 mg/dL (ref 0.0–1.2)
Total Protein: 7.3 g/dL (ref 6.5–8.1)

## 2023-07-13 LAB — CBC WITH DIFFERENTIAL (CANCER CENTER ONLY)
Abs Immature Granulocytes: 0.01 10*3/uL (ref 0.00–0.07)
Basophils Absolute: 0 10*3/uL (ref 0.0–0.1)
Basophils Relative: 1 %
Eosinophils Absolute: 0.1 10*3/uL (ref 0.0–0.5)
Eosinophils Relative: 1 %
HCT: 32.2 % — ABNORMAL LOW (ref 36.0–46.0)
Hemoglobin: 10.6 g/dL — ABNORMAL LOW (ref 12.0–15.0)
Immature Granulocytes: 0 %
Lymphocytes Relative: 33 %
Lymphs Abs: 2.2 10*3/uL (ref 0.7–4.0)
MCH: 27.8 pg (ref 26.0–34.0)
MCHC: 32.9 g/dL (ref 30.0–36.0)
MCV: 84.5 fL (ref 80.0–100.0)
Monocytes Absolute: 0.3 10*3/uL (ref 0.1–1.0)
Monocytes Relative: 5 %
Neutro Abs: 3.9 10*3/uL (ref 1.7–7.7)
Neutrophils Relative %: 60 %
Platelet Count: 194 10*3/uL (ref 150–400)
RBC: 3.81 MIL/uL — ABNORMAL LOW (ref 3.87–5.11)
RDW: 15.1 % (ref 11.5–15.5)
WBC Count: 6.5 10*3/uL (ref 4.0–10.5)
nRBC: 0 % (ref 0.0–0.2)

## 2023-07-13 MED ORDER — HEPARIN SOD (PORK) LOCK FLUSH 100 UNIT/ML IV SOLN
500.0000 [IU] | Freq: Once | INTRAVENOUS | Status: AC
  Administered 2023-07-13: 500 [IU] via INTRAVENOUS
  Filled 2023-07-13: qty 5

## 2023-07-13 MED ORDER — SODIUM CHLORIDE 0.9% FLUSH
10.0000 mL | Freq: Once | INTRAVENOUS | Status: AC
  Administered 2023-07-13: 10 mL via INTRAVENOUS
  Filled 2023-07-13: qty 10

## 2023-07-13 NOTE — Assessment & Plan Note (Signed)
K level has improved after she took potassium supplementation.

## 2023-07-13 NOTE — Progress Notes (Signed)
Hematology/Oncology Follow Up Note [phone] [phone]  REASON FOR VISIT  follow-up for pancreatic cancer.  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 Status post 7 out of the 12 planned cycles of mFOLFIRINOX with difficulties and have decided not to proceed with additional chemo.No evidence of metastatic disease on recent CT scan done at [facility]. CA19.9  is stable.  Continue to follow-up for surveillance.  She is 5 years post surgery.[Dec 2019]  CT scan at [facility] shows no recurrence.  She will see [facility] in 6 months with plan of repeat CT She will return to see me in 1 year.  Normocytic anemia Likely due to previous chemotherapy. Stable hemoglobin. Continue monitor   Port-A-Cath in place Discussed with patient about the option of port removal.  Patient prefers to keep her medi port.   Hypokalemia K level has improved after she took potassium supplementation.   No orders of the defined types were placed in this encounter.  Follow up in 1 year All questions were answered. The patient knows to call the clinic with any problems, questions or concerns.  Lynn Patience, MD, PhD [facility] Hematology Oncology 07/13/2023    HISTORY OF PRESENTING ILLNESS:  Lynn Taylor is a  78 y.o.  female with PMH listed below who was referred to me for evaluation of evaluation of elevated ferritin. Patient recently had lab work-up done on 04/09/2018 which showed ferritin level 1877, iron 97, TIBC 340, iron saturation 29, 04/11/2018 CBC showed hemoglobin 13.2, MCV 81, WBC 7.6, platelet counts 246,000. Patient was referred to hematology for further evaluation of elevated ferritin.   #04/28/2018 Ultrasound of liver was obtained which showed CBD and intrahepatic biliary dilatation Patient wa she is s advised to go to emergency room for further evaluation. Also had hyperbilirubinemia.  Patient was  complaining about being jaundiced, pruritus all over. MR abdomen MRCP with and without contrast showed market biliary duct dilatation, secondary to obstruction in the region of pancreatic head.  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 No abdominal adenopathy, liver metastasis or cross vascular involvement.  patient was evaluated by gastroenterology and status post ERCP with stenting. CA 19.9 1173 CEA 3.9 # 05/25/2018  patient was referred to [facility] and s/p whipple resection.Her postop course was c/b Type A pancreatic leak and c.diff. Pathology showed A. Hepatic artery lymph node, excision: One lymph node, negative for malignancy (0/1). B. Biliary stent, removal: Medical device, consistent with stent, gross examination only. C. 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 All margins are negative (closest margin=uncinate, 0.5 mm) Metastatic adenocarcinoma in one of thirty-two lymph nodes (1/32).  Portion of stomach and duodenum with no specific pathologic diagnosis.   Grade, 3 poorly differentiated.  Pathologic pT2 pN1   Cancer Treatment 07/13/2018 Cycle 1 FOLFIRINOX with Onpro Neulasta.  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 10/06/2018 cycle 6  mFOLFIRINOX with Onpro Neulasta - [50%  dose for Irinotecan- 5-FU bolus  omitted]. 10/20/2018 cycle 7 mFOLFIRINOX with Onpro Neulasta - [50% dose for Irinotecan- 5-FU bolus omitted].  # 04/02/2020 [facility] CT chest abdomen pelvis  Postsurgical changes from Whipple procedure. Minimally increased dilation of the main pancreatic duct, which is nonspecific. Overall stable appearance of the pancreaticojejunostomy. Recommend attention on follow-up.  Decreased, previously prominent right axillary lymph node. No definite findings of new or increased metastatic disease in the chest, abdomen, or pelvis.  05/20/2021, [facility] CT chest abdomen pelvis with contrast showed no evidence of recurrent or metastatic disease in the chest abdomen pelvis. 11/18/2021 CT chest abdomen pelvis w contrast showed no evidence of recurrent or metastatic disease in the chest, abdomen, or  pelvis.   INTERVAL HISTORY Lynn Taylor is a 78 y.o. female with history of stage IIb pancreatic cancer presents to for follow-up.   Patient was recently seen at [facility] and had a CT scan done there. 06/22/2023 CT chest abdomen pelvis w contrast showed No findings of locally recurrent or metastatic disease within the chest, abdomen or pelvis  Denies any unintentional weight loss, fever, chills, diarrhea, abdominal pain.  Her weight is stable.  She has no new complaints   Review of Systems  Constitutional:  Negative for appetite change, chills, fatigue, fever and unexpected weight change.  HENT:   Negative for hearing loss and voice change.   Eyes:  Negative for eye problems.  Respiratory:  Negative for chest tightness, cough and shortness of breath.   Cardiovascular:  Negative for chest pain and leg swelling.  Gastrointestinal:  Negative for abdominal distention, abdominal pain, blood in stool and diarrhea.  Endocrine: Negative for hot flashes.  Genitourinary:  Negative for difficulty urinating and frequency.   Musculoskeletal:  Negative for arthralgias.  Skin:  Negative for itching and rash.  Neurological:  Positive  for numbness. Negative for extremity weakness.  Hematological:  Negative for adenopathy.  Psychiatric/Behavioral:  Negative for confusion.       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   Herniated disc, cervical 04/29/2019   History of deep vein thrombosis (DVT) of lower extremity 12/26/2021   Hyperbilirubinemia 04/28/2018   Hypertension    Hypomagnesemia 11/25/2018   Hypomagnesemia 11/25/2018   Hypothyroidism    Jaundice    Lumbar herniated disc 04/29/2019   Malignant neoplasm of head of pancreas (HCC) 06/08/2018   Malignant neoplasm of head of pancreas (HCC) 06/08/2018   Malignant neoplasm of head of pancreas (HCC) 06/08/2018   Personal history of chemotherapy 2019    Pancreatic cancer  Wears dentures    full upper and lower     Past Surgical History:  Procedure Laterality Date   CATARACT EXTRACTION W/PHACO Right 11/27/2021   Procedure: CATARACT EXTRACTION PHACO AND INTRAOCULAR LENS PLACEMENT  (IOC) RIGHT VISION BLUE, OMIDRIA  7.91 00:48.1;  Surgeon: Estanislado Pandy, MD;  Location: [facility];  Service: Ophthalmology;  Laterality: Right;   CATARACT EXTRACTION W/PHACO Left 12/18/2021   Procedure: CATARACT EXTRACTION PHACO AND INTRAOCULAR LENS PLACEMENT (IOC) LEFT;  Surgeon: Estanislado Pandy, MD;  Location: [facility];  Service: Ophthalmology;  Laterality: Left;  7.09 0:56.0   CHOLECYSTECTOMY     COLONOSCOPY N/A 12/02/2016   Procedure: COLONOSCOPY;  Surgeon: Christena Deem, MD;  Location: [facility] ENDOSCOPY;  Service: Endoscopy;  Laterality: N/A;   ERCP N/A 04/30/2018   Procedure: ENDOSCOPIC RETROGRADE CHOLANGIOPANCREATOGRAPHY (ERCP);  Surgeon: Midge Minium, MD;  Location: [facility] ENDOSCOPY;  Service: Endoscopy;  Laterality: N/A;   ERCP N/A 05/05/2018   Procedure: ENDOSCOPIC RETROGRADE CHOLANGIOPANCREATOGRAPHY (ERCP);  Surgeon: Midge Minium, MD;  Location: [facility] ENDOSCOPY;  Service: Endoscopy;  Laterality: N/A;   PORTA CATH INSERTION N/A 06/28/2018   Procedure: PORTA CATH INSERTION;  Surgeon: Annice Needy, MD;  Location: [facility] INVASIVE CV LAB;  Service: Cardiovascular;  Laterality: N/A;    Social History   Socioeconomic History   Marital status: Married    Spouse name: Not on file   Number of children: Not on file   Years of education: Not on file   Highest education level: Not on file  Occupational History   Not on file  Tobacco Use   Smoking status: Former    Current packs/day: 0.00    Types: Cigarettes    Quit date: 04/14/1988    Years since quitting: 35.2   Smokeless tobacco: Never  Vaping Use   Vaping status: Never Used  Substance and Sexual Activity   Alcohol use: Yes    Comment: social drinker   Drug use: No   Sexual activity: Not Currently  Other Topics Concern   Not on file  Social History Narrative   Lives at home married    In order of [place] star   Social Drivers of Health   Financial Resource Strain: Low Risk  (09/18/2022)    Overall Financial Resource Strain (CARDIA)    Difficulty of Paying Living Expenses: Not hard at all  Food Insecurity: No Food Insecurity (09/18/2022)   Hunger Vital Sign    Worried About Running Out of Food in the Last Year: Never true    Ran Out of Food in the Last Year: Never true  Transportation Needs: No Transportation Needs (09/18/2022)   PRAPARE - Administrator, Civil Service (Medical): No    Lack of Transportation (Non-Medical): No  Physical Activity: Inactive (09/18/2022)   Exercise Vital Sign    Days of Exercise per Week: 0 days    Minutes of Exercise per Session: 0 min  Stress: No Stress Concern Present (09/18/2022)   [employer] of Occupational Health - Occupational Stress Questionnaire    Feeling of Stress : Not at all  Social Connections: Unknown (09/18/2022)   Social Connection and Isolation Panel [NHANES]    Frequency of Communication with Friends and Family: Not on file    Frequency of Social Gatherings with Friends and Family: Not on file    Attends Religious Services: Not on file    Active Member of Clubs or Organizations: Not on file    Attends Banker  Meetings: Not on file    Marital Status: Married  Intimate Partner Violence: Not At Risk (09/18/2022)   Humiliation, Afraid, Rape, and Kick questionnaire    Fear of Current or Ex-Partner: No    Emotionally Abused: No    Physically Abused: No    Sexually Abused: No    Family History  Adopted: Yes  Problem Relation Age of Onset   Diabetes Mellitus I Other    Alcoholism Other    Hypertension Other    Hyperlipidemia Other    Coronary artery disease Other    Stroke Other    Osteoarthritis Other    Migraines Other    Heart Problems Mother    Stroke Sister    CAD Brother    Stroke Brother    Breast cancer Neg Hx      Current Outpatient Medications:    ascorbic acid (VITAMIN C) 500 MG tablet, , Disp: , Rfl:    aspirin (ASPIRIN 81) 81 MG EC tablet, Take 2 tablets (162 mg total) by  mouth daily. Swallow whole., Disp: 180 tablet, Rfl: 1   CREON 36000-114000 units CPEP capsule, Take 36,000 Units by mouth 3 (three) times daily., Disp: , Rfl:    fluticasone (FLONASE) 50 MCG/ACT nasal spray, Place 2 sprays into both nostrils daily. After nasal saline, Disp: 48 g, Rfl: 3   hydrochlorothiazide (HYDRODIURIL) 12.5 MG tablet, TAKE 1 TABLET BY MOUTH DAILY IN  THE MORNING, Disp: 100 tablet, Rfl: 2   KLOR-CON 10 10 MEQ tablet, Take by mouth., Disp: , Rfl:    levothyroxine (SYNTHROID) 100 MCG tablet, Take 1 tablet (100 mcg total) by mouth daily before breakfast., Disp: 90 tablet, Rfl: 3   Magnesium Cl-Calcium Carbonate (SLOW-MAG PO), Take by mouth. 2 QD, Disp: , Rfl:    Multiple Vitamins-Minerals (CENTRUM SILVER 50+WOMEN PO), Take by mouth., Disp: , Rfl:    TURMERIC CURCUMIN PO, Take by mouth 3 (three) times daily with meals., Disp: , Rfl:    Vitamin D, Ergocalciferol, (DRISDOL) 1.25 MG (50000 UNIT) CAPS capsule, Take 1 capsule (50,000 Units total) by mouth every 7 (seven) days., Disp: 12 capsule, Rfl: 1  Physical exam:  Vitals:   07/13/23 1129  BP: (!) 139/59  Pulse: 63  Resp: 18  Temp: (!) 96.3 F (35.7 C)  TempSrc: Tympanic  SpO2: 100%  Weight: 148 lb 3.2 oz (67.2 kg)   Physical Exam Constitutional:      General: She is not in acute distress. HENT:     Head: Normocephalic and atraumatic.  Eyes:     General: No scleral icterus. Cardiovascular:     Rate and Rhythm: Normal rate.  Pulmonary:     Effort: Pulmonary effort is normal. No respiratory distress.  Abdominal:     General: There is no distension.  Musculoskeletal:        General: No deformity. Normal range of motion.     Cervical back: Normal range of motion and neck supple.  Skin:    Findings: No rash.  Neurological:     Mental Status: She is alert and oriented to person, place, and time. Mental status is at baseline.  Psychiatric:        Mood and Affect: Mood normal.    Lab results.     Latest Ref Rng  & Units 07/13/2023   11:20 AM 01/21/2023    9:32 AM 06/30/2022   10:13 AM  CBC  WBC 4.0 - 10.5 K/uL 6.5  5.5  5.3   Hemoglobin 12.0 -  15.0 g/dL 16.1  09.6  04.5   Hematocrit 36.0 - 46.0 % 32.2  35.2  31.4   Platelets 150 - 400 K/uL 194  178.0  181       Latest Ref Rng & Units 07/13/2023   11:20 AM 01/21/2023    9:32 AM 09/10/2022    1:22 PM  CMP  Glucose 70 - 99 mg/dL 409  811  80   BUN 8 - 23 mg/dL 15  13  12    Creatinine 0.44 - 1.00 mg/dL 9.14  7.82  9.56   Sodium 135 - 145 mmol/L 130  136  138   Potassium 3.5 - 5.1 mmol/L 3.4  3.9  4.1   Chloride 98 - 111 mmol/L 106  101  108   CO2 22 - 32 mmol/L 22  29  25    Calcium 8.9 - 10.3 mg/dL 8.9  9.3  9.8   Total Protein 6.5 - 8.1 g/dL 7.3  6.8  7.4   Total Bilirubin 0.0 - 1.2 mg/dL 0.6  0.4  0.3   Alkaline Phos 38 - 126 U/L 46  46  56   AST 15 - 41 U/L 22  15  19    ALT 0 - 44 U/L 16  10  15       RADIOGRAPHIC STUDIES: I have personally reviewed the radiological images as listed and agreed with the findings in the report. [facility] CT report was reviewed.

## 2023-07-13 NOTE — Assessment & Plan Note (Addendum)
#  Stage IIB pancreatic cancer status post Whipple resection [05/25/18]. Status post 7 out of the 12 planned cycles of mFOLFIRINOX with difficulties and have decided not to proceed with additional chemo.No evidence of metastatic disease on recent CT scan done at [facility]. CA19.9  is stable.  Continue to follow-up for surveillance.  She is 5 years post surgery.[Dec 2019]  CT scan at [facility] shows no recurrence.  She will see [facility] in 6 months with plan of repeat CT She will return to see me in 1 year.

## 2023-07-13 NOTE — Assessment & Plan Note (Signed)
Discussed with patient about the option of port removal.  Patient prefers to keep her medi port.

## 2023-07-13 NOTE — Assessment & Plan Note (Signed)
Likely due to previous chemotherapy. Stable hemoglobin. Continue monitor

## 2023-07-15 DIAGNOSIS — H40003 Preglaucoma, unspecified, bilateral: Secondary | ICD-10-CM | POA: Diagnosis not present

## 2023-07-15 DIAGNOSIS — H524 Presbyopia: Secondary | ICD-10-CM | POA: Diagnosis not present

## 2023-07-15 DIAGNOSIS — Z961 Presence of intraocular lens: Secondary | ICD-10-CM | POA: Diagnosis not present

## 2023-07-20 ENCOUNTER — Encounter: Payer: Self-pay | Admitting: Family Medicine

## 2023-07-22 ENCOUNTER — Ambulatory Visit (INDEPENDENT_AMBULATORY_CARE_PROVIDER_SITE_OTHER): Payer: Medicare Other | Admitting: Family Medicine

## 2023-07-22 ENCOUNTER — Encounter: Payer: Self-pay | Admitting: Family Medicine

## 2023-07-22 VITALS — BP 124/68 | HR 72 | Temp 98.2°F | Resp 18 | Ht 63.0 in | Wt 146.2 lb

## 2023-07-22 DIAGNOSIS — E871 Hypo-osmolality and hyponatremia: Secondary | ICD-10-CM

## 2023-07-22 DIAGNOSIS — E559 Vitamin D deficiency, unspecified: Secondary | ICD-10-CM

## 2023-07-22 DIAGNOSIS — B001 Herpesviral vesicular dermatitis: Secondary | ICD-10-CM | POA: Diagnosis not present

## 2023-07-22 DIAGNOSIS — E876 Hypokalemia: Secondary | ICD-10-CM

## 2023-07-22 DIAGNOSIS — I10 Essential (primary) hypertension: Secondary | ICD-10-CM

## 2023-07-22 MED ORDER — LOSARTAN POTASSIUM 25 MG PO TABS: 25.0000 mg | ORAL_TABLET | Freq: Every day | ORAL | 0 refills | Status: DC

## 2023-07-22 MED ORDER — VALACYCLOVIR HCL 1 G PO TABS: 2000.0000 mg | ORAL_TABLET | Freq: Two times a day (BID) | ORAL | 0 refills | Status: DC

## 2023-07-22 NOTE — Telephone Encounter (Signed)
Pt coming in office at 2 pm 07/22/2023.

## 2023-07-22 NOTE — Patient Instructions (Addendum)
It was a pleasure meeting you today. Thank you for allowing me to take part in your health care.  Our goals for today as we discussed include:  Take Valtrex 2 tablets 2 times a day for 24 hours.   If having recurrent symptoms can continue above as soon as symptoms occur.   Stop Hydrochlorothiazide  Start Losartan 25 mg daily.  Continue to monitor blood pressure.  Goal <150/90  We will get some labs today.  If they are abnormal or we need to do something about them, I will call you.  If they are normal, I will send you a message on MyChart (if it is active) or a letter in the mail.  If you don't hear from [place] in 2 weeks, please call the office at the number below.   Follow up in 2 weeks    This is a list of the screening recommended for you and due dates:  Health Maintenance  Topic Date Due   COVID-19 Vaccine (7 - 2024-25 season) 02/08/2023   Medicare Annual Wellness Visit  09/18/2023   Flu Shot  09/07/2023*   Mammogram  08/08/2023   Pneumonia Vaccine  Completed   DEXA scan (bone density measurement)  Completed   Hepatitis C Screening  Completed   Zoster (Shingles) Vaccine  Completed   HPV Vaccine  Aged Out   DTaP/Tdap/Td vaccine  Discontinued   Colon Cancer Screening  Discontinued  *Topic was postponed. The date shown is not the original due date.      If you have any questions or concerns, please do not hesitate to call the office at [phone].  I look forward to our next visit and until then take care and stay safe.  Regards,   Dana Allan, MD   [facility]

## 2023-07-22 NOTE — Progress Notes (Signed)
SUBJECTIVE:   Chief Complaint  Patient presents with   Mouth Lesions    On bottom lip since this am   HPI Presents for acute visit  Discussed the use of AI scribe software for clinical note transcription with the patient, who gave verbal consent to proceed.  History of Present Illness Lynn Taylor is a 77 year old female who presents with a new onset of a skin condition with associated burning sensation.  She presents with a new onset of a skin condition that began late yesterday and was noticeable upon waking this morning. She describes a burning sensation associated with the condition. No similar episodes have occurred in the past, except for a fever years ago. The condition is localized and does not appear elsewhere on her body.  She has not taken any medication for this condition previously, as it typically resolves on its own. Regarding her current medication use, she takes vitamin D supplements, specifically looking for 800 IU but only finding 400 IU. She is considering taking 2000 IU of vitamin D3 daily. She also discusses her potassium levels, which were noted to be low during a recent visit to [facility], and mentions taking potassium pills once a day, with a suggestion to increase to twice a day.  Her sodium levels were noted to be low, and she reports no previous instructions to stop her hydrochlorothiazide. Her blood pressure at home is typically around 120-130. No liver issues.      PERTINENT PMH / PSH: As above  OBJECTIVE:  BP 124/68   Pulse 72   Temp 98.2 F (36.8 C)   Resp 18   Ht 5\' 3"  (1.6 m)   Wt 146 lb 4 oz (66.3 kg)   SpO2 99%   BMI 25.91 kg/m    Physical Exam Vitals reviewed.  Constitutional:      General: She is not in acute distress.    Appearance: Normal appearance. She is normal weight. She is not ill-appearing, toxic-appearing or diaphoretic.  HENT:     Mouth/Throat:     Lips: Lesions present.     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        Left eye: No discharge.     Conjunctiva/sclera: Conjunctivae normal.  Cardiovascular:     Rate and Rhythm: Normal rate and regular rhythm.     Heart sounds: Normal heart sounds.  Pulmonary:     Effort: Pulmonary effort is normal.     Breath sounds: Normal breath sounds.  Abdominal:     General: Bowel sounds are normal.  Musculoskeletal:        General: Normal range of motion.  Skin:    General: Skin is warm and dry.  Neurological:     General: No focal deficit present.     Mental Status: She is alert and oriented to person, place, and time. Mental status is at baseline.  Psychiatric:        Mood and Affect: Mood normal.        Behavior: Behavior normal.        Thought Content: Thought content normal.        Judgment: Judgment normal.           07/22/2023    2:02 PM 05/28/2023    8:40 AM 09/18/2022    3:36 PM 09/10/2022  1:09 PM 05/21/2022   10:15 AM  Depression screen PHQ 2/9  Decreased Interest 0 0 0 0 0  Down, Depressed, Hopeless 0 0 0 0 0  PHQ - 2 Score 0 0 0 0 0  Altered sleeping 0 0   0  Tired, decreased energy 0 0   0  Change in appetite 0 0   0  Feeling bad or failure about yourself  0 0   0  Trouble concentrating 0 0   0  Moving slowly or fidgety/restless 0 0   0  Suicidal thoughts 0 0   0  PHQ-9 Score 0 0   0  Difficult doing work/chores Not difficult at all Not difficult at all   Not difficult at all      07/22/2023    2:02 PM 05/28/2023    8:40 AM 05/21/2022   10:15 AM 09/21/2019    9:13 AM  GAD 7 : Generalized Anxiety Score  Nervous, Anxious, on Edge 0 0 0 0  Control/stop worrying 0 0 0 0  Worry too much - different things 0 0 0 0  Trouble relaxing 0 0 0 0  Restless 0 0 0 0  Easily annoyed or irritable 0 0 0 0  Afraid - awful might happen 0 0 0 0  Total GAD 7 Score 0 0 0 0  Anxiety Difficulty Not difficult at all Not difficult at all Not  difficult at all Not difficult at all    ASSESSMENT/PLAN:  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 No prior antiviral therapy. -Prescribe Valtrex 1000mg , 2 tablets twice a day for one day. -Advise to start medication at the onset of prodromal symptoms in future episodes.  Orders: -     valACYclovir HCl; Take 2 tablets (2,000 mg total) by mouth in the morning and at bedtime. For 24 hours  Dispense: 60 tablet; Refill: 0  Essential hypertension Assessment & Plan: On hydrochlorothiazide with recent report of low sodium (130). -Discontinue hydrochlorothiazide. -Start Losartan 25 mg daily -Check comprehensive metabolic panel today to assess sodium, liver and kidney function.  Orders: -     Losartan Potassium; Take 1 tablet (25 mg total) by mouth daily.  Dispense: 30 tablet; Refill: 0 -     Comprehensive metabolic panel -     Magnesium  Hyponatremia Assessment & Plan: Recent levels low when checked at Oncologists office.  Asymptomatic. -Likely secondary to hydrochlorothiazide use -Check Cmet   Hypokalemia Assessment & Plan: Recent report of low potassium from oncology team. -Check Cmet today -Continue current potassium supplementation.  Orders: -     Comprehensive metabolic panel -     Magnesium  Vitamin D deficiency, unspecified Assessment & Plan: Difficulty obtaining prescribed Vitamin D3 800 IU. -Advise to purchase Vitamin D3 2000 IU and take one tablet daily.    Cancer Recent declaration of being cancer-free from oncology team. -No specific plan discussed in this conversation.     PDMP reviewed  Return in about 2 weeks (around 08/05/2023) for PCP, HTN.  Dana Allan, MD

## 2023-07-23 LAB — COMPREHENSIVE METABOLIC PANEL
ALT: 11 U/L (ref 0–35)
AST: 18 U/L (ref 0–37)
Albumin: 4.4 g/dL (ref 3.5–5.2)
Alkaline Phosphatase: 51 U/L (ref 39–117)
BUN: 16 mg/dL (ref 6–23)
CO2: 27 meq/L (ref 19–32)
Calcium: 9.7 mg/dL (ref 8.4–10.5)
Chloride: 102 meq/L (ref 96–112)
Creatinine, Ser: 0.81 mg/dL (ref 0.40–1.20)
GFR: 70.03 mL/min (ref >60.00)
Glucose, Bld: 130 mg/dL — ABNORMAL HIGH (ref 70–99)
Potassium: 4 meq/L (ref 3.5–5.1)
Sodium: 137 meq/L (ref 135–145)
Total Bilirubin: 0.6 mg/dL (ref 0.2–1.2)
Total Protein: 8 g/dL (ref 6.0–8.3)

## 2023-07-23 LAB — MAGNESIUM: Magnesium: 2 mg/dL (ref 1.5–2.5)

## 2023-07-27 ENCOUNTER — Encounter: Payer: Self-pay | Admitting: Family Medicine

## 2023-07-27 DIAGNOSIS — B001 Herpesviral vesicular dermatitis: Secondary | ICD-10-CM | POA: Insufficient documentation

## 2023-07-27 NOTE — Assessment & Plan Note (Signed)
Recurrent episodes with recent onset of a new lesion. No prior antiviral therapy. -Prescribe Valtrex 1000mg , 2 tablets twice a day for one day. -Advise to start medication at the onset of prodromal symptoms in future episodes.

## 2023-07-27 NOTE — Assessment & Plan Note (Signed)
On hydrochlorothiazide with recent report of low sodium (130). -Discontinue hydrochlorothiazide. -Start Losartan 25 mg daily -Check comprehensive metabolic panel today to assess sodium, liver and kidney function.

## 2023-07-27 NOTE — Assessment & Plan Note (Signed)
Recent levels low when checked at Oncologists office.  Asymptomatic. -Likely secondary to hydrochlorothiazide use -Check Cmet

## 2023-07-27 NOTE — Assessment & Plan Note (Signed)
Recent report of low potassium from oncology team. -Check Cmet today -Continue current potassium supplementation.

## 2023-07-27 NOTE — Assessment & Plan Note (Signed)
Difficulty obtaining prescribed Vitamin D3 800 IU. -Advise to purchase Vitamin D3 2000 IU and take one tablet daily.

## 2023-08-05 ENCOUNTER — Encounter: Payer: Self-pay | Admitting: Family Medicine

## 2023-08-05 ENCOUNTER — Ambulatory Visit (INDEPENDENT_AMBULATORY_CARE_PROVIDER_SITE_OTHER): Payer: Medicare Other | Admitting: Family Medicine

## 2023-08-05 VITALS — BP 130/78 | HR 69 | Temp 98.2°F | Resp 18 | Ht 63.0 in | Wt 148.0 lb

## 2023-08-05 DIAGNOSIS — E871 Hypo-osmolality and hyponatremia: Secondary | ICD-10-CM

## 2023-08-05 DIAGNOSIS — I10 Essential (primary) hypertension: Secondary | ICD-10-CM

## 2023-08-05 DIAGNOSIS — E876 Hypokalemia: Secondary | ICD-10-CM

## 2023-08-05 MED ORDER — LOSARTAN POTASSIUM 25 MG PO TABS: 25.0000 mg | ORAL_TABLET | Freq: Every evening | ORAL | 3 refills | Status: DC

## 2023-08-05 NOTE — Progress Notes (Signed)
 SUBJECTIVE:   Chief Complaint  Patient presents with   Medical Management of Chronic Issues    2 week follow up   HPI Presents for follow up chronic disease management  Discussed the use of AI scribe software for clinical note transcription with the patient, who gave verbal consent to proceed.  History of Present Illness Lynn Taylor is a 78 year old female with hypertension who presents for a follow-up on her blood pressure management.  She has recently started taking losartan, having taken it only once, and reports that her blood pressure reading was similar to her usual levels. No chest pain, shortness of breath, dizziness, or heart palpitations.  Her previous lab work indicated that her sodium levels have returned to normal. She notes that the lab work was done in the afternoon, unlike previous tests conducted in the morning without medication, which she speculates might have influenced her sodium levels. She is currently taking losartan at a dose of 25 mg.  She takes potassium supplements twice a day, which she started due to previous diuretic use. Her potassium level was previously low at 3.4 mmol/L but has since increased to 4.1 mmol/L.  She provided a shot record for COVID and flu vaccinations during the visit.      PERTINENT PMH / PSH: As above  OBJECTIVE:  BP 130/78   Pulse 69   Temp 98.2 F (36.8 C)   Resp 18   Ht 5\' 3"  (1.6 m)   Wt 148 lb (67.1 kg)   SpO2 100%   BMI 26.22 kg/m    Physical Exam Vitals reviewed.  Constitutional:      General: She is not in acute distress.    Appearance: Normal appearance. She is not ill-appearing, toxic-appearing or diaphoretic.  Eyes:     General:        Right eye: No discharge.        Left eye: No discharge.     Conjunctiva/sclera: Conjunctivae normal.  Cardiovascular:     Rate and Rhythm: Normal rate and regular rhythm.     Heart sounds: Normal heart sounds.  Pulmonary:     Effort: Pulmonary effort is normal.      Breath sounds: Normal breath sounds.  Abdominal:     General: Bowel sounds are normal.  Musculoskeletal:        General: Normal range of motion.  Skin:    General: Skin is warm and dry.  Neurological:     General: No focal deficit present.     Mental Status: She is alert and oriented to person, place, and time. Mental status is at baseline.  Psychiatric:        Mood and Affect: Mood normal.        Behavior: Behavior normal.        Thought Content: Thought content normal.        Judgment: Judgment normal.           07/22/2023    2:02 PM 05/28/2023    8:40 AM 09/18/2022    3:36 PM 09/10/2022    1:09 PM 05/21/2022   10:15 AM  Depression screen PHQ 2/9  Decreased Interest 0 0 0 0 0  Down, Depressed, Hopeless 0 0 0 0 0  PHQ - 2 Score 0 0 0 0 0  Altered sleeping 0 0   0  Tired, decreased energy 0 0   0  Change in appetite 0 0   0  Feeling bad or failure  about yourself  0 0   0  Trouble concentrating 0 0   0  Moving slowly or fidgety/restless 0 0   0  Suicidal thoughts 0 0   0  PHQ-9 Score 0 0   0  Difficult doing work/chores Not difficult at all Not difficult at all   Not difficult at all      07/22/2023    2:02 PM 05/28/2023    8:40 AM 05/21/2022   10:15 AM 09/21/2019    9:13 AM  GAD 7 : Generalized Anxiety Score  Nervous, Anxious, on Edge 0 0 0 0  Control/stop worrying 0 0 0 0  Worry too much - different things 0 0 0 0  Trouble relaxing 0 0 0 0  Restless 0 0 0 0  Easily annoyed or irritable 0 0 0 0  Afraid - awful might happen 0 0 0 0  Total GAD 7 Score 0 0 0 0  Anxiety Difficulty Not difficult at all Not difficult at all Not difficult at all Not difficult at all    ASSESSMENT/PLAN:  Essential hypertension Assessment & Plan: Blood pressure controlled on Losartan 25mg  daily. No symptoms of hypotension reported. -Refill Losartan 25mg  daily. -Check blood pressure at home periodically and report if readings start to increase.  Orders: -     Losartan Potassium;  Take 1 tablet (25 mg total) by mouth at bedtime.  Dispense: 90 tablet; Refill: 3 -     Comprehensive metabolic panel; Future  Hyponatremia Assessment & Plan: Resolved after discontinued use of diuretic  Orders: -     Comprehensive metabolic panel; Future  Hypokalemia Assessment & Plan: Potassium level normalized on current regimen. Patient was taking potassium supplementation due to previous diuretic use, which has since been discontinued. -Discontinue potassium supplementation. -Check potassium level in 3 months.  Orders: -     Comprehensive metabolic panel; Future    PDMP reviewed  Return in about 3 months (around 11/02/2023) for PCP, HTN.  Dana Allan, MD

## 2023-08-05 NOTE — Patient Instructions (Addendum)
 It was a pleasure meeting you today. Thank you for allowing me to take part in your health care.  Our goals for today as we discussed include:  Continue Losartan 25 mg at night Monitor blood pressure.  Goal <150/90   Stop Potassium supplements  Sodium and potassium has returned to normal in blood work  Will repeat in 3 months   This is a list of the screening recommended for you and due dates:  Health Maintenance  Topic Date Due   COVID-19 Vaccine (7 - 2024-25 season) 02/08/2023   Medicare Annual Wellness Visit  09/18/2023   Flu Shot  09/07/2023*   Mammogram  08/08/2023   Pneumonia Vaccine  Completed   DEXA scan (bone density measurement)  Completed   Hepatitis C Screening  Completed   Zoster (Shingles) Vaccine  Completed   HPV Vaccine  Aged Out   DTaP/Tdap/Td vaccine  Discontinued   Colon Cancer Screening  Discontinued  *Topic was postponed. The date shown is not the original due date.     If you have any questions or concerns, please do not hesitate to call the office at [phone].  I look forward to our next visit and until then take care and stay safe.  Regards,   Dana Allan, MD   [facility]

## 2023-08-09 ENCOUNTER — Encounter: Payer: Self-pay | Admitting: Family Medicine

## 2023-08-09 NOTE — Assessment & Plan Note (Signed)
 Potassium level normalized on current regimen. Patient was taking potassium supplementation due to previous diuretic use, which has since been discontinued. -Discontinue potassium supplementation. -Check potassium level in 3 months.

## 2023-08-09 NOTE — Assessment & Plan Note (Addendum)
 Blood pressure controlled on Losartan 25mg  daily. No symptoms of hypotension reported. -Refill Losartan 25mg  daily. -Check blood pressure at home periodically and report if readings start to increase.

## 2023-08-09 NOTE — Assessment & Plan Note (Signed)
 Resolved after discontinued use of diuretic

## 2023-08-11 ENCOUNTER — Ambulatory Visit
Admission: RE | Admit: 2023-08-11 | Discharge: 2023-08-11 | Disposition: A | Discharge status: Home / Self Care | Payer: Medicare Other | Source: Ambulatory Visit | Attending: Family Medicine | Admitting: Family Medicine

## 2023-08-11 DIAGNOSIS — Z1231 Encounter for screening mammogram for malignant neoplasm of breast: Secondary | ICD-10-CM | POA: Diagnosis not present

## 2023-08-31 ENCOUNTER — Inpatient Hospital Stay: Attending: Oncology

## 2023-08-31 DIAGNOSIS — Z95828 Presence of other vascular implants and grafts: Secondary | ICD-10-CM

## 2023-08-31 DIAGNOSIS — Z8507 Personal history of malignant neoplasm of pancreas: Secondary | ICD-10-CM | POA: Insufficient documentation

## 2023-08-31 MED ORDER — SODIUM CHLORIDE 0.9% FLUSH
10.0000 mL | INTRAVENOUS | Status: DC | PRN
Start: 2023-08-31 — End: 2023-08-31
  Administered 2023-08-31: 10 mL via INTRAVENOUS
  Filled 2023-08-31: qty 10

## 2023-08-31 MED ORDER — HEPARIN SOD (PORK) LOCK FLUSH 100 UNIT/ML IV SOLN
500.0000 [IU] | Freq: Once | INTRAVENOUS | Status: AC
  Administered 2023-08-31: 500 [IU] via INTRAVENOUS
  Filled 2023-08-31: qty 5

## 2023-09-02 ENCOUNTER — Other Ambulatory Visit: Payer: Self-pay | Admitting: Family Medicine

## 2023-09-02 DIAGNOSIS — E039 Hypothyroidism, unspecified: Secondary | ICD-10-CM

## 2023-09-07 ENCOUNTER — Inpatient Hospital Stay: Payer: Medicare Other

## 2023-09-28 ENCOUNTER — Other Ambulatory Visit: Payer: Self-pay | Admitting: Family Medicine

## 2023-09-28 DIAGNOSIS — E039 Hypothyroidism, unspecified: Secondary | ICD-10-CM

## 2023-09-28 MED ORDER — LEVOTHYROXINE SODIUM 100 MCG PO TABS
100.0000 ug | ORAL_TABLET | Freq: Every day | ORAL | 0 refills | Status: DC
Start: 2023-09-28 — End: 2023-11-03

## 2023-10-12 ENCOUNTER — Ambulatory Visit (INDEPENDENT_AMBULATORY_CARE_PROVIDER_SITE_OTHER): Admitting: *Deleted

## 2023-10-12 VITALS — Ht 63.0 in | Wt 148.0 lb

## 2023-10-12 DIAGNOSIS — Z780 Asymptomatic menopausal state: Secondary | ICD-10-CM | POA: Diagnosis not present

## 2023-10-12 DIAGNOSIS — Z0000 Encounter for general adult medical examination without abnormal findings: Secondary | ICD-10-CM | POA: Diagnosis not present

## 2023-10-12 NOTE — Patient Instructions (Addendum)
 Lynn Taylor , Thank you for taking time to come for your Medicare Wellness Visit. I appreciate your ongoing commitment to your health goals. Please review the following plan we discussed and let me know if I can assist you in the future.   Referrals/Orders/Follow-Ups/Clinician Recommendations: Your Bone density/Dexa has been ordered. You have an order for:  []   2D Mammogram  []   3D Mammogram  [x]   Bone Density     Please call for appointment:  [facility]  [address] [place] [address] [phone]    Make sure to wear two-piece clothing.  No lotions, powders, or deodorants the day of the appointment. Make sure to bring picture ID and insurance card.  Bring list of medications you are currently taking including any supplements.    This is a list of the screening recommended for you and due dates:  Health Maintenance  Topic Date Due   COVID-19 Vaccine (8 - 2024-25 season) 08/12/2023   Flu Shot  01/08/2024   Mammogram  08/10/2024   Medicare Annual Wellness Visit  10/11/2024   Pneumonia Vaccine  Completed   DEXA scan (bone density measurement)  Completed   Hepatitis C Screening  Completed   Zoster (Shingles) Vaccine  Completed   HPV Vaccine  Aged Out   Meningitis B Vaccine  Aged Out   DTaP/Tdap/Td vaccine  Discontinued   Colon Cancer Screening  Discontinued    Advanced directives: (Declined) Advance directive discussed with you today. Even though you declined this today, please call our office should you change your mind, and we can give you the proper paperwork for you to fill out.  Next Medicare Annual Wellness Visit scheduled for next year: Yes 10/12/24 @ 10:50  Have you seen your provider in the last 6 months (3 months if uncontrolled diabetes)? Yes 08/05/23

## 2023-10-12 NOTE — Progress Notes (Signed)
 Subjective:   Lynn Taylor is a 78 y.o. who presents for a Medicare Wellness preventive visit.  Visit Complete: Virtual I connected with  Lynn Taylor on 10/12/23 by a audio enabled telemedicine application and verified that I am speaking with the correct person using two identifiers.  Patient Location: Home  Provider Location: Office/Clinic  I discussed the limitations of evaluation and management by telemedicine. The patient expressed understanding and agreed to proceed.  Vital Signs: Because this visit was a virtual/telehealth visit, some criteria may be missing or patient reported. Any vitals not documented were not able to be obtained and vitals that have been documented are patient reported.  VideoDeclined- This patient declined Librarian, academic. Therefore the visit was completed with audio only.  Persons Participating in Visit: Patient.  AWV Questionnaire: No: Patient Medicare AWV questionnaire was not completed prior to this visit.  Cardiac Risk Factors include: advanced age (>3men, >29 women);hypertension     Objective:    Today's Vitals   10/12/23 1539  Weight: 148 lb (67.1 kg)  Height: 5\' 3"  (1.6 m)   Body mass index is 26.22 kg/m.     10/12/2023    3:52 PM 09/18/2022    3:36 PM 06/30/2022   10:31 AM 01/08/2022    1:00 PM 12/26/2021   10:54 AM 12/18/2021    6:35 AM 11/27/2021   11:21 AM  Advanced Directives  Does Patient Have a Medical Advance Directive? No No Yes No No No No  Type of Advance Directive   Healthcare Power of Attorney      Would patient like information on creating a medical advance directive? No - Patient declined No - Patient declined  No - Patient declined No - Patient declined No - Patient declined No - Patient declined    Current Medications (verified) Outpatient Encounter Medications as of 10/12/2023  Medication Sig   Amylase-Lipase-Protease (PANCREASE PO) Take by mouth. Takes 6 daily OTC   ascorbic acid  (VITAMIN C) 500 MG tablet    aspirin  (ASPIRIN  81) 81 MG EC tablet Take 2 tablets (162 mg total) by mouth daily. Swallow whole.   Cholecalciferol  (VITAMIN D ) 50 MCG (2000 UT) CAPS Take by mouth daily.   fluticasone  (FLONASE ) 50 MCG/ACT nasal spray Place 2 sprays into both nostrils daily. After nasal saline   levothyroxine  (SYNTHROID ) 100 MCG tablet Take 1 tablet (100 mcg total) by mouth daily before breakfast.   losartan  (COZAAR ) 25 MG tablet Take 1 tablet (25 mg total) by mouth at bedtime.   Magnesium  Cl-Calcium  Carbonate (SLOW-MAG PO) Take by mouth. 2 QD   Multiple Vitamins-Minerals (CENTRUM SILVER 50+WOMEN PO) Take by mouth.   TURMERIC CURCUMIN PO Take by mouth 3 (three) times daily with meals.   valACYclovir  (VALTREX ) 1000 MG tablet Take 2 tablets (2,000 mg total) by mouth in the morning and at bedtime. For 24 hours (Patient not taking: Reported on 10/12/2023)   Vitamin D , Ergocalciferol , (DRISDOL ) 1.25 MG (50000 UNIT) CAPS capsule Take 1 capsule (50,000 Units total) by mouth every 7 (seven) days. (Patient not taking: Reported on 10/12/2023)   No facility-administered encounter medications on file as of 10/12/2023.    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   Herniated disc, cervical 04/29/2019   History of deep vein thrombosis (DVT) of lower extremity 12/26/2021   Hyperbilirubinemia 04/28/2018   Hypertension    Hypomagnesemia 11/25/2018   Hypomagnesemia 11/25/2018   Hypothyroidism    Jaundice    Lumbar herniated disc 04/29/2019   Malignant neoplasm of head of pancreas (HCC) 06/08/2018   Malignant neoplasm of head of pancreas (HCC) 06/08/2018   Malignant neoplasm of head of pancreas (HCC) 06/08/2018   Personal history of chemotherapy 2019    Pancreatic cancer   Wears dentures    full upper and lower   Past Surgical History:  Procedure Laterality Date   CATARACT EXTRACTION W/PHACO Right 11/27/2021   Procedure: CATARACT EXTRACTION PHACO AND INTRAOCULAR LENS PLACEMENT (IOC) RIGHT VISION BLUE, OMIDRIA   7.91 00:48.1;  Surgeon: Trudi Fus, MD;  Location: [facility];  Service: Ophthalmology;  Laterality: Right;   CATARACT EXTRACTION W/PHACO Left 12/18/2021   Procedure: CATARACT EXTRACTION PHACO AND INTRAOCULAR LENS PLACEMENT (IOC) LEFT;  Surgeon: Trudi Fus, MD;  Location: [facility];  Service: Ophthalmology;  Laterality: Left;  7.09 0:56.0   CHOLECYSTECTOMY     COLONOSCOPY N/A 12/02/2016   Procedure: COLONOSCOPY;  Surgeon: Deveron Fly, MD;  Location: [facility];  Service: Endoscopy;  Laterality: N/A;   ERCP N/A 04/30/2018   Procedure: ENDOSCOPIC RETROGRADE CHOLANGIOPANCREATOGRAPHY (ERCP);  Surgeon: Marnee Sink, MD;  Location: [facility];  Service: Endoscopy;  Laterality: N/A;   ERCP N/A 05/05/2018   Procedure: ENDOSCOPIC RETROGRADE CHOLANGIOPANCREATOGRAPHY (ERCP);  Surgeon: Marnee Sink, MD;  Location: [facility] END[facility]COPY;  Service: Endoscopy;  Laterality: N/A;   PORTA CATH  INSERTION N/A 06/28/2018   Procedure: PORTA CATH INSERTION;  Surgeon: Celso College, MD;  Location: [facility] INVASIVE CV LAB;  Service: Cardiovascular;  Laterality: N/A;   Family History  Adopted: Yes  Problem Relation Age of Onset   Diabetes Mellitus I Other    Alcoholism Other    Hypertension Other    Hyperlipidemia Other    Coronary artery disease Other    Stroke Other    Osteoarthritis Other    Migraines Other    Heart Problems Mother    Stroke Sister    CAD Brother    Stroke Brother    Breast cancer Neg Hx    Social History   Socioeconomic History   Marital status: Married    Spouse name: Not on file   Number of children: Not on file   Years of education: Not on file   Highest education level: Associate degree: occupational, Scientist, product/process development, or vocational program  Occupational History   Not on file  Tobacco Use   Smoking status: Former    Current packs/day: 0.00    Types: Cigarettes    Quit date: 04/14/1988    Years since quitting: 35.5   Smokeless tobacco: Never  Vaping Use   Vaping status: Never Used  Substance and Sexual  Activity   Alcohol use: Yes    Comment: social drinker   Drug use: No   Sexual activity: Not Currently  Other Topics Concern   Not on file  Social History Narrative   Lives at home married    In order of [place] star   Social Drivers of Health   Financial Resource Strain: Low Risk  (10/12/2023)   Overall Financial Resource Strain (CARDIA)    Difficulty of Paying Living Expenses: Not hard at all  Food Insecurity: No Food Insecurity (10/12/2023)   Hunger Vital Sign    Worried About Running Out of Food in the Last Year: Never true    Ran Out of Food in the Last Year: Never true  Transportation Needs: No Transportation Needs (10/12/2023)   PRAPARE - Administrator, Civil Service (Medical): No    Lack of Transportation (Non-Medical): No  Physical Activity: Inactive (10/12/2023)   Exercise Vital Sign    Days of Exercise per Week: 0 days     Minutes of Exercise per Session: 0 min  Stress: No Stress Concern Present (10/12/2023)   [employer] of Occupational Health - Occupational Stress Questionnaire    Feeling of Stress : Not at all  Social Connections: Socially Integrated (10/12/2023)   Social Connection and Isolation Panel [NHANES]    Frequency of Communication with Friends and Family: More than three times a week    Frequency of Social Gatherings with Friends and Family: Once a week    Attends Religious Services: More than 4 times per year    Active Member of [employer] or Organizations: Yes    Attends Engineer, structural: More than 4 times per year    Marital Status: Married    Tobacco Counseling Counseling given: Not Answered    Clinical Intake:  Pre-visit preparation completed: Yes  Pain : No/denies pain     BMI - recorded: 26.22 Nutritional Status: BMI 25 -29 Overweight Nutritional Risks: None Diabetes: No  Lab Results  Component Value Date   HGBA1C 6.3 01/21/2023   HGBA1C 6.0 11/15/2021   HGBA1C 6.0 09/04/2020     How often do you need to have someone help you when you read instructions, pamphlets, or other written materials from your doctor or pharmacy?: 1 - Never  Interpreter Needed?: No  Information entered by :: R. Mali Eppard LPN   Activities of Daily Living     10/12/2023    3:40 PM  In your present state of health, do you have any difficulty performing the following activities:  Hearing? 0  Vision? 0  Comment readers  Difficulty concentrating or making decisions? 0  Walking or climbing stairs? 1  Dressing or bathing? 0  Doing errands, shopping? 0  Preparing Food and eating ? N  Using the Toilet? N  In the past six months, have you accidently leaked urine? N  Do you have problems with loss of bowel control? N  Managing your Medications? N  Managing your Finances? N  Housekeeping or managing your Housekeeping? N    Patient Care Team: Valli Gaw, MD as PCP - General (Family  Medicine) Rochell Chroman, RN as Registered Nurse Luke Salaam, MD as Consulting Physician (Gastroenterology) Rochell Chroman, RN as Oncology Nurse Navigator Timmy Forbes, MD as Consulting Physician (Oncology) Ren Carne Michael Evan, MD as Referring Physician (Oncology) Vonna Guardian Donald Frost, MD as Referring Physician (Vascular Surgery)  Indicate any recent Medical Services you may have received from other than [facility] providers in the  past year (date may be approximate).     Assessment:   This is a routine wellness examination for [facility].  Hearing/Vision screen Hearing Screening - Comments:: No issues Vision Screening - Comments:: readers   Goals Addressed             This Visit's Progress    Patient Stated       Wants to continue to stay active       Depression Screen     10/12/2023    3:48 PM 07/22/2023    2:02 PM 05/28/2023    8:40 AM 09/18/2022    3:36 PM 09/10/2022    1:09 PM 05/21/2022   10:15 AM 11/15/2021    8:09 AM  PHQ 2/9 Scores  PHQ - 2 Score 0 0 0 0 0 0 0  PHQ- 9 Score 0 0 0   0     Fall Risk     10/12/2023    3:41 PM 05/28/2023    8:40 AM 09/18/2022    3:37 PM 09/10/2022    1:09 PM 05/21/2022   10:15 AM  Fall Risk   Falls in the past year? 0 0 0 0 0  Number falls in past yr: 0 0 0 0 0  Injury with Fall? 0 0 0 0 0  Risk for fall due to : No Fall Risks No Fall Risks   No Fall Risks  Follow up Falls prevention discussed;Falls evaluation completed Falls evaluation completed;Education provided Falls evaluation completed;Falls prevention discussed Falls evaluation completed Falls evaluation completed    MEDICARE RISK AT HOME:  Medicare Risk at Home Any stairs in or around the home?: No If so, are there any without handrails?: No Home free of loose throw rugs in walkways, pet beds, electrical cords, etc?: Yes Adequate lighting in your home to reduce risk of falls?: Yes Life alert?: No Use of a cane, walker or w/c?: No Grab bars in the bathroom?: Yes Shower chair or  bench in shower?: Yes Elevated toilet seat or a handicapped toilet?: Yes  TIMED UP AND GO:  Was the test performed?  No  Cognitive Function: 6CIT completed        10/12/2023    3:52 PM 09/18/2022    3:39 PM  6CIT Screen  What Year? 0 points 0 points  What month? 0 points 0 points  What time? 0 points 0 points  Count back from 20 0 points 0 points  Months in reverse 0 points 0 points  Repeat phrase 2 points 0 points  Total Score 2 points 0 points    Immunizations Immunization History  Administered Date(s) Administered   Fluad Quad(high Dose 65+) 05/18/2020, 02/18/2021, 03/12/2022   Influenza-Unspecified 05/29/2023   PFIZER(Purple Top)SARS-COV-2 Vaccination 07/21/2019, 08/11/2019, 03/12/2020, 09/14/2020, 06/17/2023   PNEUMOCOCCAL CONJUGATE-20 05/21/2022   Pfizer Covid-19 Vaccine Bivalent Booster 51yrs & up 04/05/2021, 03/20/2022   Pfizer(Comirnaty)Fall Seasonal Vaccine 12 years and older 04/15/2022   Pneumococcal Conjugate-13 05/17/2021   RSV,unspecified 05/22/2022   Tdap 05/29/2023   Zoster Recombinant(Shingrix ) 03/20/2022, 08/22/2022    Screening Tests Health Maintenance  Topic Date Due   COVID-19 Vaccine (8 - 2024-25 season) 08/12/2023   Medicare Annual Wellness (AWV)  09/18/2023   INFLUENZA VACCINE  01/08/2024   MAMMOGRAM  08/10/2024   Pneumonia Vaccine 70+ Years old  Completed   DEXA SCAN  Completed   Hepatitis C Screening  Completed   Zoster Vaccines- Shingrix   Completed   HPV VACCINES  Aged Out   Meningococcal B  Vaccine  Aged Out   DTaP/Tdap/Td  Discontinued   Colonoscopy  Discontinued    Health Maintenance  Health Maintenance Due  Topic Date Due   COVID-19 Vaccine (8 - 2024-25 season) 08/12/2023   Medicare Annual Wellness (AWV)  09/18/2023   Health Maintenance Items Addressed: DEXA ordered  Additional Screening:  Vision Screening: Recommended annual ophthalmology exams for early detection of glaucoma and other disorders of the eye. Up to date  [facility]  Dental Screening: Recommended annual dental exams for proper oral hygiene  Community Resource Referral / Chronic Care Management: CRR required this visit?  No   CCM required this visit?  No     Plan:     I have personally reviewed and noted the following in the patient's chart:   Medical and social history Use of alcohol, tobacco or illicit drugs  Current medications and supplements including opioid prescriptions. Patient is not currently taking opioid prescriptions. Functional ability and status Nutritional status Physical activity Advanced directives List of other physicians Hospitalizations, surgeries, and ER visits in previous 12 months Vitals Screenings to include cognitive, depression, and falls Referrals and appointments  In addition, I have reviewed and discussed with patient certain preventive protocols, quality metrics, and best practice recommendations. A written personalized care plan for preventive services as well as general preventive health recommendations were provided to patient.     Felicitas Horse, LPN   02/11/6386   After Visit Summary: (MyChart) Due to this being a telephonic visit, the after visit summary with patients personalized plan was offered to patient via MyChart   Notes: Nothing significant to report at this time.

## 2023-10-16 ENCOUNTER — Encounter: Payer: Self-pay | Admitting: Family Medicine

## 2023-11-03 ENCOUNTER — Inpatient Hospital Stay: Payer: Medicare Other | Attending: Oncology

## 2023-11-03 ENCOUNTER — Encounter: Payer: Self-pay | Admitting: Family Medicine

## 2023-11-03 ENCOUNTER — Ambulatory Visit: Payer: Self-pay | Admitting: Family Medicine

## 2023-11-03 ENCOUNTER — Ambulatory Visit: Payer: Medicare Other | Admitting: Family Medicine

## 2023-11-03 VITALS — BP 173/76 | HR 74 | Temp 98.0°F | Resp 20 | Ht 63.0 in | Wt 142.4 lb

## 2023-11-03 DIAGNOSIS — I10 Essential (primary) hypertension: Secondary | ICD-10-CM | POA: Diagnosis not present

## 2023-11-03 DIAGNOSIS — D649 Anemia, unspecified: Secondary | ICD-10-CM | POA: Diagnosis not present

## 2023-11-03 DIAGNOSIS — Z95828 Presence of other vascular implants and grafts: Secondary | ICD-10-CM

## 2023-11-03 DIAGNOSIS — E559 Vitamin D deficiency, unspecified: Secondary | ICD-10-CM

## 2023-11-03 DIAGNOSIS — E039 Hypothyroidism, unspecified: Secondary | ICD-10-CM

## 2023-11-03 DIAGNOSIS — Z8507 Personal history of malignant neoplasm of pancreas: Secondary | ICD-10-CM | POA: Diagnosis present

## 2023-11-03 LAB — COMPREHENSIVE METABOLIC PANEL WITH GFR
ALT: 12 U/L (ref 0–35)
AST: 18 U/L (ref 0–37)
Albumin: 4.7 g/dL (ref 3.5–5.2)
Alkaline Phosphatase: 56 U/L (ref 39–117)
BUN: 11 mg/dL (ref 6–23)
CO2: 24 meq/L (ref 19–32)
Calcium: 10.1 mg/dL (ref 8.4–10.5)
Chloride: 102 meq/L (ref 96–112)
Creatinine, Ser: 0.78 mg/dL (ref 0.40–1.20)
GFR: 73.13 mL/min (ref >60.00)
Glucose, Bld: 106 mg/dL — ABNORMAL HIGH (ref 70–99)
Potassium: 3.5 meq/L (ref 3.5–5.1)
Sodium: 137 meq/L (ref 135–145)
Total Bilirubin: 0.5 mg/dL (ref 0.2–1.2)
Total Protein: 8.3 g/dL (ref 6.0–8.3)

## 2023-11-03 LAB — TSH: TSH: 26.46 u[IU]/mL — ABNORMAL HIGH (ref 0.35–5.50)

## 2023-11-03 LAB — VITAMIN D 25 HYDROXY (VIT D DEFICIENCY, FRACTURES): VITD: 21.18 ng/mL — ABNORMAL LOW (ref 30.00–100.00)

## 2023-11-03 MED ORDER — LOSARTAN POTASSIUM 25 MG PO TABS: 25.0000 mg | ORAL_TABLET | Freq: Every evening | ORAL | 3 refills | Status: DC

## 2023-11-03 MED ORDER — LEVOTHYROXINE SODIUM 100 MCG PO TABS: 100.0000 ug | ORAL_TABLET | Freq: Every day | ORAL | 0 refills | Status: DC

## 2023-11-03 MED ORDER — HEPARIN SOD (PORK) LOCK FLUSH 100 UNIT/ML IV SOLN
500.0000 [IU] | Freq: Once | INTRAVENOUS | Status: AC
  Administered 2023-11-03: 500 [IU] via INTRAVENOUS
  Filled 2023-11-03: qty 5

## 2023-11-03 MED ORDER — SODIUM CHLORIDE 0.9% FLUSH
10.0000 mL | Freq: Once | INTRAVENOUS | Status: AC
  Administered 2023-11-03: 10 mL via INTRAVENOUS
  Filled 2023-11-03: qty 10

## 2023-11-03 NOTE — Progress Notes (Signed)
 SUBJECTIVE:   Chief Complaint  Patient presents with   Hypertension    3 month follow up   HPI Presents for follow up chronic disease management  Discussed the use of AI scribe software for clinical note transcription with the patient, who gave verbal consent to proceed.  History of Present Illness Lynn Taylor is a 78 year old female with hypertension who presents for a follow-up on her blood pressure management.  She takes her blood pressure medication at night and notes that her home blood pressure readings are typically around 135 mmHg, which she considers acceptable. No headaches, chest pain, or shortness of breath. She attributes a potential increase in her blood pressure to being 'irritated' due to broken dentures, which have not been returned as expected, causing her to feel 'half ass hungry' and 'hangry'.  She mentions a mosquito bite on her leg that has left a knot and itches, particularly in the crease of her leg. She has been using alcohol on it and has not used calamine lotion. She describes scratching it open at night due to the itchiness.  She does not believe she needs any medication refills at this time and is planning to leave at the end of June. She has found another doctor but cannot schedule an appointment until June.     PERTINENT PMH / PSH: As above  OBJECTIVE:  BP (!) 173/76   Pulse 74   Temp 98 F (36.7 C)   Resp 20   Ht 5\' 3"  (1.6 m)   Wt 142 lb 6 oz (64.6 kg)   SpO2 98%   BMI 25.22 kg/m    Physical Exam Vitals reviewed.  Constitutional:      General: She is not in acute distress.    Appearance: Normal appearance. She is not ill-appearing, toxic-appearing or diaphoretic.  Eyes:     General:        Right eye: No discharge.        Left eye: No discharge.     Conjunctiva/sclera: Conjunctivae normal.  Neck:     Thyroid : No thyromegaly or thyroid  tenderness.  Cardiovascular:     Rate and Rhythm: Normal rate and regular rhythm.     Heart  sounds: Normal heart sounds.  Pulmonary:     Effort: Pulmonary effort is normal.     Breath sounds: Normal breath sounds.  Abdominal:     General: Bowel sounds are normal.  Musculoskeletal:        General: Normal range of motion.  Skin:    General: Skin is warm and dry.  Neurological:     General: No focal deficit present.     Mental Status: She is alert and oriented to person, place, and time. Mental status is at baseline.  Psychiatric:        Mood and Affect: Mood normal.        Behavior: Behavior normal.        Thought Content: Thought content normal.        Judgment: Judgment normal.           11/03/2023    8:17 AM 10/12/2023    3:48 PM 07/22/2023    2:02 PM 05/28/2023    8:40 AM 09/18/2022    3:36 PM  Depression screen PHQ 2/9  Decreased Interest 0 0 0 0 0  Down, Depressed, Hopeless 0 0 0 0 0  PHQ - 2 Score 0 0 0 0 0  Altered sleeping 0 0 0 0  Tired, decreased energy 0 0 0 0   Change in appetite 0 0 0 0   Feeling bad or failure about yourself  0 0 0 0   Trouble concentrating 0 0 0 0   Moving slowly or fidgety/restless 0 0 0 0   Suicidal thoughts 0 0 0 0   PHQ-9 Score 0 0 0 0   Difficult doing work/chores Not difficult at all Not difficult at all Not difficult at all Not difficult at all       11/03/2023    8:17 AM 07/22/2023    2:02 PM 05/28/2023    8:40 AM 05/21/2022   10:15 AM  GAD 7 : Generalized Anxiety Score  Nervous, Anxious, on Edge 0 0 0 0  Control/stop worrying 0 0 0 0  Worry too much - different things 0 0 0 0  Trouble relaxing 0 0 0 0  Restless 0 0 0 0  Easily annoyed or irritable 0 0 0 0  Afraid - awful might happen 0 0 0 0  Total GAD 7 Score 0 0 0 0  Anxiety Difficulty Not difficult at all Not difficult at all Not difficult at all Not difficult at all    ASSESSMENT/PLAN:  Essential hypertension Assessment & Plan: Home readings average 135/80 mmHg, indicating controlled hypertension. Current elevation likely stress-related. No symptoms  reported. Advised monitoring for symptoms indicating intervention need. - Recheck blood pressure post-stressor resolution. - Refill Losartan  25 mg daily - Regular home blood pressure monitoring. - Report if readings consistently reach 165/100 mmHg or higher. - Report symptoms such as headaches or visual changes.  Orders: -     Comprehensive metabolic panel with GFR -     Losartan  Potassium; Take 1 tablet (25 mg total) by mouth at bedtime.  Dispense: 90 tablet; Refill: 3  Acquired hypothyroidism Assessment & Plan: Asymptomatic Check TSH levels Continue Levothyroxine  100 mcg daily  Addendum TSH elevated.   Increase Levothyroxine  from 100 mcg to 125 mcg daily. Repeat TSH in 4 weeks  Orders: -     TSH  Vitamin D  deficiency -     VITAMIN D  25 Hydroxy (Vit-D Deficiency, Fractures)    PDMP reviewed  Return in 20 days (on 11/23/2023), or if symptoms worsen or fail to improve, for PCP.  Valli Gaw, MD

## 2023-11-03 NOTE — Patient Instructions (Addendum)
 It was a pleasure meeting you today. Thank you for allowing me to take part in your health care.  Our goals for today as we discussed include:  We will get some labs today.  If they are abnormal or we need to do something about them, I will call you.  If they are normal, I will send you a message on MyChart (if it is active) or a letter in the mail.  If you don't hear from us  in 2 weeks, please call the office at the number below.   Refills sent for requested medication  Continue current blood pressure medication.  Monitor blood pressure at home.  If increases >150/90 notify MD    This is a list of the screening recommended for you and due dates:  Health Maintenance  Topic Date Due   COVID-19 Vaccine (8 - 2024-25 season) 08/12/2023   Flu Shot  01/08/2024   Mammogram  08/10/2024   Medicare Annual Wellness Visit  10/11/2024   Pneumonia Vaccine  Completed   DEXA scan (bone density measurement)  Completed   Hepatitis C Screening  Completed   Zoster (Shingles) Vaccine  Completed   HPV Vaccine  Aged Out   Meningitis B Vaccine  Aged Out   DTaP/Tdap/Td vaccine  Discontinued   Colon Cancer Screening  Discontinued     If you have any questions or concerns, please do not hesitate to call the office at [phone].  I look forward to our next visit and until then take care and stay safe.  Regards,   Valli Gaw, MD   [facility]

## 2023-11-04 ENCOUNTER — Other Ambulatory Visit: Payer: Self-pay | Admitting: Family Medicine

## 2023-11-04 DIAGNOSIS — E039 Hypothyroidism, unspecified: Secondary | ICD-10-CM

## 2023-11-04 MED ORDER — LEVOTHYROXINE SODIUM 125 MCG PO TABS: 125.0000 ug | ORAL_TABLET | Freq: Every day | ORAL | 0 refills | Status: DC

## 2023-11-08 ENCOUNTER — Encounter: Payer: Self-pay | Admitting: Family Medicine

## 2023-11-08 NOTE — Assessment & Plan Note (Signed)
 Home readings average 135/80 mmHg, indicating controlled hypertension. Current elevation likely stress-related. No symptoms reported. Advised monitoring for symptoms indicating intervention need. - Recheck blood pressure post-stressor resolution. - Refill Losartan  25 mg daily - Regular home blood pressure monitoring. - Report if readings consistently reach 165/100 mmHg or higher. - Report symptoms such as headaches or visual changes.

## 2023-11-08 NOTE — Assessment & Plan Note (Signed)
 Asymptomatic Check TSH levels Continue Levothyroxine  100 mcg daily  Addendum TSH elevated.   Increase Levothyroxine  from 100 mcg to 125 mcg daily. Repeat TSH in 4 weeks

## 2023-11-17 ENCOUNTER — Encounter: Payer: Self-pay | Admitting: Family Medicine

## 2023-11-19 ENCOUNTER — Other Ambulatory Visit (INDEPENDENT_AMBULATORY_CARE_PROVIDER_SITE_OTHER): Payer: Medicare Other

## 2023-11-19 DIAGNOSIS — I10 Essential (primary) hypertension: Secondary | ICD-10-CM | POA: Diagnosis not present

## 2023-11-19 DIAGNOSIS — I700 Atherosclerosis of aorta: Secondary | ICD-10-CM

## 2023-11-19 DIAGNOSIS — R7989 Other specified abnormal findings of blood chemistry: Secondary | ICD-10-CM

## 2023-11-19 LAB — CBC WITH DIFFERENTIAL/PLATELET
Basophils Absolute: 0 10*3/uL (ref 0.0–0.1)
Basophils Relative: 0.9 % (ref 0.0–3.0)
Eosinophils Absolute: 0.1 10*3/uL (ref 0.0–0.7)
Eosinophils Relative: 1.1 % (ref 0.0–5.0)
HCT: 33.9 % — ABNORMAL LOW (ref 36.0–46.0)
Hemoglobin: 11.2 g/dL — ABNORMAL LOW (ref 12.0–15.0)
Lymphocytes Relative: 30 % (ref 12.0–46.0)
Lymphs Abs: 1.7 10*3/uL (ref 0.7–4.0)
MCHC: 32.9 g/dL (ref 30.0–36.0)
MCV: 85.5 fl (ref 78.0–100.0)
Monocytes Absolute: 0.3 10*3/uL (ref 0.1–1.0)
Monocytes Relative: 5.3 % (ref 3.0–12.0)
Neutro Abs: 3.4 10*3/uL (ref 1.4–7.7)
Neutrophils Relative %: 62.7 % (ref 43.0–77.0)
Platelets: 179 10*3/uL (ref 150.0–400.0)
RBC: 3.97 Mil/uL (ref 3.87–5.11)
RDW: 15.6 % — ABNORMAL HIGH (ref 11.5–15.5)
WBC: 5.5 10*3/uL (ref 4.0–10.5)

## 2023-11-19 LAB — LIPID PANEL
Cholesterol: 180 mg/dL (ref 0–200)
HDL: 77.4 mg/dL (ref >39.00)
LDL Cholesterol: 91 mg/dL (ref 0–99)
NonHDL: 102.93
Total CHOL/HDL Ratio: 2
Triglycerides: 60 mg/dL (ref 0.0–149.0)
VLDL: 12 mg/dL (ref 0.0–40.0)

## 2023-11-19 LAB — COMPREHENSIVE METABOLIC PANEL WITH GFR
ALT: 12 U/L (ref 0–35)
AST: 16 U/L (ref 0–37)
Albumin: 4.1 g/dL (ref 3.5–5.2)
Alkaline Phosphatase: 50 U/L (ref 39–117)
BUN: 10 mg/dL (ref 6–23)
CO2: 26 meq/L (ref 19–32)
Calcium: 9.2 mg/dL (ref 8.4–10.5)
Chloride: 106 meq/L (ref 96–112)
Creatinine, Ser: 0.73 mg/dL (ref 0.40–1.20)
GFR: 79.15 mL/min (ref >60.00)
Glucose, Bld: 101 mg/dL — ABNORMAL HIGH (ref 70–99)
Potassium: 3.9 meq/L (ref 3.5–5.1)
Sodium: 139 meq/L (ref 135–145)
Total Bilirubin: 0.5 mg/dL (ref 0.2–1.2)
Total Protein: 7.1 g/dL (ref 6.0–8.3)

## 2023-11-19 NOTE — Addendum Note (Signed)
 Addended by: Thressa Flora D on: 11/19/2023 08:14 AM   Modules accepted: Orders

## 2023-11-20 LAB — VITAMIN B12: Vitamin B-12: 887 pg/mL (ref 200–1100)

## 2023-11-20 NOTE — Addendum Note (Signed)
 Addended by: Raziel Koenigs M on: 11/20/2023 04:29 PM   Modules accepted: Orders

## 2023-11-23 ENCOUNTER — Ambulatory Visit: Payer: Self-pay | Admitting: Family Medicine

## 2023-11-23 ENCOUNTER — Encounter: Payer: Self-pay | Admitting: Family Medicine

## 2023-11-23 ENCOUNTER — Ambulatory Visit (INDEPENDENT_AMBULATORY_CARE_PROVIDER_SITE_OTHER): Admitting: Family Medicine

## 2023-11-23 DIAGNOSIS — E039 Hypothyroidism, unspecified: Secondary | ICD-10-CM | POA: Diagnosis not present

## 2023-11-23 DIAGNOSIS — I10 Essential (primary) hypertension: Secondary | ICD-10-CM | POA: Diagnosis not present

## 2023-11-23 MED ORDER — LOSARTAN POTASSIUM 50 MG PO TABS: 50.0000 mg | ORAL_TABLET | Freq: Every evening | ORAL | Status: DC

## 2023-11-23 NOTE — Patient Instructions (Addendum)
 It was a pleasure meeting you today. Thank you for allowing me to take part in your health care.  Our goals for today as we discussed include:  Recheck your thyroid  level today.  Will notify you if needing to make any changes  Blood pressure slightly elevated. Remains elevated today. Increase Losartan  to 50 mg daily Monitor blood pressure at home.  If remains elevated notify MD.  If <90/60 notify MD    This is a list of the screening recommended for you and due dates:  Health Maintenance  Topic Date Due   COVID-19 Vaccine (8 - 2024-25 season) 08/12/2023   Flu Shot  01/08/2024   Mammogram  08/10/2024   Medicare Annual Wellness Visit  10/11/2024   Pneumococcal Vaccine for age over 80  Completed   DEXA scan (bone density measurement)  Completed   Hepatitis C Screening  Completed   Zoster (Shingles) Vaccine  Completed   HPV Vaccine  Aged Out   Meningitis B Vaccine  Aged Out   DTaP/Tdap/Td vaccine  Discontinued   Colon Cancer Screening  Discontinued      If you have any questions or concerns, please do not hesitate to call the office at [phone].  I look forward to our next visit and until then take care and stay safe.  Regards,   Valli Gaw, MD   [facility]

## 2023-11-23 NOTE — Progress Notes (Unsigned)
 SUBJECTIVE:   Chief Complaint  Patient presents with   Hypertension    6 month follow up   HPI Presents to clinic for chronic disease management  Discussed the use of AI scribe software for clinical note transcription with the patient, who gave verbal consent to proceed.  History of Present Illness Lynn Taylor is a 78 year old female with hypothyroidism who presents for a follow-up to check her thyroid  levels.  Her thyroid  levels were previously noted to be significantly elevated, with a TSH level of 26. Consequently, her levothyroxine  dosage was increased from 100 micrograms to 125 micrograms daily. She takes the medication every morning on an empty stomach and waits at least two hours before eating. No weight gain, heart palpitations, chest pain, hoarseness, difficulty swallowing, constipation, fatigue, or diarrhea. She notes that when she takes her thyroid  medication, it causes her to have a bowel movement shortly after.  Her blood pressure at home is generally around 134, occasionally reaching 150, but not frequently. She is currently on a small dose of losartan  (Cozaar ) and takes it at bedtime. She mentions that her blood pressure has been affected by the stress of her air conditioning system being out for a week, which has been a source of discomfort for her husband due to his sensitivity to heat. No visual problems.  She mentions having an ear infection that started after a trip to [place] , where she experienced significant ear pressure while driving through hilly areas. She describes the pain as severe enough to feel like her eardrum might burst.  She lives with her husband in a small three-bedroom, one-bathroom house. Her children and grandchildren are assisting with the repair of their air conditioning system.    PERTINENT PMH / PSH: As above  OBJECTIVE:  BP (!) 154/71   Pulse (!) 58   Temp 98.1 F (36.7 C)   Resp 20   Ht 5' 3 (1.6 m)   Wt 141 lb 6 oz (64.1 kg)    SpO2 99%   BMI 25.04 kg/m    Physical Exam Vitals reviewed.  Constitutional:      General: She is not in acute distress.    Appearance: Normal appearance. She is not ill-appearing, toxic-appearing or diaphoretic.   Eyes:     General:        Right eye: No discharge.        Left eye: No discharge.     Conjunctiva/sclera: Conjunctivae normal.    Cardiovascular:     Rate and Rhythm: Normal rate and regular rhythm.     Heart sounds: Normal heart sounds.  Pulmonary:     Effort: Pulmonary effort is normal.     Breath sounds: Normal breath sounds.  Abdominal:     General: Bowel sounds are normal.   Musculoskeletal:        General: Normal range of motion.   Skin:    General: Skin is warm and dry.   Neurological:     General: No focal deficit present.     Mental Status: She is alert and oriented to person, place, and time. Mental status is at baseline.   Psychiatric:        Mood and Affect: Mood normal.        Behavior: Behavior normal.        Thought Content: Thought content normal.        Judgment: Judgment normal.           11/03/2023  8:17 AM 10/12/2023    3:48 PM 07/22/2023    2:02 PM 05/28/2023    8:40 AM 09/18/2022    3:36 PM  Depression screen PHQ 2/9  Decreased Interest 0 0 0 0 0  Down, Depressed, Hopeless 0 0 0 0 0  PHQ - 2 Score 0 0 0 0 0  Altered sleeping 0 0 0 0   Tired, decreased energy 0 0 0 0   Change in appetite 0 0 0 0   Feeling bad or failure about yourself  0 0 0 0   Trouble concentrating 0 0 0 0   Moving slowly or fidgety/restless 0 0 0 0   Suicidal thoughts 0 0 0 0   PHQ-9 Score 0 0 0 0   Difficult doing work/chores Not difficult at all Not difficult at all Not difficult at all Not difficult at all       11/03/2023    8:17 AM 07/22/2023    2:02 PM 05/28/2023    8:40 AM 05/21/2022   10:15 AM  GAD 7 : Generalized Anxiety Score  Nervous, Anxious, on Edge 0 0 0 0  Control/stop worrying 0 0 0 0  Worry too much - different things 0 0 0 0   Trouble relaxing 0 0 0 0  Restless 0 0 0 0  Easily annoyed or irritable 0 0 0 0  Afraid - awful might happen 0 0 0 0  Total GAD 7 Score 0 0 0 0  Anxiety Difficulty Not difficult at all Not difficult at all Not difficult at all Not difficult at all    ASSESSMENT/PLAN:  Acquired hypothyroidism Assessment & Plan: TSH previously elevated at 26. Levothyroxine  increased to 125 mcg. Re-evaluation of TSH needed for further adjustments. - Order TSH test today. - Adjust levothyroxine  dosage based on TSH results. - Ensure she continues taking levothyroxine  on an empty stomach, waiting at least 30 minutes before eating.  Orders: -     TSH -     Levothyroxine  Sodium; Take 1 tablet (125 mcg total) by mouth daily.  Dispense: 90 tablet; Refill: 3  Essential hypertension Assessment & Plan: Blood pressure elevated at 151 systolic in office, home readings around 134-150. Low dose losartan . Thyroid  condition may affect control. - Recheck blood pressure in office. - Increase losartan  from 25 mg to 50 mg daily - Instruct her to monitor blood pressure at home. - Adjust losartan  dosage based on follow-up blood pressure readings.  Orders: -     Losartan  Potassium; Take 1 tablet (50 mg total) by mouth at bedtime.    PDMP reviewed  Return for PCP, HTN.  Valli Gaw, MD

## 2023-11-24 ENCOUNTER — Encounter: Payer: Self-pay | Admitting: Oncology

## 2023-11-24 ENCOUNTER — Telehealth: Payer: Self-pay | Admitting: *Deleted

## 2023-11-24 LAB — TSH: TSH: 3.29 u[IU]/mL (ref 0.450–4.500)

## 2023-11-24 NOTE — Telephone Encounter (Signed)
 The patient she was told from the PCP to call over to Dr. Jackqueline Mason team because her hemoglobin is dropping.  Is a note from Tanya Walsh-Follow up with Oncology for low hemoglobin.

## 2023-11-25 ENCOUNTER — Encounter: Payer: Self-pay | Admitting: Oncology

## 2023-11-25 ENCOUNTER — Encounter: Payer: Self-pay | Admitting: Family Medicine

## 2023-11-25 NOTE — Telephone Encounter (Signed)
 Dr. Wilhelmenia Harada has reviewed labs and hemoglobin is stable, better than for months ago. No need for intervention at the moment. Spoke to pt and informed her of this. Pt verbalized understanding.

## 2023-11-26 ENCOUNTER — Other Ambulatory Visit: Payer: Self-pay | Admitting: Family Medicine

## 2023-11-26 ENCOUNTER — Encounter: Payer: Self-pay | Admitting: Family Medicine

## 2023-11-26 ENCOUNTER — Ambulatory Visit: Payer: Medicare Other | Admitting: Family Medicine

## 2023-11-26 ENCOUNTER — Ambulatory Visit: Payer: Self-pay | Admitting: Family Medicine

## 2023-11-26 DIAGNOSIS — I10 Essential (primary) hypertension: Secondary | ICD-10-CM

## 2023-11-26 MED ORDER — LOSARTAN POTASSIUM 50 MG PO TABS: 50.0000 mg | ORAL_TABLET | Freq: Every evening | ORAL | 3 refills | Status: DC

## 2023-11-26 MED ORDER — LEVOTHYROXINE SODIUM 125 MCG PO TABS: 125.0000 ug | ORAL_TABLET | Freq: Every day | ORAL | 3 refills | Status: DC

## 2023-11-26 NOTE — Assessment & Plan Note (Addendum)
 Blood pressure elevated at 151 systolic in office, home readings around 134-150. Low dose losartan . Thyroid  condition may affect control. - Recheck blood pressure in office. - Increase losartan  from 25 mg to 50 mg daily - Instruct her to monitor blood pressure at home. - Adjust losartan  dosage based on follow-up blood pressure readings.

## 2023-11-26 NOTE — Assessment & Plan Note (Signed)
 TSH previously elevated at 26. Levothyroxine  increased to 125 mcg. Re-evaluation of TSH needed for further adjustments. - Order TSH test today. - Adjust levothyroxine  dosage based on TSH results. - Ensure she continues taking levothyroxine  on an empty stomach, waiting at least 30 minutes before eating.

## 2023-11-29 ENCOUNTER — Other Ambulatory Visit: Payer: Self-pay

## 2023-11-29 ENCOUNTER — Encounter: Payer: Self-pay | Admitting: Family Medicine

## 2023-11-29 ENCOUNTER — Emergency Department
Admission: EM | Admit: 2023-11-29 | Discharge: 2023-11-29 | Disposition: A | Discharge status: Home / Self Care | Attending: Emergency Medicine | Admitting: Emergency Medicine

## 2023-11-29 DIAGNOSIS — R55 Syncope and collapse: Secondary | ICD-10-CM | POA: Diagnosis not present

## 2023-11-29 DIAGNOSIS — Z743 Need for continuous supervision: Secondary | ICD-10-CM | POA: Diagnosis not present

## 2023-11-29 DIAGNOSIS — R110 Nausea: Secondary | ICD-10-CM | POA: Diagnosis not present

## 2023-11-29 DIAGNOSIS — I10 Essential (primary) hypertension: Secondary | ICD-10-CM | POA: Insufficient documentation

## 2023-11-29 DIAGNOSIS — R42 Dizziness and giddiness: Secondary | ICD-10-CM | POA: Diagnosis not present

## 2023-11-29 DIAGNOSIS — R6889 Other general symptoms and signs: Secondary | ICD-10-CM | POA: Diagnosis not present

## 2023-11-29 DIAGNOSIS — R404 Transient alteration of awareness: Secondary | ICD-10-CM | POA: Diagnosis not present

## 2023-11-29 LAB — CBC WITH DIFFERENTIAL/PLATELET
Abs Immature Granulocytes: 0.02 10*3/uL (ref 0.00–0.07)
Basophils Absolute: 0 10*3/uL (ref 0.0–0.1)
Basophils Relative: 0 %
Eosinophils Absolute: 0 10*3/uL (ref 0.0–0.5)
Eosinophils Relative: 0 %
HCT: 34.7 % — ABNORMAL LOW (ref 36.0–46.0)
Hemoglobin: 11.3 g/dL — ABNORMAL LOW (ref 12.0–15.0)
Immature Granulocytes: 0 %
Lymphocytes Relative: 18 %
Lymphs Abs: 1.2 10*3/uL (ref 0.7–4.0)
MCH: 28.4 pg (ref 26.0–34.0)
MCHC: 32.6 g/dL (ref 30.0–36.0)
MCV: 87.2 fL (ref 80.0–100.0)
Monocytes Absolute: 0.4 10*3/uL (ref 0.1–1.0)
Monocytes Relative: 5 %
Neutro Abs: 5.3 10*3/uL (ref 1.7–7.7)
Neutrophils Relative %: 77 %
Platelets: 177 10*3/uL (ref 150–400)
RBC: 3.98 MIL/uL (ref 3.87–5.11)
RDW: 15.6 % — ABNORMAL HIGH (ref 11.5–15.5)
WBC: 7 10*3/uL (ref 4.0–10.5)
nRBC: 0 % (ref 0.0–0.2)

## 2023-11-29 LAB — BASIC METABOLIC PANEL WITH GFR
Anion gap: 6 (ref 5–15)
BUN: 19 mg/dL (ref 8–23)
CO2: 28 mmol/L (ref 22–32)
Calcium: 9.5 mg/dL (ref 8.9–10.3)
Chloride: 106 mmol/L (ref 98–111)
Creatinine, Ser: 0.83 mg/dL (ref 0.44–1.00)
GFR, Estimated: >60 mL/min (ref >60)
Glucose, Bld: 129 mg/dL — ABNORMAL HIGH (ref 70–99)
Potassium: 4.1 mmol/L (ref 3.5–5.1)
Sodium: 140 mmol/L (ref 135–145)

## 2023-11-29 LAB — TROPONIN I (HIGH SENSITIVITY): Troponin I (High Sensitivity): 5 ng/L (ref <18)

## 2023-11-29 NOTE — ED Triage Notes (Signed)
 Pt reports feeling dizzy, weak, and hot and passed out.  Pt denies striking her head, denies any injuries.

## 2023-11-29 NOTE — ED Provider Notes (Signed)
 [facility] Provider Note    Event Date/Time   First MD Initiated Contact with Patient 11/29/23 1529     (approximate)   History   Weakness   HPI Lynn Taylor is a 78 y.o. female with history of HTN, anemia presenting today for syncope.  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  She then passed out which was witnessed by people around her.  No reported head injury.  She states it lasted a couple seconds and was back to her baseline.  Denies any chest pain, palpitations, vomiting, shortness of breath.  States her home blood pressure losartan  was recently doubled in the past week.  EMS reported systolic blood pressure was 100 with them.  Currently she denies any symptoms and is feeling well.     Physical Exam   Triage Vital Signs: ED Triage Vitals  Encounter Vitals Group     BP      Girls Systolic BP Percentile      Girls Diastolic BP Percentile      Boys Systolic BP Percentile      Boys Diastolic BP Percentile      Pulse      Resp      Temp      Temp src      SpO2      Weight      Height      Head Circumference      Peak Flow      Pain Score      Pain Loc      Pain Education      Exclude from Growth Chart     Most recent vital signs: Vitals:   11/29/23 1600  BP: (!) 151/80  Pulse: 69  Resp: 16  SpO2: 100%    Physical Exam: I have reviewed the vital signs and nursing notes. General: Awake, alert, no acute distress.  Nontoxic appearing. Head:  Atraumatic, normocephalic.   ENT:  EOM intact, PERRL. Oral mucosa is pink and moist with no lesions. Neck: Neck is supple with full range of motion, No meningeal signs. Cardiovascular:  RRR, No murmurs. Peripheral pulses palpable and equal bilaterally. Respiratory:  Symmetrical chest wall expansion.  No rhonchi, rales, or wheezes.  Good air movement throughout.  No use of accessory muscles.    Musculoskeletal:  No cyanosis or edema. Moving extremities with full ROM Abdomen:  Soft, nontender, nondistended. Neuro:  GCS 15, moving all four extremities, interacting appropriately. Speech clear. Psych:  Calm, appropriate.   Skin:  Warm, dry, no rash.    ED Results / Procedures / Treatments   Labs (all labs ordered are listed, but only abnormal results are displayed) Labs Reviewed  CBC WITH DIFFERENTIAL/PLATELET - Abnormal; Notable for the following components:      Result Value   Hemoglobin 11.3 (*)    HCT 34.7 (*)    RDW 15.6 (*)    All other components within normal limits  BASIC METABOLIC PANEL WITH GFR - Abnormal; Notable for the following components:   Glucose, Bld 129 (*)    All other components within normal limits  TROPONIN I (HIGH SENSITIVITY)     EKG My EKG interpretation: Rate of 66, normal sinus rhythm, normal axis, normal intervals.  No acute ST elevations or depressions   RADIOLOGY    PROCEDURES:  Critical Care performed: No  Procedures   MEDICATIONS ORDERED IN  ED: Medications - No data to display   IMPRESSION / MDM / ASSESSMENT AND PLAN / ED COURSE  I reviewed the triage vital signs and the nursing notes.                              Differential diagnosis includes, but is not limited to, vasovagal syncope, orthostatic hypotension, medication induced hypotension, dehydration, lower suspicion for cardiac arrhythmia or electrolyte abnormality  Patient's presentation is most consistent with acute complicated illness / injury requiring diagnostic workup.  Patient is a 78 year old female presenting today for episode of lightheadedness, nausea, and sweating preceding a syncopal episode.  No head or neck injury.  Symptoms are very typical of a vasovagal syncope but also has recent increase in her losartan  which may have triggered today's episode as well.  On arrival here blood pressure is 136 systolic and she is asymptomatic.  Physical exam  unremarkable.  EKG with no ischemic findings or other arrhythmias.  Laboratory workup with unremarkable CBC, BMP, troponin.  Patient is a low risk criteria for [facility] syncope scoring and most likely explanation is either vasovagal syncope or hypotension secondary to medication.  Otherwise asymptomatic at all times here and safer discharge and follow-up with her PCP.  Given strict return precautions.  The patient is on the cardiac monitor to evaluate for evidence of arrhythmia and/or significant heart rate changes. Clinical Course as of 11/29/23 1722  Sun Nov 29, 2023  1721 Asymptomatic and ambulating without issue. [DW]    Clinical Course User Index [DW] Malvina Alm DASEN, MD     FINAL CLINICAL IMPRESSION(S) / ED DIAGNOSES   Final diagnoses:  Syncope and collapse     Rx / DC Orders   ED Discharge Orders     None        Note:  This document was prepared using Dragon voice recognition software and may include unintentional dictation errors.   Malvina Alm DASEN, MD 11/29/23 [id number]

## 2023-11-29 NOTE — Discharge Instructions (Signed)
 I suspect this was likely a vasovagal syncope or related to your blood pressure going low.  Please follow-up with your primary care provider for reassessment in the next week.  Please return for any severe or worsening symptoms.

## 2023-12-01 ENCOUNTER — Encounter: Payer: Self-pay | Admitting: Family

## 2023-12-01 ENCOUNTER — Ambulatory Visit (INDEPENDENT_AMBULATORY_CARE_PROVIDER_SITE_OTHER): Admitting: Family

## 2023-12-01 ENCOUNTER — Ambulatory Visit: Attending: Family

## 2023-12-01 VITALS — BP 138/76 | HR 70 | Temp 98.0°F | Resp 16 | Ht 63.0 in | Wt 139.6 lb

## 2023-12-01 DIAGNOSIS — R55 Syncope and collapse: Secondary | ICD-10-CM | POA: Diagnosis not present

## 2023-12-01 DIAGNOSIS — I10 Essential (primary) hypertension: Secondary | ICD-10-CM

## 2023-12-01 DIAGNOSIS — D649 Anemia, unspecified: Secondary | ICD-10-CM | POA: Diagnosis not present

## 2023-12-01 LAB — URINALYSIS, ROUTINE W REFLEX MICROSCOPIC
Bilirubin Urine: NEGATIVE
Hgb urine dipstick: NEGATIVE
Ketones, ur: NEGATIVE
Leukocytes,Ua: NEGATIVE
Nitrite: NEGATIVE
RBC / HPF: NONE SEEN (ref 0–?)
Specific Gravity, Urine: >=1.03 — AB (ref 1.000–1.030)
Urine Glucose: NEGATIVE
Urobilinogen, UA: 0.2 (ref 0.0–1.0)
pH: 6 (ref 5.0–8.0)

## 2023-12-01 MED ORDER — LOSARTAN POTASSIUM 50 MG PO TABS: 25.0000 mg | ORAL_TABLET | Freq: Every evening | ORAL | Status: DC

## 2023-12-01 NOTE — Assessment & Plan Note (Signed)
 No recurrence. Reviewed ED visit. Discussed vasovagal syncope.  She is not orthostatic on exam.  Pending us  carotid, zio monitor.  Continue losartan  25mg  daily for now, decreased from 50mg . Close follow up.

## 2023-12-01 NOTE — Progress Notes (Unsigned)
 Assessment & Plan:  There are no diagnoses linked to this encounter.   Return precautions given.   Risks, benefits, and alternatives of the medications and treatment plan prescribed today were discussed, and patient expressed understanding.   Education regarding symptom management and diagnosis given to patient on AVS either electronically or printed.  No follow-ups on file.  Rollene Northern, FNP  Subjective:    Patient ID: Lynn Taylor, female    DOB: 1946/01/21, 78 y.o.   MRN: [id number]  CC: Lynn Taylor is a 78 y.o. female who presents today for follow up.   HPI: Accompanied by husband, Jonette Gartner well today No recurrence of syncope She is taking losartan  25mg  every day   She reports that she got up from a chair . While sitting in the chair 'started to feel bad'. She started to feel nauseated.  She took a couple of steps and feels she had sycope.  No loss of urine of bowel  She quickly woke up and realized she was on the floor. No head injury.  No cp, numbness in left arm.    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   Colonoscopy 11/2016 non bleeding internal hemorrhoids  H/o pancreatitic cancer; s/p whipple 2019 Continues to follow with [facility] , f/u scheduled 12/21/23 Allergies: Other Current Outpatient Medications on File Prior to Visit  Medication Sig Dispense Refill   Amylase-Lipase-Protease (PANCREASE PO) Take by mouth. Takes 6 daily OTC     ascorbic acid (VITAMIN C) 500 MG tablet      aspirin  (ASPIRIN  81) 81 MG EC tablet Take 2 tablets (162 mg total) by mouth daily. Swallow whole. 180 tablet 1   Cholecalciferol  (VITAMIN D ) 50 MCG (2000 UT) CAPS Take by mouth daily.     fluticasone   (FLONASE ) 50 MCG/ACT nasal spray Place 2 sprays into both nostrils daily. After nasal saline 48 g 3   levothyroxine  (SYNTHROID ) 125 MCG tablet Take 1 tablet (125 mcg total) by mouth daily. 90 tablet 3   losartan  (COZAAR ) 50 MG tablet Take 1 tablet (50 mg total) by mouth at bedtime. (Patient taking differently: Take 25 mg by mouth at bedtime.) 90 tablet 3   Magnesium  Cl-Calcium  Carbonate (SLOW-MAG PO) Take by mouth. 2 QD     Multiple Vitamins-Minerals (CENTRUM SILVER 50+WOMEN PO) Take by mouth.     TURMERIC CURCUMIN PO Take by mouth 3 (three) times daily with meals.     No current facility-administered medications on file prior to visit.    Review of Systems  Constitutional:  Negative for chills and fever.  Respiratory:  Negative for cough and shortness of breath.   Cardiovascular:  Negative for chest pain, palpitations and leg swelling.  Gastrointestinal:  Negative for nausea and vomiting.  Neurological:  Positive for syncope. Negative for dizziness and headaches.      Objective:    BP 138/76   Pulse 70   Temp 98 F (36.7 C)   Resp 16   Ht 5' 3 (1.6 m)   Wt 139 lb 9.6 oz (63.3 kg)   SpO2 98%   BMI 24.73 kg/m  BP Readings from Last 3 Encounters:  12/01/23 138/76  11/29/23 (!) 151/80  11/23/23 (!) 154/71   [employer]  Readings from Last 3 Encounters:  12/01/23 139 lb 9.6 oz (63.3 kg)  11/29/23 141 lb (64 kg)  11/23/23 141 lb 6 oz (64.1 kg)    Physical Exam Vitals reviewed.  Constitutional:      Appearance: She is well-developed.   Eyes:     Conjunctiva/sclera: Conjunctivae normal.   Neck:     Vascular: No carotid bruit.   Cardiovascular:     Rate and Rhythm: Normal rate and regular rhythm.     Pulses: Normal pulses.     Heart sounds: Normal heart sounds.  Pulmonary:     Effort: Pulmonary effort is normal.     Breath sounds: Normal breath sounds. No wheezing, rhonchi or rales.   Musculoskeletal:     Right lower leg: No edema.     Left lower leg: No edema.    Skin:    General: Skin is warm and dry.   Neurological:     Mental Status: She is alert.   Psychiatric:        Speech: Speech normal.        Behavior: Behavior normal.        Thought Content: Thought content normal.

## 2023-12-01 NOTE — Patient Instructions (Signed)
 Careful with position changes and drink plenty of water  I have ordered ultrasound of carotid  Let us  know if you dont hear back within a week in regards to an appointment being scheduled.   So that you are aware, if you are [facility] MyChart user , please pay attention to your MyChart messages as you may receive a MyChart message with a phone number to call and schedule this test/appointment own your own from our referral coordinator. This is a new process so I do not want you to miss this message.  If you are not a MyChart user, you will receive a phone call.   I have ordered a 14 day Zio monitor which will mailed directly to you. The device will include instructions on how to apply.   The Zio 14 day monitor that we use DOES NOT have 24 hour live monitoring. The rhythm of your heart will not be monitored while you are wearing it. Only AFTER you mail the device back in and a cardiologist interprets the data will we know if an underlying cardiac arrhythmia is going on.   There are models that offer 24 hour live monitoring however NOT this one. I wanted to be sure that you were aware of this as certainly didn't want this to be a false sense of security.   If you experience chest pain, shortness of breath, left arm numbness, or sustained, more frequent palpitations , do not wait and call 911 right away. We are in a delicate period of work up regarding palpitations and until we are sure that you do not have an underlying arrhythmia, you must be extremely vigilant and cautious.      ZIO XT- Long Term Monitor Instructions   Your provider has requested you wear a ZIO patch monitor for 14 days.  This is a single patch monitor. [employer] supplies one patch monitor per enrollment. Additional stickers are not available. Please do not apply patch if you will be having a Nuclear Stress Test,  Echocardiogram, Cardiac CT, MRI, or Chest Xray during the period you would be wearing the  monitor. The patch cannot be  worn during these tests. You cannot remove and re-apply the  ZIO XT patch monitor.  Your ZIO patch monitor will be mailed 3 day USPS to your address on file. It may take 3-5 days  to receive your monitor after you have been enrolled.  Once you have received your monitor, please review the enclosed instructions. Your monitor  has already been registered assigning a specific monitor serial # to you.   Billing and Patient Assistance Program Information   We have supplied [employer] with any of your insurance information on file for billing purposes. [employer] offers a sliding scale Patient Assistance Program for patients that do not have  insurance, or whose insurance does not completely cover the cost of the ZIO monitor.  You must apply for the Patient Assistance Program to qualify for this discounted rate.  To apply, please call [employer] at [phone], select option 4, select option 2, ask to apply for  Patient Assistance Program. M[employer] will ask your household income, and how many people  are in your household. They will quote your out-of-pocket cost based on that information.  [employer] will also be able to set up a 48-month, interest-free payment plan if needed.   Applying the monitor   Shave hair from upper left chest.  Hold abrader disc by orange tab. Rub abrader in 40 strokes over the  upper left chest as  indicated in your monitor instructions.  Clean area with 4 enclosed alcohol pads. Let dry.  Apply patch as indicated in monitor instructions. Patch will be placed under collarbone on left  side of chest with arrow pointing upward.  Rub patch adhesive wings for 2 minutes. Remove white label marked 1. Remove the white  label marked 2. Rub patch adhesive wings for 2 additional minutes.  While looking in a mirror, press and release button in center of patch. A small green light will  flash 3-4 times. This will be your only indicator that the monitor has been turned on.  Do not shower  for the first 24 hours. You may shower after the first 24 hours.  Press the button if you feel a symptom. You will hear a small click. Record Date, Time and  Symptom in the Patient Logbook.  When you are ready to remove the patch, follow instructions on the last 2 pages of Patient  Logbook. Stick patch monitor onto the last page of Patient Logbook.  Place Patient Logbook in the blue and white box. Use locking tab on box and tape box closed  securely. The blue and white box has prepaid postage on it. Please place it in the mailbox as  soon as possible. Your physician should have your test results approximately 7 days after the  monitor has been mailed back to [facility].  Call [facility] Customer Care at [phone] if you have questions regarding  your ZIO XT patch monitor. Call them immediately if you see an orange light blinking on your  monitor.  If your monitor falls off in less than 4 days, contact our Monitor department at [phone].  If your monitor becomes loose or falls off after 4 days call [employer] at [phone] for  suggestions on securing your monitor   Dizziness Dizziness is a common problem. It makes you feel unsteady or light-headed. You may feel like you're about to faint. Dizziness can lead to getting hurt if you stumble or fall. It's more common to feel dizzy if you're an older adult. Many things can cause you to feel dizzy. These include: Medicines. Dehydration. This is when there's not enough water in your body. Illness. Follow these instructions at home: Eating and drinking  Drink enough fluid to keep your pee (urine) pale yellow. This helps keep you from getting dehydrated. Try to drink more clear fluids, such as water. Do not drink alcohol. Try to limit how much caffeine you take in. Try to limit how much salt, also called sodium, you take in. Activity Try not to make quick movements. Stand up slowly from sitting in a chair. Steady yourself  until you feel okay. In the morning, first sit up on the side of the bed. When you feel okay, hold onto something and slowly stand up. Do this until you know that your balance is okay. If you need to stand in one place for a long time, move your legs often. Tighten and relax the muscles in your legs while you're standing. Do not drive or use machines if you feel dizzy. Avoid bending down if you feel dizzy. Place items in your home so you can reach them without leaning over. Lifestyle Do not smoke, vape, or use products with nicotine or tobacco in them. If you need help quitting, talk with your health care provider. Try to lower your stress level. You can do this by using methods like yoga or meditation. Talk with  your provider if you need help. General instructions Watch your dizziness for any changes. Take your medicines only as told by your provider. Talk with your provider if you think you're dizzy because of a medicine you're taking. Tell a friend or a family member that you're feeling dizzy. If they spot any changes in your behavior, have them call your provider. Contact a health care provider if: Your dizziness doesn't go away, or you have new symptoms. Your dizziness gets worse. You feel like you may vomit. You have trouble hearing. You have a fever. You have neck pain or a stiff neck. You fall or get hurt. Get help right away if: You vomit each time you eat or drink. You have watery poop and can't eat or drink. You have trouble talking, walking, swallowing, or using your arms, hands, or legs. You feel very weak. You're bleeding. You're not thinking clearly, or you have trouble forming sentences. A friend or family member may spot this. Your vision changes, or you get a very bad headache. These symptoms may be an emergency. Call 911 right away. Do not wait to see if the symptoms will go away. Do not drive yourself to the hospital. This information is not intended to replace  advice given to you by your health care provider. Make sure you discuss any questions you have with your health care provider. Document Revised: 02/26/2023 Document Reviewed: 07/10/2022 Elsevier Patient Education  2024 [employer].

## 2023-12-02 ENCOUNTER — Ambulatory Visit

## 2023-12-02 ENCOUNTER — Telehealth: Payer: Self-pay | Admitting: Family

## 2023-12-02 DIAGNOSIS — R8281 Pyuria: Secondary | ICD-10-CM | POA: Diagnosis not present

## 2023-12-02 DIAGNOSIS — R899 Unspecified abnormal finding in specimens from other organs, systems and tissues: Secondary | ICD-10-CM

## 2023-12-02 LAB — IRON,TIBC AND FERRITIN PANEL
%SAT: 15 % — ABNORMAL LOW (ref 16–45)
Ferritin: 10 ng/mL — ABNORMAL LOW (ref 16–288)
Iron: 69 ug/dL (ref 45–160)
TIBC: 454 ug/dL — ABNORMAL HIGH (ref 250–450)

## 2023-12-02 NOTE — Telephone Encounter (Signed)
 Can we add on urine culture to urine yesterday?

## 2023-12-03 LAB — URINE CULTURE
MICRO NUMBER:: 16623555
Result:: NO GROWTH
SPECIMEN QUALITY:: ADEQUATE

## 2023-12-07 ENCOUNTER — Encounter: Payer: Self-pay | Admitting: Family

## 2023-12-07 ENCOUNTER — Ambulatory Visit: Payer: Self-pay | Admitting: Family

## 2023-12-17 ENCOUNTER — Ambulatory Visit: Payer: Self-pay | Admitting: Family

## 2023-12-17 DIAGNOSIS — D649 Anemia, unspecified: Secondary | ICD-10-CM

## 2023-12-21 ENCOUNTER — Encounter: Payer: Self-pay | Admitting: Family

## 2023-12-21 DIAGNOSIS — Z9049 Acquired absence of other specified parts of digestive tract: Secondary | ICD-10-CM | POA: Diagnosis not present

## 2023-12-21 DIAGNOSIS — R932 Abnormal findings on diagnostic imaging of liver and biliary tract: Secondary | ICD-10-CM | POA: Diagnosis not present

## 2023-12-21 DIAGNOSIS — C250 Malignant neoplasm of head of pancreas: Secondary | ICD-10-CM | POA: Diagnosis not present

## 2023-12-22 ENCOUNTER — Other Ambulatory Visit: Payer: Self-pay | Admitting: Family

## 2023-12-22 NOTE — Telephone Encounter (Signed)
 Spoke to pt she does have instructions does not understand how to place. Lynn Taylor stated that we will need an order for it to be placed in order to schedule nurse visit for placement

## 2023-12-23 NOTE — Telephone Encounter (Signed)
 See pt chart for order for placement

## 2023-12-23 NOTE — Telephone Encounter (Signed)
 Spoke to pt scheduled appt for 12/25/23

## 2023-12-24 DIAGNOSIS — C250 Malignant neoplasm of head of pancreas: Secondary | ICD-10-CM | POA: Diagnosis not present

## 2023-12-24 DIAGNOSIS — K769 Liver disease, unspecified: Secondary | ICD-10-CM | POA: Diagnosis not present

## 2023-12-24 DIAGNOSIS — K7689 Other specified diseases of liver: Secondary | ICD-10-CM | POA: Diagnosis not present

## 2023-12-24 DIAGNOSIS — C259 Malignant neoplasm of pancreas, unspecified: Secondary | ICD-10-CM | POA: Diagnosis not present

## 2023-12-25 ENCOUNTER — Ambulatory Visit

## 2023-12-29 ENCOUNTER — Inpatient Hospital Stay: Payer: Medicare Other | Attending: Oncology

## 2023-12-29 DIAGNOSIS — Z95828 Presence of other vascular implants and grafts: Secondary | ICD-10-CM

## 2023-12-29 DIAGNOSIS — Z8507 Personal history of malignant neoplasm of pancreas: Secondary | ICD-10-CM | POA: Insufficient documentation

## 2023-12-29 MED ORDER — HEPARIN SOD (PORK) LOCK FLUSH 100 UNIT/ML IV SOLN
500.0000 [IU] | Freq: Once | INTRAVENOUS | Status: AC
  Administered 2023-12-29: 500 [IU] via INTRAVENOUS
  Filled 2023-12-29: qty 5

## 2023-12-29 MED ORDER — SODIUM CHLORIDE 0.9% FLUSH
10.0000 mL | Freq: Once | INTRAVENOUS | Status: AC
  Administered 2023-12-29: 10 mL via INTRAVENOUS
  Filled 2023-12-29: qty 10

## 2024-01-04 DIAGNOSIS — Z8507 Personal history of malignant neoplasm of pancreas: Secondary | ICD-10-CM | POA: Diagnosis not present

## 2024-01-04 DIAGNOSIS — K769 Liver disease, unspecified: Secondary | ICD-10-CM | POA: Diagnosis not present

## 2024-01-04 DIAGNOSIS — C250 Malignant neoplasm of head of pancreas: Secondary | ICD-10-CM | POA: Diagnosis not present

## 2024-01-25 DIAGNOSIS — R970 Elevated carcinoembryonic antigen [CEA]: Secondary | ICD-10-CM | POA: Diagnosis not present

## 2024-01-25 DIAGNOSIS — K769 Liver disease, unspecified: Secondary | ICD-10-CM | POA: Diagnosis not present

## 2024-01-25 DIAGNOSIS — Z90411 Acquired partial absence of pancreas: Secondary | ICD-10-CM | POA: Diagnosis not present

## 2024-01-25 DIAGNOSIS — C250 Malignant neoplasm of head of pancreas: Secondary | ICD-10-CM | POA: Diagnosis not present

## 2024-02-01 ENCOUNTER — Ambulatory Visit: Admitting: Family

## 2024-02-15 ENCOUNTER — Encounter: Payer: Self-pay | Admitting: Family Medicine

## 2024-02-15 ENCOUNTER — Ambulatory Visit: Admitting: Family Medicine

## 2024-02-15 ENCOUNTER — Ambulatory Visit: Attending: Family Medicine

## 2024-02-15 VITALS — BP 148/62 | HR 67 | Temp 98.1°F | Ht 62.0 in | Wt 137.3 lb

## 2024-02-15 DIAGNOSIS — Z8507 Personal history of malignant neoplasm of pancreas: Secondary | ICD-10-CM

## 2024-02-15 DIAGNOSIS — R55 Syncope and collapse: Secondary | ICD-10-CM

## 2024-02-15 DIAGNOSIS — M5136 Other intervertebral disc degeneration, lumbar region with discogenic back pain only: Secondary | ICD-10-CM | POA: Diagnosis not present

## 2024-02-15 DIAGNOSIS — Z95828 Presence of other vascular implants and grafts: Secondary | ICD-10-CM

## 2024-02-15 DIAGNOSIS — R110 Nausea: Secondary | ICD-10-CM | POA: Diagnosis not present

## 2024-02-15 DIAGNOSIS — R7303 Prediabetes: Secondary | ICD-10-CM

## 2024-02-15 DIAGNOSIS — I10 Essential (primary) hypertension: Secondary | ICD-10-CM

## 2024-02-15 DIAGNOSIS — I700 Atherosclerosis of aorta: Secondary | ICD-10-CM | POA: Diagnosis not present

## 2024-02-15 DIAGNOSIS — K219 Gastro-esophageal reflux disease without esophagitis: Secondary | ICD-10-CM

## 2024-02-15 DIAGNOSIS — M1990 Unspecified osteoarthritis, unspecified site: Secondary | ICD-10-CM

## 2024-02-15 DIAGNOSIS — K449 Diaphragmatic hernia without obstruction or gangrene: Secondary | ICD-10-CM

## 2024-02-15 DIAGNOSIS — J439 Emphysema, unspecified: Secondary | ICD-10-CM | POA: Diagnosis not present

## 2024-02-15 DIAGNOSIS — K7689 Other specified diseases of liver: Secondary | ICD-10-CM

## 2024-02-15 DIAGNOSIS — G629 Polyneuropathy, unspecified: Secondary | ICD-10-CM

## 2024-02-15 DIAGNOSIS — E039 Hypothyroidism, unspecified: Secondary | ICD-10-CM

## 2024-02-15 DIAGNOSIS — E559 Vitamin D deficiency, unspecified: Secondary | ICD-10-CM | POA: Diagnosis not present

## 2024-02-15 DIAGNOSIS — J432 Centrilobular emphysema: Secondary | ICD-10-CM

## 2024-02-15 MED ORDER — ONDANSETRON 4 MG PO TBDP: 4.0000 mg | ORAL_TABLET | Freq: Three times a day (TID) | ORAL | 2 refills | Status: AC | PRN

## 2024-02-15 MED ORDER — PANTOPRAZOLE SODIUM 40 MG PO TBEC: 40.0000 mg | DELAYED_RELEASE_TABLET | Freq: Every day | ORAL | 3 refills | Status: DC

## 2024-02-15 MED ORDER — LOSARTAN POTASSIUM 50 MG PO TABS: 50.0000 mg | ORAL_TABLET | Freq: Every evening | ORAL | Status: DC

## 2024-02-15 NOTE — Progress Notes (Signed)
 New patient visit   Patient: Lynn Taylor   DOB: 07-02-1945   78 y.o. Female  MRN: [id number] Visit Date: 02/15/2024  Today's healthcare provider: Rockie Agent, MD   Chief Complaint  Patient presents with   Establish Care    Patient presents to establish care with new pcp. Patient reports that sometimes when she bends over she gets a feeling of nausea  Scheduled for colonoscopy in December at [facility]  UTD on other vaccines/ screenings, declines flu shot today    Subjective    Lynn Taylor is a 78 y.o. female who presents today as a new patient to establish care.   HPI     Establish Care    Additional comments: Patient presents to establish care with new pcp. Patient reports that sometimes when she bends over she gets a feeling of nausea  Scheduled for colonoscopy in December at [facility]  UTD on other vaccines/ screenings, declines flu shot today       Last edited by Cherry Chiquita HERO, CMA on 02/15/2024  2:43 PM.       Discussed the use of AI scribe software for clinical note transcription with the patient, who gave verbal consent to proceed.  History of Present Illness Lynn Taylor is a 78 year old female with pancreatic cancer and hypothyroidism who presents to transfer and establish care.  She experiences intermittent nausea and dizziness primarily when bending over for extended periods, occurring over the past couple of months. No headaches or dizziness when lying down or changing positions in bed. She had a recent episode of syncope while standing a couple of weeks ago, which led to an ambulance being called, but no treatment was administered.  She has a history of pancreatic cancer and underwent surgery involving the tip of her pancreas. She follows up regularly and is scheduled for a colonoscopy in December. Attempts to biopsy spots on her liver were unsuccessful as they were not found during the procedure. She is not currently on chemotherapy.  She has  hypothyroidism and takes Synthroid  125 micrograms daily. She also takes losartan  50 mg at night for blood pressure, which she reports can be variable. She takes a multivitamin, magnesium  with calcium , and Zyrtec 10 mg as needed. She previously took Creon  for pancreatic enzyme replacement but found it too expensive.  She has a history of atherosclerosis of the aorta and polyneuropathy affecting her toes. She reports lower back pain. She reports a decrease in height over time, losing about an inch. No chest pain or palpitations and has not seen a cardiologist.  She quit smoking in 1989 and maintains a BMI of 25. No issues with reflux or heartburn but experiences gas and takes simethicone as needed. She has a history of a small hiatal hernia and gastritis noted in past imaging.  She uses candy to help her sleep, stating that sugar helps her fall asleep.      Past Medical History:  Diagnosis Date   Anemia due to antineoplastic chemotherapy 11/25/2018   Arthritis of shoulder region, right 04/29/2019   Bacterial conjunctivitis of right eye 08/21/2021   Bilateral carpal tunnel syndrome 04/29/2019   Biliary tract obstruction    Cancer (HCC)    Decreased appetite 09/20/2018   Eye irritation 08/21/2021   Fitting and adjustment of gastrointestinal appliance and device    Genetic testing 08/31/2018   Negative genetic testing (no pathogenic variants identified) on the [facility] Multi-Cancer Panel. The Multi-Cancer Panel offered by [facility]  includes sequencing and/or deletion duplication testing of the following 84 genes: AIP, ALK, APC, ATM, AXIN2,BAP1,  BARD1, BLM, BMPR1A, BRCA1, BRCA2, BRIP1, CASR, CDC73, CDH1, CDK4, CDKN1B, CDKN1C, CDKN2A (p14ARF), CDKN2A (p16INK4a), CEBPA, CHEK2, CTNNA1, DICER1   GERD (gastroesophageal reflux disease)    Goals of care, counseling/discussion 05/08/2018   Hand arthropathy 06/14/2019   Herniated disc, cervical 04/29/2019   History of deep vein thrombosis (DVT) of lower  extremity 12/26/2021   Hyperbilirubinemia 04/28/2018   Hypertension    Hypomagnesemia 11/25/2018   Hypomagnesemia 11/25/2018   Hypothyroidism    Jaundice    Lumbar herniated disc 04/29/2019   Malignant neoplasm of head of pancreas (HCC) 06/08/2018   Malignant neoplasm of head of pancreas (HCC) 06/08/2018   Malignant neoplasm of head of pancreas (HCC) 06/08/2018   Personal history of chemotherapy 2019    Pancreatic cancer   Wears dentures    full upper and lower    Outpatient Medications Prior to Visit  Medication Sig   Amylase-Lipase-Protease (PANCREASE PO) Take by mouth. Takes 6 daily OTC   aspirin  (ASPIRIN  81) 81 MG EC tablet Take 2 tablets (162 mg total) by mouth daily. Swallow whole.   cetirizine (ZYRTEC) 10 MG tablet Take 10 mg by mouth daily.   Cholecalciferol  (VITAMIN D ) 50 MCG (2000 UT) CAPS Take by mouth daily.   fluticasone  (FLONASE ) 50 MCG/ACT nasal spray Place 2 sprays into both nostrils daily. After nasal saline   levothyroxine  (SYNTHROID ) 125 MCG tablet Take 1 tablet (125 mcg total) by mouth daily.   Magnesium  Cl-Calcium  Carbonate (SLOW-MAG PO) Take by mouth. 2 QD   Multiple Vitamins-Minerals (CENTRUM SILVER 50+WOMEN PO) Take by mouth.   simethicone (MYLICON) 125 MG chewable tablet Chew 125 mg by mouth every 6 (six) hours as needed for flatulence.   TURMERIC CURCUMIN PO Take by mouth 3 (three) times daily with meals.   [DISCONTINUED] losartan  (COZAAR ) 50 MG tablet Take 0.5 tablets (25 mg total) by mouth at bedtime.   [DISCONTINUED] ascorbic acid (VITAMIN C) 500 MG tablet    No facility-administered medications prior to visit.    Past Surgical History:  Procedure Laterality Date   CATARACT EXTRACTION W/PHACO Right 11/27/2021   Procedure: CATARACT EXTRACTION PHACO AND INTRAOCULAR LENS PLACEMENT (IOC) RIGHT VISION BLUE, OMIDRIA   7.91 00:48.1;  Surgeon: Enola Feliciano Hugger, MD;  Location: [facility];  Service: Ophthalmology;  Laterality: Right;   CATARACT  EXTRACTION W/PHACO Left 12/18/2021   Procedure: CATARACT EXTRACTION PHACO AND INTRAOCULAR LENS PLACEMENT (IOC) LEFT;  Surgeon: Enola Feliciano Hugger, MD;  Location: [facility];  Service: Ophthalmology;  Laterality: Left;  7.09 0:56.0   CHOLECYSTECTOMY     COLONOSCOPY N/A 12/02/2016   Procedure: COLONOSCOPY;  Surgeon: Gaylyn Gladis PENNER, MD;  Location: [facility];  Service: Endoscopy;  Laterality: N/A;   ERCP N/A 04/30/2018   Procedure: ENDOSCOPIC RETROGRADE CHOLANGIOPANCREATOGRAPHY (ERCP);  Surgeon: Jinny Carmine, MD;  Location: [facility];  Service: Endoscopy;  Laterality: N/A;   ERCP N/A 05/05/2018   Procedure: ENDOSCOPIC RETROGRADE CHOLANGIOPANCREATOGRAPHY (ERCP);  Surgeon: Jinny Carmine, MD;  Location: [facility] END[facility]COPY;  Service: Endoscopy;  Laterality: N/A;   PORTA CATH INSERTION N/A 06/28/2018   Procedure: PORTA CATH INSERTION;  Surgeon: Marea Selinda RAMAN, MD;  Location: [facility] INVASIVE CV LAB;  Service: Cardiovascular;  Laterality: N/A;   Family Status  Relation Name Status   Other  (Not Specified)   Mother  Deceased   Sister mat half (Not Specified)   Brother mat half (Not Specified)   Brother  mat half (Not Specified)   Father  Deceased   MGM  Deceased   MGF  Deceased   PGM  Deceased   PGF  Deceased   Sister mat half (Not Specified)   Neg Hx  (Not Specified)  No partnership data on file   Family History  Adopted: Yes  Problem Relation Age of Onset   Diabetes Mellitus I Other    Alcoholism Other    Hypertension Other    Hyperlipidemia Other    Coronary artery disease Other    Stroke Other    Osteoarthritis Other    Migraines Other    Heart Problems Mother    Stroke Sister    CAD Brother    Stroke Brother    Breast cancer Neg Hx    Social History   Socioeconomic History   Marital status: Married    Spouse name: Not on file   Number of children: Not on file   Years of education: Not on file   Highest education level: Associate degree: occupational,  Scientist, product/process development, or vocational program  Occupational History   Not on file  Tobacco Use   Smoking status: Former    Current packs/day: 0.00    Types: Cigarettes    Quit date: 04/14/1988    Years since quitting: 35.8   Smokeless tobacco: Never  Vaping Use   Vaping status: Never Used  Substance and Sexual Activity   Alcohol use: Yes    Comment: social drinker   Drug use: No   Sexual activity: Not Currently  Other Topics Concern   Not on file  Social History Narrative   Lives at home married ETTER Mood)   In order of [place] star   Social Drivers of Health   Financial Resource Strain: Low Risk  (11/22/2023)   Overall Financial Resource Strain (CARDIA)    Difficulty of Paying Living Expenses: Not hard at all  Food Insecurity: No Food Insecurity (11/22/2023)   Hunger Vital Sign    Worried About Running Out of Food in the Last Year: Never true    Ran Out of Food in the Last Year: Never true  Transportation Needs: No Transportation Needs (11/22/2023)   PRAPARE - Administrator, Civil Service (Medical): No    Lack of Transportation (Non-Medical): No  Physical Activity: Inactive (11/22/2023)   Exercise Vital Sign    Days of Exercise per Week: 0 days    Minutes of Exercise per Session: Not on file  Stress: No Stress Concern Present (11/22/2023)   [employer] of Occupational Health - Occupational Stress Questionnaire    Feeling of Stress: Not at all  Social Connections: Socially Integrated (11/22/2023)   Social Connection and Isolation Panel    Frequency of Communication with Friends and Family: More than three times a week    Frequency of Social Gatherings with Friends and Family: More than three times a week    Attends Religious Services: More than 4 times per year    Active Member of Clubs or Organizations: Yes    Attends Banker Meetings: More than 4 times per year    Marital Status: Married     Allergies  Allergen Reactions   Other     Hair dye  blisters     Immunization History  Administered Date(s) Administered   Fluad Quad(high Dose 65+) 05/18/2020, 02/18/2021, 03/12/2022   Influenza-Unspecified 05/29/2023   PFIZER(Purple Top)SARS-COV-2 Vaccination 07/21/2019, 08/11/2019, 03/12/2020, 09/14/2020, 06/17/2023   PNEUMOCOCCAL CONJUGATE-20 05/21/2022  Pfizer Covid-19 Vaccine Bivalent Booster 34yrs & up 04/05/2021, 03/20/2022   Pfizer(Comirnaty)Fall Seasonal Vaccine 12 years and older 04/15/2022   Pneumococcal Conjugate-13 05/17/2021   RSV,unspecified 05/22/2022   Respiratory Syncytial Virus Vaccine,Recomb Aduvanted(Arexvy) 05/22/2022   Tdap 05/29/2023   Zoster Recombinant(Shingrix ) 03/20/2022, 08/22/2022    Health Maintenance  Topic Date Due   COVID-19 Vaccine (8 - 2025-26 season) 03/02/2024 (Originally 02/08/2024)   Influenza Vaccine  09/06/2024 (Originally 01/08/2024)   MAMMOGRAM  08/10/2024   Medicare Annual Wellness (AWV)  10/11/2024   Pneumococcal Vaccine: 50+ Years  Completed   DEXA SCAN  Completed   Hepatitis C Screening  Completed   Zoster Vaccines- Shingrix   Completed   HPV VACCINES  Aged Out   Meningococcal B Vaccine  Aged Out   DTaP/Tdap/Td  Discontinued   Colonoscopy  Discontinued    Patient Care Team: Sharma Coyer, MD as PCP - General (Family Medicine) Maurie Rayfield BIRCH, RN as Registered Nurse Therisa Bi, MD as Consulting Physician (Gastroenterology) Maurie Rayfield BIRCH, RN as Oncology Nurse Navigator Babara Call, MD as Consulting Physician (Oncology) Barbaraann Ozell Birmingham, MD as Referring Physician (Oncology) Marea Selinda RAMAN, MD as Referring Physician (Vascular Surgery)  Review of Systems  Last CBC Lab Results  Component Value Date   WBC 7.0 11/29/2023   HGB 11.3 (L) 11/29/2023   HCT 34.7 (L) 11/29/2023   MCV 87.2 11/29/2023   MCH 28.4 11/29/2023   RDW 15.6 (H) 11/29/2023   PLT 177 11/29/2023   Last metabolic panel Lab Results  Component Value Date   GLUCOSE 129 (H) 11/29/2023   NA  140 11/29/2023   K 4.1 11/29/2023   CL 106 11/29/2023   CO2 28 11/29/2023   BUN 19 11/29/2023   CREATININE 0.83 11/29/2023   GFRNONAA >60 11/29/2023   CALCIUM  9.5 11/29/2023   PROT 7.1 11/19/2023   ALBUMIN 4.1 11/19/2023   LABGLOB 3.8 04/21/2018   AGRATIO 1.0 (L) 04/21/2018   BILITOT 0.5 11/19/2023   ALKPHOS 50 11/19/2023   AST 16 11/19/2023   ALT 12 11/19/2023   ANIONGAP 6 11/29/2023   Last lipids Lab Results  Component Value Date   CHOL 180 11/19/2023   HDL 77.40 11/19/2023   LDLCALC 91 11/19/2023   TRIG 60.0 11/19/2023   CHOLHDL 2 11/19/2023   Last hemoglobin A1c Lab Results  Component Value Date   HGBA1C 6.3 01/21/2023   Last thyroid  functions Lab Results  Component Value Date   TSH 3.290 11/23/2023   T4TOTAL 10.3 06/16/2018   Last vitamin D  Lab Results  Component Value Date   VD25OH 21.18 (L) 11/03/2023   Last vitamin B12 and Folate Lab Results  Component Value Date   VITAMINB12 887 11/19/2023   FOLATE >23.8 09/10/2022        Objective    BP (!) 148/62 (Cuff Size: Normal)   Pulse 67   Temp 98.1 F (36.7 C) (Oral)   Ht 5' 2 (1.575 m)   Wt 137 lb 4.8 oz (62.3 kg)   SpO2 100%   BMI 25.11 kg/m  BP Readings from Last 3 Encounters:  02/15/24 (!) 148/62  12/01/23 138/76  11/29/23 (!) 151/80   Wt Readings from Last 3 Encounters:  02/15/24 137 lb 4.8 oz (62.3 kg)  12/01/23 139 lb 9.6 oz (63.3 kg)  11/29/23 141 lb (64 kg)        Depression Screen    02/15/2024    2:48 PM 11/03/2023    8:17 AM 10/12/2023    3:48  PM 07/22/2023    2:02 PM  PHQ 2/9 Scores  PHQ - 2 Score 0 0 0 0  PHQ- 9 Score 1 0 0 0   No results found for any visits on 02/15/24.   Physical Exam Physical Exam VITALS: BP- 148/71 MEASUREMENTS: Height- 5'2, BMI- 25.0. GENERAL: Alert, well developed, no acute distress. CHEST: Clear to auscultation bilaterally, no wheezes or crackles. CARDIOVASCULAR: Gallop present, no murmurs. ABDOMEN: Normal bowel sounds. NEUROLOGICAL:  Cranial nerves grossly intact, negative Dix-Hallpike maneuver.      Assessment & Plan      Problem List Items Addressed This Visit     Acquired hypothyroidism - Primary   Hypothyroidism Well-managed on Synthroid  125 mcg daily. Thyroid  levels are within normal range. - continue Synthorid 125mcg daily       Atherosclerosis of aorta (HCC)   Relevant Medications   losartan  (COZAAR ) 50 MG tablet   DDD (degenerative disc disease), lumbar   Chronic  No current symptoms  Will monitor for intermittent low back pain       Emphysema lung (HCC)   Relevant Medications   cetirizine (ZYRTEC) 10 MG tablet   Essential hypertension   Essential hypertension Chronic condition  Blood pressure recorded at 148/71 mmHg, recheck BP 148/60. Previous adjustment to losartan  dosage to 50 mg at night. Home monitoring of blood pressure is recommended. - Instruct to monitor blood pressure at home once daily, at least an hour after taking losartan  - Re-evaluate blood pressure control at next visit - continue losartan  50mg  daily       Relevant Medications   losartan  (COZAAR ) 50 MG tablet   GERD (gastroesophageal reflux disease)   Chronic  Start Protonix  40mg  daily  Noted to have small hiatal hernia in 2010  F/u with GI as scheduled for colonoscopy in Dec 2025      Relevant Medications   simethicone (MYLICON) 125 MG chewable tablet   pantoprazole  (PROTONIX ) 40 MG tablet   ondansetron  (ZOFRAN -ODT) 4 MG disintegrating tablet   History of pancreatic cancer (Chronic)   Liver cyst   Noted during oncology evaluation  Follows with GI  F/u as scheduled       Osteoarthritis   Polyneuropathy (Chronic)   Chronic stable  Patient reports symptoms in her toes  Continues dietary supplements for these symptoms        Port-A-Cath in place (Chronic)   Prediabetes   Syncope and collapse   Relevant Orders   LONG TERM MONITOR XT (3-14 DAYS)   Vitamin D  deficiency   Vitamin D  deficiency Chronic  condition  Vitamin D  level previously low at 21.  - continu vitamin D3 2000 IU daily.      Other Visit Diagnoses       Hiatal hernia       Relevant Medications   pantoprazole  (PROTONIX ) 40 MG tablet     Nausea       Relevant Medications   ondansetron  (ZOFRAN -ODT) 4 MG disintegrating tablet       Assessment and Plan Assessment & Plan Syncope and dizziness, under evaluation Intermittent dizziness and syncope, particularly with positional changes. Recent syncope event occurred a few weeks ago. Differential includes cardiac arrhythmia or orthostatic changes. No current chest pain or palpitations. Previous heart monitor was not used due to application difficulty. No vertigo on examination. - Order Zio patch for heart monitoring for 14 days - Consider cardiology referral based on Zio patch results  Nausea with positional changes Chronic, intermittent  Nausea associated with bending over  or positional changes for a couple of months. No associated headaches or vertigo. Differential includes orthostatic changes or gastrointestinal issues. Possible contribution from hiatal hernia and gastritis noted in past imaging. - Prescribe Zofran  4mg  q8 hrs PRN  for nausea as needed - Prescribe Protonix  40 mg daily before meals  Gastroesophageal reflux disease and hiatal hernia chronic GERD and small hiatal hernia noted on previous imaging. Possible contribution to nausea symptoms. - Prescribe Protonix  40 mg daily before meals        History of malignant neoplasm of pancreas Pancreatic cancer with surgical intervention. No active chemotherapy. Regular follow-ups at [facility] [place]. Liver cysts noted, no active intervention required.      Return in about 6 weeks (around 03/28/2024).      Rockie Agent, MD  [facility] [phone] (phone) [phone] (fax)  [facility]

## 2024-02-15 NOTE — Assessment & Plan Note (Signed)
 Chronic  No current symptoms  Will monitor for intermittent low back pain

## 2024-02-15 NOTE — Assessment & Plan Note (Signed)
 Essential hypertension Chronic condition  Blood pressure recorded at 148/71 mmHg, recheck BP 148/60. Previous adjustment to losartan  dosage to 50 mg at night. Home monitoring of blood pressure is recommended. - Instruct to monitor blood pressure at home once daily, at least an hour after taking losartan  - Re-evaluate blood pressure control at next visit - continue losartan  50mg  daily

## 2024-02-15 NOTE — Assessment & Plan Note (Signed)
 Hypothyroidism Well-managed on Synthroid  125 mcg daily. Thyroid  levels are within normal range. - continue Synthorid 125mcg daily

## 2024-02-15 NOTE — Assessment & Plan Note (Signed)
 Noted during oncology evaluation  Follows with GI  F/u as scheduled

## 2024-02-15 NOTE — Patient Instructions (Addendum)
  VISIT SUMMARY: Today, you came in to establish care and discuss your ongoing health concerns, including dizziness, nausea, and your history of pancreatic cancer and hypothyroidism. We reviewed your symptoms and made plans for further evaluation and management.  YOUR PLAN: SYNCOPE AND DIZZINESS: You have been experiencing dizziness and a recent episode of fainting, especially with changes in position. -We will use a Zio patch to monitor your heart for 14 days to check for any irregularities. -Depending on the results, we may refer you to a cardiologist.  NAUSEA WITH POSITIONAL CHANGES: You have been feeling nauseous when bending over or changing positions. -We prescribed Zofran  to take as needed for nausea. -We also prescribed Protonix  40 mg to take daily before meals to help with any gastrointestinal issues.  GASTROESOPHAGEAL REFLUX DISEASE AND HIATAL HERNIA: You have a history of GERD and a small hiatal hernia, which may be contributing to your nausea. -We prescribed Protonix  40 mg to take daily before meals.  ESSENTIAL HYPERTENSION: Your blood pressure was recorded at 148/71 mmHg, and you are currently taking losartan  50 mg at night. -Please monitor your blood pressure at home once daily, at least an hour after taking your medication. -We will re-evaluate your blood pressure control at your next visit.  HYPOTHYROIDISM: Your hypothyroidism is well-managed with Synthroid  125 mcg daily, and your thyroid  levels are normal.  VITAMIN D  DEFICIENCY: Your Vitamin D  level was previously low, and you are taking Vitamin D3 2000 IU daily.  HISTORY OF MALIGNANT NEOPLASM OF PANCREAS: You have a history of pancreatic cancer and have had surgery. You are not currently on chemotherapy and have regular follow-ups.                      Contains text generated by [employer]bridge.                                 Contains text generated by [employer]bridge.

## 2024-02-15 NOTE — Assessment & Plan Note (Signed)
 Chronic  Start Protonix  40mg  daily  Noted to have small hiatal hernia in 2010  F/u with GI as scheduled for colonoscopy in Dec 2025

## 2024-02-15 NOTE — Assessment & Plan Note (Signed)
 Chronic stable  Patient reports symptoms in her toes  Continues dietary supplements for these symptoms

## 2024-02-15 NOTE — Assessment & Plan Note (Signed)
 Vitamin D  deficiency Chronic condition  Vitamin D  level previously low at 21.  - continu vitamin D3 2000 IU daily.

## 2024-02-23 ENCOUNTER — Inpatient Hospital Stay: Payer: Medicare Other | Attending: Oncology

## 2024-03-09 ENCOUNTER — Encounter: Admitting: Nurse Practitioner

## 2024-03-10 DIAGNOSIS — R55 Syncope and collapse: Secondary | ICD-10-CM | POA: Diagnosis not present

## 2024-03-11 DIAGNOSIS — R55 Syncope and collapse: Secondary | ICD-10-CM

## 2024-03-28 ENCOUNTER — Ambulatory Visit: Admitting: Family Medicine

## 2024-04-05 ENCOUNTER — Ambulatory Visit (INDEPENDENT_AMBULATORY_CARE_PROVIDER_SITE_OTHER): Admitting: Family Medicine

## 2024-04-05 ENCOUNTER — Encounter: Payer: Self-pay | Admitting: Family Medicine

## 2024-04-05 VITALS — BP 149/77 | HR 73 | Temp 98.8°F | Ht 62.0 in | Wt 138.2 lb

## 2024-04-05 DIAGNOSIS — R55 Syncope and collapse: Secondary | ICD-10-CM

## 2024-04-05 DIAGNOSIS — I471 Supraventricular tachycardia, unspecified: Secondary | ICD-10-CM | POA: Diagnosis not present

## 2024-04-05 DIAGNOSIS — I10 Essential (primary) hypertension: Secondary | ICD-10-CM | POA: Diagnosis not present

## 2024-04-05 NOTE — Assessment & Plan Note (Signed)
 Essential hypertension Chronic  Blood pressure is elevated at 162/76 in the office, but home readings are generally lower, around 144/73. She has been taking 100 mg of losartan  at bedtime instead of the prescribed 50 mg. There is a possibility of white coat hypertension, as home readings are consistently lower than office readings. The goal is to maintain systolic blood pressure under 849 and diastolic under 90 to avoid risks of hypotension and associated symptoms like dizziness or falls. - Continue losartan  100 mg at bedtime. - Advise checking blood pressure in the morning if taking medication at bedtime. - Monitor for persistent home readings of 160s/150s and return for evaluation if these occur. - Schedule follow-up in three months.

## 2024-04-05 NOTE — Progress Notes (Signed)
 Established patient visit   Patient: Lynn Taylor   DOB: Dec 08, 1945   78 y.o. Female  MRN: [id number] Visit Date: 04/05/2024  Today's healthcare provider: Rockie Agent, MD   Chief Complaint  Patient presents with   Medical Management of Chronic Issues    Patient is present for follow up, reports doing well today. No acute concerns    Hypertension    Patient brought wrist cuff from home and reading was 144/73 P 73   Subjective     HPI     Medical Management of Chronic Issues    Additional comments: Patient is present for follow up, reports doing well today. No acute concerns         Hypertension    Additional comments: Patient brought wrist cuff from home and reading was 144/73 P 73      Last edited by Cherry Chiquita HERO, CMA on 04/05/2024 11:07 AM.       Discussed the use of AI scribe software for clinical note transcription with the patient, who gave verbal consent to proceed.  History of Present Illness Lynn Taylor is a 78 year old female with chronic hypertension who presents with elevated blood pressure not at goal.  Her blood pressure was recorded at 162/76 in the office today. At home, her blood pressure readings have been around 144/73, with some readings as low as 127/72 and as high as 149/77. She has been taking losartan  50 mg daily, but recently realized she was taking two pills at bedtime instead of one, effectively doubling her dose to 100 mg. No symptoms of lightheadedness or dizziness despite the increased dose.  She reports that a recent heart monitor showed episodes where her heart rate was elevated, including ventricular tachycardia and supraventricular tachycardia. The ventricular tachycardia episode lasted four beats, and the supraventricular tachycardia episodes included a maximum heart rate of 176 beats per minute lasting for 20 beats. She attributes some of these episodes to physical activity, such as walking her dog.  No recent  episodes of syncope or feeling unwell.     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   Herniated disc, cervical 04/29/2019   History of deep vein thrombosis (DVT) of lower extremity 12/26/2021   Hyperbilirubinemia 04/28/2018   Hypertension    Hypomagnesemia 11/25/2018   Hypomagnesemia 11/25/2018   Hypothyroidism    Jaundice    Lumbar herniated disc 04/29/2019   Malignant neoplasm of head of pancreas (HCC) 06/08/2018   Malignant neoplasm of head of pancreas (HCC) 06/08/2018   Malignant neoplasm of head of pancreas (HCC) 06/08/2018   Personal history of chemotherapy 2019  Pancreatic cancer   Wears dentures    full upper and lower    Medications: Outpatient Medications Prior to Visit  Medication Sig   Amylase-Lipase-Protease (PANCREASE PO) Take by mouth. Takes 6 daily OTC   aspirin  (ASPIRIN  81) 81 MG EC tablet Take 2 tablets (162 mg total) by mouth daily. Swallow whole.   cetirizine (ZYRTEC) 10 MG tablet Take 10 mg by mouth daily.   Cholecalciferol  (VITAMIN D )  50 MCG (2000 UT) CAPS Take by mouth daily.   fluticasone  (FLONASE ) 50 MCG/ACT nasal spray Place 2 sprays into both nostrils daily. After nasal saline   levothyroxine  (SYNTHROID ) 125 MCG tablet Take 1 tablet (125 mcg total) by mouth daily.   losartan  (COZAAR ) 50 MG tablet Take 1 tablet (50 mg total) by mouth at bedtime.   Magnesium  Cl-Calcium  Carbonate (SLOW-MAG PO) Take by mouth. 2 QD   Multiple Vitamins-Minerals (CENTRUM SILVER 50+WOMEN PO) Take by mouth.   ondansetron  (ZOFRAN -ODT) 4 MG disintegrating tablet Take 1 tablet (4 mg total) by mouth every 8 (eight) hours as needed for nausea or vomiting.   pantoprazole  (PROTONIX ) 40 MG tablet Take 1 tablet (40 mg total) by mouth daily.   simethicone (MYLICON) 125 MG chewable tablet Chew 125 mg by mouth every 6 (six) hours as needed for flatulence.   TURMERIC CURCUMIN PO Take by mouth 3 (three) times daily with meals.   No facility-administered medications prior to visit.    Review of Systems  Last CBC Lab Results  Component Value Date   WBC 7.0 11/29/2023   HGB 11.3 (L) 11/29/2023   HCT 34.7 (L) 11/29/2023   MCV 87.2 11/29/2023   MCH 28.4 11/29/2023   RDW 15.6 (H) 11/29/2023   PLT 177 11/29/2023   Last metabolic panel Lab Results  Component Value Date   GLUCOSE 129 (H) 11/29/2023   NA 140 11/29/2023   K 4.1 11/29/2023   CL 106 11/29/2023   CO2 28 11/29/2023   BUN 19 11/29/2023   CREATININE 0.83 11/29/2023   GFRNONAA >60 11/29/2023   CALCIUM  9.5 11/29/2023   PROT 7.1 11/19/2023   ALBUMIN 4.1 11/19/2023   LABGLOB 3.8 04/21/2018   AGRATIO 1.0 (L) 04/21/2018   BILITOT 0.5 11/19/2023   ALKPHOS 50 11/19/2023   AST 16 11/19/2023   ALT 12 11/19/2023   ANIONGAP 6 11/29/2023   Last lipids Lab Results  Component Value Date   CHOL 180 11/19/2023   HDL 77.40 11/19/2023   LDLCALC 91 11/19/2023   TRIG 60.0 11/19/2023   CHOLHDL 2 11/19/2023        Objective    BP (!) 149/77 (Cuff Size: Normal) Comment: patient's wrist cuff in  office  Pulse 73   Temp 98.8 F (37.1 C) (Oral)   Ht 5' 2 (1.575 m)   Wt 138 lb 3.2 oz (62.7 kg)   SpO2 100%   BMI 25.28 kg/m  BP Readings from Last 3 Encounters:  04/05/24 (!) 149/77  02/15/24 (!) 148/62  12/01/23 138/76   Wt Readings from Last 3 Encounters:  04/05/24 138 lb 3.2 oz (62.7 kg)  02/15/24 137 lb 4.8 oz (62.3 kg)  12/01/23 139 lb 9.6 oz (63.3 kg)        Physical Exam Vitals reviewed.  Constitutional:      Appearance: She is well-developed.  Eyes:     Conjunctiva/sclera: Conjunctivae normal.  Cardiovascular:     Rate and Rhythm: Normal rate and regular rhythm.     Pulses: Normal pulses.  Heart sounds: Normal heart sounds.  Pulmonary:     Effort: Pulmonary effort is normal.     Breath sounds: Normal breath sounds. No wheezing, rhonchi or rales.  Musculoskeletal:     Right lower leg: No edema.     Left lower leg: No edema.  Skin:    General: Skin is warm and dry.  Neurological:     Mental Status: She is alert.  Psychiatric:        Speech: Speech normal.        Behavior: Behavior normal.        Thought Content: Thought content normal.       No results found for any visits on 04/05/24.  Assessment & Plan     Problem List Items Addressed This Visit     Essential hypertension - Primary   Essential hypertension Chronic  Blood pressure is elevated at 162/76 in the office, but home readings are generally lower, around 144/73. She has been taking 100 mg of losartan  at bedtime instead of the prescribed 50 mg. There is a possibility of white coat hypertension, as home readings are consistently lower than office readings. The goal is to maintain systolic blood pressure under 849 and diastolic under 90 to avoid risks of hypotension and associated symptoms like dizziness or falls. - Continue losartan  100 mg at bedtime. - Advise checking blood pressure in the morning if taking medication at bedtime. - Monitor for persistent home readings of 160s/150s and  return for evaluation if these occur. - Schedule follow-up in three months.      Syncope and collapse   Relevant Orders   Ambulatory referral to Cardiology   Other Visit Diagnoses       SVT (supraventricular tachycardia)       Relevant Orders   Ambulatory referral to Cardiology       Assessment and Plan Assessment & Plan Paroxysmal ventricular tachycardia and paroxysmal supraventricular tachycardia Intermittent  Hx of syncope  The heart monitor showed one run of ventricular tachycardia lasting four beats and 13 episodes of supraventricular tachycardia with a maximum heart rate of 176 lasting 20 beats. Awaiting final read from the cardiologist. She does not currently see a cardiologist. - Refer to cardiology for evaluation and management of ventricular and supraventricular tachycardia. - Await final read from cardiologist to determine if medication or other interventions are needed.       Return in about 3 months (around 07/06/2024) for HTN.         Rockie Agent, MD  [facility] [phone] (phone) [phone] (fax)  [facility]

## 2024-04-05 NOTE — Patient Instructions (Signed)
 To keep you healthy, please keep in mind the following health maintenance items that you are due for:   There are no preventive care reminders to display for this patient.   Best Wishes,   Dr. Lang

## 2024-04-19 ENCOUNTER — Inpatient Hospital Stay: Payer: Medicare Other

## 2024-04-19 ENCOUNTER — Encounter: Payer: Self-pay | Admitting: Family Medicine

## 2024-04-24 NOTE — Progress Notes (Unsigned)
 Cardiology Office Note   Date:  04/26/2024  ID:  Lynn Taylor, DOB 08-Feb-1946, MRN [id number] PCP: Sharma Coyer, MD  [facility] Providers Cardiologist:  Caron Poser, MD     History of Present Illness Lynn Taylor is a 78 y.o. female PMH HTN, pancreatic cancer status post Whipple and chemotherapy approximately 2021, CAC and aortic atherosclerosis seen on CT, who presents for syncope and SVT seen on a recent monitor.  Patient reports she is doing well.  She had a couple of syncopal episodes back in September.  She notes that both episodes only had very brief loss of consciousness.  She denies any prodrome or symptoms beforehand -no chest pain, palpitations, nausea, vomiting, or really any warning sign.  She had a recent monitor which showed a high burden of PACs and some SVT but no sustained VT, heart block, or other high risk arrhythmias.  Last LDL 91 11/2023.  Relevant CVD History -Monitor 03/2024 mean heart rate 74, 11.2% burden PACs, rare PVCs, 13 episodes SVT (longest 20 beats), 1 4 beat episode of NSVT.  No sustained arrhythmias. - CT chest 06/2018 CAC and aortic atherosclerosis -TTE 04/2018 normal biventricular function without significant valvular disease - Exercise stress SPECT 04/2018 normal perfusion, low risk   ROS: Pt denies any chest discomfort, jaw pain, arm pain, palpitations, orthopnea, PND, or LE edema.  Studies Reviewed I have independently reviewed the patient's ECG, previous cardiac testing, recent medical records, recent blood work.  Physical Exam VS:  BP (!) 146/70 (BP Location: Left Arm, Patient Position: Sitting, Cuff Size: Normal)   Pulse 67   Ht 5' 2 (1.575 m)   Wt 133 lb (60.3 kg)   SpO2 96%   BMI 24.33 kg/m   Orthostatic VS for the past 24 hrs (Last 3 readings):  BP- Lying Pulse- Lying BP- Sitting Pulse- Sitting BP- Standing at 0 minutes Pulse- Standing at 0 minutes BP- Standing at 3 minutes Pulse- Standing at 3 minutes   04/26/24 0919 170/74 66 168/69 71 168/73 69 173/74 67      Wt Readings from Last 3 Encounters:  04/26/24 133 lb (60.3 kg)  04/05/24 138 lb 3.2 oz (62.7 kg)  02/15/24 137 lb 4.8 oz (62.3 kg)    GEN: No acute distress. NECK: No JVD; No carotid bruits. CARDIAC: RRR, no murmurs, rubs, gallops. RESPIRATORY:  Clear to auscultation. EXTREMITIES:  Warm and well-perfused. No edema.  ASSESSMENT AND PLAN Syncope and collapse Patient presents for further evaluation of 2 isolated episodes of syncope that occurred back in September 2025.  She denies any prodrome preceding the syncopal episodes.  She has fortunately not had any recurrence since.  She had a recent monitor which showed frequent PACs and some SVT, but no high risk rhythms that would explain syncope such as sustained VT or heart block.  Orthostatic VS are normal. We still need to rule out structural heart disease as a cause.   Plan: - Echocardiogram to evaluate for structural causes  Paroxysmal tachycardia Paroxysmal SVT NSVT Recent monitor showing frequent PACs and occasional SVT that is not sustained.  There was a single 4 beat episode of NSVT.  As above, we will obtain an echocardiogram to further evaluate structural contributions.  She is fortunately asymptomatic from this, so we do not need to initiate any treatment at this time.  I asked her to reach out to me should she start having palpitations at which time we could start a low-dose of a beta-blocker.  CAC  Aortic atherosclerosis HLD Seen on prior CT chest 2020.  Last LDL 91 11/2023.  Given these findings, I would recommend an LDL goal less than 70.  I discussed this with her.  She is amenable to starting a low-dose of Crestor.  Plan: - Continue ASA 81 mg daily - Start Crestor 5 mg daily        Dispo: RTC 6 months or sooner as needed  Signed, Caron Poser, MD

## 2024-04-26 ENCOUNTER — Ambulatory Visit

## 2024-04-26 VITALS — BP 146/70 | HR 67 | Ht 62.0 in | Wt 133.0 lb

## 2024-04-26 DIAGNOSIS — R55 Syncope and collapse: Secondary | ICD-10-CM

## 2024-04-26 DIAGNOSIS — I479 Paroxysmal tachycardia, unspecified: Secondary | ICD-10-CM

## 2024-04-26 DIAGNOSIS — I4729 Other ventricular tachycardia: Secondary | ICD-10-CM

## 2024-04-26 DIAGNOSIS — I471 Supraventricular tachycardia, unspecified: Secondary | ICD-10-CM | POA: Diagnosis not present

## 2024-04-26 DIAGNOSIS — E782 Mixed hyperlipidemia: Secondary | ICD-10-CM

## 2024-04-26 DIAGNOSIS — I2510 Atherosclerotic heart disease of native coronary artery without angina pectoris: Secondary | ICD-10-CM

## 2024-04-26 DIAGNOSIS — I700 Atherosclerosis of aorta: Secondary | ICD-10-CM

## 2024-04-26 MED ORDER — ROSUVASTATIN CALCIUM 5 MG PO TABS: 5.0000 mg | ORAL_TABLET | Freq: Every day | ORAL | 3 refills | Status: AC

## 2024-04-26 NOTE — Patient Instructions (Signed)
 Medication Instructions:  Your physician recommends the following medication changes.  START TAKING: Rosuvastatin (CRESTOR) 5 mg by mouth daily  *If you need a refill on your cardiac medications before your next appointment, please call your pharmacy*  Lab Work: No labs ordered today  If you have labs (blood work) drawn today and your tests are completely normal, you will receive your results only by: MyChart Message (if you have MyChart) OR A paper copy in the mail If you have any lab test that is abnormal or we need to change your treatment, we will call you to review the results.  Testing/Procedures: Your physician has requested that you have an echocardiogram. Echocardiography is a painless test that uses sound waves to create images of your heart. It provides your doctor with information about the size and shape of your heart and how well your heart's chambers and valves are working.   You may receive an ultrasound enhancing agent through an IV if needed to better visualize your heart during the echo. This procedure takes approximately one hour.  There are no restrictions for this procedure.  This will take place at [address] [facility] [address], [place] [address]  Please note: We ask at that you not bring children with you during ultrasound (echo/ vascular) testing. Due to room size and safety concerns, children are not allowed in the ultrasound rooms during exams. Our front office staff cannot provide observation of children in our lobby area while testing is being conducted. An adult accompanying a patient to their appointment will only be allowed in the ultrasound room at the discretion of the ultrasound technician under special circumstances. We apologize for any inconvenience.   Follow-Up: At [facility], you and your health needs are our priority.  As part of our continuing mission to provide you with exceptional heart care, our providers are all  part of one team.  This team includes your primary Cardiologist (physician) and Advanced Practice Providers or APPs (Physician Assistants and Nurse Practitioners) who all work together to provide you with the care you need, when you need it.  Your next appointment:   6 month(s)  Provider:   Caron Poser, MD    We recommend signing up for the patient portal called MyChart.  Sign up information is provided on this After Visit Summary.  MyChart is used to connect with patients for Virtual Visits (Telemedicine).  Patients are able to view lab/test results, encounter notes, upcoming appointments, etc.  Non-urgent messages can be sent to your provider as well.   To learn more about what you can do with MyChart, go to [email or web address].

## 2024-05-03 ENCOUNTER — Encounter: Payer: Self-pay | Admitting: Family Medicine

## 2024-05-19 ENCOUNTER — Encounter: Payer: Self-pay | Admitting: Family Medicine

## 2024-05-20 ENCOUNTER — Other Ambulatory Visit: Payer: Self-pay | Admitting: Family Medicine

## 2024-05-20 ENCOUNTER — Encounter: Payer: Self-pay | Admitting: Family Medicine

## 2024-05-20 DIAGNOSIS — I10 Essential (primary) hypertension: Secondary | ICD-10-CM

## 2024-05-20 MED ORDER — LOSARTAN POTASSIUM 100 MG PO TABS: 100.0000 mg | ORAL_TABLET | Freq: Every evening | ORAL | 1 refills | Status: AC

## 2024-06-03 ENCOUNTER — Ambulatory Visit: Payer: Self-pay

## 2024-06-03 ENCOUNTER — Ambulatory Visit

## 2024-06-03 DIAGNOSIS — R55 Syncope and collapse: Secondary | ICD-10-CM | POA: Diagnosis not present

## 2024-06-03 DIAGNOSIS — I471 Supraventricular tachycardia, unspecified: Secondary | ICD-10-CM

## 2024-06-03 DIAGNOSIS — I4729 Other ventricular tachycardia: Secondary | ICD-10-CM | POA: Diagnosis not present

## 2024-06-03 LAB — ECHOCARDIOGRAM COMPLETE
AR max vel: 1.52 cm2
AV Area VTI: 1.46 cm2
AV Area mean vel: 1.36 cm2
AV Mean grad: 5.6 mmHg
AV Peak grad: 9.7 mmHg
Ao pk vel: 1.56 m/s
Area-P 1/2: 3.77 cm2
S' Lateral: 2.3 cm

## 2024-06-14 ENCOUNTER — Other Ambulatory Visit: Payer: Self-pay | Admitting: Family Medicine

## 2024-06-14 ENCOUNTER — Inpatient Hospital Stay: Payer: Medicare Other | Attending: Oncology

## 2024-06-14 DIAGNOSIS — K449 Diaphragmatic hernia without obstruction or gangrene: Secondary | ICD-10-CM

## 2024-06-14 DIAGNOSIS — Z8507 Personal history of malignant neoplasm of pancreas: Secondary | ICD-10-CM | POA: Diagnosis present

## 2024-06-16 ENCOUNTER — Ambulatory Visit: Admitting: Family Medicine

## 2024-07-06 NOTE — Patient Instructions (Signed)

## 2024-07-07 ENCOUNTER — Ambulatory Visit: Admitting: Family Medicine

## 2024-07-07 ENCOUNTER — Encounter: Payer: Self-pay | Admitting: Family Medicine

## 2024-07-07 VITALS — BP 174/78 | HR 70 | Ht 62.0 in | Wt 131.0 lb

## 2024-07-07 DIAGNOSIS — E039 Hypothyroidism, unspecified: Secondary | ICD-10-CM | POA: Diagnosis not present

## 2024-07-07 DIAGNOSIS — D6481 Anemia due to antineoplastic chemotherapy: Secondary | ICD-10-CM

## 2024-07-07 DIAGNOSIS — I10 Essential (primary) hypertension: Secondary | ICD-10-CM | POA: Diagnosis not present

## 2024-07-07 DIAGNOSIS — K219 Gastro-esophageal reflux disease without esophagitis: Secondary | ICD-10-CM

## 2024-07-07 DIAGNOSIS — Z1231 Encounter for screening mammogram for malignant neoplasm of breast: Secondary | ICD-10-CM | POA: Diagnosis not present

## 2024-07-07 DIAGNOSIS — E871 Hypo-osmolality and hyponatremia: Secondary | ICD-10-CM

## 2024-07-07 DIAGNOSIS — I700 Atherosclerosis of aorta: Secondary | ICD-10-CM

## 2024-07-07 DIAGNOSIS — E559 Vitamin D deficiency, unspecified: Secondary | ICD-10-CM

## 2024-07-07 DIAGNOSIS — T451X5A Adverse effect of antineoplastic and immunosuppressive drugs, initial encounter: Secondary | ICD-10-CM | POA: Diagnosis not present

## 2024-07-07 MED ORDER — SPIRONOLACTONE 25 MG PO TABS: ORAL_TABLET | ORAL | 3 refills | Status: AC

## 2024-07-07 NOTE — Progress Notes (Signed)
 "  Established Patient Office Visit  Patient ID: Lynn Taylor, female    DOB: 1946/04/19  Age: 79 y.o. MRN: [id number] PCP: Sharma Coyer, MD  Chief Complaint  Patient presents with   Medical Management of Chronic Issues    90mo f/u htn,     Subjective:     HPI  Discussed the use of AI scribe software for clinical note transcription with the patient, who gave verbal consent to proceed.  History of Present Illness Lynn Taylor is a 79 year old female with chronic hypertension who presents for a follow-up visit.  Her blood pressure reading today is 183/76. Typically, her systolic blood pressure ranges from 140 to 150 mmHg after taking her medication. She takes losartan  100 mg nightly for hypertension, but hydrochlorothiazide  was discontinued due to electrolyte abnormalities. She has not taken her blood pressure medication today, only her thyroid  medication.  She has a history of hyperthyroidism and is currently on levothyroxine  125 mcg daily. She took her thyroid  medication today but did not take her other medications due to timing with her breakfast.  She has chronic anemia and is taking ferrous sulfate 325 mg daily. No recent episodes of syncope or passing out.  She has a history of vitamin D  deficiency and is taking 2000 units of vitamin D  daily.  She has hyperlipidemia and is on Crestor  5 mg daily. She also has a history of atherosclerosis of the aorta and is taking aspirin  81 mg daily.  She has a history of reflux and is taking Protonix  40 mg daily.  She reports a good appetite and mentions that her weight has decreased slightly, which she attributes to changes in her eating schedule and less physical activity due to colder weather.  She has a port for infusions, which she uses once a year, and is considering having it removed.   Patient Active Problem List   Diagnosis Date Noted   Syncope and collapse 12/01/2023   Herpes labialis 07/27/2023   Port-A-Cath in  place 07/13/2023   Leg cramp 09/10/2022   History of pancreatic cancer 06/30/2022   Normocytic anemia 06/30/2022   Elevated vitamin B12 level 06/09/2022   Atherosclerosis of aorta 06/09/2022   Prediabetes 09/12/2020   Osteoarthritis 09/21/2019   DDD (degenerative disc disease), lumbar 04/29/2019   Liver cyst 04/29/2019   Emphysema lung (HCC) 04/29/2019   Polyneuropathy 04/29/2019   Anemia due to antineoplastic chemotherapy 11/25/2018   Hyponatremia 05/04/2018   Pancreas neoplasm    GERD (gastroesophageal reflux disease) 04/11/2018   Essential hypertension 05/20/2017   Acquired hypothyroidism 05/20/2017   Vitamin D  deficiency 05/20/2017   Past Medical History:  Diagnosis Date   Anemia due to antineoplastic chemotherapy 11/25/2018   Arthritis of shoulder region, right 04/29/2019   Bacterial conjunctivitis of right eye 08/21/2021   Bilateral carpal tunnel syndrome 04/29/2019   Biliary tract obstruction (HCC)    Cancer (HCC)    Decreased appetite 09/20/2018   Eye irritation 08/21/2021   Fitting and adjustment of gastrointestinal appliance and device    Genetic testing 08/31/2018   Negative genetic testing (no pathogenic variants identified) on the [facility] Multi-Cancer Panel. The Multi-Cancer Panel offered by [facility] includes sequencing and/or deletion duplication testing of the following 84 genes: AIP, ALK, APC, ATM, AXIN2,BAP1,  BARD1, BLM, BMPR1A, BRCA1, BRCA2, BRIP1, CASR, CDC73, CDH1, CDK4, CDKN1B, CDKN1C, CDKN2A (p14ARF), CDKN2A (p16INK4a), CEBPA, CHEK2, CTNNA1, DICER1   GERD (gastroesophageal reflux disease)    Goals of care, counseling/discussion 05/08/2018   Hand  arthropathy 06/14/2019   Herniated disc, cervical 04/29/2019   History of deep vein thrombosis (DVT) of lower extremity 12/26/2021   Hyperbilirubinemia 04/28/2018   Hypertension    Hypomagnesemia 11/25/2018   Hypomagnesemia 11/25/2018   Hypothyroidism    Jaundice    Lumbar herniated disc 04/29/2019    Malignant neoplasm of head of pancreas (HCC) 06/08/2018   Malignant neoplasm of head of pancreas (HCC) 06/08/2018   Malignant neoplasm of head of pancreas (HCC) 06/08/2018   Personal history of chemotherapy 2019    Pancreatic cancer   Wears dentures    full upper and lower      ROS    Objective:     BP (!) 174/78 (Patient Position: Sitting, Cuff Size: Normal)   Pulse 70   Ht 5' 2 (1.575 m)   Wt 131 lb (59.4 kg)   SpO2 98%   BMI 23.96 kg/m  BP Readings from Last 3 Encounters:  07/07/24 (!) 174/78  04/26/24 (!) 146/70  04/05/24 (!) 149/77   Wt Readings from Last 3 Encounters:  07/07/24 131 lb (59.4 kg)  04/26/24 133 lb (60.3 kg)  04/05/24 138 lb 3.2 oz (62.7 kg)      Physical Exam Vitals reviewed.  Constitutional:      General: She is not in acute distress.    Appearance: Normal appearance. She is not ill-appearing.  Cardiovascular:     Rate and Rhythm: Normal rate and regular rhythm.  Pulmonary:     Effort: Pulmonary effort is normal. No respiratory distress.     Breath sounds: No wheezing, rhonchi or rales.  Neurological:     Mental Status: She is alert and oriented to person, place, and time.  Psychiatric:        Mood and Affect: Mood normal.        Behavior: Behavior normal.      Results for orders placed or performed in visit on 07/07/24  Comprehensive metabolic panel with GFR  Result Value Ref Range   Glucose 105 (H) 70 - 99 mg/dL   BUN 10 8 - 27 mg/dL   Creatinine, Ser 9.26 0.57 - 1.00 mg/dL   eGFR 84 >40 fO/fpw/8.26   BUN/Creatinine Ratio 14 12 - 28   Sodium 140 134 - 144 mmol/L   Potassium 3.4 (L) 3.5 - 5.2 mmol/L   Chloride 100 96 - 106 mmol/L   CO2 25 20 - 29 mmol/L   Calcium  9.4 8.7 - 10.3 mg/dL   Total Protein 7.0 6.0 - 8.5 g/dL   Albumin 3.9 3.8 - 4.8 g/dL   Globulin, Total 3.1 1.5 - 4.5 g/dL   Bilirubin Total 0.5 0.0 - 1.2 mg/dL   Alkaline Phosphatase 100 49 - 135 IU/L   AST 31 0 - 40 IU/L   ALT 19 0 - 32 IU/L  CBC  w/Diff/Platelet  Result Value Ref Range   WBC 6.3 3.4 - 10.8 x10E3/uL   RBC 4.19 3.77 - 5.28 x10E6/uL   Hemoglobin 11.7 11.1 - 15.9 g/dL   Hematocrit 62.9 65.9 - 46.6 %   MCV 88 79 - 97 fL   MCH 27.9 26.6 - 33.0 pg   MCHC 31.6 31.5 - 35.7 g/dL   RDW 86.1 88.2 - 84.5 %   Platelets 218 150 - 450 x10E3/uL   Neutrophils 68 Not Estab. %   Lymphs 24 Not Estab. %   Monocytes 5 Not Estab. %   Eos 2 Not Estab. %   Basos 1 Not Estab. %  Neutrophils Absolute 4.3 1.4 - 7.0 x10E3/uL   Lymphocytes Absolute 1.5 0.7 - 3.1 x10E3/uL   Monocytes Absolute 0.3 0.1 - 0.9 x10E3/uL   EOS (ABSOLUTE) 0.1 0.0 - 0.4 x10E3/uL   Basophils Absolute 0.0 0.0 - 0.2 x10E3/uL   Immature Granulocytes 0 Not Estab. %   Immature Grans (Abs) 0.0 0.0 - 0.1 x10E3/uL  Vitamin D  (25 hydroxy)  Result Value Ref Range   Vit D, 25-Hydroxy 23.6 (L) 30.0 - 100.0 ng/mL  TSH + free T4  Result Value Ref Range   TSH 0.057 (L) 0.450 - 4.500 uIU/mL   Free T4 1.91 (H) 0.82 - 1.77 ng/dL    Last CBC Lab Results  Component Value Date   WBC 6.3 07/07/2024   HGB 11.7 07/07/2024   HCT 37.0 07/07/2024   MCV 88 07/07/2024   MCH 27.9 07/07/2024   RDW 13.8 07/07/2024   PLT 218 07/07/2024   Last metabolic panel Lab Results  Component Value Date   GLUCOSE 105 (H) 07/07/2024   NA 140 07/07/2024   K 3.4 (L) 07/07/2024   CL 100 07/07/2024   CO2 25 07/07/2024   BUN 10 07/07/2024   CREATININE 0.73 07/07/2024   GFRNONAA >60 11/29/2023   CALCIUM  9.4 07/07/2024   PROT 7.0 07/07/2024   ALBUMIN 3.9 07/07/2024   LABGLOB 3.1 07/07/2024   AGRATIO 1.0 (L) 04/21/2018   BILITOT 0.5 07/07/2024   ALKPHOS 100 07/07/2024   AST 31 07/07/2024   ALT 19 07/07/2024   ANIONGAP 6 11/29/2023   Last lipids Lab Results  Component Value Date   CHOL 180 11/19/2023   HDL 77.40 11/19/2023   LDLCALC 91 11/19/2023   TRIG 60.0 11/19/2023   CHOLHDL 2 11/19/2023   Last hemoglobin A1c Lab Results  Component Value Date   HGBA1C 6.3 01/21/2023    Last thyroid  functions Lab Results  Component Value Date   TSH 0.057 (L) 07/07/2024   T4TOTAL 10.3 06/16/2018   FREET4 1.91 (H) 07/07/2024   Last vitamin D  Lab Results  Component Value Date   VD25OH 23.6 (L) 07/07/2024   Last vitamin B12 and Folate Lab Results  Component Value Date   VITAMINB12 887 11/19/2023   FOLATE >23.8 09/10/2022      The 10-year ASCVD risk score (Arnett DK, et al., 2019) is: 27%  Outpatient Encounter Medications as of 07/07/2024  Medication Sig   Amylase-Lipase-Protease (PANCREASE PO) Take by mouth. Takes 6 daily OTC   Ascorbic Acid (VITAMIN C) 100 MG tablet Take 100 mg by mouth daily.   aspirin  (ASPIRIN  81) 81 MG EC tablet Take 2 tablets (162 mg total) by mouth daily. Swallow whole.   cetirizine (ZYRTEC) 10 MG tablet Take 10 mg by mouth daily. (Patient taking differently: Take 10 mg by mouth as needed.)   Cholecalciferol  (VITAMIN D ) 50 MCG (2000 UT) CAPS Take by mouth daily.   ferrous sulfate 325 (65 FE) MG EC tablet Take 325 mg by mouth daily with breakfast.   fluticasone  (FLONASE ) 50 MCG/ACT nasal spray Place 2 sprays into both nostrils daily. After nasal saline (Patient taking differently: Place into both nostrils as needed. After nasal saline)   levothyroxine  (SYNTHROID ) 125 MCG tablet Take 1 tablet (125 mcg total) by mouth daily.   losartan  (COZAAR ) 100 MG tablet Take 1 tablet (100 mg total) by mouth at bedtime.   Magnesium  Cl-Calcium  Carbonate (SLOW-MAG PO) Take by mouth. 2 QD   Multiple Vitamins-Minerals (CENTRUM SILVER 50+WOMEN PO) Take by mouth.   ondansetron  (ZOFRAN -ODT)  4 MG disintegrating tablet Take 1 tablet (4 mg total) by mouth every 8 (eight) hours as needed for nausea or vomiting.   pantoprazole  (PROTONIX ) 40 MG tablet Take 1 tablet by mouth once daily   rosuvastatin  (CRESTOR ) 5 MG tablet Take 1 tablet (5 mg total) by mouth daily.   simethicone (MYLICON) 125 MG chewable tablet Chew 125 mg by mouth every 6 (six) hours as needed for  flatulence.   spironolactone  (ALDACTONE ) 25 MG tablet Take 0.5 tablets (12.5 mg total) by mouth daily for 7 days, THEN 1 tablet (25 mg total) daily.   TURMERIC CURCUMIN PO Take by mouth 3 (three) times daily with meals.   zinc gluconate 50 MG tablet Take 50 mg by mouth daily.   No facility-administered encounter medications on file as of 07/07/2024.       Assessment & Plan:   Problem List Items Addressed This Visit     Acquired hypothyroidism   Chronic  Managed with levothyroxine  125 mcg daily. - Ordered TSH, T4, and T3 levels to monitor thyroid  function. -continue Synthroid  125mcg daily       Relevant Orders   TSH + free T4 (Completed)   Anemia due to antineoplastic chemotherapy   Chronic  Follows with Hematology  Continue ferrous sulfate supplementation       Relevant Orders   CBC w/Diff/Platelet (Completed)   Atherosclerosis of aorta   Atherosclerosis of aorta Chronic  Managed with aspirin  81 mg daily and Crestor  5 mg daily. Continue current regimen and follow up with cardiology       Relevant Medications   spironolactone  (ALDACTONE ) 25 MG tablet   Essential hypertension - Primary   Chronic  Hypertension remains uncontrolled with a blood pressure of 183/76 mmHg. Current regimen includes losartan  100 mg at bedtime. Hydrochlorothiazide  was discontinued due to electrolyte abnormalities. Blood pressure readings at home are typically 140-150 mmHg systolic. No episodes of dizziness or syncope reported. - Added spironolactone  12.5 mg daily in the morning to manage blood pressure and monitor for side effects such as lightheadedness or dizziness. - If blood pressure remains high after one week, increase spironolactone  to 25 mg daily. - Rechecked blood pressure during the visit. - Scheduled follow-up in three months to assess blood pressure control.      Relevant Medications   spironolactone  (ALDACTONE ) 25 MG tablet   Other Relevant Orders   Comprehensive metabolic panel  with GFR (Completed)   GERD (gastroesophageal reflux disease)   Chronic  Start Protonix  40mg  daily  Noted to have small hiatal hernia in 2010  Follows with GI        Hyponatremia   Chronic  Check metabolic panel today       Vitamin D  deficiency   Vitamin D  deficiency Chronic  Managed with cholecalciferol  2000 units daily. - Ordered vitamin D  level to assess current status.      Relevant Orders   Vitamin D  (25 hydroxy) (Completed)   Other Visit Diagnoses       Encounter for screening mammogram for malignant neoplasm of breast       Relevant Orders   MM 3D SCREENING MAMMOGRAM BILATERAL BREAST       Assessment and Plan Assessment & Plan  General Health Maintenance Routine health maintenance discussed. - Ordered mammogram for breast cancer screening.    Return in about 3 months (around 10/05/2024) for HTN.    Rockie Agent, MD [facility]  "

## 2024-07-08 ENCOUNTER — Ambulatory Visit: Payer: Self-pay | Admitting: Family Medicine

## 2024-07-08 DIAGNOSIS — E876 Hypokalemia: Secondary | ICD-10-CM

## 2024-07-08 DIAGNOSIS — E039 Hypothyroidism, unspecified: Secondary | ICD-10-CM

## 2024-07-08 LAB — CBC WITH DIFFERENTIAL/PLATELET
Basophils Absolute: 0 10*3/uL (ref 0.0–0.2)
Basos: 1 %
EOS (ABSOLUTE): 0.1 10*3/uL (ref 0.0–0.4)
Eos: 2 %
Hematocrit: 37 % (ref 34.0–46.6)
Hemoglobin: 11.7 g/dL (ref 11.1–15.9)
Immature Grans (Abs): 0 10*3/uL (ref 0.0–0.1)
Immature Granulocytes: 0 %
Lymphocytes Absolute: 1.5 10*3/uL (ref 0.7–3.1)
Lymphs: 24 %
MCH: 27.9 pg (ref 26.6–33.0)
MCHC: 31.6 g/dL (ref 31.5–35.7)
MCV: 88 fL (ref 79–97)
Monocytes Absolute: 0.3 10*3/uL (ref 0.1–0.9)
Monocytes: 5 %
Neutrophils Absolute: 4.3 10*3/uL (ref 1.4–7.0)
Neutrophils: 68 %
Platelets: 218 10*3/uL (ref 150–450)
RBC: 4.19 x10E6/uL (ref 3.77–5.28)
RDW: 13.8 % (ref 11.7–15.4)
WBC: 6.3 10*3/uL (ref 3.4–10.8)

## 2024-07-08 LAB — COMPREHENSIVE METABOLIC PANEL WITH GFR
ALT: 19 [IU]/L (ref 0–32)
AST: 31 [IU]/L (ref 0–40)
Albumin: 3.9 g/dL (ref 3.8–4.8)
Alkaline Phosphatase: 100 [IU]/L (ref 49–135)
BUN/Creatinine Ratio: 14 (ref 12–28)
BUN: 10 mg/dL (ref 8–27)
Bilirubin Total: 0.5 mg/dL (ref 0.0–1.2)
CO2: 25 mmol/L (ref 20–29)
Calcium: 9.4 mg/dL (ref 8.7–10.3)
Chloride: 100 mmol/L (ref 96–106)
Creatinine, Ser: 0.73 mg/dL (ref 0.57–1.00)
Globulin, Total: 3.1 g/dL (ref 1.5–4.5)
Glucose: 105 mg/dL — ABNORMAL HIGH (ref 70–99)
Potassium: 3.4 mmol/L — ABNORMAL LOW (ref 3.5–5.2)
Sodium: 140 mmol/L (ref 134–144)
Total Protein: 7 g/dL (ref 6.0–8.5)
eGFR: 84 mL/min/{1.73_m2}

## 2024-07-08 LAB — TSH+FREE T4
Free T4: 1.91 ng/dL — ABNORMAL HIGH (ref 0.82–1.77)
TSH: 0.057 u[IU]/mL — ABNORMAL LOW (ref 0.450–4.500)

## 2024-07-08 LAB — VITAMIN D 25 HYDROXY (VIT D DEFICIENCY, FRACTURES): Vit D, 25-Hydroxy: 23.6 ng/mL — ABNORMAL LOW (ref 30.0–100.0)

## 2024-07-08 MED ORDER — LEVOTHYROXINE SODIUM 100 MCG PO TABS: 100.0000 ug | ORAL_TABLET | Freq: Every day | ORAL | 3 refills | Status: AC

## 2024-07-08 MED ORDER — POTASSIUM CHLORIDE CRYS ER 20 MEQ PO TBCR: 20.0000 meq | EXTENDED_RELEASE_TABLET | Freq: Every day | ORAL | 0 refills | Status: AC

## 2024-07-08 NOTE — Assessment & Plan Note (Signed)
 Vitamin D  deficiency Chronic  Managed with cholecalciferol  2000 units daily. - Ordered vitamin D  level to assess current status.

## 2024-07-08 NOTE — Assessment & Plan Note (Signed)
 Chronic  Hypertension remains uncontrolled with a blood pressure of 183/76 mmHg. Current regimen includes losartan  100 mg at bedtime. Hydrochlorothiazide  was discontinued due to electrolyte abnormalities. Blood pressure readings at home are typically 140-150 mmHg systolic. No episodes of dizziness or syncope reported. - Added spironolactone  12.5 mg daily in the morning to manage blood pressure and monitor for side effects such as lightheadedness or dizziness. - If blood pressure remains high after one week, increase spironolactone  to 25 mg daily. - Rechecked blood pressure during the visit. - Scheduled follow-up in three months to assess blood pressure control.

## 2024-07-08 NOTE — Assessment & Plan Note (Signed)
 Chronic  Check metabolic panel today

## 2024-07-08 NOTE — Assessment & Plan Note (Addendum)
 Chronic  Follows with Hematology  Continue ferrous sulfate supplementation

## 2024-07-08 NOTE — Assessment & Plan Note (Signed)
 Chronic  Managed with levothyroxine  125 mcg daily. - Ordered TSH, T4, and T3 levels to monitor thyroid  function. -continue Synthroid  125mcg daily

## 2024-07-10 ENCOUNTER — Telehealth: Payer: Self-pay | Admitting: Oncology

## 2024-07-10 NOTE — Telephone Encounter (Signed)
 Due to weather, I spoke with pt and changed in person visit to virtual visit on 2/2.

## 2024-07-11 ENCOUNTER — Inpatient Hospital Stay: Attending: Oncology | Admitting: Oncology

## 2024-07-11 DIAGNOSIS — D649 Anemia, unspecified: Secondary | ICD-10-CM | POA: Diagnosis not present

## 2024-07-11 DIAGNOSIS — Z8507 Personal history of malignant neoplasm of pancreas: Secondary | ICD-10-CM | POA: Diagnosis not present

## 2024-07-11 DIAGNOSIS — Z95828 Presence of other vascular implants and grafts: Secondary | ICD-10-CM

## 2024-07-11 NOTE — Assessment & Plan Note (Signed)
#  Stage IIB pancreatic cancer status post Whipple resection [05/25/18]. Status post 7 out of the 12 planned cycles of mFOLFIRINOX with difficulties and have decided not to proceed with additional chemo.No evidence of metastatic disease on recent CT scan done at [facility]. CA19.9  is stable.  Continue to follow-up for surveillance.  She is 6 years post surgery.[Dec 2019]  Nov CT scan at [facility] shows no recurrence.  She will see [facility] in 12 months with plan of repeat CT She will follow up with me as needed.

## 2024-07-12 ENCOUNTER — Telehealth: Payer: Self-pay

## 2024-07-12 ENCOUNTER — Encounter: Payer: Self-pay | Admitting: Family Medicine

## 2024-07-12 ENCOUNTER — Inpatient Hospital Stay: Payer: Medicare Other | Attending: Oncology | Admitting: Oncology

## 2024-07-12 ENCOUNTER — Telehealth (INDEPENDENT_AMBULATORY_CARE_PROVIDER_SITE_OTHER): Payer: Self-pay

## 2024-07-12 NOTE — Telephone Encounter (Signed)
 Pt scheduled for port a cath removal on 07/21/24

## 2024-07-12 NOTE — Telephone Encounter (Signed)
 Request for port removal sent to [facility].

## 2024-07-13 ENCOUNTER — Ambulatory Visit: Admitting: Family Medicine

## 2024-07-13 ENCOUNTER — Encounter: Payer: Self-pay | Admitting: Family Medicine

## 2024-07-13 DIAGNOSIS — I10 Essential (primary) hypertension: Secondary | ICD-10-CM

## 2024-07-13 DIAGNOSIS — E039 Hypothyroidism, unspecified: Secondary | ICD-10-CM

## 2024-07-13 DIAGNOSIS — E559 Vitamin D deficiency, unspecified: Secondary | ICD-10-CM

## 2024-07-13 NOTE — Patient Instructions (Addendum)
-  Take levothyroxine  first thing in the morning on a empty stomach 30-60 minutes before eating. -1 hour after consuming take your spironolactone  and protonix . (Please take 1/2 spironolactone  tablet for 7 days and then increase to 1 tablet) -Take magnesium  chloride + Calcium  4 hours after  -Take ferrous sulfate 325 mg (iron ) at lunch -Take rosuvastatin  5 mg at night  -Take all other medications as prescribed throughout the day

## 2024-07-13 NOTE — Progress Notes (Signed)
 Patient presented for nurse visit to help with organizing medication.  I did not evaluate the patient.  I reviewed medications and advised on which medications could be taken in the morning, afternoon and at bedtime   Severo Champ, CMA reviewed medications with the patient

## 2024-07-21 ENCOUNTER — Encounter: Admission: RE | Payer: Self-pay

## 2024-07-21 ENCOUNTER — Ambulatory Visit: Admission: RE | Admit: 2024-07-21 | Admitting: Vascular Surgery

## 2024-07-21 DIAGNOSIS — D490 Neoplasm of unspecified behavior of digestive system: Secondary | ICD-10-CM

## 2024-08-11 ENCOUNTER — Encounter

## 2024-10-12 ENCOUNTER — Ambulatory Visit
